# Patient Record
Sex: Male | Born: 1935 | Race: White | Hispanic: No | Marital: Married | Smoking: Never smoker
Health system: Southern US, Community
[De-identification: ages and names within clinical notes are randomized; demographics above are authoritative.]

## PROBLEM LIST (undated history)

## (undated) DIAGNOSIS — K5790 Diverticulosis of intestine, part unspecified, without perforation or abscess without bleeding: Secondary | ICD-10-CM

## (undated) DIAGNOSIS — Z950 Presence of cardiac pacemaker: Secondary | ICD-10-CM

## (undated) DIAGNOSIS — I10 Essential (primary) hypertension: Secondary | ICD-10-CM

## (undated) DIAGNOSIS — Z7901 Long term (current) use of anticoagulants: Secondary | ICD-10-CM

## (undated) DIAGNOSIS — N200 Calculus of kidney: Secondary | ICD-10-CM

## (undated) DIAGNOSIS — F32A Depression, unspecified: Secondary | ICD-10-CM

## (undated) DIAGNOSIS — M1990 Unspecified osteoarthritis, unspecified site: Secondary | ICD-10-CM

## (undated) DIAGNOSIS — I480 Paroxysmal atrial fibrillation: Secondary | ICD-10-CM

## (undated) DIAGNOSIS — K4090 Unilateral inguinal hernia, without obstruction or gangrene, not specified as recurrent: Secondary | ICD-10-CM

## (undated) DIAGNOSIS — I77810 Thoracic aortic ectasia: Secondary | ICD-10-CM

## (undated) DIAGNOSIS — E785 Hyperlipidemia, unspecified: Secondary | ICD-10-CM

## (undated) DIAGNOSIS — I4891 Unspecified atrial fibrillation: Secondary | ICD-10-CM

## (undated) DIAGNOSIS — N400 Enlarged prostate without lower urinary tract symptoms: Secondary | ICD-10-CM

## (undated) DIAGNOSIS — R1313 Dysphagia, pharyngeal phase: Secondary | ICD-10-CM

## (undated) DIAGNOSIS — I700 Atherosclerosis of aorta: Secondary | ICD-10-CM

## (undated) DIAGNOSIS — I4729 Other ventricular tachycardia: Secondary | ICD-10-CM

## (undated) DIAGNOSIS — K219 Gastro-esophageal reflux disease without esophagitis: Secondary | ICD-10-CM

## (undated) DIAGNOSIS — I509 Heart failure, unspecified: Secondary | ICD-10-CM

## (undated) DIAGNOSIS — L309 Dermatitis, unspecified: Secondary | ICD-10-CM

## (undated) DIAGNOSIS — I4510 Unspecified right bundle-branch block: Secondary | ICD-10-CM

## (undated) DIAGNOSIS — I2510 Atherosclerotic heart disease of native coronary artery without angina pectoris: Secondary | ICD-10-CM

## (undated) HISTORY — DX: Dermatitis, unspecified: L30.9

## (undated) HISTORY — DX: Atherosclerotic heart disease of native coronary artery without angina pectoris: I25.10

## (undated) HISTORY — PX: BLEPHAROPLASTY: SUR158

## (undated) HISTORY — PX: MANDIBLE SURGERY: SHX707

## (undated) HISTORY — DX: Essential (primary) hypertension: I10

## (undated) HISTORY — DX: Paroxysmal atrial fibrillation: I48.0

## (undated) HISTORY — DX: Heart failure, unspecified: I50.9

## (undated) HISTORY — DX: Unspecified osteoarthritis, unspecified site: M19.90

## (undated) HISTORY — DX: Hyperlipidemia, unspecified: E78.5

## (undated) HISTORY — PX: TOTAL SHOULDER REPLACEMENT: SUR1217

## (undated) HISTORY — PX: CHOLECYSTECTOMY: SHX55

## (undated) HISTORY — DX: Presence of cardiac pacemaker: Z95.0

## (undated) HISTORY — DX: Unspecified atrial fibrillation: I48.91

## (undated) HISTORY — PX: TONSILLECTOMY: SUR1361

## (undated) HISTORY — PX: HERNIA REPAIR: SHX51

---

## 1964-09-24 HISTORY — PX: MANDIBLE SURGERY: SHX707

## 1975-09-25 HISTORY — PX: HERNIA REPAIR: SHX51

## 1975-09-25 HISTORY — PX: INGUINAL HERNIA REPAIR: SHX194

## 1995-09-25 HISTORY — PX: CARDIAC CATHETERIZATION: SHX172

## 1995-09-25 HISTORY — PX: CORONARY ANGIOPLASTY: SHX604

## 1998-09-24 HISTORY — PX: CARDIAC CATHETERIZATION: SHX172

## 2005-09-24 DIAGNOSIS — Z950 Presence of cardiac pacemaker: Secondary | ICD-10-CM

## 2005-09-24 DIAGNOSIS — I495 Sick sinus syndrome: Secondary | ICD-10-CM

## 2005-09-24 HISTORY — DX: Presence of cardiac pacemaker: Z95.0

## 2005-09-24 HISTORY — DX: Sick sinus syndrome: I49.5

## 2005-12-04 DIAGNOSIS — IMO0001 Reserved for inherently not codable concepts without codable children: Secondary | ICD-10-CM | POA: Insufficient documentation

## 2005-12-23 HISTORY — PX: INSERT / REPLACE / REMOVE PACEMAKER: SUR710

## 2010-09-24 HISTORY — PX: COLONOSCOPY: SHX174

## 2010-10-25 DIAGNOSIS — Z85828 Personal history of other malignant neoplasm of skin: Secondary | ICD-10-CM | POA: Insufficient documentation

## 2010-11-29 DIAGNOSIS — M1710 Unilateral primary osteoarthritis, unspecified knee: Secondary | ICD-10-CM | POA: Insufficient documentation

## 2012-09-05 DIAGNOSIS — H524 Presbyopia: Secondary | ICD-10-CM | POA: Insufficient documentation

## 2012-09-05 DIAGNOSIS — H43399 Other vitreous opacities, unspecified eye: Secondary | ICD-10-CM | POA: Insufficient documentation

## 2012-09-05 DIAGNOSIS — H04129 Dry eye syndrome of unspecified lacrimal gland: Secondary | ICD-10-CM | POA: Insufficient documentation

## 2012-09-05 DIAGNOSIS — H5210 Myopia, unspecified eye: Secondary | ICD-10-CM | POA: Insufficient documentation

## 2013-01-20 DIAGNOSIS — I872 Venous insufficiency (chronic) (peripheral): Secondary | ICD-10-CM | POA: Insufficient documentation

## 2013-01-20 DIAGNOSIS — F32A Depression, unspecified: Secondary | ICD-10-CM | POA: Insufficient documentation

## 2013-03-04 ENCOUNTER — Emergency Department: Payer: Self-pay | Admitting: Internal Medicine

## 2013-03-04 LAB — COMPREHENSIVE METABOLIC PANEL
Alkaline Phosphatase: 81 U/L (ref 50–136)
Anion Gap: 6 — ABNORMAL LOW (ref 7–16)
Bilirubin,Total: 1.4 mg/dL — ABNORMAL HIGH (ref 0.2–1.0)
Chloride: 106 mmol/L (ref 98–107)
Co2: 27 mmol/L (ref 21–32)
Creatinine: 1.07 mg/dL (ref 0.60–1.30)
Glucose: 93 mg/dL (ref 65–99)
Osmolality: 279 (ref 275–301)
SGPT (ALT): 35 U/L (ref 12–78)
Total Protein: 7 g/dL (ref 6.4–8.2)

## 2013-03-04 LAB — CBC
HGB: 15.2 g/dL (ref 13.0–18.0)
MCH: 33.5 pg (ref 26.0–34.0)
MCHC: 35 g/dL (ref 32.0–36.0)
Platelet: 220 10*3/uL (ref 150–440)
RBC: 4.55 10*6/uL (ref 4.40–5.90)

## 2013-03-04 LAB — URINALYSIS, COMPLETE
Bacteria: NONE SEEN
Nitrite: NEGATIVE
Ph: 6 (ref 4.5–8.0)
Protein: NEGATIVE
Specific Gravity: 1.014 (ref 1.003–1.030)
Squamous Epithelial: NONE SEEN
WBC UR: 2 /HPF (ref 0–5)

## 2013-03-04 LAB — CK TOTAL AND CKMB (NOT AT ARMC): CK-MB: 1.3 ng/mL (ref 0.5–3.6)

## 2013-03-12 ENCOUNTER — Ambulatory Visit (INDEPENDENT_AMBULATORY_CARE_PROVIDER_SITE_OTHER): Payer: Medicare Other | Admitting: Cardiovascular Disease

## 2013-03-12 ENCOUNTER — Encounter: Payer: Self-pay | Admitting: Cardiovascular Disease

## 2013-03-12 VITALS — BP 110/80 | HR 72 | Ht 72.0 in | Wt 187.2 lb

## 2013-03-12 DIAGNOSIS — I10 Essential (primary) hypertension: Secondary | ICD-10-CM

## 2013-03-12 DIAGNOSIS — Z950 Presence of cardiac pacemaker: Secondary | ICD-10-CM

## 2013-03-12 DIAGNOSIS — I4891 Unspecified atrial fibrillation: Secondary | ICD-10-CM | POA: Insufficient documentation

## 2013-03-12 DIAGNOSIS — R0602 Shortness of breath: Secondary | ICD-10-CM | POA: Insufficient documentation

## 2013-03-12 DIAGNOSIS — I2510 Atherosclerotic heart disease of native coronary artery without angina pectoris: Secondary | ICD-10-CM | POA: Insufficient documentation

## 2013-03-12 DIAGNOSIS — E785 Hyperlipidemia, unspecified: Secondary | ICD-10-CM | POA: Insufficient documentation

## 2013-03-12 DIAGNOSIS — I25118 Atherosclerotic heart disease of native coronary artery with other forms of angina pectoris: Secondary | ICD-10-CM | POA: Insufficient documentation

## 2013-03-12 NOTE — Assessment & Plan Note (Signed)
He does have mild shortness of breath. Wife does not seem to 3 concerned with his symptoms. We have encouraged him to stay on his Lasix as he does have continued pitting edema of bilateral lower extremities consistent with diastolic CHF. I've asked him to watch his fluid intake. If short of breath symptoms get worse or do not improve to their baseline, options would include stress testing, repeat echocardiogram. We have discussed this with him. He will call us if symptoms get worse.

## 2013-03-12 NOTE — Progress Notes (Signed)
Patient ID: Kyle Morales, male    DOB: 1936-04-20, 77 y.o.   MRN: [id number]  HPI Comments: [name] is a very pleasant 77 year old gentleman, patient of Dr. Ferdinand Cava, who lives at [facility] [place], who presents to establish care in the office. He is a history of paroxysmal atrial fibrillation dating back to 63. He had diagnosis of coronary artery disease and stent placement at [facility] in 1997, pacemaker placement in [place] in 2007 for diagnosis of atrial fibrillation, heart block. At that time ejection fraction was normal.   He states that he continues to have paroxysmal atrial fibrillation. On last EP evaluation in December 2013 at [facility], he had 3.1% atrial fibrillation burden, increased from 1.4%. He has been on the Toprol for a long time. Notes also indicate history of nonsustained VT. Metoprolol dose was increased from 25-100 mg daily.  Last stress test was 3-4 years ago. He is unaware of the results but believes it was fine/ In May he was traveling, could not take his medications on regular basis and developed significant lower extremity edema. 03/04/2013 he presented to the emergency room atrial fibrillation, palpitations. He was given IV fluids and he believes that this converted him back to normal sinus rhythm. His biggest concern is the CT scan showing right lower lobe pneumonia versus mass lesion. He was given several days of antibiotics and instructed to have followup imaging for this finding in the lower lobe.  He has been back on his Lasix after stopping the medication through may. Edema has improved but was significant at the end of May. He continues to have edema worse on the right than the left. He denies any further episodes of atrial fibrillation since his recent hospital visit June 11. He continues to have mild shortness of breath. He feels that this is a chronic issue, possibly Mildly worse than his baseline. He is uncertain if this is from a cardiac issue or from not being on his  Lasix through may 2014.  Notes from [facility] indicate stent placed to his ramus Stress test June 2009 with a normal study, ejection fraction 65%  EKG today shows normal sinus rhythm with right bundle branch block, APCs, nonspecific T wave abnormality in the anterior precordial leads   Outpatient Encounter Prescriptions as of 03/12/2013  Medication Sig Dispense Refill  . aspirin 81 MG tablet Take 81 mg by mouth daily.      . Furosemide (LASIX PO) Take 120 mg by mouth daily.       . Losartan Potassium (COZAAR PO) Take 100 mg by mouth daily.       Marland Kitchen lovastatin (MEVACOR) 20 MG tablet Take 20 mg by mouth at bedtime.      . metoprolol succinate (TOPROL-XL) 100 MG 24 hr tablet Take 100 mg by mouth daily. Take with or immediately following a meal.      . Multiple Vitamin (MULTIVITAMIN) tablet Take 1 tablet by mouth daily.      . potassium chloride (K-DUR) 10 MEQ tablet Take 20 mEq by mouth daily.       . Warfarin Sodium (COUMADIN PO) Take 3 mg by mouth as directed.       . [DISCONTINUED] PRAVASTATIN SODIUM PO Take by mouth.       No facility-administered encounter medications on file as of 03/12/2013.     Review of Systems  Constitutional: Negative.   HENT: Negative.   Eyes: Negative.   Respiratory: Positive for shortness of breath.   Cardiovascular: Negative.  Gastrointestinal: Negative.   Musculoskeletal: Negative.   Skin: Negative.   Neurological: Negative.   Psychiatric/Behavioral: Negative.   All other systems reviewed and are negative.    BP 110/80  Pulse 72  Ht 6' (1.829 m)  Wt 187 lb 4 oz (84.936 kg)  BMI 25.39 kg/m2  Physical Exam  Nursing note and vitals reviewed. Constitutional: He is oriented to person, place, and time. He appears well-developed and well-nourished.  Elderly gentleman  HENT:  Head: Normocephalic.  Nose: Nose normal.  Mouth/Throat: Oropharynx is clear and moist.  Eyes: Conjunctivae are normal. Pupils are equal, round, and reactive to light.  Neck:  Normal range of motion. Neck supple. No JVD present.  Cardiovascular: Normal rate, regular rhythm, S1 normal, S2 normal and intact distal pulses.  Exam reveals no gallop and no friction rub.   Murmur heard.  Systolic murmur is present with a grade of 2/6  Pulmonary/Chest: Effort normal and breath sounds normal. No respiratory distress. He has no wheezes. He has no rales. He exhibits no tenderness.  Mild rales at the bases  Abdominal: Soft. Bowel sounds are normal. He exhibits no distension. There is no tenderness.  Musculoskeletal: Normal range of motion. He exhibits edema. He exhibits no tenderness.  Lymphadenopathy:    He has no cervical adenopathy.  Neurological: He is alert and oriented to person, place, and time. Coordination normal.  Skin: Skin is warm and dry. No rash noted. No erythema.  Psychiatric: He has a normal mood and affect. His behavior is normal. Judgment and thought content normal.      Assessment and Plan

## 2013-03-12 NOTE — Assessment & Plan Note (Signed)
Recent episode of atrial fibrillation. Would recommend he stay in close followup with EP whether this is at [facility] or in [place]. Given underlying CAD in the past, and history of nonsustained VT , he  may be a candidate for other antiarrhythmics in addition to his metoprolol.

## 2013-03-12 NOTE — Patient Instructions (Addendum)
You are doing well. No medication changes were made.  We will try to obtain records from [facility]  Please call us if you have new issues that need to be addressed before your next appt.  Your physician wants you to follow-up in: 6 months.  You will receive a reminder letter in the mail two months in advance. If you don't receive a letter, please call our office to schedule the follow-up appointment.

## 2013-03-12 NOTE — Assessment & Plan Note (Signed)
Blood pressure is well controlled on today's visit. No changes made to the medications. 

## 2013-03-12 NOTE — Assessment & Plan Note (Signed)
Notes indicate prior stent to the ramus. We have suggested if he has worsening symptoms of shortness of breath or other concerns, repeat stress testing could be performed to exclude underlying coronary disease/ischemia.

## 2013-03-12 NOTE — Assessment & Plan Note (Signed)
Goal LDL less than 70, total cholesterol less than 150 given underlying CAD.

## 2013-03-12 NOTE — Assessment & Plan Note (Signed)
He will discuss this with Dr. Alvester Morin and let us know if you would like to establish care with EP in our office.

## 2013-04-29 ENCOUNTER — Other Ambulatory Visit: Payer: Self-pay

## 2013-06-02 DIAGNOSIS — R42 Dizziness and giddiness: Secondary | ICD-10-CM | POA: Insufficient documentation

## 2013-07-30 ENCOUNTER — Other Ambulatory Visit: Payer: Self-pay

## 2013-09-08 ENCOUNTER — Encounter: Payer: Self-pay | Admitting: Cardiovascular Disease

## 2013-09-08 ENCOUNTER — Ambulatory Visit (INDEPENDENT_AMBULATORY_CARE_PROVIDER_SITE_OTHER): Payer: Medicare Other | Admitting: Cardiovascular Disease

## 2013-09-08 VITALS — BP 110/70 | HR 87 | Ht 71.0 in | Wt 188.2 lb

## 2013-09-08 DIAGNOSIS — I10 Essential (primary) hypertension: Secondary | ICD-10-CM

## 2013-09-08 DIAGNOSIS — Z950 Presence of cardiac pacemaker: Secondary | ICD-10-CM

## 2013-09-08 DIAGNOSIS — I5032 Chronic diastolic (congestive) heart failure: Secondary | ICD-10-CM

## 2013-09-08 DIAGNOSIS — I509 Heart failure, unspecified: Secondary | ICD-10-CM

## 2013-09-08 DIAGNOSIS — E785 Hyperlipidemia, unspecified: Secondary | ICD-10-CM

## 2013-09-08 DIAGNOSIS — I4891 Unspecified atrial fibrillation: Secondary | ICD-10-CM

## 2013-09-08 DIAGNOSIS — I2510 Atherosclerotic heart disease of native coronary artery without angina pectoris: Secondary | ICD-10-CM

## 2013-09-08 MED ORDER — LOSARTAN POTASSIUM 50 MG PO TABS: 50.0000 mg | ORAL_TABLET | Freq: Every day | ORAL | Status: DC

## 2013-09-08 NOTE — Assessment & Plan Note (Signed)
Encouraged him to stay on his metoprolol succinate 50 mg twice a day. No recent arrhythmia episodes per the patient.

## 2013-09-08 NOTE — Assessment & Plan Note (Signed)
With recent weight loss, blood pressure running on the low end. Suggested he cut his losartan in half. Recent balance issues. We'll make sure he is not overmedicated.

## 2013-09-08 NOTE — Assessment & Plan Note (Signed)
Cholesterol is at goal on the current lipid regimen. No changes to the medications were made.  

## 2013-09-08 NOTE — Assessment & Plan Note (Signed)
Followed at [facility] 

## 2013-09-08 NOTE — Progress Notes (Signed)
Patient ID: Kyle Morales, male    DOB: 09/30/1935, 77 y.o.   MRN: [id number]  HPI Comments: Kyle Morales is a very pleasant 77 year old gentleman, patient of Dr. Ferdinand Cava, who lives at [facility] [place], who presents for routine followup. history of paroxysmal atrial fibrillation dating back to 89.  coronary artery disease with stent placement at [facility] in 1997, pacemaker placement in [place] in 2007 for diagnosis of atrial fibrillation, heart block. At that time ejection fraction was normal.   On his last clinic visit, metoprolol succinate changed to 50 mg twice a day.  In followup today, he reports that he is doing well. He had episode of atrial fibrillation August 2014. No further episodes since that time. He has no complaints. He is trying to lose weight and has lost 6 or 7 pounds by watching his diet. He is not doing a regular exercise program, has found his balance is poor. Especially weak in the morning and sits on the side of the bed for several minutes before standing.   On  EP evaluation in December 2013 at [facility], he had 3.1% atrial fibrillation burden, increased from 1.4%. He has been on the Toprol for a long time. Notes also indicate history of nonsustained VT. Metoprolol dose was increased from 25-100 mg daily.  Last stress test was 3-4 years ago.  In May 2014 he was traveling, could not take his medications on regular basis and developed significant lower extremity edema. 03/04/2013 he presented to the emergency room atrial fibrillation, palpitations. He was given IV fluids and he believes that this converted him back to normal sinus rhythm. His biggest concern is the CT scan showing right lower lobe pneumonia versus mass lesion. He was given several days of antibiotics and instructed to have followup imaging for this finding in the lower lobe.  Notes from [facility] indicate stent placed to his ramus Stress test June 2009 with a normal study, ejection fraction 65%  Total cholesterol 129, LDL  67, HDL 29  EKG today shows normal sinus rhythm with rate 87 beats per minute,  right bundle branch block, APCs, nonspecific T wave abnormality in the anterior precordial leads   Outpatient Encounter Prescriptions as of 09/08/2013  Medication Sig  . aspirin 81 MG tablet Take 81 mg by mouth daily.  . AZITHROMYCIN PO Take by mouth. Takes 2 tablets daily.  . Furosemide (LASIX PO) Take 120 mg by mouth daily.   . Losartan Potassium (COZAAR PO) Take 100 mg by mouth daily.   Marland Kitchen lovastatin (MEVACOR) 20 MG tablet Take 20 mg by mouth at bedtime.  . metoprolol succinate (TOPROL-XL) 50 MG 24 hr tablet Take 50 mg by mouth 2 (two) times daily. Take with or immediately following a meal.  . Multiple Vitamin (MULTIVITAMIN) tablet Take 1 tablet by mouth daily.  . potassium chloride (K-DUR) 10 MEQ tablet Take 20 mEq by mouth daily.   . Warfarin Sodium (COUMADIN PO) Take 3 mg by mouth as directed.   . [DISCONTINUED] metoprolol succinate (TOPROL-XL) 100 MG 24 hr tablet Take 100 mg by mouth daily. Take with or immediately following a meal.     Review of Systems  Constitutional: Negative.   HENT: Negative.   Eyes: Negative.   Cardiovascular: Negative.   Gastrointestinal: Negative.   Endocrine: Negative.   Musculoskeletal: Positive for gait problem.  Skin: Negative.   Allergic/Immunologic: Negative.   Neurological: Negative.   Hematological: Negative.   Psychiatric/Behavioral: Negative.   All other systems reviewed and are negative.  BP 110/70  Pulse 87  Ht 5\' 11"  (1.803 m)  Wt 188 lb 4 oz (85.39 kg)  BMI 26.27 kg/m2  Physical Exam  Nursing note and vitals reviewed. Constitutional: He is oriented to person, place, and time. He appears well-developed and well-nourished.  Elderly gentleman  HENT:  Head: Normocephalic.  Nose: Nose normal.  Mouth/Throat: Oropharynx is clear and moist.  Eyes: Conjunctivae are normal. Pupils are equal, round, and reactive to light.  Neck: Normal range of  motion. Neck supple. No JVD present.  Cardiovascular: Normal rate, regular rhythm, S1 normal, S2 normal and intact distal pulses.  Exam reveals no gallop and no friction rub.   Murmur heard.  Systolic murmur is present with a grade of 2/6  Pulmonary/Chest: Effort normal and breath sounds normal. No respiratory distress. He has no wheezes. He has no rales. He exhibits no tenderness.  Abdominal: Soft. Bowel sounds are normal. He exhibits no distension. There is no tenderness.  Musculoskeletal: Normal range of motion. He exhibits edema. He exhibits no tenderness.  Lymphadenopathy:    He has no cervical adenopathy.  Neurological: He is alert and oriented to person, place, and time. Coordination normal.  Skin: Skin is warm and dry. No rash noted. No erythema.  Psychiatric: He has a normal mood and affect. His behavior is normal. Judgment and thought content normal.      Assessment and Plan

## 2013-09-08 NOTE — Assessment & Plan Note (Signed)
Currently with no symptoms of angina. No further workup at this time. Continue current medication regimen. 

## 2013-09-08 NOTE — Patient Instructions (Addendum)
You are doing well. Please cut the losartan in 1/2 daily Monitor the blood pressure at home Call the office if it runs low.  If you have atrial fibrillation epsiode, take an extra 1/2 metoprolol  We will check blood work today  Please call us if you have new issues that need to be addressed before your next appt.  Your physician wants you to follow-up in: 6 months.  You will receive a reminder letter in the mail two months in advance. If you don't receive a letter, please call our office to schedule the follow-up appointment.

## 2013-09-09 LAB — BASIC METABOLIC PANEL
BUN/Creatinine Ratio: 16 (ref 10–22)
Creatinine, Ser: 1.06 mg/dL (ref 0.76–1.27)
GFR calc non Af Amer: 68 mL/min/{1.73_m2} (ref >59)
Potassium: 4.2 mmol/L (ref 3.5–5.2)
Sodium: 145 mmol/L — ABNORMAL HIGH (ref 134–144)

## 2013-12-08 DIAGNOSIS — M7989 Other specified soft tissue disorders: Secondary | ICD-10-CM | POA: Insufficient documentation

## 2014-04-28 ENCOUNTER — Ambulatory Visit (INDEPENDENT_AMBULATORY_CARE_PROVIDER_SITE_OTHER): Payer: Medicare Other | Admitting: Cardiovascular Disease

## 2014-04-28 ENCOUNTER — Encounter: Payer: Self-pay | Admitting: Cardiovascular Disease

## 2014-04-28 VITALS — BP 98/72 | HR 76 | Ht 72.0 in | Wt 192.8 lb

## 2014-04-28 DIAGNOSIS — Z950 Presence of cardiac pacemaker: Secondary | ICD-10-CM

## 2014-04-28 DIAGNOSIS — E785 Hyperlipidemia, unspecified: Secondary | ICD-10-CM

## 2014-04-28 DIAGNOSIS — I10 Essential (primary) hypertension: Secondary | ICD-10-CM

## 2014-04-28 DIAGNOSIS — I951 Orthostatic hypotension: Secondary | ICD-10-CM | POA: Insufficient documentation

## 2014-04-28 DIAGNOSIS — I4891 Unspecified atrial fibrillation: Secondary | ICD-10-CM

## 2014-04-28 DIAGNOSIS — I2510 Atherosclerotic heart disease of native coronary artery without angina pectoris: Secondary | ICD-10-CM

## 2014-04-28 NOTE — Assessment & Plan Note (Signed)
Suggested he continue on his lovastatin. Prior blood work showing he was at goal

## 2014-04-28 NOTE — Assessment & Plan Note (Signed)
Currently with no symptoms of angina. No further workup at this time. Continue current medication regimen. 

## 2014-04-28 NOTE — Assessment & Plan Note (Signed)
Tolerating high-dose metoprolol. Heart rate reasonably well controlled. No symptoms concerning for arrhythmia

## 2014-04-28 NOTE — Assessment & Plan Note (Signed)
Reports that [facility]

## 2014-04-28 NOTE — Progress Notes (Signed)
Patient ID: Kyle Morales, male    DOB: 10-13-1935, 78 y.o.   MRN: [id number]  HPI Comments: [name] is a very pleasant 78 year old gentleman, patient of Dr. Ferdinand CavaWarren Morales, who lives at [facility] [place], who presents for routine followup. history of paroxysmal atrial fibrillation dating back to [id number].  coronary artery disease with stent [facility] in 1997, pacemaker placement in [place] in 2007 for diagnosis of atrial fibrillation, heart block. At that time ejection fraction was normal.   In followup today, he reports that he is taking metoprolol succinate 100 mg in the morning and 50 mg at night He states that this was done for a heavy feeling that he was having in his chest. Since the medication change, this heavy feeling has resolved. Reports that he drinks a reasonable amount of fluids in the daytime. He takes Lasix 120 mg in the morning, sometimes takes an additional Lasix 40 mg in the p.m. He has been recording his blood pressure at home and typically this runs 90s systolic. Some orthostasis in the mornings. He has trace edema in his right ankle, more than his left ankle. Denies any tachycardia or palpitations concerning for atrial fibrillation   He had episode of atrial fibrillation August 2014. No further episodes since that time.  He is not doing a regular exercise program, has found his balance is poor.    On  [facility] in December [facility], he had 3.1% atrial fibrillation burden, increased from 1.4%. He has been on the Toprol for a long time. Notes also indicate history of nonsustained VT. Metoprolol dose was increased from 25-100 mg daily.  Last stress test was 3-4 years ago.  In May 2014 he was traveling, could not take his medications on regular basis and developed significant lower extremity edema. 03/04/2013 he presented to the emergency room atrial fibrillation, palpitations. He was given IV fluids and he believes that this converted him back to normal sinus rhythm. His  biggest concern is the CT scan showing right lower lobe pneumonia versus mass lesion. He was given several days of antibiotics and instructed to have followup imaging for this finding in the lower lobe.  [facility] stent placed to his ramus Stress test June 2009 with a normal study, ejection fraction 65%  EKG today shows normal sinus rhythm with rate 76 beats per minute,  right bundle branch block, nonspecific T wave abnormality in the anterior precordial leads   Outpatient Encounter Prescriptions as of 04/28/2014  Medication Sig  . aspirin 81 MG tablet Take 81 mg by mouth daily.  . Furosemide (LASIX PO) Take 120 mg by mouth daily.   Marland Kitchen. losartan (COZAAR) 50 MG tablet Take 1 tablet (50 mg total) by mouth daily.  Marland Kitchen. lovastatin (MEVACOR) 20 MG tablet Take 20 mg by mouth at bedtime.  . metoprolol succinate (TOPROL-XL) 50 MG 24 hr tablet Take two tablets in the am and one tablet in the pm.  . Multiple Vitamin (MULTIVITAMIN) tablet Take 1 tablet by mouth daily.  . potassium chloride (K-DUR) 10 MEQ tablet Take 20 mEq by mouth daily.   . Warfarin Sodium (COUMADIN PO) Take 3 mg by mouth as directed.     Review of Systems  Constitutional: Negative.   HENT: Negative.   Eyes: Negative.   Respiratory: Negative.   Cardiovascular: Negative.   Gastrointestinal: Negative.   Endocrine: Negative.   Musculoskeletal: Positive for gait problem.  Skin: Negative.   Allergic/Immunologic: Negative.   Neurological: Negative.  Hematological: Negative.   Psychiatric/Behavioral: Negative.   All other systems reviewed and are negative.   BP 98/72  Pulse 76  Ht 6' (1.829 m)  Wt 192 lb 12 oz (87.431 kg)  BMI 26.14 kg/m2 Repeat of his blood pressure showed systolic in the 90s Physical Exam  Nursing note and vitals reviewed. Constitutional: He is oriented to person, place, and time. He appears well-developed and well-nourished.  Elderly gentleman  HENT:  Head: Normocephalic.  Nose: Nose  normal.  Mouth/Throat: Oropharynx is clear and moist.  Eyes: Conjunctivae are normal. Pupils are equal, round, and reactive to light.  Neck: Normal range of motion. Neck supple. No JVD present.  Cardiovascular: Normal rate, regular rhythm, S1 normal, S2 normal and intact distal pulses.  Exam reveals no gallop and no friction rub.   Murmur heard.  Systolic murmur is present with a grade of 2/6  Pulmonary/Chest: Effort normal and breath sounds normal. No respiratory distress. He has no wheezes. He has no rales. He exhibits no tenderness.  Abdominal: Soft. Bowel sounds are normal. He exhibits no distension. There is no tenderness.  Musculoskeletal: Normal range of motion. He exhibits edema. He exhibits no tenderness.  Lymphadenopathy:    He has no cervical adenopathy.  Neurological: He is alert and oriented to person, place, and time. Coordination normal.  Skin: Skin is warm and dry. No rash noted. No erythema.  Psychiatric: He has a normal mood and affect. His behavior is normal. Judgment and thought content normal.      Assessment and Plan

## 2014-04-28 NOTE — Assessment & Plan Note (Signed)
Blood pressures running very low on today's visit. Some orthostasis. His systolic blood pressures at home running in the 90s. We have suggested he hold his losartan and continue to monitor his blood pressure at home.

## 2014-04-28 NOTE — Assessment & Plan Note (Signed)
Losartan held on today's visit. If systolic continues to run in the 90s, we may need to cut back on his diuretics. Most recent lab work not available. Other option would be to cut back on his morning metoprolol, back to 50 mg twice a day

## 2014-04-28 NOTE — Patient Instructions (Signed)
You are doing well.  Blood pressure is very low Please hold the losartan Monitor your blood pressure Top number on the blood pressure should be >110 Please call the office if it continues to run less than 100 on the top number  Please call us if you have new issues that need to be addressed before your next appt.  Your physician wants you to follow-up in: 6 months.  You will receive a reminder letter in the mail two months in advance. If you don't receive a letter, please call our office to schedule the follow-up appointment.

## 2014-05-11 DIAGNOSIS — R200 Anesthesia of skin: Secondary | ICD-10-CM | POA: Insufficient documentation

## 2014-06-14 ENCOUNTER — Inpatient Hospital Stay: Payer: Self-pay | Admitting: Internal Medicine

## 2014-06-14 LAB — PROTIME-INR
INR: 2.3
Prothrombin Time: 25.1 secs — ABNORMAL HIGH (ref 11.5–14.7)

## 2014-06-14 LAB — URINALYSIS, COMPLETE
BILIRUBIN, UR: NEGATIVE
Bacteria: NONE SEEN
Glucose,UR: NEGATIVE mg/dL (ref 0–75)
Ketone: NEGATIVE
LEUKOCYTE ESTERASE: NEGATIVE
NITRITE: NEGATIVE
Ph: 5 (ref 4.5–8.0)
Protein: NEGATIVE
RBC,UR: 3 /HPF (ref 0–5)
Specific Gravity: 1.02 (ref 1.003–1.030)
Squamous Epithelial: <1

## 2014-06-14 LAB — CBC
HCT: 53.3 % — ABNORMAL HIGH (ref 40.0–52.0)
HGB: 17.5 g/dL (ref 13.0–18.0)
MCH: 32.3 pg (ref 26.0–34.0)
MCHC: 32.7 g/dL (ref 32.0–36.0)
MCV: 99 fL (ref 80–100)
Platelet: 182 10*3/uL (ref 150–440)
RBC: 5.41 10*6/uL (ref 4.40–5.90)
RDW: 13.4 % (ref 11.5–14.5)
WBC: 10.3 10*3/uL (ref 3.8–10.6)

## 2014-06-14 LAB — CK-MB: CK-MB: 3.2 ng/mL (ref 0.5–3.6)

## 2014-06-14 LAB — CK TOTAL AND CKMB (NOT AT ARMC)
CK, TOTAL: 128 U/L
CK-MB: 3.1 ng/mL (ref 0.5–3.6)

## 2014-06-14 LAB — CLOSTRIDIUM DIFFICILE(ARMC)

## 2014-06-14 LAB — TROPONIN I: TROPONIN-I: 0.12 ng/mL — AB

## 2014-06-15 ENCOUNTER — Other Ambulatory Visit: Payer: Self-pay | Admitting: Physician Assistant

## 2014-06-15 DIAGNOSIS — I4891 Unspecified atrial fibrillation: Secondary | ICD-10-CM

## 2014-06-15 DIAGNOSIS — I2510 Atherosclerotic heart disease of native coronary artery without angina pectoris: Secondary | ICD-10-CM

## 2014-06-15 LAB — CBC WITH DIFFERENTIAL/PLATELET
Basophil #: 0 10*3/uL (ref 0.0–0.1)
Basophil %: 0.5 %
Eosinophil #: 0.6 10*3/uL (ref 0.0–0.7)
Eosinophil %: 8.7 %
HCT: 45.3 % (ref 40.0–52.0)
HGB: 15.3 g/dL (ref 13.0–18.0)
LYMPHS ABS: 1.8 10*3/uL (ref 1.0–3.6)
LYMPHS PCT: 25.1 %
MCH: 33.1 pg (ref 26.0–34.0)
MCHC: 33.8 g/dL (ref 32.0–36.0)
MCV: 98 fL (ref 80–100)
MONO ABS: 0.6 x10 3/mm (ref 0.2–1.0)
Monocyte %: 8.5 %
NEUTROS ABS: 4.1 10*3/uL (ref 1.4–6.5)
Neutrophil %: 57.2 %
Platelet: 146 10*3/uL — ABNORMAL LOW (ref 150–440)
RBC: 4.62 10*6/uL (ref 4.40–5.90)
RDW: 13.6 % (ref 11.5–14.5)
WBC: 7.1 10*3/uL (ref 3.8–10.6)

## 2014-06-15 LAB — COMPREHENSIVE METABOLIC PANEL
ALBUMIN: 4.1 g/dL (ref 3.4–5.0)
AST: 38 U/L — AB (ref 15–37)
Alkaline Phosphatase: 74 U/L
Anion Gap: 9 (ref 7–16)
BUN: 17 mg/dL (ref 7–18)
Bilirubin,Total: 1.1 mg/dL — ABNORMAL HIGH (ref 0.2–1.0)
CALCIUM: 9.2 mg/dL (ref 8.5–10.1)
Chloride: 108 mmol/L — ABNORMAL HIGH (ref 98–107)
Co2: 25 mmol/L (ref 21–32)
Creatinine: 1.16 mg/dL (ref 0.60–1.30)
EGFR (African American): >60
GLUCOSE: 109 mg/dL — AB (ref 65–99)
Osmolality: 285 (ref 275–301)
Potassium: 4.2 mmol/L (ref 3.5–5.1)
SGPT (ALT): 39 U/L
SODIUM: 142 mmol/L (ref 136–145)
Total Protein: 7.7 g/dL (ref 6.4–8.2)

## 2014-06-15 LAB — BASIC METABOLIC PANEL
ANION GAP: 7 (ref 7–16)
BUN: 15 mg/dL (ref 7–18)
CALCIUM: 8.3 mg/dL — AB (ref 8.5–10.1)
CO2: 24 mmol/L (ref 21–32)
Chloride: 112 mmol/L — ABNORMAL HIGH (ref 98–107)
Creatinine: 0.95 mg/dL (ref 0.60–1.30)
EGFR (African American): >60
GLUCOSE: 95 mg/dL (ref 65–99)
OSMOLALITY: 286 (ref 275–301)
Potassium: 3.6 mmol/L (ref 3.5–5.1)
Sodium: 143 mmol/L (ref 136–145)

## 2014-06-15 LAB — LIPID PANEL
CHOLESTEROL: 92 mg/dL (ref 0–200)
HDL: 24 mg/dL — AB (ref 40–60)
LDL CHOLESTEROL, CALC: 39 mg/dL (ref 0–100)
Triglycerides: 144 mg/dL (ref 0–200)
VLDL Cholesterol, Calc: 29 mg/dL (ref 5–40)

## 2014-06-15 LAB — TSH: Thyroid Stimulating Horm: 3.14 u[IU]/mL

## 2014-06-15 LAB — CK-MB: CK-MB: 3.3 ng/mL (ref 0.5–3.6)

## 2014-06-15 LAB — TROPONIN I: Troponin-I: 0.09 ng/mL — ABNORMAL HIGH

## 2014-06-16 LAB — PROTIME-INR
INR: 2.2
PROTHROMBIN TIME: 23.5 s — AB (ref 11.5–14.7)

## 2014-06-17 DIAGNOSIS — I4891 Unspecified atrial fibrillation: Secondary | ICD-10-CM

## 2014-06-17 LAB — WBCS, STOOL

## 2014-06-17 LAB — STOOL CULTURE

## 2014-06-18 ENCOUNTER — Telehealth: Payer: Self-pay

## 2014-06-18 LAB — PROTIME-INR
INR: 2
Prothrombin Time: 22.1 secs — ABNORMAL HIGH (ref 11.5–14.7)

## 2014-06-18 NOTE — Telephone Encounter (Signed)
Spoke w/ pt.  He states that he was discharged from [facility] this am w/ instructions for his amiodarone as follows:  amiodarone 400 mg oral tablet  1 tab(s) orally 2 times a day after 7 days take  200 mg daily    Pt would like clarification, as he is concerned that he is to take  and then drop to cutting the pills in 1/2.  Please advise on correct instructions for pt's rx.

## 2014-06-18 NOTE — Telephone Encounter (Signed)
He needs some 200 mg pills. Please send in amiodarone 200 mg 2 tabs po bid x 1 week, then 1 tab po bid, then 1 tab daily. #[phone] RF 1

## 2014-06-18 NOTE — Telephone Encounter (Signed)
Pt wife called and has as question regarding Amiodarone directions. Please call

## 2014-06-18 NOTE — Telephone Encounter (Signed)
Spoke w/ pt.  Advised him of Ryan's recommendation.  He reports that he has  pills, but was unclear on the initial instructions from the hospital.  He verbalizes understanding and will keep his TCM appt next week.

## 2014-06-25 ENCOUNTER — Encounter: Payer: Self-pay | Admitting: Physician Assistant

## 2014-06-25 ENCOUNTER — Ambulatory Visit (INDEPENDENT_AMBULATORY_CARE_PROVIDER_SITE_OTHER): Payer: Medicare Other | Admitting: Physician Assistant

## 2014-06-25 VITALS — BP 110/82 | HR 79 | Ht 72.0 in | Wt 185.8 lb

## 2014-06-25 DIAGNOSIS — I4891 Unspecified atrial fibrillation: Secondary | ICD-10-CM

## 2014-06-25 DIAGNOSIS — R079 Chest pain, unspecified: Secondary | ICD-10-CM

## 2014-06-25 DIAGNOSIS — I480 Paroxysmal atrial fibrillation: Secondary | ICD-10-CM

## 2014-06-25 DIAGNOSIS — Z950 Presence of cardiac pacemaker: Secondary | ICD-10-CM

## 2014-06-25 DIAGNOSIS — I2510 Atherosclerotic heart disease of native coronary artery without angina pectoris: Secondary | ICD-10-CM

## 2014-06-25 NOTE — Progress Notes (Signed)
Patient Name: Kyle Morales, Kyle Morales 17-Jun-1936, MRN [id number]  Date of Encounter: 06/25/2014  Primary Care Provider:  Ferdinand Cava, MD Primary Cardiologist:  Dr. Mariah Milling, MD  Patient Profile:  78 y.o. male with history of CAD s/p stenting in 1997, paroxysmal a-fib dating back to 1992 s/p Medtronic PPM 2007 for a-fib/CBH, HTN, and HLD here for hospital follow up after admission to [facility] from 9/21-9/25 for substernal chest pain x 1 day and GI illness (diarrhea) x 12 days.    Problem List:   Past Medical History  Diagnosis Date  . Hypertension   . Hyperlipidemia   . CHF (congestive heart failure)   . Palpitations   . A-fib   . Coronary artery disease   . Osteoarthrosis   . Eczema   . Arrhythmia    Past Surgical History  Procedure Laterality Date  . Cardiac catheterization  1997  . Coronary angioplasty  1997    ramus  . Cardiac catheterization  2000  . Insert / replace / remove pacemaker  12/2005    Medtronic [id number] - placed in [place]  . Total shoulder replacement      bilateral   . Hernia repair    . Tonsillectomy    . Mandible surgery    . Colonoscopy  2012     Allergies:  Allergies  Allergen Reactions  . Amoxicillin      HPI:  78 y.o. male with the above problem list who is here for hospital follow up after recent admission to [facility] from 9/21-9/25 for chest pain and diarrhea.   Patient with history of known CAD with notes indicating stenting to the ramus. Last nuclear stress test  04/03/2010: moderate in size, mild inseverity, fixed defect involving the inferolateral segment. This is consistent with probable attenuation artifact. Nuclear Wall Motion Findings: Global systolic function is overall normal. EF 65%. Impression:- There was no evidence for any significant ischemia noted. There was no change when compared to the previous study 03/16/2008. He had PPM placed in [place] in 2007 2/2 sick sinus syndrome. Last device interogation was 12/01/13 with >4hr  a-fib noted, longest episode 10 hrs 10/18/13, VHR tracings  are consistent  with 1:1SVT, while the AF  Episodes appear consistent with more AF. AF burden has shown  a slight decrease  from the 8.6% measured at last remote.  He presented to [facility] 9/21 with complaints of substernal chest pain that began in the morning as well as profusely watery diarrhea that began 12 days prior. Chest pain was characterized as an ache along the middle of his chest that did not radiate. No associated nausea, vomiting, diaphoresis, palpitations, SOB, dyspnea, edema, presyncope, or syncope. Chest pain was relieved with SL NTG. He also noted 8-9 watery stools daily for the past 12 days prior along with some epigastric discomfort and back discomfort initially that have since resolved. No fevers or chills. He denied any recent camping trips or time spent in the woods/outdoors. No BRBPR. Again, no nausea or vomiting.   Upon arrival to the ED on 9/21 for his chest pain he was found to have a HR of 148 and received multiple doses of adenosine without relief of his tachycardia. He also received metoprolol 10 mg IV push and 20 mg po in the ED. Upon his admission to the hospital his HR was in the 80s with a BP 90/80. His labs were significant for K+ 4.2 which trended down to 3.6 this morning, TnI <0.02-->0.12-->0.09, WBC 10.3-->7.1, C diff  negative. He remained chest pain free during his admission. He was started on amiodarone 400 mg bid x 1 week-->200 mg bid-->200 mg daily in an effort to cardiovert him. He converted to NSR for a brief period his first night in the hospital but went back into a-fib and remained in rate controlled a-fib (80s). He was adequately anticoagulated on warfarin at baseline. TSH and HFP normal.   He comes in today feeling well. He finally had a normal bowel movement today, has been having continued intermittent watery diarrhea with associated nausea and diarrhea since his discharge. Back in NSR, 79. Tolerating  amiodarone without issues. No chest pain or palpitations. He [facility] for [facility] 07/09/2014. He has not had follow up with GI since [facility].      Home Medications:  Prior to Admission medications   Medication Sig Start Date End Date Taking? Authorizing Provider  aspirin 81 MG tablet Take 81 mg by mouth daily.    Historical Provider, MD  Furosemide (LASIX PO) Take 120 mg by mouth daily.     Historical Provider, MD  lovastatin (MEVACOR) 20 MG tablet Take 20 mg by mouth at bedtime.    Historical Provider, MD  metoprolol succinate (TOPROL-XL) 50 MG 24 hr tablet Take two tablets in the am and one tablet in the pm.    Historical Provider, MD  Multiple Vitamin (MULTIVITAMIN) tablet Take 1 tablet by mouth daily.    Historical Provider, MD  potassium chloride (K-DUR) 10 MEQ tablet Take 20 mEq by mouth daily.     Historical Provider, MD  Warfarin Sodium (COUMADIN PO) Take 3 mg by mouth as directed.     Historical Provider, MD     Weights: Filed Weights   06/25/14 1316  Weight: 185 lb 12 oz (84.256 kg)     Review of Systems:  All other systems reviewed and are otherwise negative except as noted above.  Physical Exam:  Blood pressure 110/82, pulse 79, height 6' (1.829 m), weight 185 lb 12 oz (84.256 kg).  General: Pleasant, NAD Psych: Normal affect. Neuro: Alert and oriented X 3. Moves all extremities spontaneously. HEENT: Normal  Neck: Supple without bruits or JVD. Lungs:  Resp regular and unlabored, CTA. Heart: RRR no s3, s4, or 1/6 systolic murmur. Abdomen: Soft, non-tender, non-distended, BS + x 4.  Extremities: No clubbing, cyanosis or edema. DP/PT/Radials 2+ and equal bilaterally.   Accessory Clinical Findings:  EKG - NSR, 79, RBBB, new inferior TWI, old nonspecific t wave abnormality anterior leads, normal axis, 1st degree AV block    Assessment & Plan:  1. History of paroxysmal a-fib: -Currently in NSR, 79 -Continue Toprol-XL 50 mg  daily -Continue amiodarone - he would like to come off this medication as soon as possible. I will keep him on for the time being given the pending ischemic evaluation and he is still having GI symptoms. As soon as these issues are put to rest and if he continues to remain in NSR we can revisit discontinuation of his amiodarone  -Currently on warfarin -INR 1.7 on 9/29 per patient, no adjustments by PCP 2/2 he was not on his usual dose while inpatient, which he now is. I have advised him to follow up first of the week with a repeat INR and if still below 2.0 to discuss dose adjustment - discussed stroke risk  2. History of demand ischemia in the setting of GI illness and a-fib with RVR: -New abnormal EKG changes -TnI  mildly elevated at 0.12 then trended down at 0.09 while inpatient at [facility] likely 2/2 supply demand ischemia from GI illness/tachycardia -Plan for Lexiscan Myoview   3. CAD: -S/p stenting 1997 -Continue aspirin, b-blocker, statin  4. Diarrhea: -First formed stool today -Has not yet had GI follow up yet - gave patient Dr. Reyes Ivan name and contact info for hospital follow up  5. Status post PPM for sick sinus syndrome: -Has follow up appointment with [facility] for device check 07/09/2014   Eula Listen, PA-C [facility] [address] [place], [place] [address] [phone] [facility] 06/25/2014, 4:06 PM

## 2014-06-25 NOTE — Patient Instructions (Addendum)
[facility] MYOVIEW  Your caregiver has ordered a Stress Test with nuclear imaging. The purpose of this test is to evaluate the blood supply to your heart muscle. This procedure is referred to as a "Non-Invasive Stress Test." This is because other than having an IV started in your vein, nothing is inserted or "invades" your body. Cardiac stress tests are done to find areas of poor blood flow to the heart by determining the extent of coronary artery disease (CAD). Some patients exercise on a treadmill, which naturally increases the blood flow to your heart, while others who are  unable to walk on a treadmill due to physical limitations have a pharmacologic/chemical stress agent called Lexiscan . This medicine will mimic walking on a treadmill by temporarily increasing your coronary blood flow.   Please note: these test may take anywhere between 2-4 hours to complete  PLEASE REPORT TO [facility] MEDICAL MALL ENTRANCE  THE VOLUNTEERS AT THE FIRST DESK WILL DIRECT YOU WHERE TO [facility]  Date of Procedure:___________10/06/15__________________________  Arrival Time for Procedure:_____0745_________________________     PLEASE NOTIFY THE OFFICE AT LEAST 24 HOURS IN ADVANCE IF YOU ARE UNABLE TO KEEP YOUR APPOINTMENT.  [phone] AND  PLEASE NOTIFY NUCLEAR MEDICINE AT [facility] AT LEAST 24 HOURS IN ADVANCE IF YOU ARE UNABLE TO KEEP YOUR APPOINTMENT. [phone]  How to prepare for your Myoview test:  1. Do not eat or drink after midnight 2. No caffeine for 24 hours prior to test 3. No smoking 24 hours prior to test. 4. Your medication may be taken with water.  If your doctor stopped a medication because of this test, do not take that medication. 5. Ladies, please do not wear dresses.  Skirts or pants are appropriate. Please wear a short sleeve shirt. 6. No perfume, cologne or lotion. 7. Wear comfortable walking shoes. No heels!     Follow up with your PCP for an INR in 1 week   Your physician recommends that you  schedule a follow-up appointment in:  Dr. Mariah MillingGollan in 1 month    Please follow up with Dr. Marva PandaSkulskie as discussed  [phone]

## 2014-06-29 ENCOUNTER — Ambulatory Visit: Payer: Self-pay | Admitting: Physician Assistant

## 2014-06-29 DIAGNOSIS — R079 Chest pain, unspecified: Secondary | ICD-10-CM

## 2014-06-30 ENCOUNTER — Telehealth: Payer: Self-pay | Admitting: *Deleted

## 2014-06-30 ENCOUNTER — Other Ambulatory Visit: Payer: Self-pay

## 2014-06-30 DIAGNOSIS — R079 Chest pain, unspecified: Secondary | ICD-10-CM

## 2014-06-30 NOTE — Telephone Encounter (Signed)
Patient called and wants his stress test results

## 2014-07-01 NOTE — Telephone Encounter (Signed)
See result note.  

## 2014-07-09 ENCOUNTER — Telehealth: Payer: Self-pay

## 2014-07-09 NOTE — Telephone Encounter (Signed)
Pt called states his [facility] at [facility] today was 4.2 , the Dr[facility] At [facility] states the amiodarone was the cause of this. Please call and advise the dosage of Amiodarone.

## 2014-07-11 NOTE — Telephone Encounter (Signed)
Notes indicate he is on amiodarone 200 mg daily This is a very low dose but might contribute to higher INR. In most cases we just warfarin for INR 2-3 Primary care does his warfarin, they can adjust dosing downward to achieve this goal

## 2014-07-12 MED ORDER — AMIODARONE HCL 100 MG PO TABS
100.0000 mg | ORAL_TABLET | Freq: Every day | ORAL | Status: DC
Start: 2014-07-12 — End: 2014-10-20

## 2014-07-12 NOTE — Telephone Encounter (Signed)
Spoke w/ pt's wife.  Advised her of Dr. Windell HummingbirdGollan's recommendation.  She states that [facility] changed his amiodarone dosage to 100 mg daily. Reports that [facility], but they would like to possibly switch this to our office. Asked her to call back w/ any further questions or concerns.

## 2014-07-14 ENCOUNTER — Ambulatory Visit (INDEPENDENT_AMBULATORY_CARE_PROVIDER_SITE_OTHER): Payer: Medicare Other

## 2014-07-14 ENCOUNTER — Telehealth: Payer: Self-pay | Admitting: Cardiovascular Disease

## 2014-07-14 DIAGNOSIS — Z5181 Encounter for therapeutic drug level monitoring: Secondary | ICD-10-CM

## 2014-07-14 DIAGNOSIS — I4891 Unspecified atrial fibrillation: Secondary | ICD-10-CM

## 2014-07-14 LAB — POCT INR: INR: 5.2

## 2014-07-14 NOTE — Telephone Encounter (Signed)
He can be scheduled in the Coumadin clinic but would check the specialty co-pay Sometimes patients find it cheaper for primary care to check this Happy to schedule him in Coumadin clinic if needed

## 2014-07-14 NOTE — Telephone Encounter (Signed)
Pt wife calling asking if husband could get INR checked here, [facility] ordered it but pt cant travel that far, please call patient.

## 2014-07-14 NOTE — Telephone Encounter (Signed)
Spoke w/ pt.  He states that he is scheduled to come this afternoon for coumadin clinic. He states that he has 2 ins so co-pay should not be an issue.

## 2014-07-14 NOTE — Telephone Encounter (Signed)
Pt wife calling asking if husband could get INR checked here, [facility] ordered it but pt cant travel that far, please call patient.  °  °

## 2014-07-15 ENCOUNTER — Emergency Department: Payer: Self-pay | Admitting: Emergency Medicine

## 2014-07-15 LAB — CBC
HCT: 47.1 % (ref 40.0–52.0)
HGB: 15.6 g/dL (ref 13.0–18.0)
MCH: 32.4 pg (ref 26.0–34.0)
MCHC: 33.2 g/dL (ref 32.0–36.0)
MCV: 98 fL (ref 80–100)
PLATELETS: 129 10*3/uL — AB (ref 150–440)
RBC: 4.83 10*6/uL (ref 4.40–5.90)
RDW: 13.2 % (ref 11.5–14.5)
WBC: 5 10*3/uL (ref 3.8–10.6)

## 2014-07-15 LAB — BASIC METABOLIC PANEL
Anion Gap: 9 (ref 7–16)
BUN: 14 mg/dL (ref 7–18)
CHLORIDE: 105 mmol/L (ref 98–107)
CO2: 28 mmol/L (ref 21–32)
CREATININE: 1.19 mg/dL (ref 0.60–1.30)
Calcium, Total: 8.2 mg/dL — ABNORMAL LOW (ref 8.5–10.1)
EGFR (African American): >60
EGFR (Non-African Amer.): >60
Glucose: 117 mg/dL — ABNORMAL HIGH (ref 65–99)
Osmolality: 285 (ref 275–301)
POTASSIUM: 3.6 mmol/L (ref 3.5–5.1)
Sodium: 142 mmol/L (ref 136–145)

## 2014-07-15 LAB — PROTIME-INR
INR: 3.3
PROTHROMBIN TIME: 32.3 s — AB (ref 11.5–14.7)

## 2014-07-15 LAB — TROPONIN I

## 2014-07-17 DIAGNOSIS — I480 Paroxysmal atrial fibrillation: Secondary | ICD-10-CM

## 2014-07-17 DIAGNOSIS — R079 Chest pain, unspecified: Secondary | ICD-10-CM

## 2014-07-17 DIAGNOSIS — I2510 Atherosclerotic heart disease of native coronary artery without angina pectoris: Secondary | ICD-10-CM

## 2014-07-17 DIAGNOSIS — Z950 Presence of cardiac pacemaker: Secondary | ICD-10-CM

## 2014-07-19 ENCOUNTER — Ambulatory Visit (INDEPENDENT_AMBULATORY_CARE_PROVIDER_SITE_OTHER): Payer: Medicare Other | Admitting: Pharmacist

## 2014-07-19 DIAGNOSIS — Z5181 Encounter for therapeutic drug level monitoring: Secondary | ICD-10-CM

## 2014-07-19 DIAGNOSIS — I4891 Unspecified atrial fibrillation: Secondary | ICD-10-CM

## 2014-07-19 LAB — POCT INR: INR: 1.9

## 2014-07-21 ENCOUNTER — Ambulatory Visit: Payer: Self-pay | Admitting: Gastroenterology

## 2014-07-21 LAB — CLOSTRIDIUM DIFFICILE(ARMC)

## 2014-07-24 LAB — STOOL CULTURE

## 2014-07-30 ENCOUNTER — Ambulatory Visit (INDEPENDENT_AMBULATORY_CARE_PROVIDER_SITE_OTHER): Payer: Medicare Other | Admitting: Pharmacist

## 2014-07-30 ENCOUNTER — Encounter: Payer: Self-pay | Admitting: Cardiovascular Disease

## 2014-07-30 ENCOUNTER — Ambulatory Visit (INDEPENDENT_AMBULATORY_CARE_PROVIDER_SITE_OTHER): Payer: Medicare Other | Admitting: Cardiovascular Disease

## 2014-07-30 VITALS — BP 110/82 | HR 73 | Ht 72.0 in | Wt 183.0 lb

## 2014-07-30 DIAGNOSIS — I10 Essential (primary) hypertension: Secondary | ICD-10-CM

## 2014-07-30 DIAGNOSIS — I951 Orthostatic hypotension: Secondary | ICD-10-CM

## 2014-07-30 DIAGNOSIS — I4891 Unspecified atrial fibrillation: Secondary | ICD-10-CM

## 2014-07-30 DIAGNOSIS — E785 Hyperlipidemia, unspecified: Secondary | ICD-10-CM

## 2014-07-30 DIAGNOSIS — Z5181 Encounter for therapeutic drug level monitoring: Secondary | ICD-10-CM

## 2014-07-30 DIAGNOSIS — I2510 Atherosclerotic heart disease of native coronary artery without angina pectoris: Secondary | ICD-10-CM

## 2014-07-30 DIAGNOSIS — R197 Diarrhea, unspecified: Secondary | ICD-10-CM | POA: Insufficient documentation

## 2014-07-30 LAB — POCT INR: INR: 1.7

## 2014-07-30 NOTE — Assessment & Plan Note (Signed)
Blood pressure running low as detailed. May need to cut back on his Lasix if he becomes symptomatic

## 2014-07-30 NOTE — Progress Notes (Signed)
Patient ID: Kyle Morales, male    DOB: 11-23-1935, 78 y.o.   MRN: [id number]  HPI Comments: [name] is a  78 year old gentleman, patient of Dr. Ferdinand CavaWarren Morales, who lives at [place], who presents for routine followup. history of paroxysmal atrial fibrillation dating back to 361992.  coronary artery disease with stent placement at [facility] in 1997, pacemaker placement in [place] in 2007 for diagnosis of atrial fibrillation, heart block. At that time ejection fraction was normal.   In follow-up today, he reports that he continues to have significant diarrhea. Symptoms started September 2015. He required hospitalization at that time for GI distress, atrial fibrillation. GI symptoms have not resolved. He reports that prior workup including cultures were negative. He has follow-up with primary care in the next week or so, follow-up with GI next month. Is not taking Imodium as previously directed.  Recently seen in the emergency room July 15 2014  for cough He was diagnosed with upper respiratory infection. Symptoms have since improved Creatinine that time was normal, potassium 3.6 Recent INR 2, now down to 1.9. Recheck pending from today He denies having any significant lower extremity edema. He takes Lasix 120 mg daily no matter how much loose bowel movements he has. He has been on the high dose Lasix for years.  On his last clinic visit, losartan held for hypotension. Since then blood pressure typically runs 97 up to 110 systolic. He denies any lightheadedness  Other past medical history He went to the ED on 06/14/14 for his chest pain, found to have atrial fibrillation, HR of 148, C diff negative.  started on amiodarone 400 mg bid x 1 week-->200 mg bid-->200 mg daily in an effort to cardiovert him. He converted to NSR for a brief period his first night in the hospital but went back into a-fib and remained in rate controlled a-fib (80s). He was adequately anticoagulated on warfarin at baseline. He  converted to normal sinus rhythm in office follow-up   He had episode of atrial fibrillation August 2014. No further episodes since that time.  He is not doing a regular exercise program, has found his balance is poor.  EKG on today's visit shows normal sinus rhythm with rate 73 bpm, right bundle branch block   On  EP evaluation in December [facility], he had 3.1% atrial fibrillation burden, increased from 1.4%. He has been on the Toprol for a long time. Notes also indicate history of nonsustained VT. Metoprolol dose was increased from 25-100 mg daily.  Last stress test was 3-4 years ago.  In May 2014 he was traveling, could not take his medications on regular basis and developed significant lower extremity edema. 03/04/2013 he presented to the emergency room atrial fibrillation, palpitations. He was given IV fluids and he believes that this converted him back to normal sinus rhythm. His biggest concern is the CT scan showing right lower lobe pneumonia versus mass lesion. He was given several days of antibiotics and instructed to have followup imaging for this finding in the lower lobe.  [facility] stent placed to his ramus Stress test June 2009 with a normal study, ejection fraction 65%   Outpatient Encounter Prescriptions as of 07/30/2014  Medication Sig  . amiodarone (PACERONE) 100 MG tablet Take 1 tablet (100 mg total) by mouth daily.  Marland Kitchen. aspirin 81 MG tablet Take 81 mg by mouth daily.  . Furosemide (LASIX PO) Take 120 mg by mouth daily.   Marland Kitchen. lovastatin (  MEVACOR) 20 MG tablet Take 20 mg by mouth at bedtime.  . metoprolol succinate (TOPROL-XL) 50 MG 24 hr tablet Take two tablets in the am and one tablet in the pm.  . potassium chloride (K-DUR) 10 MEQ tablet Take 20 mEq by mouth daily.   . Warfarin Sodium (COUMADIN PO) Take 2 mg by mouth as directed.   . [DISCONTINUED] metroNIDAZOLE (FLAGYL) 250 MG tablet Take 250 mg by mouth every 6 (six) hours.  . [DISCONTINUED] Multiple  Vitamin (MULTIVITAMIN) tablet Take 1 tablet by mouth daily.    Review of Systems  Constitutional: Negative.   Respiratory: Negative.   Cardiovascular: Negative.   Gastrointestinal: Positive for diarrhea.  Neurological: Negative.   All other systems reviewed and are negative.   BP 110/82 mmHg  Pulse 73  Ht 6' (1.829 m)  Wt 183 lb (83.008 kg)  BMI 24.81 kg/m2  Physical Exam  Constitutional: He is oriented to person, place, and time. He appears well-developed and well-nourished.  HENT:  Head: Normocephalic.  Nose: Nose normal.  Mouth/Throat: Oropharynx is clear and moist.  Eyes: Conjunctivae are normal. Pupils are equal, round, and reactive to light.  Neck: Normal range of motion. Neck supple. No JVD present.  Cardiovascular: Normal rate, regular rhythm, S1 normal, S2 normal, normal heart sounds and intact distal pulses.  Exam reveals no gallop and no friction rub.   No murmur heard. Pulmonary/Chest: Effort normal and breath sounds normal. No respiratory distress. He has no wheezes. He has no rales. He exhibits no tenderness.  Abdominal: Soft. Bowel sounds are normal. He exhibits no distension. There is no tenderness.  Musculoskeletal: Normal range of motion. He exhibits no edema or tenderness.  Lymphadenopathy:    He has no cervical adenopathy.  Neurological: He is alert and oriented to person, place, and time. Coordination normal.  Skin: Skin is warm and dry. No rash noted. No erythema.  Psychiatric: He has a normal mood and affect. His behavior is normal. Judgment and thought content normal.      Assessment and Plan   Nursing note and vitals reviewed.

## 2014-07-30 NOTE — Assessment & Plan Note (Signed)
Recommended that he stay on his lovastatin 

## 2014-07-30 NOTE — Assessment & Plan Note (Signed)
Recommended that he continue on his current medications. Heart rate and rhythm well-controlled. He is on warfarin. INR checked today

## 2014-07-30 NOTE — Assessment & Plan Note (Signed)
Blood pressure somewhat improved off his losartan. Recommended that he not take his Lasix when he has significant GI diarrhea. If blood pressure continues to run low, may need to cut down on the dose of his Lasix. Also recommended extra potassium if he has diarrhea.

## 2014-07-30 NOTE — Assessment & Plan Note (Signed)
Etiology is unclear. He has follow-up with primary care and GI in the next month Recommended he continue on his Imodium

## 2014-07-30 NOTE — Patient Instructions (Signed)
You are doing well. No medication changes were made.  If your blood pressure runs low, or a bad GI day Hold the lasix that day  Please call us if you have new issues that need to be addressed before your next appt.  Your physician wants you to follow-up in: 6 months.  You will receive a reminder letter in the mail two months in advance. If you don't receive a letter, please call our office to schedule the follow-up appointment.  Your next appointment will be scheduled in our new office located at :  [facility]  [address], [address]  [place], [place] [id number]

## 2014-07-30 NOTE — Assessment & Plan Note (Signed)
Currently with no symptoms of angina. No further workup at this time. Continue current medication regimen. 

## 2014-08-04 ENCOUNTER — Ambulatory Visit (INDEPENDENT_AMBULATORY_CARE_PROVIDER_SITE_OTHER): Payer: Medicare Other | Admitting: Pharmacist

## 2014-08-04 DIAGNOSIS — Z5181 Encounter for therapeutic drug level monitoring: Secondary | ICD-10-CM

## 2014-08-04 DIAGNOSIS — I4891 Unspecified atrial fibrillation: Secondary | ICD-10-CM

## 2014-08-04 LAB — POCT INR: INR: 1.7

## 2014-08-04 MED ORDER — WARFARIN SODIUM 1 MG PO TABS: 1.0000 mg | ORAL_TABLET | Freq: Every day | ORAL | Status: DC

## 2014-08-06 DIAGNOSIS — I2510 Atherosclerotic heart disease of native coronary artery without angina pectoris: Secondary | ICD-10-CM | POA: Insufficient documentation

## 2014-08-06 DIAGNOSIS — I509 Heart failure, unspecified: Secondary | ICD-10-CM | POA: Insufficient documentation

## 2014-08-11 ENCOUNTER — Ambulatory Visit (INDEPENDENT_AMBULATORY_CARE_PROVIDER_SITE_OTHER): Payer: Medicare Other

## 2014-08-11 DIAGNOSIS — Z5181 Encounter for therapeutic drug level monitoring: Secondary | ICD-10-CM

## 2014-08-11 DIAGNOSIS — I4891 Unspecified atrial fibrillation: Secondary | ICD-10-CM

## 2014-08-11 LAB — POCT INR: INR: 1.8

## 2014-08-12 ENCOUNTER — Telehealth: Payer: Self-pay

## 2014-08-12 NOTE — Telephone Encounter (Signed)
Received cardiac clearance request for pt to proceed w/ colonoscopy on 08/30/14. Per Eula Listenyan Dunn, PA: "Ok to discontinue to coumadin 5 days prior to procedure -No bridge -supported by the BRIDGE trial -INR should be confirmed below 1.5 prior to procedure" Faxed to [facility] at [phone].

## 2014-08-18 ENCOUNTER — Ambulatory Visit (INDEPENDENT_AMBULATORY_CARE_PROVIDER_SITE_OTHER): Payer: Medicare Other

## 2014-08-18 DIAGNOSIS — I4891 Unspecified atrial fibrillation: Secondary | ICD-10-CM

## 2014-08-18 DIAGNOSIS — Z5181 Encounter for therapeutic drug level monitoring: Secondary | ICD-10-CM

## 2014-08-18 LAB — POCT INR: INR: 2.1

## 2014-08-30 ENCOUNTER — Ambulatory Visit: Payer: Self-pay | Admitting: Gastroenterology

## 2014-08-30 LAB — PROTIME-INR
INR: 1.2
PROTHROMBIN TIME: 15.4 s — AB (ref 11.5–14.7)

## 2014-09-08 ENCOUNTER — Ambulatory Visit (INDEPENDENT_AMBULATORY_CARE_PROVIDER_SITE_OTHER): Payer: Medicare Other

## 2014-09-08 DIAGNOSIS — Z5181 Encounter for therapeutic drug level monitoring: Secondary | ICD-10-CM

## 2014-09-08 DIAGNOSIS — I4891 Unspecified atrial fibrillation: Secondary | ICD-10-CM

## 2014-09-08 LAB — POCT INR: INR: 1.7

## 2014-09-22 ENCOUNTER — Ambulatory Visit: Payer: Self-pay | Admitting: Gastroenterology

## 2014-09-22 LAB — URINALYSIS, COMPLETE
Bacteria: NONE SEEN
SPECIFIC GRAVITY: 1.025 (ref 1.003–1.030)
Squamous Epithelial: 1
WBC UR: 42 /HPF (ref 0–5)

## 2014-09-22 LAB — PROTIME-INR
INR: 1.9
Prothrombin Time: 21.7 secs — ABNORMAL HIGH (ref 11.5–14.7)

## 2014-09-22 LAB — CBC WITH DIFFERENTIAL/PLATELET
BASOS PCT: 0.7 %
Basophil #: 0.1 10*3/uL (ref 0.0–0.1)
EOS ABS: 0.4 10*3/uL (ref 0.0–0.7)
Eosinophil %: 4.7 %
HCT: 52.7 % — ABNORMAL HIGH (ref 40.0–52.0)
HGB: 17.8 g/dL (ref 13.0–18.0)
Lymphocyte #: 1.1 10*3/uL (ref 1.0–3.6)
Lymphocyte %: 11.8 %
MCH: 32.5 pg (ref 26.0–34.0)
MCHC: 33.8 g/dL (ref 32.0–36.0)
MCV: 96 fL (ref 80–100)
Monocyte #: 0.7 x10 3/mm (ref 0.2–1.0)
Monocyte %: 7.2 %
NEUTROS ABS: 7.2 10*3/uL — AB (ref 1.4–6.5)
Neutrophil %: 75.6 %
Platelet: 158 10*3/uL (ref 150–440)
RBC: 5.49 10*6/uL (ref 4.40–5.90)
RDW: 13.7 % (ref 11.5–14.5)
WBC: 9.5 10*3/uL (ref 3.8–10.6)

## 2014-09-22 LAB — HEPATIC FUNCTION PANEL A (ARMC)
ALK PHOS: 83 U/L
Albumin: 4.8 g/dL (ref 3.4–5.0)
BILIRUBIN DIRECT: 0.2 mg/dL (ref 0.0–0.2)
Bilirubin,Total: 1 mg/dL (ref 0.2–1.0)
SGOT(AST): 24 U/L (ref 15–37)
SGPT (ALT): 34 U/L
Total Protein: 7.9 g/dL (ref 6.4–8.2)

## 2014-09-22 LAB — BASIC METABOLIC PANEL
Anion Gap: 2 — ABNORMAL LOW (ref 7–16)
BUN: 17 mg/dL (ref 7–18)
CALCIUM: 9.3 mg/dL (ref 8.5–10.1)
CHLORIDE: 104 mmol/L (ref 98–107)
CO2: 30 mmol/L (ref 21–32)
Creatinine: 1.41 mg/dL — ABNORMAL HIGH (ref 0.60–1.30)
EGFR (African American): >60
GFR CALC NON AF AMER: 52 — AB
Glucose: 115 mg/dL — ABNORMAL HIGH (ref 65–99)
OSMOLALITY: 274 (ref 275–301)
POTASSIUM: 4.6 mmol/L (ref 3.5–5.1)
Sodium: 136 mmol/L (ref 136–145)

## 2014-09-23 ENCOUNTER — Ambulatory Visit: Payer: Self-pay | Admitting: Gastroenterology

## 2014-09-24 LAB — URINE CULTURE

## 2014-09-27 ENCOUNTER — Ambulatory Visit: Payer: Self-pay | Admitting: Gastroenterology

## 2014-09-27 LAB — CLOSTRIDIUM DIFFICILE(ARMC)

## 2014-09-29 ENCOUNTER — Ambulatory Visit (INDEPENDENT_AMBULATORY_CARE_PROVIDER_SITE_OTHER): Payer: Medicare Other

## 2014-09-29 DIAGNOSIS — I4891 Unspecified atrial fibrillation: Secondary | ICD-10-CM

## 2014-09-29 DIAGNOSIS — Z5181 Encounter for therapeutic drug level monitoring: Secondary | ICD-10-CM

## 2014-09-29 LAB — POCT INR: INR: 2.3

## 2014-10-20 ENCOUNTER — Ambulatory Visit (INDEPENDENT_AMBULATORY_CARE_PROVIDER_SITE_OTHER): Payer: Medicare Other | Admitting: Pharmacist

## 2014-10-20 ENCOUNTER — Other Ambulatory Visit: Payer: Self-pay | Admitting: *Deleted

## 2014-10-20 ENCOUNTER — Telehealth: Payer: Self-pay | Admitting: Cardiovascular Disease

## 2014-10-20 DIAGNOSIS — I4891 Unspecified atrial fibrillation: Secondary | ICD-10-CM

## 2014-10-20 DIAGNOSIS — Z5181 Encounter for therapeutic drug level monitoring: Secondary | ICD-10-CM

## 2014-10-20 LAB — POCT INR: INR: 1.4

## 2014-10-20 MED ORDER — AMIODARONE HCL 100 MG PO TABS: 100.0000 mg | ORAL_TABLET | Freq: Every day | ORAL | Status: DC

## 2014-10-20 MED ORDER — WARFARIN SODIUM 1 MG PO TABS: 1.0000 mg | ORAL_TABLET | Freq: Every day | ORAL | Status: DC

## 2014-10-20 NOTE — Telephone Encounter (Signed)
Patient requesting refill for Warfarin. Please review for refill, Thank you.  [facility]

## 2014-10-20 NOTE — Telephone Encounter (Signed)
°  1. Which medications need to be refilled? Pacerone and warfarin  2. Which [facility]   3. Do they need a 30 day or 90 day supply? 90 day x 3 per patient note  4. Would they like a call back once the medication has been sent to the pharmacy? No, the pharmacy will call when submitted

## 2014-11-03 ENCOUNTER — Ambulatory Visit (INDEPENDENT_AMBULATORY_CARE_PROVIDER_SITE_OTHER): Payer: Medicare Other | Admitting: Pharmacist

## 2014-11-03 DIAGNOSIS — Z5181 Encounter for therapeutic drug level monitoring: Secondary | ICD-10-CM

## 2014-11-03 DIAGNOSIS — I4891 Unspecified atrial fibrillation: Secondary | ICD-10-CM

## 2014-11-03 LAB — POCT INR: INR: 1.6

## 2014-11-17 ENCOUNTER — Ambulatory Visit (INDEPENDENT_AMBULATORY_CARE_PROVIDER_SITE_OTHER): Payer: Medicare Other

## 2014-11-17 DIAGNOSIS — I4891 Unspecified atrial fibrillation: Secondary | ICD-10-CM

## 2014-11-17 DIAGNOSIS — Z5181 Encounter for therapeutic drug level monitoring: Secondary | ICD-10-CM

## 2014-11-17 LAB — POCT INR: INR: 1.8

## 2014-12-01 ENCOUNTER — Ambulatory Visit (INDEPENDENT_AMBULATORY_CARE_PROVIDER_SITE_OTHER): Payer: Medicare Other

## 2014-12-01 DIAGNOSIS — Z5181 Encounter for therapeutic drug level monitoring: Secondary | ICD-10-CM | POA: Diagnosis not present

## 2014-12-01 DIAGNOSIS — I4891 Unspecified atrial fibrillation: Secondary | ICD-10-CM | POA: Diagnosis not present

## 2014-12-01 DIAGNOSIS — N200 Calculus of kidney: Secondary | ICD-10-CM | POA: Insufficient documentation

## 2014-12-01 LAB — POCT INR: INR: 1.9

## 2014-12-03 ENCOUNTER — Telehealth: Payer: Self-pay

## 2014-12-03 NOTE — Telephone Encounter (Signed)
Called spoke with pt's wife advised per Dr Mariah MillingGollan OK to hold Coumadin 3 days prior to eyelid surgery on 02/04/15.  Will give detailed instructions for holding Coumadin as surgery date approaches.  Pt's wife verbalized understanding.

## 2014-12-03 NOTE — Telephone Encounter (Signed)
-----   Message from Antonieta Ibaimothy J Gollan, MD sent at 12/01/2014  6:13 PM EST ----- I think that should be fine, He has rare episodes of atrial fibrillation, Was in sinus rhythm on last clinic visit thx Tim  ----- Message -----    From: Migdalia DkErika W Johnson, RN    Sent: 12/01/2014   2:05 PM      To: Antonieta Ibaimothy J Gollan, MD  Pt is pending eyelid surgery, pt's eye dr recommended holding Coumadin 3 days prior to procedure on 02/04/15.  Please advise if ok to hold Coumadin.  Thanks

## 2014-12-14 DIAGNOSIS — B369 Superficial mycosis, unspecified: Secondary | ICD-10-CM | POA: Insufficient documentation

## 2014-12-15 ENCOUNTER — Ambulatory Visit (INDEPENDENT_AMBULATORY_CARE_PROVIDER_SITE_OTHER): Payer: Medicare Other

## 2014-12-15 DIAGNOSIS — I4891 Unspecified atrial fibrillation: Secondary | ICD-10-CM

## 2014-12-15 DIAGNOSIS — Z5181 Encounter for therapeutic drug level monitoring: Secondary | ICD-10-CM | POA: Diagnosis not present

## 2014-12-15 LAB — POCT INR: INR: 1.9

## 2014-12-20 ENCOUNTER — Other Ambulatory Visit: Payer: Self-pay | Admitting: Gastroenterology

## 2015-01-05 ENCOUNTER — Ambulatory Visit (INDEPENDENT_AMBULATORY_CARE_PROVIDER_SITE_OTHER): Payer: Medicare Other

## 2015-01-05 DIAGNOSIS — Z5181 Encounter for therapeutic drug level monitoring: Secondary | ICD-10-CM

## 2015-01-05 DIAGNOSIS — I4891 Unspecified atrial fibrillation: Secondary | ICD-10-CM

## 2015-01-05 LAB — POCT INR: INR: 2.2

## 2015-01-15 NOTE — Discharge Summary (Signed)
PATIENT NAME:  Kyle Morales, Kyle Morales MR#:  [id number] DATE OF BIRTH:  02-22-1936  DATE OF ADMISSION:  06/14/2014 DATE OF DISCHARGE:  06/18/2014  PRIMARY CARE PHYSICIAN:  [facility]   DISCHARGE DIAGNOSES: 1.  Atrial fibrillation with rapid ventricular response. 2.  Diarrhea. 3.  Elevated troponins secondary gastrointestinal illness.  4.  Hypertension.  5.  Hypokalemia.  6.  Coronary artery disease.   DISCHARGE MEDICATIONS: 1.  Lovastatin 20 mg daily at bedtime. 2.  Coumadin 3 mg daily. Dose is like 4-4-3 rotation 3.  Aspirin 81 mg daily.  4.  KCl 20 mEq 2 tablets p.o. b.i.d. 5.  Metoprolol succinate 25 mg p.o. daily. 6.  Lasix 40 mg p.o. daily. (Note that metoprolol and furosemide have been decreased due to blood pressure being low)  7.  Flagyl 250 mg every 6 hours for 7 days. 8.  Amiodarone has been added. It is at 400 mg p.o. b.i.d. for 7 days and then after that the patient can take 200 mg daily.  9.  Losartan 25 mg daily.   DIET: Low-sodium, low-fat diet.   NOTE: Dose of Lasix, furosemide, and metoprolol adjusted.   CONSULTANTS: Dr. Kirke CorinArida.   HOSPITAL COURSE: 271.  A 79 year old male patient with paroxysmal atrial fibrillation who sees Dr. Mariah MillingGollan as the primary cardiologist comes in because of chest pain, and the patient also had diarrhea. The patient has past medical history of hypertension, history of atrial fibrillation and history of coronary artery disease status post stenting in 1997. The patient's chest pain was midsternal pain, not radiating type. No nausea or vomiting or diaphoresis. No trouble breathing. Pain relieved with nitroglycerin. The patient's heart rate was 148 in the ER and he received multiple doses of adenosine and received metoprolol IV pushes. The patient was admitted to hospitalist service for paroxysmal atrial fibrillation with RVR. The patient was seen by cardiology and troponins were slightly up at 0.12 and then trended down to 0.09. The patient thought to  have A-fib with RVR in the context GI illness due to diarrhea. Seen by cardiology. Amiodarone has been added. He is on loading dose of 400 mg b.i.d. for a week and then 200 mg daily for a week. The target dose is 200 mg of amiodarone daily. The patient's heart rate has been stable with that dose. The patient's heart rate has been around 86; however, he was on metoprolol, Lasix and also losartan. His metoprolol was at 100 mg of Toprol-XL daily and also Cozaar at 100 mg daily and Lasix 40 mg t.i.d. at home. His blood pressure has been at 115/76. We stopped the Lasix on admission secondary to diarrhea and losartan also was stopped due to diarrhea. The patient's Lasix and losartan have been stopped. Metoprolol has been adjusted. The patient's blood pressure at the time of discharge was 116/82 and pulse 86, so we have cut down the doses of Lasix and also losartan and also metoprolol. The patient will follow up with Dr. Dossie Arbourim Gollan and they can adjust the medications accordingly. The patient continued on Coumadin for A-fib.  2.  Diarrhea and hypokalemia. He has been having diarrhea for 10 days. The patient's stools have been negative for C. diff, ova and parasites and Campylobacter. Because of persistent diarrhea, a GI consult was obtained. The patient received some IV fluids at the time of admission. He was seen by Dr. Marva PandaSkulskie, and he started the patient on Flagyl empirically. The patient's giardia assay was also negative. Giardia panel  in the stool was negative too. The patient's diarrhea improved with Flagyl, so Dr. Marva Panda mentioned that he can continue Flagyl for 7 to 10 day course, and he will need outpatient GI follow-up in 2 weeks and possible luminal evaluation at that time. The patient is given empirically Flagyl and his symptoms nicely improved. He wrote a prescription for Flagyl for 7 days.  TIME SPENT ON DISCHARGE PREPARATION: More than 30 minutes.  ____________________________ Katha Hamming,  MD sk:sb D: 06/21/2014 07:17:40 ET T: 06/21/2014 07:43:19 ET JOB#: [id number]  cc: Katha Hamming, MD, <Dictator> Dr. Ezzard Standing [facility] A. Kirke Corin, MD Antonieta Iba, MD Christena Deem, MD Katha Hamming MD ELECTRONICALLY SIGNED 06/21/2014 15:41

## 2015-01-15 NOTE — Consult Note (Signed)
Chief Complaint:  Subjective/Chief Complaint Please see full Gi consult and brief consult note. Patient seen and examined, chart reviewed. Patient presenting with diarrhea for 2 weeks, with AF/RVR.  Will treat emperically with antibiotics, but if no improvement in the next few days, will consider luminal evaluation with egd/colonoscopy.  Followint.  Of note, on discussion of abx with patient and his wife he has had a rash reaction to amoxicillin previously.  As such will continue flagyl, d/c orders for augmentin ( hasnt gotten any yet). May consider adding second antibiotic, possible zithromycin.   Electronic Signatures: Barnetta ChapelSkulskie,  (MD)  (Signed 23-Sep-15 18:15)  Authored: Chief Complaint   Last Updated: 23-Sep-15 18:15 by Barnetta ChapelSkulskie,  (MD)

## 2015-01-15 NOTE — Consult Note (Signed)
PATIENT NAME:  Kyle Morales, Kyle Morales MR#:  [id number] DATE OF BIRTH:  1936-07-09  DATE OF CONSULTATION:  06/16/2014  REFERRING PHYSICIAN:    CONSULTING PHYSICIAN:  Keturah Barre, NP  REASON FOR CONSULTATION: GI consult ordered by Dr. Luberta Mutter to evaluate for diarrhea.   HISTORY OF PRESENT ILLNESS: I appreciate consult for pleasant 79 year old Caucasian man admitted with atrial fibrillation with rapid ventricular response for evaluation of diarrhea. States diarrhea onset 10 days ago on a Saturday, states the Thursday and Friday immediately preceding, stools were softer than normal, but not loose. On Saturday began several urgent watery explosive stools daily through Wednesday, took several Imodium and it improved; did not have a bowel movement again until Sunday evening, then diarrhea started again as before; went to walk-in clinic on Monday, was directed to the ED due to rapid heart rate. This was treated, he is being evaluated by cardiology. He denies diarrhea yesterday but onset this morning at 0530, had several episodes every 15 to 90 minutes apart. States that just prior to diarrheal episodes, he has mid to low epigastric pain that travels down to his bilateral lower quadrants, defecating makes it feels better, feels gassy at times, took some Gas-X at home with some benefit. Took some Imodium earlier this afternoon and has had improvement in his diarrhea symptoms.   He denies medication changes, recent medication or  dietary changes, sick contacts prior to procedure. No pets, food triggers, and says his symptoms are independent of eating. States he watches his diet. No increased amounts of sodium, and takes 1 cup of coffee a day; denies stress. States he has very rare reflux, relief with a p.r.n. OTC Zantac, purposefully lost 25 pounds over the last year. Thinks he has lost about 4 pounds recently due to the diarrhea. States that his last colonoscopy was 3 to 4 years ago, done at [facility]; states no polyp  history, no history of celiac, IBD, or other GI disorder; denies family GI history. States he has never had symptoms like this before; denies further GI complaints; noted negative C. difficile and stool culture, ova and parasite is still pending. No fever. Sometimes has some hot and cold flashes.   PAST MEDICAL HISTORY: Chronic atrial fibrillation with sick sinus syndrome status post pacemaker, hypertension, and follows with Dr. Mariah Milling.   HOME MEDICATIONS: ASA 81 mg p.o. daily, Coumadin 3 mg p.o. daily, Lasix 40 mg p.o. t.i.d., lovastatin 20 mg p.o. daily, metoprolol 100 mg p.o. daily, potassium 20 mEq p.o. b.i.d.   SOCIAL HISTORY: No smoking or illicits; reports in-taking 2 beers every two weeks; lives at home with his wife.   PAST SURGICAL HISTORY: Bilateral shoulder surgery, pacemaker placement.   FAMILY HISTORY: No hypertension, diabetes; denies colorectal cancer, colon polyps, celiac, IBD, ulcers, and liver disease.   REVIEW OF SYSTEMS: Ten systems reviewed, unremarkable other than what is noted above.   LABORATORY: Most recent, glucose 95, BUN 15, creatinine 0.95, sodium 143, potassium 3.6, chloride 112, GFR greater than 60, total protein 7.7; albumin 4.1, total bilirubin 1.1, ALP 74, AST 39, ALT 39; troponin less than 0.020, 0.120, 0.09; TSH 3.4; WBC 7, hemoglobin 15.3, hematocrit 45.3, platelet count 146,000, red cells normocytic, PT 23.5, INR 2.2   PHYSICAL EXAMINATION:  VITAL SIGNS: Most recent, temperature 97.7, pulse 81, respiratory rate 18, blood pressure 106/76, SaO2 of 95% on room air.  GENERAL: Pleasant, alert, Caucasian man resting comfortably in bed in no acute distress.  HEENT: Normocephalic, atraumatic; conjunctivae pink, sclerae clear.  NECK: Supple. No JVD.  CHEST: Respirations eupneic. Lungs clear.  CARDIAC: S1, S2, rate somewhat irregular, trace generalized edema, no murmur, rub, or gallop.  ABDOMEN: Flat. Bowel sounds x 4, nondistended, nontender; no  hepatosplenomegaly, rebound, tenderness, or other peritoneal signs or masses.  RECTUM: Within defined limits, heme-negative, nontender.  SKIN: Warm, dry, pink. No erythema, lesion or rash.  MUSCULOSKELETAL: MAEW x 4, sensation intact, no clubbing, cyanosis, or problems with edema.  SKIN: Warm, dry, pink.  NEUROLOGIC: Alert, oriented x 3; cranial nerves II through XII intact, speech clear, no facial droop.  PSYCHIATRIC: Pleasant, calm, cooperative.   IMPRESSION AND PLAN: Change in bowel habits, diarrhea, broad differential; will wait ova  and parasite. Recommend adequate hydration, would like his colonoscopy records. I did discuss further with Dr. Marva PandaSkulskie, and we will for now treat empirically with Cipro and Flagyl for a week, then consider luminal evaluation if he is no better. This can be done as an outpatient. He would likely benefit from some probiotics in a couple of days.   Thank you very much for this consult.   These services were provided by Vevelyn Pathristiane , MSN, [facility] LPNPC, in collaboration with Barnetta ChapelMartin Skulskie, M.D. with whom I have discussed this patient in full.   Thank you very much for this consult.    ____________________________ Keturah Barrehristiane H. , NP chl:nt D: 06/16/2014 16:06:21 ET T: 06/16/2014 16:21:42 ET JOB#: [id number]  cc: Keturah Barrehristiane H. , NP, <Dictator> Eustaquio MaizeHRISTIANE H  FNP ELECTRONICALLY SIGNED 06/21/2014 14:03

## 2015-01-15 NOTE — Consult Note (Signed)
Brief Consult Note: Diagnosis: atrial fib.   Comments: Will change cipro to augmentin due to interaction with amiodarone (this is a class effect).  Electronic Signatures: Vevelyn PatLondon,  H (NP)  (Signed 23-Sep-15 16:12)  Authored: Brief Consult Note   Last Updated: 23-Sep-15 16:12 by Keturah BarreLondon,  H (NP)

## 2015-01-15 NOTE — Consult Note (Signed)
Brief Consult Note: Diagnosis: atrial fibrillation.   Patient was seen by consultant.   Consult note dictated.   Comments: Appreciate consult for pleasant 79 y/o caucasian man admitted with atrial fibrillation/RVR for evaluation of diarrhea.  States his diarrhea onset was 10d ago on a Saturday. States the Thurs/Fri imeed preceeding stools were softer than normal but not loose. On Sat began several urgent watery explosive stools daily through Wednesday. Took several Immodium, and it improved. Did not have bm again until Sunday pm, then diarrhea started again as before. Went to walk in clinc and was directed to ED due to rapid heart rate. This was treated & he has been eval'd by cards. Denies diarrhea yesterday, but onset this am @0530 , had several episode q8150m-27m apart. States that just prior to diarrhea he has mid to lower epigastric pain that travels down to bilater lower quads. Defecating makes it better. Feels gassy at times, took some gasx at home with benefit. States no med changes, recent abx, dietay changes, or sick contacts prior to procedure. No pets, no food triggers, sx independent of eating. States he watches his diet, no >soda, 1c coffee/d. Denies stress. States he has very rare reflux relieved with a prn otc zantac. Purposefully lost 25lb over the last year. Thinks he has lost about 4lb due to the diarrhea. [facility]. States no polyp history, no hx celiac, IBD, or other GI disorder. Denies fam GI hx. States he has never had sx like this before. Denies further GI complaints. Noted neg c-diff and stool cx. Ova/parasite still pending. no fever. abd soft, nd/nt on exam. Rectal exam wdl, heme negative. cbc unremarkable.  Impression and plan. Change in bowel habits. Broad differential. Will await o/p for now. adequate hydration. Would like cspy records. Further recommendations to follow.  Addendum- did d/w Dr Marva PandaSkulskie and for now will treat empirically with Cipro/flagyl x 1w  then consider luminal evaluation if no better, which can be done as an outpatient. Would likely benefit from some probiotics in a couple of days.  Electronic Signatures: Vevelyn PatLondon,  H (NP)  (Signed 23-Sep-15 15:59)  Authored: Brief Consult Note   Last Updated: 23-Sep-15 15:59 by Keturah BarreLondon,  H (NP)

## 2015-01-15 NOTE — Consult Note (Signed)
General Aspect PCP: Dr. Lucia Gaskins, MD at [facility] Primary Cardiologist: Dr. Rockey Situ, MD _____________________________  79 year old male with h/o CAD s/p stenting in 1997, paroxysmal a-fib dating back to 1992 s/p Medtronic PPM 2007 for a-fib/CBH, HTN, and HLD who presented to [facility] 06/14/14 with complaints of substernal chest pain that began this morning and diarrhea that has been on going for the past 12 days.    PMH:  1. CAD s/p stenting 1997 2. Paroxysmal a-fib dating back to 65 s/p Medtronic PPM 2007 (placed in [place]) for sick sinus syndrome 3. HTN 4. HLD ______________________________   Present Illness 79 year old male with h/o CAD s/p stenting in 1997, paroxysmal a-fib dating back to 1992 s/p Medtronic PPM 2007 for a-fib/CBH, HTN, and HLD who presented to [facility] 06/14/14 with complaints of substernal chest pain that began this morning and diarrhea that has been on going for the past 12 days.  He comes in this admission with complaints of substernal chest pain that began this morning as well as profusely watery diarrhea that began 12 days ago. Chest pain is characterized as an ache along the middle of his chest that does not radiate. No associated nausea, vomiting, diaphoresis, palpitations, SOB, dyspnea, edema, presyncope, or syncope. Chest pain was relieved with SL NTG. He also notes 8-9 watery stools daily for the past 12 days along with some epigastric discomfort and back discomfort initially that have since resolved. No fevers or chills. He denies any recent camping trips or time spent in the woods/outdoors. No BRBPR. Again, no nausea or vomiting.   Upon arrival to the ED on 9/21 for his chest pain he was found to have a HR of 148 and received multiple doses of adenosine without relief of his tachycardia. He also received metoprolol 10 mg IV push and 20 mg po in the ED. Upon his admission to the hospital his HR was in the 80s with a BP 90/80. His labs were significant for K+ 4.2 which trended  down to 3.6 this morning, TnI <0.02-->0.12-->0.09, WBC 10.3-->7.1, C diff negative. He has been chest pain free since his admission to [facility]. We are asked to evaluate in the setting of his a-fib with RVR.  Of note, he has a trip planned to [place] this week that he wants to leave for.  Patient with history of known CAD with notes indicating stenting to the ramus. Last nuclear stress test  04/03/2010: There is a moderate in size, mild inseverity, fixed defect involving the inferolateral segment. This is consistent with probable attenuation artifact. Nuclear Wall Motion Findings: Global systolic function is overall normal. The ejection fraction is greater than 65%. Impression:- There is no evidence for any significant ischemia noted. There was no change when compared to the previous study 03/16/2008. He had PPM placed in [place] in 2007 2/2 sick sinus syndrome. Last device interogation was 12/01/13 with >4hr a-fib noted, longest episode 10 hrs 10/18/13, VHR tracings  are consistent  with 1:1SVT, while the AF  episodes appear  consistent with  more AF. AFburden has shown  a slight decrease  from the 8.6% measured at last remote.   Physical Exam:  GEN well developed, well nourished, no acute distress   HEENT hearing intact to voice, moist oral mucosa   NECK supple   RESP normal resp effort  clear BS   CARD Irregular rate and rhythm  No murmur   ABD denies tenderness  soft  normal BS   LYMPH negative neck  EXTR negative edema   SKIN normal to palpation, No rashes   NEURO motor/sensory function intact   PSYCH alert, A+O to time, place, person, good insight   Review of Systems:  Subjective/Chief Complaint Chest pain, SOB, palpitations   General: Fatigue   Skin: No Complaints   ENT: No Complaints   Eyes: No Complaints   Neck: No Complaints   Respiratory: Short of breath   Cardiovascular: Chest pain or discomfort  Tightness  Palpitations   Gastrointestinal: Diarrhea  epigastric  pain initially   Genitourinary: No Complaints   Vascular: No Complaints   Musculoskeletal: back pain initially   Neurologic: No Complaints   Hematologic: No Complaints   Endocrine: No Complaints   Psychiatric: No Complaints   Review of Systems: All other systems were reviewed and found to be negative   Medications/Allergies Reviewed Medications/Allergies reviewed   Family & Social History:  Family and Social History:  Family History Coronary Artery Disease  mother: CAD, HTN, HLD. father: CAD, MI.  brother CAD   Social History negative tobacco, positive ETOH, negative Illicit drugs   Place of Living Home      Atrial Fibrillation:    Stent, Cardiac:    HTN:    Pacemaker:    Shoulder Surgery - Right:    Shoulder Surgery - Left:          Admit Diagnosis:   CHEST PAIN HYPOTENSION AFIB WITH RVR: Onset Date: 15-Jun-2014, Status: Active, Description: CHEST PAIN HYPOTENSION AFIB WITH RVR  Home Medications: Medication Instructions Status  metoprolol 100 mg oral tablet, extended release 1 tab(s) orally once a day Active  lovastatin 20 mg oral tablet 1 tab(s) orally once a day (at bedtime) Active  Coumadin 3 mg oral tablet 1 tab(s) orally once a day dose changes daily to a 4-4-3 rotation Active  Lasix 40 mg oral tablet 3  orally once a day Active  Aspir 81 81 mg oral tablet 1 tab(s) orally once a day Active  metoprolol 50 mg oral tablet 1 tab(s) orally once a day (at bedtime) Active  potassium chloride 20 mEq oral tablet, extended release 2 tab(s) orally 2 times a day Active   Lab Results:  Routine Chem:  22-Sep-15 03:59   Glucose, Serum 95  BUN 15  Creatinine (comp) 0.95  Sodium, Serum 143  Potassium, Serum 3.6  Chloride, Serum  112  CO2, Serum 24  Calcium (Total), Serum  8.3  Anion Gap 7  Osmolality (calc) 286  eGFR (African American) >60  eGFR (Non-African American) >60 (eGFR values <6m/min/1.73 m2 may be an indication of chronic kidney disease  (CKD). Calculated eGFR is useful in patients with stable renal function. The eGFR calculation will not be reliable in acutely ill patients when serum creatinine is changing rapidly. It is not useful in  patients on dialysis. The eGFR calculation may not be applicable to patients at the low and high extremes of body sizes, pregnant women, and vegetarians.)  Cardiac:  21-Sep-15 13:48   Troponin I < 0.02 (0.00-0.05 0.05 ng/mL or less: NEGATIVE  Repeat testing in 3-6 hrs  if clinically indicated. >0.05 ng/mL: POTENTIAL  MYOCARDIAL INJURY. Repeat  testing in 3-6 hrs if  clinically indicated. NOTE: An increase or decrease  of 30% or more on serial  testing suggests a  clinically important change)    19:43   Troponin I  0.12 (0.00-0.05 0.05 ng/mL or less: NEGATIVE  Repeat testing in 3-6 hrs  if clinically indicated. >0.05 ng/mL: POTENTIAL  MYOCARDIAL INJURY. Repeat  testing in 3-6 hrs if  clinically indicated. NOTE: An increase or decrease  of 30% or more on serial  testing suggests a  clinically important change)    23:11   Troponin I  0.09 (0.00-0.05 0.05 ng/mL or less: NEGATIVE  Repeat testing in 3-6 hrs  if clinically indicated. >0.05 ng/mL: POTENTIAL  MYOCARDIAL INJURY. Repeat  testing in 3-6 hrs if  clinically indicated. NOTE: An increase or decrease  of 30% or more on serial  testing suggests a  clinically important change)  Routine Hem:  22-Sep-15 03:59   WBC (CBC) 7.1  RBC (CBC) 4.62  Hemoglobin (CBC) 15.3  Hematocrit (CBC) 45.3  Platelet Count (CBC)  146  MCV 98  MCH 33.1  MCHC 33.8  RDW 13.6  Neutrophil % 57.2  Lymphocyte % 25.1  Monocyte % 8.5  Eosinophil % 8.7  Basophil % 0.5  Neutrophil # 4.1  Lymphocyte # 1.8  Monocyte # 0.6  Eosinophil # 0.6  Basophil # 0.0 (Result(s) reported on 15 Jun 2014 at 05:03AM.)   EKG:  EKG Interp. by me   Interpretation EKG shows A-fib, , nonspecific st/t changes   Radiology Results: XRay:    21-Sep-15  15:31, Chest Portable Single View  Chest Portable Single View   REASON FOR EXAM:    Chest pain  COMMENTS:       PROCEDURE: DXR - DXR PORTABLE CHEST SINGLE VIEW  - Jun 14 2014  3:31PM     CLINICAL DATA:  Chest pain and difficulty breathing    EXAM:  PORTABLE CHEST - 1 VIEW    COMPARISON:  March 04, 2013 chest radiograph and chest CT    FINDINGS:  There is mild atelectasis in the lateral left base. Elsewhere lungs  are clear. Heart is mildly enlarged with pulmonary vascularity  within normal limits. Pacemaker leads are attached to the right  atrium right ventricle. No pneumothorax. No adenopathy. No bone  lesions. There is atherosclerotic change in the aorta.     IMPRESSION:  Lateral left base atelectasis. Elsewhere lungs clear. Heart mildly  enlarged.      Electronically Signed    By: Lowella Grip M.D.    On: 06/14/2014 15:35         Verified By: Leafy Kindle. WOODRUFF, M.D.,    No Known Allergies:   Vital Signs/Nurse's Notes: **Vital Signs.:   22-Sep-15 06:15  Vital Signs Type Routine  Temperature Temperature (F) 97.6  Celsius 36.4  Temperature Source oral  Pulse Pulse 91  Respirations Respirations 18  Systolic BP Systolic BP 353  Diastolic BP (mmHg) Diastolic BP (mmHg) 69  Mean BP 79  Pulse Ox % Pulse Ox % 93  Pulse Ox Activity Level  At rest  Oxygen Delivery Room Air/ 21 %    Impression 79 year old male with h/o CAD s/p stenting in 1997, paroxysmal a-fib dating back to 1992 s/p Medtronic PPM 2007 for a-fib/CBH, HTN, and HLD who presented to [facility]14[facility]14 with complaints of substernal chest pain and diarrhea.    1. Demand ischemia in the setting of GI illness, atrial fib with RVR: -TnI mildly elevated at 0.12 then trended down at 0.09 likely 2/2 supply demand ischemia from GI illness/tachycardia Consider oupt stress test to rule out ischemia  2. Paroxysmal a-fib with RVR: -Rates into the 140s upon presentation to [facility], remains in a-fib, but rate  controlled -In an effort to cardiovert, Will Add amiodarone load at 400 mg bid x  1 week, 200 mg x 1 week, goal 200 mg daily (check TSH & HFP)  -Continue Toprol XL 100 mg daily -Currently adequately anticoagulated on warfarin  3. CAD: -On aspirin 81 mg, Toprol XL 100 mg, lovastatin 20 mg -Add FLP  4. Hypokalemia: -2/2 GI illness -Replete, currently on KCl 20 meq bid, goal K+ 4.0  5. Diarrhea: -C diff negative -No further BMs since the previous night -Driving force for the above -Per IM   Electronic Signatures: Rise Mu (PA-C)  (Signed 22-Sep-15 11:01)  Authored: General Aspect/Present Illness, History and Physical Exam, Review of System, Family & Social History, Past Medical History, Home Medications, Labs, EKG , Radiology, Allergies, Vital Signs/Nurse's Notes, Impression/Plan Ida Rogue (MD)  (Signed 22-Sep-15 12:49)  Authored: General Aspect/Present Illness, History and Physical Exam, Review of System, Family & Social History, Health Issues, EKG , Vital Signs/Nurse's Notes, Impression/Plan  Co-Signer: General Aspect/Present Illness, Home Medications, Allergies   Last Updated: 22-Sep-15 12:49 by Ida Rogue (MD)

## 2015-01-15 NOTE — H&P (Signed)
PATIENT NAME:  Kyle Morales, Kyle Morales MR#:  [id number] DATE OF BIRTH:  28-May-1936  DATE OF ADMISSION:  06/14/2014  PRIMARY CARE PHYSICIAN:  Dr. Ezzard Standing at [facility].   PRIMARY CARDIOLOGIST:  Antonieta Iba, MD   EMERGENCY ROOM PHYSICIAN: Dr. Derrill Kay    CHIEF COMPLAINT: Chest pain.   HISTORY OF PRESENT ILLNESS: A 79 year old male patient with a history of chronic atrial fibrillation on Coumadin, who comes in because of chest pain.  The patient noted to have chest pain in the middle of the chest this morning, did not have any nausea or vomiting. Did not have sweating.  No radiation of pain to arms or the neck.  Pain not relieved with nitroglycerin. Denies any cough or fever.  The patient noted to have diarrhea.  Since the past 9 days, the patient had diarrhea, about 8 to 9 episodes, which kind of subsided in the middle and restarted again this morning. The patient having loose stools, multiple episodes, since morning.  Did not have any abdominal pain. No nausea, but the patient is afraid to eat. The patient's wife states that this morning, had a heart rate of 148 and also chest pain. In the ER, the patient received adenosine, multiple doses, and heart rate was still up in the 100s and so he received metoprolol a total of 10 mg IV push and 20 mg p.o. and the heart rate at the time of my visit was in the 80s and blood pressure was 90/80. The patient says that his chest pain resolved and the patient also denies any complaints. His main concern is diarrhea.   PAST MEDICAL HISTORY:  1.  Significant for a history of chronic atrial fibrillation with sick sinus syndrome status post pacemaker placement.  2.  History of hypertension.   HOME MEDICATIONS:  Aspirin 81 mg daily, Coumadin 3 mg p.o. daily, Cozaar 100 mg daily, Lasix 40 mg p.o. 3 times a day, lovastatin 20 mg p.o. daily, metoprolol 100 mg p.o. daily, KCl 20 mEq p.o. b.i.d.   SOCIAL HISTORY: No smoking. No drinking.   PAST SURGICAL HISTORY: Significant for  bilateral shoulder surgeries, history of pacemaker placement.   FAMILY HISTORY: No hypertension or diabetes.   REVIEW OF SYSTEMS:  CONSTITUTIONAL:  No fever. No fatigue.  EYES: No blurred vision.  ENT: No tinnitus. No epistaxis. No difficulty swallowing.   RESPIRATORY: No cough. No wheezing.  CARDIOVASCULAR: No chest pain, no orthopnea, no PND.  GASTROINTESTINAL: The patient has diarrhea since 9 days and it started after they ate outside 9 days ago. According to the wife, the diarrhea persisted for 4 days and then it stopped and, since last Wednesday, he has not had any diarrhea and then today morning, he started to have very loose stools.  GENITOURINARY: No dysuria.  ENDOCRINE: No polyuria, no nocturia.  HEMATOLOGIC: No anemia.  INTEGUMENTARY: No skin.  MUSCULOSKELETAL: No joint pain.  NEUROLOGIC: No numbness or weakness.  PSYCHIATRIC: No anxiety or insomnia.   PHYSICAL EXAMINATION: VITAL SIGNS: Temperature 97.7, heart rate 148, but it decreased to 80. Blood pressure 113/82 initially; during my visit, blood pressure was 98/80.  GENERAL:  The patient is alert, awake, oriented, well-built, well-nourished male not in distress.  HEAD: Atraumatic, normocephalic.  EYES: Pupils equally reacting to light. No conjunctival pallor. No scleral icterus.  NOSE: No redness. No drainage.  EARS: No drainage. MOUTH: No lesions.   NECK: Supple symmetric. No masses. Thyroid in the midline, not enlarged.  Neck base is nontender.  RESPIRATORY:  Good respiratory effort. Clear to auscultation. No wheeze noted.  CARDIOVASCULAR: S1, S2 regular. Cardiac palpation within normal limits. No pedal edema. GASTROINTESTINAL:  Nontender, nondistended. Bowel sounds present. No organomegaly.  MUSCULOSKELETAL: Normal gait and station. No pathology. Extremities move x4. No tenderness or effusion. There is no lymphadenopathy.  SKIN: Inspection normal.  NEUROLOGIC: Cranial nerves II through XII are intact. Power 5/5 in upper  and lower extremities. DTR are 2+ bilaterally.  Sensation is intact.  (DTR 2+ bilaterally. PSYCHIATRIC: Mood and affect are within normal limits.   LABORATORY DATA AND IMAGING: WBC 10.3, hemoglobin 17.3, hematocrit 53.3, platelets 182,000. INR is 2.3. Troponin less than 0.02. Chest x-ray shows lateral left base atelectasis. CK 128, CPK-MB 3.1. EKG shows demand pacemaker at 105 beats per minute.   ASSESSMENT AND PLAN: The patient is a 79 year old male with chest pain.   1.  Atrial fibrillation with rapid ventricular response. The patient's heart rate finally got better with metoprolol. Admit him to hospitalist service on telemetry. Continue  toprol xl 100 mg daily, and cardiology consult with Dr. Mariah MillingGollan. I believe his tachycardia is likely secondary to his gastrointestinal illness and  dehydration.  2.  Diarrhea and hypertension likely due to gastrointestinal losses causing tachycardia.  Check the stool for Clostridium difficile, ova, parasites, and also cultures, and continue gentle hydration.  3.  History of atrial fibrillation with rapid ventricular response.  He is on Toprol-XL 100 mg daily, so we will continue that.  4.  We will hold the Lasix because of dehydration and diarrhea. Continue IV fluids.   TIME SPENT: About 50 minutes.    ____________________________ Katha HammingSnehalatha , MD sk:lr D: 06/14/2014 16:52:14 ET T: 06/14/2014 18:20:09 ET JOB#: [id number]  cc: Katha HammingSnehalatha , MD, <Dictator> Katha HammingSNEHALATHA  MD ELECTRONICALLY SIGNED 06/21/2014 16:01

## 2015-01-15 NOTE — Consult Note (Signed)
Chief Complaint:  Subjective/Chief Complaint seen for diarrhea-no bm since yesterday pm.  no abdominalpain.   VITAL SIGNS/ANCILLARY NOTES: **Vital Signs.:   24-Sep-15 11:15  Vital Signs Type Routine  Temperature Temperature (F) 97.5  Celsius 36.3  Pulse Pulse 97  Respirations Respirations 18  Systolic BP Systolic BP 121  Diastolic BP (mmHg) Diastolic BP (mmHg) 88  Mean BP 99  Pulse Ox % Pulse Ox % 97  Pulse Ox Activity Level  At rest  Oxygen Delivery Room Air/ 21 %   Brief Assessment:  Cardiac Irregular   Respiratory clear BS   Gastrointestinal details normal Soft  Nontender  Nondistended  No masses palpable   Lab Results: Routine Micro:  21-Sep-15 16:13   Micro Text Report CLOSTRIDIUM DIFFICILE   C.DIFFICILE ANTIGEN       C.DIFFICILE GDH ANTIGEN : NEGATIVE   C.DIFFICILE TOXIN A/B     C.DIFFICILE TOXINS A AND B : NEGATIVE   INTERPRETATION            Negative for C. difficile.    ANTIBIOTIC                        Micro Text Report STOOL COMPREHENSIVE   COMMENT                   NO SALMONELLA OR SHIGELLA ISOLATED   COMMENT                   NO PATHOGENIC E.COLI DETECTED   COMMENT                   NO CAMPYLOBACTER ANTIGEN DETECTED   ANTIBIOTIC                        Culture Comment NO SALMONELLA OR SHIGELLA ISOLATED  Culture Comment . NO PATHOGENIC E.COLI DETECTED  Culture Comment    . NO CAMPYLOBACTER ANTIGEN DETECTED  Result(s) reported on 17 Jun 2014 at 08:06AM.  23-Sep-15 09:48   Micro Text Report WBCS, STOOL   COMMENT                   NO RBC'S OR WBC'S SEEN   ANTIBIOTIC                       Comment .1. NO RBC'S OR WBC'S SEEN  Result(s) reported on 17 Jun 2014 at 12:56PM.  Routine Coag:  23-Sep-15 03:51   Prothrombin  23.5  INR 2.2 (INR reference interval applies to patients on anticoagulant therapy. A single INR therapeutic range for coumarins is not optimal for all indications; however, the suggested range for most indications is 2.0 -  3.0. Exceptions to the INR Reference Range may include: Prosthetic heart valves, acute myocardial infarction, prevention of myocardial infarction, and combinations of aspirin and anticoagulant. The need for a higher or lower target INR must be assessed individually. Reference: The Pharmacology and Management of the Vitamin K  antagonists: the seventh ACCP Conference on Antithrombotic and Thrombolytic Therapy. Chest.2004 Sept:126 (3suppl): [id number]. A HCT value >55% may artifactually increase the PT.  In one study,  the increase was an average of 25%. Reference:  "Effect on Routine and Special Coagulation Testing Values of Citrate Anticoagulant Adjustment in Patients with High HCT Values." American Journal of Clinical Pathology 2006;126:400-405.)   Assessment/Plan:  Assessment/Plan:  Assessment 1) diarrhea-ude, improved.   2) atrial fibrillation   Plan 1)  continue flagyl for 7-10 day course.  will need outpatient GI follow up in about 2 weeks.  Will sign off, reconsult as needed.   Electronic Signatures: Barnetta Chapel (MD)  (Signed 24-Sep-15 15:14)  Authored: Chief Complaint, VITAL SIGNS/ANCILLARY NOTES, Brief Assessment, Lab Results, Assessment/Plan   Last Updated: 24-Sep-15 15:14 by Barnetta Chapel (MD)

## 2015-01-17 LAB — SURGICAL PATHOLOGY

## 2015-01-28 ENCOUNTER — Encounter: Payer: Self-pay | Admitting: Cardiovascular Disease

## 2015-01-28 ENCOUNTER — Ambulatory Visit (INDEPENDENT_AMBULATORY_CARE_PROVIDER_SITE_OTHER): Payer: Medicare Other | Admitting: Cardiovascular Disease

## 2015-01-28 VITALS — BP 110/82 | HR 74 | Ht 71.5 in | Wt 187.5 lb

## 2015-01-28 DIAGNOSIS — I2510 Atherosclerotic heart disease of native coronary artery without angina pectoris: Secondary | ICD-10-CM

## 2015-01-28 DIAGNOSIS — E785 Hyperlipidemia, unspecified: Secondary | ICD-10-CM | POA: Diagnosis not present

## 2015-01-28 DIAGNOSIS — I4891 Unspecified atrial fibrillation: Secondary | ICD-10-CM

## 2015-01-28 DIAGNOSIS — I951 Orthostatic hypotension: Secondary | ICD-10-CM

## 2015-01-28 DIAGNOSIS — I5032 Chronic diastolic (congestive) heart failure: Secondary | ICD-10-CM | POA: Insufficient documentation

## 2015-01-28 DIAGNOSIS — R197 Diarrhea, unspecified: Secondary | ICD-10-CM

## 2015-01-28 NOTE — Assessment & Plan Note (Signed)
Cholesterol is at goal on the current lipid regimen. No changes to the medications were made.  

## 2015-01-28 NOTE — Assessment & Plan Note (Signed)
Chronic diarrhea, managed with Imodium. Followed by GI

## 2015-01-28 NOTE — Progress Notes (Signed)
Patient ID: Kyle Morales, male    DOB: 11-20-1935, 79 y.o.   MRN: [id number]  HPI Comments: Mr. Kyle Bennettutnam is a  79 year old gentleman, patient of Dr. Ferdinand CavaWarren Newton, who lives at [facility] [place], who presents for routine followup of his atrial fibrillation and coronary artery disease. history of paroxysmal atrial fibrillation dating back to [id number].  coronary artery disease with [facility], pacemaker placement in [place] in 2007 for diagnosis of atrial fibrillation, heart block. At that time ejection fraction was normal.  History of nonsustained VT, chronic diarrhea  In follow-up, he continues to have waxing waning loose bowel movements. He takes Imodium as needed Otherwise is active, no complaints, no chest pain or shortness of breath No regular exercise regimen   recently seen by Dr. Achilles Dunkope and had cystoscopy, started on tamsulosin presumably for BPH [facility] He was told that he is not having frequent episodes of atrial fibrillation Blood pressure has been stable, minimal orthostasis. Previously held losartan for low blood pressure  EKG on today's visit shows relatively regular wide-complex rhythm/right bundle branch block, P waves not well appreciated, unable to exclude atrial flutter. No change from prior EKG   Other past medical history  Previous history of atrial fibrillation in the setting of GI distress and diarrhea   cultures were negative. Managed by GI    seen in the emergency room July 15 2014  for cough He was diagnosed with upper respiratory infection.   He went to the ED on 06/14/14 for his chest pain, found to have atrial fibrillation, HR of 148, C diff negative.  started on amiodarone 400 mg bid x 1 week-->200 mg bid-->200 mg daily in an effort to cardiovert him. He converted to NSR for a brief period his first night in the hospital but went back into a-fib and remained in rate controlled a-fib (80s). He was adequately anticoagulated on warfarin at  baseline. He converted to normal sinus rhythm in office follow-up   He had episode of atrial fibrillation August 2014. No further episodes since that time.  He is not doing a regular exercise program, has found his balance is poor.  EKG on today's visit shows normal sinus rhythm with rate 73 bpm, right bundle branch block  history of nonsustained VT. Metoprolol dose was increased from 25-100 mg daily.  Last stress test was 3-4 years ago.  In May 2014 he was traveling, could not take his medications on regular basis and developed significant lower extremity edema. 03/04/2013 he presented to the emergency room atrial fibrillation, palpitations. He was given IV fluids and he believes that this converted him back to normal sinus rhythm. His biggest concern is the CT scan showing right lower lobe pneumonia versus mass lesion. He was given several days of antibiotics and instructed to have followup imaging for this finding in the lower lobe.  [facility] stent placed to his ramus Stress test June 2009 with a normal study, ejection fraction 65%   Allergies  Allergen Reactions  . Amoxicillin     Current Outpatient Prescriptions on File Prior to Visit  Medication Sig Dispense Refill  . amiodarone (PACERONE) 100 MG tablet Take 1 tablet (100 mg total) by mouth daily. 90 tablet 3  . aspirin 81 MG tablet Take 81 mg by mouth daily.    . Furosemide (LASIX PO) Take 120 mg by mouth daily.     Marland Kitchen. lovastatin (MEVACOR) 20 MG tablet Take 20  mg by mouth at bedtime.    . metoprolol succinate (TOPROL-XL) 50 MG 24 hr tablet Take two tablets in the am and one tablet in the pm.    . potassium chloride (K-DUR) 10 MEQ tablet Take 20 mEq by mouth daily.     Marland Kitchen. warfarin (COUMADIN) 1 MG tablet Take 1-2 tablets (1-2 mg total) by mouth daily. 180 tablet 1   No current facility-administered medications on file prior to visit.    Past Medical History  Diagnosis Date  . Hypertension   . Hyperlipidemia    . CHF (congestive heart failure)   . Paroxysmal a-fib   . Coronary artery disease   . Osteoarthrosis   . Eczema   . S/P cardiac pacemaker procedure     Past Surgical History  Procedure Laterality Date  . Cardiac catheterization  1997  . Coronary angioplasty  1997    ramus  . Cardiac catheterization  2000  . Insert / replace / remove pacemaker  12/2005    Medtronic [id number] H - placed in [place]  . Total shoulder replacement      bilateral   . Hernia repair    . Tonsillectomy    . Mandible surgery    . Colonoscopy  2012    Social History  reports that he has never smoked. He does not have any smokeless tobacco history on file. He reports that he drinks alcohol. He reports that he does not use illicit drugs.  Family History family history includes Heart attack in his father; Heart disease in his brother and mother; Hyperlipidemia in his mother; Hypertension in his mother.  Review of Systems  Constitutional: Negative.   Respiratory: Negative.   Cardiovascular: Negative.   Gastrointestinal: Positive for diarrhea.  Neurological: Negative.   All other systems reviewed and are negative.   BP 110/82 mmHg  Pulse 74  Ht 5' 11.5" (1.816 m)  Wt 187 lb 8 oz (85.049 kg)  BMI 25.79 kg/m2  Physical Exam  Constitutional: He is oriented to person, place, and time. He appears well-developed and well-nourished.  HENT:  Head: Normocephalic.  Nose: Nose normal.  Mouth/Throat: Oropharynx is clear and moist.  Eyes: Conjunctivae are normal. Pupils are equal, round, and reactive to light.  Neck: Normal range of motion. Neck supple. No JVD present.  Cardiovascular: Normal rate, regular rhythm, S1 normal, S2 normal, normal heart sounds and intact distal pulses.  Exam reveals no gallop and no friction rub.   No murmur heard. Pulmonary/Chest: Effort normal and breath sounds normal. No respiratory distress. He has no wheezes. He has no rales. He exhibits no tenderness.  Abdominal: Soft.  Bowel sounds are normal. He exhibits no distension. There is no tenderness.  Musculoskeletal: Normal range of motion. He exhibits no edema or tenderness.  Lymphadenopathy:    He has no cervical adenopathy.  Neurological: He is alert and oriented to person, place, and time. Coordination normal.  Skin: Skin is warm and dry. No rash noted. No erythema.  Psychiatric: He has a normal mood and affect. His behavior is normal. Judgment and thought content normal.      Assessment and Plan   Nursing note and vitals reviewed.

## 2015-01-28 NOTE — Assessment & Plan Note (Signed)
He reports minimal atrial fibrillation [facility] Difficult to see P waves, unable to exclude atrial flutter on EKG

## 2015-01-28 NOTE — Assessment & Plan Note (Signed)
[facility] Normal ejection fraction He is on high-dose diuretics as he reports having lower extremity edema without the diuretic Renal function monitored by primary care

## 2015-01-28 NOTE — Assessment & Plan Note (Signed)
Currently with no symptoms of angina. No further workup at this time. Continue current medication regimen. 

## 2015-01-28 NOTE — Patient Instructions (Signed)
You are doing well. No medication changes were made.  Last dose of warfarin/coumadin is Monday before the eye surgery  Please call us if you have new issues that need to be addressed before your next appt.  Your physician wants you to follow-up in: 6 months.  You will receive a reminder letter in the mail two months in advance. If you don't receive a letter, please call our office to schedule the follow-up appointment.

## 2015-01-28 NOTE — Assessment & Plan Note (Signed)
Symptoms stable off losartan. Recently started on tamsulosin. He will continue to monitor his blood pressure and symptoms

## 2015-02-16 ENCOUNTER — Ambulatory Visit (INDEPENDENT_AMBULATORY_CARE_PROVIDER_SITE_OTHER): Payer: Medicare Other | Admitting: *Deleted

## 2015-02-16 DIAGNOSIS — I4891 Unspecified atrial fibrillation: Secondary | ICD-10-CM

## 2015-02-16 DIAGNOSIS — Z5181 Encounter for therapeutic drug level monitoring: Secondary | ICD-10-CM

## 2015-02-16 LAB — POCT INR: INR: 1.4

## 2015-03-02 ENCOUNTER — Ambulatory Visit (INDEPENDENT_AMBULATORY_CARE_PROVIDER_SITE_OTHER): Payer: Medicare Other

## 2015-03-02 DIAGNOSIS — Z5181 Encounter for therapeutic drug level monitoring: Secondary | ICD-10-CM | POA: Diagnosis not present

## 2015-03-02 DIAGNOSIS — I4891 Unspecified atrial fibrillation: Secondary | ICD-10-CM | POA: Diagnosis not present

## 2015-03-02 LAB — POCT INR: INR: 2.5

## 2015-03-23 ENCOUNTER — Ambulatory Visit (INDEPENDENT_AMBULATORY_CARE_PROVIDER_SITE_OTHER): Payer: Medicare Other

## 2015-03-23 DIAGNOSIS — I4891 Unspecified atrial fibrillation: Secondary | ICD-10-CM

## 2015-03-23 DIAGNOSIS — Z5181 Encounter for therapeutic drug level monitoring: Secondary | ICD-10-CM

## 2015-03-23 LAB — POCT INR: INR: 2.3

## 2015-04-20 ENCOUNTER — Ambulatory Visit (INDEPENDENT_AMBULATORY_CARE_PROVIDER_SITE_OTHER): Payer: Medicare Other

## 2015-04-20 DIAGNOSIS — Z5181 Encounter for therapeutic drug level monitoring: Secondary | ICD-10-CM

## 2015-04-20 DIAGNOSIS — I4891 Unspecified atrial fibrillation: Secondary | ICD-10-CM | POA: Diagnosis not present

## 2015-04-20 LAB — POCT INR: INR: 2.1

## 2015-05-02 ENCOUNTER — Other Ambulatory Visit: Payer: Self-pay | Admitting: *Deleted

## 2015-05-02 ENCOUNTER — Telehealth: Payer: Self-pay | Admitting: *Deleted

## 2015-05-02 MED ORDER — LOVASTATIN 20 MG PO TABS: 20.0000 mg | ORAL_TABLET | Freq: Every day | ORAL | Status: DC

## 2015-05-02 NOTE — Telephone Encounter (Signed)
°  1. Which medications need to be refilled? Lovastatin 20 mg   2. Which pharmacy is medication to be sent to? [facility]   3. Do they need a 30 day or 90 day supply? 90 day   4. Would they like a call back once the medication has been sent to the pharmacy? No

## 2015-05-02 NOTE — Telephone Encounter (Signed)
Lovastatin 20 mg #90 R#3 [facility].

## 2015-05-18 ENCOUNTER — Ambulatory Visit (INDEPENDENT_AMBULATORY_CARE_PROVIDER_SITE_OTHER): Payer: Medicare Other | Admitting: Pharmacist

## 2015-05-18 DIAGNOSIS — I4891 Unspecified atrial fibrillation: Secondary | ICD-10-CM | POA: Diagnosis not present

## 2015-05-18 DIAGNOSIS — Z5181 Encounter for therapeutic drug level monitoring: Secondary | ICD-10-CM | POA: Diagnosis not present

## 2015-05-18 LAB — POCT INR: INR: 1.8

## 2015-05-18 MED ORDER — WARFARIN SODIUM 1 MG PO TABS: 2.0000 mg | ORAL_TABLET | Freq: Every day | ORAL | Status: DC

## 2015-06-15 ENCOUNTER — Ambulatory Visit (INDEPENDENT_AMBULATORY_CARE_PROVIDER_SITE_OTHER): Payer: Medicare Other

## 2015-06-15 DIAGNOSIS — I4891 Unspecified atrial fibrillation: Secondary | ICD-10-CM

## 2015-06-15 DIAGNOSIS — Z5181 Encounter for therapeutic drug level monitoring: Secondary | ICD-10-CM

## 2015-06-15 LAB — POCT INR: INR: 2.7

## 2015-07-13 ENCOUNTER — Ambulatory Visit (INDEPENDENT_AMBULATORY_CARE_PROVIDER_SITE_OTHER): Payer: Medicare Other

## 2015-07-13 DIAGNOSIS — Z5181 Encounter for therapeutic drug level monitoring: Secondary | ICD-10-CM

## 2015-07-13 DIAGNOSIS — I4891 Unspecified atrial fibrillation: Secondary | ICD-10-CM | POA: Diagnosis not present

## 2015-07-13 LAB — POCT INR: INR: 2.8

## 2015-07-29 ENCOUNTER — Ambulatory Visit (INDEPENDENT_AMBULATORY_CARE_PROVIDER_SITE_OTHER): Payer: Medicare Other | Admitting: Cardiovascular Disease

## 2015-07-29 ENCOUNTER — Encounter: Payer: Self-pay | Admitting: Cardiovascular Disease

## 2015-07-29 VITALS — BP 108/78 | HR 73 | Ht 72.0 in | Wt 191.0 lb

## 2015-07-29 DIAGNOSIS — J209 Acute bronchitis, unspecified: Secondary | ICD-10-CM

## 2015-07-29 DIAGNOSIS — E785 Hyperlipidemia, unspecified: Secondary | ICD-10-CM

## 2015-07-29 DIAGNOSIS — R531 Weakness: Secondary | ICD-10-CM

## 2015-07-29 DIAGNOSIS — R0602 Shortness of breath: Secondary | ICD-10-CM | POA: Diagnosis not present

## 2015-07-29 DIAGNOSIS — I2510 Atherosclerotic heart disease of native coronary artery without angina pectoris: Secondary | ICD-10-CM | POA: Diagnosis not present

## 2015-07-29 DIAGNOSIS — R5383 Other fatigue: Secondary | ICD-10-CM | POA: Diagnosis not present

## 2015-07-29 DIAGNOSIS — I4891 Unspecified atrial fibrillation: Secondary | ICD-10-CM

## 2015-07-29 DIAGNOSIS — Z950 Presence of cardiac pacemaker: Secondary | ICD-10-CM

## 2015-07-29 DIAGNOSIS — I951 Orthostatic hypotension: Secondary | ICD-10-CM

## 2015-07-29 MED ORDER — AMIODARONE HCL 100 MG PO TABS: 100.0000 mg | ORAL_TABLET | Freq: Every day | ORAL | Status: DC

## 2015-07-29 MED ORDER — NITROGLYCERIN 0.4 MG SL SUBL: 0.4000 mg | SUBLINGUAL_TABLET | SUBLINGUAL | Status: DC | PRN

## 2015-07-29 NOTE — Assessment & Plan Note (Signed)
Chronic mild shortness of breath, worse now in the setting of acute bronchitis  Recommended extra Lasix for leg edema  If symptoms get worse, may need additional antibiotic , even prednisone

## 2015-07-29 NOTE — Assessment & Plan Note (Signed)
Currently with no symptoms of angina. No further workup at this time. Continue current medication regimen. All

## 2015-07-29 NOTE — Assessment & Plan Note (Signed)
Maintaining normal sinus rhythm. We will continue low-dose amiodarone, also on anticoagulation

## 2015-07-29 NOTE — Assessment & Plan Note (Signed)
Managed by EP

## 2015-07-29 NOTE — Assessment & Plan Note (Signed)
Bronchitis symptoms for the past 5-6 weeks.  Z-Pak 2, continued symptoms  We have left a message with primary care, next follow-up is January 2017  He does not want additional course of antibiotic at this time, prefers to wait several more days  Still with significant shortness of breath, cough that is productive , malaise, fatigue

## 2015-07-29 NOTE — Assessment & Plan Note (Signed)
Blood pressures running low. He denies any orthostasis symptoms at this time  Recommended he proceed cautiously with his Lasix to avoid hypotension  Possibly exacerbated by his prostate medication

## 2015-07-29 NOTE — Assessment & Plan Note (Signed)
Cholesterol is at goal on the current lipid regimen. No changes to the medications were made.  

## 2015-07-29 NOTE — Patient Instructions (Addendum)
You still have either resolving or active bronchitis If you do not start to improve in the next week, Please call the office We could start a different medication  Please hold the aspirin  Please call us if you have new issues that need to be addressed before your next appt.  Your physician wants you to follow-up in: 6 months.  You will receive a reminder letter in the mail two months in advance. If you don't receive a letter, please call our office to schedule the follow-up appointment.

## 2015-07-29 NOTE — Progress Notes (Signed)
Patient ID: Kyle Morales, male    DOB: 1936/06/24, 79 y.o.   MRN: [id number]  HPI Comments: Kyle Morales is a  79 year old gentleman, patient of Kyle Morales, who lives at [facility] [place], who presents for routine followup of his atrial fibrillation and coronary artery disease. history of paroxysmal atrial fibrillation dating back to 30.  coronary artery disease with stent placement at [facility] in 1997, pacemaker placement in [place] in 2007 for diagnosis of atrial fibrillation, heart block. At that time ejection fraction was normal.  History of nonsustained VT, chronic diarrhea   in follow-up today, he reports having bronchitis starting over one month ago while he was traveling in [place]  He took a Z-Pak  Symptoms did not improve, seen by primary care and had second Z-Pak  On today's visit, he continues to have significant cough , sputum, malaise, fatigue , shortness of breath on exertion  in the setting of his bronchitis, blood pressure has been running low   He does report having slight worsening of his leg edema , has been taking extra Lasix in addition to his 20 mg daily with potassium  Denies having any abdominal swelling , PND or orthopnea.  He reports his Morales was sick with pneumonia, she is having a difficult time recovering.  no regular exercise regiment prior to his recent bronchitis  Prior history of diarrhea   EKG on today's visit shows normal sinus rhythm with rate 73 bpm, by bundle branch block , diffuse T-wave abnormality   other past medical history  seen by Kyle Morales and had cystoscopy, started on tamsulosin presumably for BPH Also seen by EP at [facility]. He was told that he is not having frequent episodes of atrial fibrillation Blood pressure has been stable, minimal orthostasis. Previously held losartan for low blood pressure  EKG on today's visit shows relatively regular wide-complex rhythm/right bundle branch block, P waves not well appreciated, unable to exclude atrial  flutter. No change from prior EKG  Previous history of atrial fibrillation in the setting of GI distress and diarrhea   cultures were negative. Managed by GI    seen in the emergency room July 15 2014  for cough He was diagnosed with upper respiratory infection.   He went to the ED on 06/14/14 for his chest pain, found to have atrial fibrillation, HR of 148, C diff negative.  started on amiodarone 400 mg bid x 1 week-->200 mg bid-->200 mg daily in an effort to cardiovert him. He converted to NSR for a brief period his first night in the hospital but went back into a-fib and remained in rate controlled a-fib (80s). He was adequately anticoagulated on warfarin at baseline. He converted to normal sinus rhythm in office follow-up   He had episode of atrial fibrillation August 2014. No further episodes since that time.  He is not doing a regular exercise program, has found his balance is poor.  EKG on today's visit shows normal sinus rhythm with rate 73 bpm, right bundle branch block  history of nonsustained VT. Metoprolol dose was increased from 25-100 mg daily.  Last stress test was 3-4 years ago.  In May 2014 he was traveling, could not take his medications on regular basis and developed significant lower extremity edema. 03/04/2013 he presented to the emergency room 79 atrial fibrillation, palpitations. He was given IV fluids and he believes that this converted him back to normal sinus rhythm. His biggest concern is the CT scan showing right lower lobe pneumonia  versus mass lesion. He was given several days of antibiotics and instructed to have followup imaging for this finding in the lower lobe.  [facility] indicate stent placed to his ramus Stress test June 2009 with a normal study, ejection fraction 65%   Allergies  Allergen Reactions  . Amoxicillin     Current Outpatient Prescriptions on File Prior to Visit  Medication Sig Dispense Refill  . Furosemide (LASIX PO) Take 120 mg by  mouth daily.     Marland Kitchen. lovastatin (MEVACOR) 20 MG tablet Take 1 tablet (20 mg total) by mouth at bedtime. 90 tablet 3  . metoprolol succinate (TOPROL-XL) 50 MG 24 hr tablet Take two tablets in the am and one tablet in the pm.    . potassium chloride (K-DUR) 10 MEQ tablet Take 20 mEq by mouth daily.     . tamsulosin (FLOMAX) 0.4 MG CAPS capsule Take 0.4 mg by mouth daily after breakfast.    . warfarin (COUMADIN) 1 MG tablet Take 2-3 tablets (2-3 mg total) by mouth daily. 180 tablet 1   No current facility-administered medications on file prior to visit.    Past Medical History  Diagnosis Date  . Hypertension   . Hyperlipidemia   . CHF (congestive heart failure) (HCC)   . Paroxysmal a-fib (HCC)   . Coronary artery disease   . Osteoarthrosis   . Eczema   . S/P cardiac pacemaker procedure     Past Surgical History  Procedure Laterality Date  . Cardiac catheterization  1997  . Coronary angioplasty  1997    ramus  . Cardiac catheterization  2000  . Insert / replace / remove pacemaker  12/2005    Medtronic [id number] H - placed in [place]  . Total shoulder replacement      bilateral   . Hernia repair    . Tonsillectomy    . Mandible surgery    . Colonoscopy  2012    Social History  reports that he has never smoked. He does not have any smokeless tobacco history on file. He reports that he drinks alcohol. He reports that he does not use illicit drugs.  Family History family history includes Heart attack in his father; Heart disease in his brother and mother; Hyperlipidemia in his mother; Hypertension in his mother.  Review of Systems  Constitutional: Positive for fatigue.  Respiratory: Positive for cough and shortness of breath.   Cardiovascular: Positive for leg swelling.  Gastrointestinal: Negative.   Musculoskeletal: Negative.   Neurological: Negative.   Hematological: Negative.   Psychiatric/Behavioral: Negative.   All other systems reviewed and are negative.   BP 108/78  mmHg  Pulse 73  Ht 6' (1.829 m)  Wt 191 lb (86.637 kg)  BMI 25.90 kg/m2  Physical Exam  Constitutional: He is oriented to person, place, and time. He appears well-developed and well-nourished.  HENT:  Head: Normocephalic.  Nose: Nose normal.  Mouth/Throat: Oropharynx is clear and moist.  Eyes: Conjunctivae are normal. Pupils are equal, round, and reactive to light.  Neck: Normal range of motion. Neck supple. No JVD present.  Cardiovascular: Normal rate, regular rhythm, S1 normal, S2 normal, normal heart sounds and intact distal pulses.  Exam reveals no gallop and no friction rub.   No murmur heard. Pulmonary/Chest: Effort normal. No respiratory distress. He has no wheezes. He has rales. He exhibits no tenderness.  Abdominal: Soft. Bowel sounds are normal. He exhibits no distension. There is no tenderness.  Musculoskeletal: Normal range of motion. He exhibits  no edema or tenderness.  Lymphadenopathy:    He has no cervical adenopathy.  Neurological: He is alert and oriented to person, place, and time. Coordination normal.  Skin: Skin is warm and dry. No rash noted. No erythema.  Psychiatric: He has a normal mood and affect. His behavior is normal. Judgment and thought content normal.      Assessment and Plan   Nursing note and vitals reviewed.

## 2015-08-10 ENCOUNTER — Ambulatory Visit (INDEPENDENT_AMBULATORY_CARE_PROVIDER_SITE_OTHER): Payer: Medicare Other

## 2015-08-10 DIAGNOSIS — Z5181 Encounter for therapeutic drug level monitoring: Secondary | ICD-10-CM | POA: Diagnosis not present

## 2015-08-10 DIAGNOSIS — I4891 Unspecified atrial fibrillation: Secondary | ICD-10-CM | POA: Diagnosis not present

## 2015-08-10 LAB — POCT INR: INR: 2.4

## 2015-08-10 MED ORDER — WARFARIN SODIUM 1 MG PO TABS: 2.0000 mg | ORAL_TABLET | Freq: Every day | ORAL | Status: DC

## 2015-09-14 ENCOUNTER — Ambulatory Visit (INDEPENDENT_AMBULATORY_CARE_PROVIDER_SITE_OTHER): Payer: Medicare Other

## 2015-09-14 DIAGNOSIS — Z5181 Encounter for therapeutic drug level monitoring: Secondary | ICD-10-CM

## 2015-09-14 DIAGNOSIS — I4891 Unspecified atrial fibrillation: Secondary | ICD-10-CM

## 2015-09-14 DIAGNOSIS — K589 Irritable bowel syndrome without diarrhea: Secondary | ICD-10-CM | POA: Insufficient documentation

## 2015-09-14 LAB — POCT INR: INR: 2.2

## 2015-10-22 DIAGNOSIS — I495 Sick sinus syndrome: Secondary | ICD-10-CM | POA: Insufficient documentation

## 2015-10-26 ENCOUNTER — Ambulatory Visit (INDEPENDENT_AMBULATORY_CARE_PROVIDER_SITE_OTHER): Payer: Medicare Other

## 2015-10-26 DIAGNOSIS — Z5181 Encounter for therapeutic drug level monitoring: Secondary | ICD-10-CM

## 2015-10-26 DIAGNOSIS — I4891 Unspecified atrial fibrillation: Secondary | ICD-10-CM | POA: Diagnosis not present

## 2015-10-26 LAB — POCT INR: INR: 2.2

## 2015-11-10 ENCOUNTER — Telehealth: Payer: Self-pay | Admitting: Cardiovascular Disease

## 2015-11-10 NOTE — Telephone Encounter (Signed)
Pt wife calling to let us know pt will be having his battery changed on his device 11/18/15 by [facility] Dr Hurman Horn  Just letting us know.

## 2015-11-18 HISTORY — PX: PACEMAKER GENERATOR CHANGE: SHX5998

## 2015-12-07 ENCOUNTER — Ambulatory Visit (INDEPENDENT_AMBULATORY_CARE_PROVIDER_SITE_OTHER): Payer: Medicare Other

## 2015-12-07 DIAGNOSIS — I4891 Unspecified atrial fibrillation: Secondary | ICD-10-CM

## 2015-12-07 DIAGNOSIS — Z5181 Encounter for therapeutic drug level monitoring: Secondary | ICD-10-CM | POA: Diagnosis not present

## 2015-12-07 LAB — POCT INR: INR: 2.3

## 2015-12-07 MED ORDER — WARFARIN SODIUM 1 MG PO TABS: 2.0000 mg | ORAL_TABLET | Freq: Every day | ORAL | Status: DC

## 2016-01-18 ENCOUNTER — Ambulatory Visit (INDEPENDENT_AMBULATORY_CARE_PROVIDER_SITE_OTHER): Payer: Medicare Other

## 2016-01-18 DIAGNOSIS — I4891 Unspecified atrial fibrillation: Secondary | ICD-10-CM

## 2016-01-18 DIAGNOSIS — Z5181 Encounter for therapeutic drug level monitoring: Secondary | ICD-10-CM | POA: Diagnosis not present

## 2016-01-18 LAB — POCT INR: INR: 2.3

## 2016-01-25 ENCOUNTER — Ambulatory Visit (INDEPENDENT_AMBULATORY_CARE_PROVIDER_SITE_OTHER): Payer: Medicare Other | Admitting: Cardiovascular Disease

## 2016-01-25 ENCOUNTER — Encounter: Payer: Self-pay | Admitting: Cardiovascular Disease

## 2016-01-25 VITALS — BP 98/65 | HR 77 | Ht 72.0 in | Wt 188.8 lb

## 2016-01-25 DIAGNOSIS — I2510 Atherosclerotic heart disease of native coronary artery without angina pectoris: Secondary | ICD-10-CM | POA: Diagnosis not present

## 2016-01-25 DIAGNOSIS — I951 Orthostatic hypotension: Secondary | ICD-10-CM

## 2016-01-25 DIAGNOSIS — I4891 Unspecified atrial fibrillation: Secondary | ICD-10-CM | POA: Diagnosis not present

## 2016-01-25 DIAGNOSIS — E785 Hyperlipidemia, unspecified: Secondary | ICD-10-CM | POA: Diagnosis not present

## 2016-01-25 MED ORDER — LOVASTATIN 20 MG PO TABS: 20.0000 mg | ORAL_TABLET | Freq: Every day | ORAL | Status: DC

## 2016-01-25 NOTE — Assessment & Plan Note (Signed)
Cholesterol is at goal on the current lipid regimen. No changes to the medications were made.  

## 2016-01-25 NOTE — Assessment & Plan Note (Signed)
Currently with no symptoms of angina. No further workup at this time. Continue current medication regimen. 

## 2016-01-25 NOTE — Patient Instructions (Addendum)
You are doing well. No medication changes were made.  If you have severe lightheaded spells when standing, You could hold the lasix for a day Drink more fluids, some salt  Please call us if you have new issues that need to be addressed before your next appt.  Your physician wants you to follow-up in: 6 months.  You will receive a reminder letter in the mail two months in advance. If you don't receive a letter, please call our office to schedule the follow-up appointment.

## 2016-01-25 NOTE — Assessment & Plan Note (Signed)
Paroxysmal atrial fibrillation, no recent episodes that he is aware of No changes to the medications

## 2016-01-25 NOTE — Progress Notes (Signed)
Patient ID: Kyle Morales, male    DOB: 07-31-1936, 80 y.o.   MRN: [id number]  HPI Comments: Kyle Morales is a  80 year old gentleman, patient of Dr. Ferdinand Cava, who lives at [facility] [place], who presents for routine followup of his atrial fibrillation and coronary artery disease. history of paroxysmal atrial fibrillation dating back to 80.  coronary artery disease with stent placement at [facility] in 1997, pacemaker placement in [place] in 2007 for diagnosis of atrial fibrillation, heart block. At that time ejection fraction was normal.  History of nonsustained VT, chronic diarrhea Pacemaker change out done recently at [facility], notes indicating short run of nonsustained VT, 9 seconds  In follow-up today, he reports that he is doing relatively well Denies any significant orthostasis symptoms Continues to take Lasix daily, stable renal function   EKG on today's visit shows normal sinus rhythm with rate 73 bpm, by bundle branch block , diffuse T-wave abnormality   other past medical history  seen by Dr. Achilles Dunk and had cystoscopy, started on tamsulosin presumably for BPH Also seen by EP at [facility]. He was told that he is not having frequent episodes of atrial fibrillation Blood pressure has been stable, minimal orthostasis. Previously held losartan for low blood pressure  Previous history of atrial fibrillation in the setting of GI distress and diarrhea   cultures were negative. Managed by GI   seen in the emergency room July 15 2014  for cough He was diagnosed with upper respiratory infection.   He went to the ED on 06/14/14 for his chest pain, found to have atrial fibrillation, HR of 148, C diff negative.  started on amiodarone 400 mg bid x 1 week-->200 mg bid-->200 mg daily in an effort to cardiovert him. He converted to NSR for a brief period his first night in the hospital but went back into a-fib and remained in rate controlled a-fib (80s). He was adequately anticoagulated on warfarin at baseline. He  converted to normal sinus rhythm in office follow-up   He had episode of atrial fibrillation August 2014. No further episodes since that time.  He is not doing a regular exercise program, has found his balance is poor.  EKG on today's visit shows normal sinus rhythm with rate 73 bpm, right bundle branch block  history of nonsustained VT. Metoprolol dose was increased from 25-100 mg daily.  Last stress test was 3-4 years ago.  In May 2014 he was traveling, could not take his medications on regular basis and developed significant lower extremity edema. 03/04/2013 he presented to the emergency room atrial fibrillation, palpitations. He was given IV fluids and he believes that this converted him back to normal sinus rhythm. His biggest concern is the CT scan showing right lower lobe pneumonia versus mass lesion. He was given several days of antibiotics and instructed to have followup imaging for this finding in the lower lobe.  Notes from [facility] indicate stent placed to his ramus Stress test June 2009 with a normal study, ejection fraction 65%   Allergies  Allergen Reactions  . Amoxicillin     Current Outpatient Prescriptions on File Prior to Visit  Medication Sig Dispense Refill  . amiodarone (PACERONE) 100 MG tablet Take 1 tablet (100 mg total) by mouth daily. 90 tablet 3  . Furosemide (LASIX PO) Take 120 mg by mouth daily.     . metoprolol succinate (TOPROL-XL) 50 MG 24 hr tablet Take two tablets in the am and one tablet in the pm.    .  nitroGLYCERIN (NITROSTAT) 0.4 MG SL tablet Place 1 tablet (0.4 mg total) under the tongue every 5 (five) minutes as needed for chest pain. 25 tablet 1  . potassium chloride (K-DUR) 10 MEQ tablet Take 20 mEq by mouth daily.     . tamsulosin (FLOMAX) 0.4 MG CAPS capsule Take 0.4 mg by mouth daily after breakfast.    . warfarin (COUMADIN) 1 MG tablet Take 2-3 tablets (2-3 mg total) by mouth daily. 180 tablet 1   No current facility-administered medications on  file prior to visit.    Past Medical History  Diagnosis Date  . Hypertension   . Hyperlipidemia   . CHF (congestive heart failure) (HCC)   . Paroxysmal a-fib (HCC)   . Coronary artery disease   . Osteoarthrosis   . Eczema   . S/P cardiac pacemaker procedure     Past Surgical History  Procedure Laterality Date  . Cardiac catheterization  1997  . Coronary angioplasty  1997    ramus  . Cardiac catheterization  2000  . Insert / replace / remove pacemaker  12/2005    Medtronic [id number] H - placed in [place]  . Total shoulder replacement      bilateral   . Hernia repair    . Tonsillectomy    . Mandible surgery    . Colonoscopy  2012    Social History  reports that he has never smoked. He does not have any smokeless tobacco history on file. He reports that he drinks alcohol. He reports that he does not use illicit drugs.  Family History family history includes Heart attack in his father; Heart disease in his brother and mother; Hyperlipidemia in his mother; Hypertension in his mother.  Review of Systems  Constitutional: Positive for fatigue.  Cardiovascular: Negative.   Gastrointestinal: Negative.   Musculoskeletal: Negative.   Neurological: Negative.   Hematological: Negative.   Psychiatric/Behavioral: Negative.   All other systems reviewed and are negative.   BP 98/65 mmHg  Pulse 77  Ht 6' (1.829 m)  Wt 188 lb 12 oz (85.616 kg)  BMI 25.59 kg/m2  Physical Exam  Constitutional: He is oriented to person, place, and time. He appears well-developed and well-nourished.  HENT:  Head: Normocephalic.  Nose: Nose normal.  Mouth/Throat: Oropharynx is clear and moist.  Eyes: Conjunctivae are normal. Pupils are equal, round, and reactive to light.  Neck: Normal range of motion. Neck supple. No JVD present.  Cardiovascular: Normal rate, regular rhythm, S1 normal, S2 normal and intact distal pulses.  Exam reveals no gallop and no friction rub.   No murmur  heard. Pulmonary/Chest: Effort normal. No respiratory distress. He has no wheezes. He has rales. He exhibits no tenderness.  Abdominal: Soft. Bowel sounds are normal. He exhibits no distension. There is no tenderness.  Musculoskeletal: Normal range of motion. He exhibits no edema or tenderness.  Lymphadenopathy:    He has no cervical adenopathy.  Neurological: He is alert and oriented to person, place, and time. Coordination normal.  Skin: Skin is warm and dry. No rash noted. No erythema.  Psychiatric: He has a normal mood and affect. His behavior is normal. Judgment and thought content normal.      Assessment and Plan   Nursing note and vitals reviewed.

## 2016-01-25 NOTE — Assessment & Plan Note (Signed)
Blood pressure is low on today's visit, he is asymptomatic Typically runs 110 systolic per the patient's and per previous office notes No medication changes made, encouraged fluids, salt loading if he becomes symptomatic Potentially could decrease his metoprolol dose

## 2016-02-29 ENCOUNTER — Ambulatory Visit (INDEPENDENT_AMBULATORY_CARE_PROVIDER_SITE_OTHER): Payer: Medicare Other

## 2016-02-29 DIAGNOSIS — I4891 Unspecified atrial fibrillation: Secondary | ICD-10-CM

## 2016-02-29 DIAGNOSIS — Z5181 Encounter for therapeutic drug level monitoring: Secondary | ICD-10-CM | POA: Diagnosis not present

## 2016-02-29 LAB — POCT INR: INR: 2.3

## 2016-04-11 ENCOUNTER — Ambulatory Visit (INDEPENDENT_AMBULATORY_CARE_PROVIDER_SITE_OTHER): Payer: Medicare Other

## 2016-04-11 DIAGNOSIS — I4891 Unspecified atrial fibrillation: Secondary | ICD-10-CM

## 2016-04-11 DIAGNOSIS — Z5181 Encounter for therapeutic drug level monitoring: Secondary | ICD-10-CM | POA: Diagnosis not present

## 2016-04-11 LAB — POCT INR: INR: 2.7

## 2016-05-23 ENCOUNTER — Ambulatory Visit (INDEPENDENT_AMBULATORY_CARE_PROVIDER_SITE_OTHER): Payer: Medicare Other | Admitting: *Deleted

## 2016-05-23 DIAGNOSIS — I4891 Unspecified atrial fibrillation: Secondary | ICD-10-CM

## 2016-05-23 DIAGNOSIS — Z5181 Encounter for therapeutic drug level monitoring: Secondary | ICD-10-CM | POA: Diagnosis not present

## 2016-05-23 LAB — POCT INR: INR: 2.4

## 2016-06-22 DIAGNOSIS — M545 Low back pain, unspecified: Secondary | ICD-10-CM | POA: Insufficient documentation

## 2016-06-22 DIAGNOSIS — R1310 Dysphagia, unspecified: Secondary | ICD-10-CM | POA: Insufficient documentation

## 2016-06-27 ENCOUNTER — Ambulatory Visit (INDEPENDENT_AMBULATORY_CARE_PROVIDER_SITE_OTHER): Payer: Medicare Other

## 2016-06-27 DIAGNOSIS — I4891 Unspecified atrial fibrillation: Secondary | ICD-10-CM | POA: Diagnosis not present

## 2016-06-27 DIAGNOSIS — Z5181 Encounter for therapeutic drug level monitoring: Secondary | ICD-10-CM

## 2016-06-27 LAB — POCT INR: INR: 3.6

## 2016-07-23 ENCOUNTER — Ambulatory Visit (INDEPENDENT_AMBULATORY_CARE_PROVIDER_SITE_OTHER): Payer: Medicare Other

## 2016-07-23 ENCOUNTER — Encounter: Payer: Self-pay | Admitting: Cardiovascular Disease

## 2016-07-23 ENCOUNTER — Ambulatory Visit (INDEPENDENT_AMBULATORY_CARE_PROVIDER_SITE_OTHER): Payer: Medicare Other | Admitting: Cardiovascular Disease

## 2016-07-23 VITALS — BP 90/70 | HR 75 | Ht 70.0 in | Wt 182.0 lb

## 2016-07-23 DIAGNOSIS — I951 Orthostatic hypotension: Secondary | ICD-10-CM

## 2016-07-23 DIAGNOSIS — I10 Essential (primary) hypertension: Secondary | ICD-10-CM | POA: Diagnosis not present

## 2016-07-23 DIAGNOSIS — Z5181 Encounter for therapeutic drug level monitoring: Secondary | ICD-10-CM

## 2016-07-23 DIAGNOSIS — E7800 Pure hypercholesterolemia, unspecified: Secondary | ICD-10-CM | POA: Diagnosis not present

## 2016-07-23 DIAGNOSIS — I4891 Unspecified atrial fibrillation: Secondary | ICD-10-CM

## 2016-07-23 DIAGNOSIS — I214 Non-ST elevation (NSTEMI) myocardial infarction: Secondary | ICD-10-CM | POA: Diagnosis not present

## 2016-07-23 DIAGNOSIS — I2510 Atherosclerotic heart disease of native coronary artery without angina pectoris: Secondary | ICD-10-CM

## 2016-07-23 LAB — POCT INR: INR: 2.3

## 2016-07-23 NOTE — Patient Instructions (Signed)
Medication Instructions:   No medication changes made  For dizziness from low blood rpessure, Drink moore fluids, Cur back on furosemide, Or decrease metoprolol one pill twice a day  Labwork:  No new labs needed  Testing/Procedures:  No further testing at this time   Follow-Up: It was a pleasure seeing you in the office today. Please call us if you have new issues that need to be addressed before your next appt.  [phone]  Your physician wants you to follow-up in: 6 months.  You will receive a reminder letter in the mail two months in advance. If you don't receive a letter, please call our office to schedule the follow-up appointment.  If you need a refill on your cardiac medications before your next appointment, please call your pharmacy.

## 2016-07-23 NOTE — Progress Notes (Signed)
Cardiology Office Note  Date:  07/23/2016   ID:  Kyle Morales, Kyle Morales 1936-04-07, MRN [id number]  PCP:  Kyle Cava, MD   Chief Complaint  Patient presents with  . Atrial Fibrillation    HPI:  Kyle Morales is a  80 year old gentleman, patient of Dr. Ferdinand Morales, who lives at [facility] [place], who presents for routine followup of his atrial fibrillation and coronary artery disease. history of paroxysmal atrial fibrillation dating back to 84.  coronary artery disease with stent placement at [facility] in 1997, pacemaker placement in [place] in 2007 for diagnosis of atrial fibrillation, heart block. At that time ejection fraction was normal.  History of nonsustained VT, chronic diarrhea Pacemaker change out done recently at [facility], notes indicating short run of nonsustained VT, 9 seconds  In follow-up today, he reports that he now sees a different electrophysiologist at [facility] Last office visit at [facility] with EP August 2017 AT/AF burden of <0.1%. 99% of the time his atrial paced, V sensed  Lab work reviewed with him TSH in 11/2015 14.5 TSH 06/01/16 14 Not on thyroid medication BMP normal He seems to be reluctant to start on any medications  On zinc the past few weeks, no diarrhea  He is concerned as blood pressure running low  Denies any significant orthostasis symptoms Continues to take high-dose Lasix daily, recent elevated BUN  EKG on today's visit showing atrial paced rhythm rate 75 bpm  RBBB.    other past medical history  seen by Kyle Morales and had cystoscopy, started on tamsulosin presumably for BPH Also seen by EP at [facility]. He was told that he is not having frequent episodes of atrial fibrillation Blood pressure has been stable, minimal orthostasis. Previously held losartan for low blood pressure  Previous history of atrial fibrillation in the setting of GI distress and diarrhea   cultures were negative. Managed by GI   seen in the emergency room July 15 2014  for cough He was  diagnosed with upper respiratory infection.   He went to the ED on 06/14/14 for his chest pain, found to have atrial fibrillation, HR of 148, C diff negative.  started on amiodarone 400 mg bid x 1 week-->200 mg bid-->200 mg daily in an effort to cardiovert him. He converted to NSR for a brief period his first night in the hospital but went back into a-fib and remained in rate controlled a-fib (80s). He was adequately anticoagulated on warfarin at baseline. He converted to normal sinus rhythm in office follow-up   He had episode of atrial fibrillation August 2014. No further episodes since that time.  He is not doing a regular exercise program, has found his balance is poor.  EKG on today's visit shows normal sinus rhythm with rate 73 bpm, right bundle branch block  history of nonsustained VT. Metoprolol dose was increased from 25-100 mg daily.  Last stress test was 3-4 years ago.  In May 2014 he was traveling, could not take his medications on regular basis and developed significant lower extremity edema. 03/04/2013 he presented to the emergency room atrial fibrillation, palpitations. He was given IV fluids and he believes that this converted him back to normal sinus rhythm. His biggest concern is the CT scan showing right lower lobe pneumonia versus mass lesion. He was given several days of antibiotics and instructed to have followup imaging for this finding in the lower lobe.  Notes from [facility] indicate stent placed to his ramus Stress test June 2009 with a normal  study, ejection fraction 65%   PMH:   has a past medical history of CHF (congestive heart failure) (HCC); Coronary artery disease; Eczema; Hyperlipidemia; Hypertension; Osteoarthrosis; Paroxysmal a-fib (HCC); and S/P cardiac pacemaker procedure.  PSH:    Past Surgical History:  Procedure Laterality Date  . CARDIAC CATHETERIZATION  1997  . CARDIAC CATHETERIZATION  2000  . COLONOSCOPY  2012  . CORONARY ANGIOPLASTY  1997    ramus  . HERNIA REPAIR    . INSERT / REPLACE / REMOVE PACEMAKER  12/2005   Medtronic [id number] H - placed in [place]  . MANDIBLE SURGERY    . TONSILLECTOMY    . TOTAL SHOULDER REPLACEMENT     bilateral     Current Outpatient Prescriptions  Medication Sig Dispense Refill  . acetaminophen (TYLENOL) 500 MG tablet Take 500 mg by mouth every 8 (eight) hours as needed.    Marland Kitchen. amiodarone (PACERONE) 100 MG tablet Take 1 tablet (100 mg total) by mouth daily. 90 tablet 3  . fexofenadine (ALLEGRA) 180 MG tablet Take 180 mg by mouth daily.    . fluticasone (FLONASE) 50 MCG/ACT nasal spray Place 2 sprays into both nostrils 2 (two) times daily.    . Furosemide (LASIX PO) Take 120 mg by mouth daily.     Marland Kitchen. ipratropium (ATROVENT HFA) 17 MCG/ACT inhaler Inhale 2 puffs into the lungs 4 (four) times daily.    Marland Kitchen. lovastatin (MEVACOR) 20 MG tablet Take 1 tablet (20 mg total) by mouth at bedtime. 90 tablet 3  . metoprolol succinate (TOPROL-XL) 50 MG 24 hr tablet Take two tablets in the am and one tablet in the pm.    . nitroGLYCERIN (NITROSTAT) 0.4 MG SL tablet Place 1 tablet (0.4 mg total) under the tongue every 5 (five) minutes as needed for chest pain. 25 tablet 1  . omeprazole (PRILOSEC) 20 MG capsule Take 20 mg by mouth daily.    . potassium chloride (K-DUR) 10 MEQ tablet Take 20 mEq by mouth daily.     . ranitidine (ZANTAC) 150 MG tablet Take 150 mg by mouth daily.    Marland Kitchen. spironolactone (ALDACTONE) 25 MG tablet Take 25 mg by mouth daily.     . tamsulosin (FLOMAX) 0.4 MG CAPS capsule Take 0.4 mg by mouth daily after breakfast.    . warfarin (COUMADIN) 1 MG tablet Take 2-3 tablets (2-3 mg total) by mouth daily. 180 tablet 1   No current facility-administered medications for this visit.      Allergies:   Amoxicillin   Social History:  The patient  reports that he has never smoked. He has never used smokeless tobacco. He reports that he drinks alcohol. He reports that he does not use drugs.   Family  History:   family history includes Heart attack in his father; Heart disease in his brother and mother; Hyperlipidemia in his mother; Hypertension in his mother.    Review of Systems: Review of Systems  Constitutional: Negative.   Respiratory: Negative.   Cardiovascular: Negative.   Gastrointestinal: Negative.   Musculoskeletal: Negative.   Neurological: Negative.   Psychiatric/Behavioral: Negative.   All other systems reviewed and are negative.    PHYSICAL EXAM: VS:  BP 90/70   Pulse 75   Ht 5\' 10"  (1.778 m)   Wt 182 lb (82.6 kg)   SpO2 97%   BMI 26.11 kg/m  , BMI Body mass index is 26.11 kg/m. GEN: Well nourished, well developed, in no acute distress  HEENT: normal  Neck: no JVD,  carotid bruits, or masses Cardiac: RRR; no murmurs, rubs, or gallops,no edema  Respiratory:  clear to auscultation bilaterally, normal work of breathing GI: soft, nontender, nondistended, + BS MS: no deformity or atrophy  Skin: warm and dry, no rash Neuro:  Strength and sensation are intact Psych: euthymic mood, full affect    Recent Labs: No results found for requested labs within last 8760 hours.    Lipid Panel Lab Results  Component Value Date   CHOL 92 06/15/2014   HDL 24 (L) 06/15/2014   LDLCALC 39 06/15/2014   TRIG 144 06/15/2014      Wt Readings from Last 3 Encounters:  07/23/16 182 lb (82.6 kg)  01/25/16 188 lb 12 oz (85.6 kg)  07/29/15 191 lb (86.6 kg)       ASSESSMENT AND PLAN:  Atrial fibrillation, unspecified type (HCC) - Plan: EKG 12-Lead Minimal atrial fibrillation per notes from EP He is concerned about low blood pressure. If he becomes symptomatic, could decrease metoprolol down to 50 mill grams twice a day  Pure hypercholesterolemia Encouraged him to stay on his lovastatin Goal LDL less than 70  Atherosclerosis of native coronary artery of native heart without angina pectoris Currently with no symptoms of angina. No further workup at this time.  Continue current medication regimen.  Orthostatic hypotension Denies any dizziness, only leg weakness Discussed various strategies to manage his low blood pressure including decreasing his Lasix in half, increasing his fluid intake, moving the Flomax to the evening, Decreasing metoprolol succinate down to 50 mgrams twice a day He would like to think about these options first before making any changes  NSTEMI (non-ST elevated myocardial infarction) (HCC)  Hypothyroidism Recommended he talk with Dr. Alvester MorinNewton, consider starting thyroid supplement medication. TSH has been stable at 14 over the course of the past 6 months   Total encounter time more than 25 minutes  Greater than 50% was spent in counseling and coordination of care with the patient   Disposition:   F/U  6 months   Orders Placed This Encounter  Procedures  . EKG 12-Lead     Signed, Dossie Arbourim , M.D., Ph.D. 07/23/2016  [facility], [place] [phone]

## 2016-08-10 DIAGNOSIS — R1313 Dysphagia, pharyngeal phase: Secondary | ICD-10-CM | POA: Insufficient documentation

## 2016-08-22 ENCOUNTER — Ambulatory Visit (INDEPENDENT_AMBULATORY_CARE_PROVIDER_SITE_OTHER): Payer: Medicare Other

## 2016-08-22 DIAGNOSIS — I4891 Unspecified atrial fibrillation: Secondary | ICD-10-CM | POA: Diagnosis not present

## 2016-08-22 DIAGNOSIS — Z5181 Encounter for therapeutic drug level monitoring: Secondary | ICD-10-CM | POA: Diagnosis not present

## 2016-08-22 DIAGNOSIS — I2510 Atherosclerotic heart disease of native coronary artery without angina pectoris: Secondary | ICD-10-CM

## 2016-08-22 LAB — POCT INR: INR: 3.1

## 2016-09-12 ENCOUNTER — Ambulatory Visit (INDEPENDENT_AMBULATORY_CARE_PROVIDER_SITE_OTHER): Payer: Medicare Other

## 2016-09-12 DIAGNOSIS — I4891 Unspecified atrial fibrillation: Secondary | ICD-10-CM | POA: Diagnosis not present

## 2016-09-12 DIAGNOSIS — Z5181 Encounter for therapeutic drug level monitoring: Secondary | ICD-10-CM | POA: Diagnosis not present

## 2016-09-12 DIAGNOSIS — I2510 Atherosclerotic heart disease of native coronary artery without angina pectoris: Secondary | ICD-10-CM

## 2016-09-12 LAB — POCT INR: INR: 2.2

## 2016-10-17 ENCOUNTER — Other Ambulatory Visit: Payer: Self-pay | Admitting: Gastroenterology

## 2016-10-17 ENCOUNTER — Ambulatory Visit (INDEPENDENT_AMBULATORY_CARE_PROVIDER_SITE_OTHER): Payer: Medicare Other

## 2016-10-17 DIAGNOSIS — Z5181 Encounter for therapeutic drug level monitoring: Secondary | ICD-10-CM

## 2016-10-17 DIAGNOSIS — I4891 Unspecified atrial fibrillation: Secondary | ICD-10-CM | POA: Diagnosis not present

## 2016-10-17 DIAGNOSIS — R1310 Dysphagia, unspecified: Secondary | ICD-10-CM

## 2016-10-17 LAB — POCT INR: INR: 2.2

## 2016-10-24 ENCOUNTER — Ambulatory Visit
Admission: RE | Admit: 2016-10-24 | Discharge: 2016-10-24 | Disposition: A | Discharge status: Home / Self Care | Payer: Medicare Other | Source: Ambulatory Visit | Attending: Gastroenterology | Admitting: Gastroenterology

## 2016-10-24 DIAGNOSIS — K222 Esophageal obstruction: Secondary | ICD-10-CM | POA: Insufficient documentation

## 2016-10-24 DIAGNOSIS — I700 Atherosclerosis of aorta: Secondary | ICD-10-CM | POA: Diagnosis not present

## 2016-10-24 DIAGNOSIS — K228 Other specified diseases of esophagus: Secondary | ICD-10-CM | POA: Diagnosis not present

## 2016-10-24 DIAGNOSIS — R1310 Dysphagia, unspecified: Secondary | ICD-10-CM | POA: Diagnosis present

## 2016-11-21 ENCOUNTER — Ambulatory Visit (INDEPENDENT_AMBULATORY_CARE_PROVIDER_SITE_OTHER): Payer: Medicare Other

## 2016-11-21 DIAGNOSIS — I4891 Unspecified atrial fibrillation: Secondary | ICD-10-CM

## 2016-11-21 DIAGNOSIS — Z5181 Encounter for therapeutic drug level monitoring: Secondary | ICD-10-CM

## 2016-11-21 LAB — POCT INR: INR: 2.6

## 2016-12-19 ENCOUNTER — Ambulatory Visit (INDEPENDENT_AMBULATORY_CARE_PROVIDER_SITE_OTHER): Payer: Medicare Other

## 2016-12-19 ENCOUNTER — Other Ambulatory Visit: Payer: Self-pay

## 2016-12-19 DIAGNOSIS — Z5181 Encounter for therapeutic drug level monitoring: Secondary | ICD-10-CM

## 2016-12-19 DIAGNOSIS — I4891 Unspecified atrial fibrillation: Secondary | ICD-10-CM | POA: Diagnosis not present

## 2016-12-19 LAB — POCT INR: INR: 1.1

## 2016-12-19 MED ORDER — AMIODARONE HCL 100 MG PO TABS: 100.0000 mg | ORAL_TABLET | Freq: Every day | ORAL | 0 refills | Status: DC

## 2016-12-19 NOTE — Telephone Encounter (Signed)
Pt seen in Coumadin Clinic, pt requesting refill Amiodarone 100mg  tablets #90 with 3 refills be sent to mail order pharmacy [facility], pt's MD is Dr Mariah MillingGollan.  Thanks

## 2017-01-02 ENCOUNTER — Ambulatory Visit (INDEPENDENT_AMBULATORY_CARE_PROVIDER_SITE_OTHER): Payer: Medicare Other

## 2017-01-02 DIAGNOSIS — I4891 Unspecified atrial fibrillation: Secondary | ICD-10-CM

## 2017-01-02 DIAGNOSIS — Z5181 Encounter for therapeutic drug level monitoring: Secondary | ICD-10-CM | POA: Diagnosis not present

## 2017-01-02 LAB — POCT INR: INR: 2.3

## 2017-01-19 NOTE — Progress Notes (Signed)
Cardiology Office Note  Date:  01/21/2017   ID:  Malikhi, Ogan 02-26-1936, MRN [id number]  PCP:  Ferdinand Cava, MD   Chief Complaint  Patient presents with  . other    53mo f/u. Pt states he is doing well. Pt would like to switch pacemaker checks to this office.  Reviewed meds with pt verbally.    HPI:  Mr. Clarin is a  81 year old gentleman history of  paroxysmal atrial fibrillation dating back to 1992   coronary artery disease with stent placement at [facility] in 1997,  pacemaker placement in [place] in 2007 for diagnosis of heart block nonsustained VT,  chronic diarrhea Pacemaker change out done recently at [facility], short run of nonsustained VT, 9 seconds Who presents for followup of his atrial fibrillation and coronary artery disease.  Previously seen by electrophysiologist at [facility] AT/AF burden of <0.1%. 99% of the time his atrial paced, V sensed He denies any tachycardia or palpitations concerning for arrhythmia  He is on high dose Lasix for ankle swelling Previously noted to have elevated BUN concerning for prerenal state He does not like to wear compression hose Was previously on lower dose, but he reports that he Went up on his lasix to 120 mg daily Now down to 80 mg daily   No recent lab work in the past 2 years  scheduled to see primary care TSH in 11/2015 14.5 TSH 06/01/16 14 Not on thyroid medication  Chronic low blood pressure Weight has been trending lower secondary to medications and weight loss Denies any significant orthostasis symptoms  EKG personally reviewed by myself on todays visit showing atrial paced rhythm rate 71 bpm  RBBB.    other past medical history  seen by Dr. Achilles Dunk and had cystoscopy, started on tamsulosin presumably for BPH Also seen by EP at [facility]. He was told that he is not having frequent episodes of atrial fibrillation Blood pressure has been stable, minimal orthostasis. Previously held losartan for low blood pressure  Previous history  of atrial fibrillation in the setting of GI distress and diarrhea   cultures were negative. Managed by GI   seen in the emergency room July 15 2014  for cough He was diagnosed with upper respiratory infection.   He went to the ED on 06/14/14 for his chest pain, found to have atrial fibrillation, HR of 148, C diff negative.  started on amiodarone 400 mg bid x 1 week-->200 mg bid-->200 mg daily in an effort to cardiovert him. He converted to NSR for a brief period his first night in the hospital but went back into a-fib and remained in rate controlled a-fib (80s). He was adequately anticoagulated on warfarin at baseline. He converted to normal sinus rhythm in office follow-up   He had episode of atrial fibrillation August 2014. No further episodes since that time.  He is not doing a regular exercise program, has found his balance is poor.  EKG on today's visit shows normal sinus rhythm with rate 73 bpm, right bundle branch block  history of nonsustained VT. Metoprolol dose was increased from 25-100 mg daily.  Last stress test was 3-4 years ago.  In May 2014 he was traveling, could not take his medications on regular basis and developed significant lower extremity edema. 03/04/2013 he presented to the emergency room atrial fibrillation, palpitations. He was given IV fluids and he believes that this converted him back to normal sinus rhythm. His biggest concern is the CT scan showing right lower lobe  pneumonia versus mass lesion. He was given several days of antibiotics and instructed to have followup imaging for this finding in the lower lobe.  Notes from [facility] indicate stent placed to his ramus Stress test June 2009 with a normal study, ejection fraction 65%   PMH:   has a past medical history of CHF (congestive heart failure) (HCC); Coronary artery disease; Eczema; Hyperlipidemia; Hypertension; Osteoarthrosis; Paroxysmal A-fib (HCC); and S/P cardiac pacemaker procedure.  PSH:    Past  Surgical History:  Procedure Laterality Date  . CARDIAC CATHETERIZATION  1997  . CARDIAC CATHETERIZATION  2000  . COLONOSCOPY  2012  . CORONARY ANGIOPLASTY  1997   ramus  . HERNIA REPAIR    . INSERT / REPLACE / REMOVE PACEMAKER  12/2005   Medtronic [id number] H - placed in [place]  . MANDIBLE SURGERY    . TONSILLECTOMY    . TOTAL SHOULDER REPLACEMENT     bilateral     Current Outpatient Prescriptions  Medication Sig Dispense Refill  . acetaminophen (TYLENOL) 500 MG tablet Take 500 mg by mouth every 8 (eight) hours as needed.    Marland Kitchen amiodarone (PACERONE) 100 MG tablet Take 1 tablet (100 mg total) by mouth daily. 90 tablet 0  . CVS ZINC 50 MG TABS Take 50 mg by mouth daily.    Marland Kitchen dicyclomine (BENTYL) 10 MG capsule Take 10 mg by mouth 2 (two) times daily.    . fexofenadine (ALLEGRA) 180 MG tablet Take 180 mg by mouth daily.    . fluticasone (FLONASE) 50 MCG/ACT nasal spray Place 2 sprays into both nostrils 2 (two) times daily.    . furosemide (LASIX) 40 MG tablet Take 80 mg by mouth daily.    Marland Kitchen ipratropium (ATROVENT HFA) 17 MCG/ACT inhaler Inhale 2 puffs into the lungs 4 (four) times daily.    Marland Kitchen lovastatin (MEVACOR) 20 MG tablet Take 1 tablet (20 mg total) by mouth at bedtime. 90 tablet 3  . metoprolol succinate (TOPROL-XL) 50 MG 24 hr tablet Take two tablets in the am and one tablet in the pm.    . nitroGLYCERIN (NITROSTAT) 0.4 MG SL tablet Place 1 tablet (0.4 mg total) under the tongue every 5 (five) minutes as needed for chest pain. 25 tablet 1  . potassium chloride (K-DUR) 10 MEQ tablet Take 20 mEq by mouth daily.     . ranitidine (ZANTAC) 150 MG tablet Take 150 mg by mouth daily.    Marland Kitchen spironolactone (ALDACTONE) 25 MG tablet Take 25 mg by mouth daily.     . tamsulosin (FLOMAX) 0.4 MG CAPS capsule Take 0.4 mg by mouth daily after breakfast.    . warfarin (COUMADIN) 1 MG tablet Take 2-3 tablets (2-3 mg total) by mouth daily. 180 tablet 1   No current facility-administered medications for  this visit.      Allergies:   Amoxicillin   Social History:  The patient  reports that he has never smoked. He has never used smokeless tobacco. He reports that he does not drink alcohol or use drugs.   Family History:   family history includes Heart attack in his father; Heart disease in his brother and mother; Hyperlipidemia in his mother; Hypertension in his mother.    Review of Systems: Review of Systems  Constitutional: Negative.   Respiratory: Negative.   Cardiovascular: Negative.   Gastrointestinal: Negative.   Musculoskeletal: Negative.   Neurological: Negative.   Psychiatric/Behavioral: Negative.   All other systems reviewed and are negative.    PHYSICAL  EXAM: VS:  BP 90/66 (BP Location: Left Arm, Patient Position: Sitting, Cuff Size: Normal)   Pulse 71   Ht  (1.803 m)   Wt 175 lb 12 oz (79.7 kg)   BMI 24.51 kg/m  , BMI Body mass index is 24.51 kg/m. GEN: Well nourished, well developed, in no acute distress  HEENT: normal  Neck: no JVD, carotid bruits, or masses Cardiac: RRR; no murmurs, rubs, or gallops,no edema  Respiratory:  clear to auscultation bilaterally, normal work of breathing GI: soft, nontender, nondistended, + BS MS: no deformity or atrophy  Skin: warm and dry, no rash Neuro:  Strength and sensation are intact Psych: euthymic mood, full affect    Recent Labs: No results found for requested labs within last 8760 hours.    Lipid Panel Lab Results  Component Value Date   CHOL 92 06/15/2014   HDL 24 (L) 06/15/2014   LDLCALC 39 06/15/2014   TRIG 144 06/15/2014      Wt Readings from Last 3 Encounters:  01/21/17 175 lb 12 oz (79.7 kg)  07/23/16 182 lb (82.6 kg)  01/25/16 188 lb 12 oz (85.6 kg)       ASSESSMENT AND PLAN:   Atrial fibrillation, unspecified type (HCC) - Plan: EKG 12-Lead Minimal atrial fibrillation per notes from EP He is interested in moving his EP care to our office We will arrange follow-up with Dr.  Graciela Husbands  Pure hypercholesterolemia Encouraged him to stay on his lovastatin Goal LDL less than 70  Atherosclerosis of native coronary artery of native heart without angina pectoris Currently with no symptoms of angina. No further workup at this time. Continue current medication regimen.  Orthostatic hypotension Denies any dizziness, only leg weakness He is on high dose Lasix, recommended he could decrease the dosing and wear compression hose Also on prostate medication Also suggested he could decrease metoprolol succinate down to 50 mgrams twice a day He would like to think about these options first before making any changes as he is asymptomatic  Hypothyroidism No recent lab work available Previous TSH has been stable at 14    Total encounter time more than 25 minutes  Greater than 50% was spent in counseling and coordination of care with the patient   Disposition:   F/U  12 months   Orders Placed This Encounter  Procedures  . Ambulatory referral to Cardiac Electrophysiology  . EKG 12-Lead     Signed, Dossie Arbour, M.D., Ph.D. 01/21/2017  [facility] [place], [place] [phone]

## 2017-01-21 ENCOUNTER — Ambulatory Visit (INDEPENDENT_AMBULATORY_CARE_PROVIDER_SITE_OTHER): Payer: Medicare Other | Admitting: Cardiovascular Disease

## 2017-01-21 ENCOUNTER — Encounter: Payer: Self-pay | Admitting: Cardiovascular Disease

## 2017-01-21 VITALS — BP 90/66 | HR 71 | Ht 71.0 in | Wt 175.8 lb

## 2017-01-21 DIAGNOSIS — I480 Paroxysmal atrial fibrillation: Secondary | ICD-10-CM | POA: Diagnosis not present

## 2017-01-21 DIAGNOSIS — I5032 Chronic diastolic (congestive) heart failure: Secondary | ICD-10-CM

## 2017-01-21 DIAGNOSIS — I10 Essential (primary) hypertension: Secondary | ICD-10-CM | POA: Diagnosis not present

## 2017-01-21 DIAGNOSIS — Z950 Presence of cardiac pacemaker: Secondary | ICD-10-CM

## 2017-01-21 DIAGNOSIS — E782 Mixed hyperlipidemia: Secondary | ICD-10-CM

## 2017-01-21 DIAGNOSIS — I25118 Atherosclerotic heart disease of native coronary artery with other forms of angina pectoris: Secondary | ICD-10-CM | POA: Diagnosis not present

## 2017-01-21 DIAGNOSIS — I209 Angina pectoris, unspecified: Secondary | ICD-10-CM

## 2017-01-21 NOTE — Patient Instructions (Addendum)
We will set up a new patient visit with Dr. Graciela Husbands to establish care   Medication Instructions:   No medication changes made  Labwork:  No new labs needed  Testing/Procedures:  No further testing at this time   I recommend watching educational videos on topics of interest to you at:       [email or web address]  Enter code: [email or web address]    Follow-Up: It was a pleasure seeing you in the office today. Please call us if you have new issues that need to be addressed before your next appt.  [phone]  Your physician wants you to follow-up in: 12 months.  You will receive a reminder letter in the mail two months in advance. If you don't receive a letter, please call our office to schedule the follow-up appointment.  If you need a refill on your cardiac medications before your next appointment, please call your pharmacy.

## 2017-01-30 ENCOUNTER — Ambulatory Visit (INDEPENDENT_AMBULATORY_CARE_PROVIDER_SITE_OTHER): Payer: Medicare Other

## 2017-01-30 DIAGNOSIS — Z5181 Encounter for therapeutic drug level monitoring: Secondary | ICD-10-CM

## 2017-01-30 DIAGNOSIS — I480 Paroxysmal atrial fibrillation: Secondary | ICD-10-CM

## 2017-01-30 DIAGNOSIS — I4891 Unspecified atrial fibrillation: Secondary | ICD-10-CM | POA: Diagnosis not present

## 2017-01-30 LAB — POCT INR: INR: 2.1

## 2017-02-04 ENCOUNTER — Telehealth: Payer: Self-pay | Admitting: Cardiovascular Disease

## 2017-02-04 NOTE — Telephone Encounter (Signed)
Received URGENT cardiac clearance request for pt to proceed w/ EGD on 02/11/17 @ [facility] w/ monitored anesthesia. Please provide clearance and instructions on holding pt's coumadin prior to procedure & [facility] to [facility] @ [phone].

## 2017-02-04 NOTE — Telephone Encounter (Signed)
Acceptable risk for the procedure Would stop warfarin 5 days prior to the procedure Restart warfarin after procedure is complete

## 2017-02-05 ENCOUNTER — Encounter: Payer: Self-pay | Admitting: Internal Medicine

## 2017-02-05 ENCOUNTER — Ambulatory Visit (INDEPENDENT_AMBULATORY_CARE_PROVIDER_SITE_OTHER): Payer: Medicare Other | Admitting: Internal Medicine

## 2017-02-05 VITALS — BP 102/68 | HR 72 | Ht 71.0 in | Wt 176.5 lb

## 2017-02-05 DIAGNOSIS — I480 Paroxysmal atrial fibrillation: Secondary | ICD-10-CM

## 2017-02-05 DIAGNOSIS — Z950 Presence of cardiac pacemaker: Secondary | ICD-10-CM

## 2017-02-05 DIAGNOSIS — I495 Sick sinus syndrome: Secondary | ICD-10-CM | POA: Diagnosis not present

## 2017-02-05 DIAGNOSIS — I2510 Atherosclerotic heart disease of native coronary artery without angina pectoris: Secondary | ICD-10-CM | POA: Diagnosis not present

## 2017-02-05 DIAGNOSIS — Z79899 Other long term (current) drug therapy: Secondary | ICD-10-CM

## 2017-02-05 NOTE — Progress Notes (Signed)
ELECTROPHYSIOLOGY CONSULT NOTE  Patient ID: Kyle Morales, MRN: [id number], DOB/AGE: 1936/02/12 81 y.o. Admit date: (Not on file) Date of Consult: 02/05/2017  Primary Physician: Ferdinand Cava, MD Primary Cardiologist: *TG The patient is seen at his request to establish pacemaker follow-up and issues related to atrial fibrillation    HPI Kyle Morales is a 81 y.o. male  Referred for pacemaker follow-up as well as management of atrial fibrillation.  Initally implanted in [place] for sinus node dysfunction. This is associated with a significant improvement in exercise tolerance he has seen Dr. Hurman Horn at [facility]. He underwent device  generator replacement 2/17 He also has a history of atrial fibrillation dating at least back to 2014  for which he been treated with amiodarone and warfarin.  He has had no bleeding issues. He has had a history of a cough that antedates the use of amiodarone  Denies chest pain or shortness of breath.  Thromboembolic risk profile is notable for age-89 vascular disease-1 hypertension-1 for a CHADS-VASc score 4  Myoview scan 2015 EF 46% without evidence of perfusion abnormalities  he has a history of coronary artery disease with apparently prior stenting  Past Medical History:  Diagnosis Date  . CHF (congestive heart failure) (HCC)   . Coronary artery disease   . Eczema   . Hyperlipidemia   . Hypertension   . Osteoarthrosis   . Paroxysmal A-fib (HCC)   . S/P cardiac pacemaker procedure       Surgical History:  Past Surgical History:  Procedure Laterality Date  . CARDIAC CATHETERIZATION  1997  . CARDIAC CATHETERIZATION  2000  . COLONOSCOPY  2012  . CORONARY ANGIOPLASTY  1997   ramus  . HERNIA REPAIR    . INSERT / REPLACE / REMOVE PACEMAKER  12/2005   Medtronic [id number] - placed in [place]  . MANDIBLE SURGERY    . TONSILLECTOMY    . TOTAL SHOULDER REPLACEMENT     bilateral      Home Meds: Prior to Admission medications   Medication Sig  Start Date End Date Taking? Authorizing Provider  acetaminophen (TYLENOL) 500 MG tablet Take 500 mg by mouth every 8 (eight) hours as needed.   Yes [provider]  amiodarone (PACERONE) 100 MG tablet Take 1 tablet (100 mg total) by mouth daily. 12/19/16  Yes Gollan, Tollie Pizza, MD  [facility] ZINC 50 MG TABS Take 50 mg by mouth daily.   Yes [provider]  dicyclomine (BENTYL) 10 MG capsule Take 10 mg by mouth 2 (two) times daily. 10/17/16  Yes [provider]  fexofenadine (ALLEGRA) 180 MG tablet Take 180 mg by mouth daily.   Yes [provider]  fluticasone (FLONASE) 50 MCG/ACT nasal spray Place 2 sprays into both nostrils 2 (two) times daily. 04/20/16 04/20/17 Yes [provider]  furosemide (LASIX) 40 MG tablet Take 80 mg by mouth daily.   Yes [provider]  ipratropium (ATROVENT HFA) 17 MCG/ACT inhaler Inhale 2 puffs into the lungs 4 (four) times daily. 06/22/16 06/22/17 Yes [provider]  lovastatin (MEVACOR) 20 MG tablet Take 1 tablet (20 mg total) by mouth at bedtime. 01/25/16  Yes Gollan, Tollie Pizza, MD  metoprolol succinate (TOPROL-XL) 50 MG 24 hr tablet Take two tablets in the am and one tablet in the pm.   Yes [provider]  nitroGLYCERIN (NITROSTAT) 0.4 MG SL tablet Place 1 tablet (0.4 mg total) under the tongue every 5 (five) minutes as needed for  chest pain. 07/29/15  Yes Gollan, Tollie Pizza, MD  potassium chloride (K-DUR) 10 MEQ tablet Take 20 mEq by mouth daily.    Yes [provider]  ranitidine (ZANTAC) 150 MG tablet Take 150 mg by mouth daily.   Yes [provider]  spironolactone (ALDACTONE) 25 MG tablet Take 25 mg by mouth daily.  11/29/15  Yes [provider]  tamsulosin (FLOMAX) 0.4 MG CAPS capsule Take 0.4 mg by mouth daily after breakfast.   Yes [provider]  warfarin (COUMADIN) 1 MG tablet Take 2-3 tablets (2-3 mg total) by mouth daily. 12/07/15  Yes Antonieta Iba, MD       Allergies:  Allergies  Allergen Reactions  . Amoxicillin     Social History   Social History  . Marital status: Married    Spouse name: N/A  . Number of children: N/A  . Years of education: N/A   Occupational History  . Not on file.   Social History Main Topics  . Smoking status: Never Smoker  . Smokeless tobacco: Never Used  . Alcohol use No     Comment: social drinker; 1 bottle of beer every 2 weeks.  . Drug use: No  . Sexual activity: Not on file   Other Topics Concern  . Not on file   Social History Narrative  . No narrative on file     Family History  Problem Relation Age of Onset  . Heart disease Mother   . Hypertension Mother   . Hyperlipidemia Mother   . Heart attack Father   . Heart disease Brother      ROS:  Please see the history of present illness.     All other systems reviewed and negative.    Physical Exam: Blood pressure 102/68, pulse 72, height 5\' 11"  (1.803 m), weight 176 lb 8 oz (80.1 kg). General: Well developed, well nourished male in no acute distress. Head: Normocephalic, atraumatic, sclera non-icteric, no xanthomas, nares are without discharge. EENT: normal Lymph Nodes:  none Back: without scoliosis/kyphosis , no CVA tendersness Neck: Negative for carotid bruits. JVD not elevated. Lungs: Clear bilaterally to auscultation without wheezes, rales, or rhonchi. Breathing is unlabored. Heart: RRR with S1 S2. No  murmur , rubs, or gallops appreciated. Abdomen: Soft, non-tender, non-distended with normoactive bowel sounds. No hepatomegaly. No rebound/guarding. No obvious abdominal masses. Msk:  Strength and tone appear normal for age. Extremities: No clubbing or cyanosis. No* edema.  Distal pedal pulses are 2+ and equal bilaterally. Skin: Warm and Dry Neuro: Alert and oriented X 3. CN III-XII intact Grossly normal sensory and motor function . Psych:  Responds to questions appropriately with a normal affect.      Labs: Cardiac  Enzymes No results for input(s): CKTOTAL, CKMB, TROPONINI in the last 72 hours. CBC Lab Results  Component Value Date   WBC 9.5 09/22/2014   HGB 17.8 09/22/2014   HCT 52.7 (H) 09/22/2014   MCV 96 09/22/2014   PLT 158 09/22/2014   PROTIME: No results for input(s): LABPROT, INR in the last 72 hours. Chemistry No results for input(s): NA, K, CL, CO2, BUN, CREATININE, CALCIUM, PROT, BILITOT, ALKPHOS, ALT, AST, GLUCOSE in the last 168 hours.  Invalid input(s): LABALBU Lipids Lab Results  Component Value Date   CHOL 92 06/15/2014   HDL 24 (L) 06/15/2014   LDLCALC 39 06/15/2014   TRIG 144 06/15/2014   BNP No results found for: PROBNP Thyroid Function Tests: No results for input(s): TSH, T4TOTAL,  T3FREE, THYROIDAB in the last 72 hours.  Invalid input(s): FREET3    Miscellaneous No results found for: DDIMER  Radiology/Studies:  No results found.  EKG: atriial pacing tith intrinsic conduction*   Assessment and Plan:  Atrial fibrillation-paroxysmal  Sinus node dysfunction  Pacemaker-Medtronic  Cardiomyopathy   [name] has infrequent paroxysmal atrial fibrillation.  Rates in atrial fibrillation has not been fast We'll plan to discontinue his amiodarone and follow this along.  We discussed the use of the NOACs compared to Coumadin. We briefly reviewed the data of at least comparability in stroke prevention, bleeding and outcome. We discussed some of the new once wherein somewhat associated with decreased ischemic stroke risk, one to be taken daily, and has been shown to be comparable and bleeding risk to aspirin.  We also discussed bleeding associated with warfarin as well as NOACs and a wall bleeding as a complication of all these drugs intracranial bleeding is more frequently associated with warfarin then the NOACs and a GI bleeding is more commonly associated with the latter  I've asked him to follow-up [facility] whom he has had a  long-standing relationship to discuss with him the role of the NOACs versus Coumadin. I would favor the of ELIQUIS  With his low-grade cardiomyopathy we will repeat evaluation of LV systolic function  Sherryl MangesSteven

## 2017-02-05 NOTE — Telephone Encounter (Signed)
Routed to fax # provided. 

## 2017-02-05 NOTE — Patient Instructions (Addendum)
Medication Instructions: Your physician has recommended you make the following change in your medication:  STOP taking amiodarone   Labwork: Your physician recommends that you return for lab work the day of your echo: CMET/ TSH/ Lipid/ CBC  Procedures/Testing: Your physician has requested that you have an echocardiogram. Echocardiography is a painless test that uses sound waves to create images of your heart. It provides your doctor with information about the size and shape of your heart and how well your heart's chambers and valves are working. This procedure takes approximately one hour. There are no restrictions for this procedure.    Follow-Up: - Remote monitoring is used to monitor your Pacemaker of ICD from home. This monitoring reduces the number of office visits required to check your device to one time per year. It allows us to keep an eye on the functioning of your device to ensure it is working properly. You are scheduled for a device check from home on 05/07/17. You may send your transmission at any time that day. If you have a wireless device, the transmission will be sent automatically. After your physician reviews your transmission, you will receive a postcard with your next transmission date.  - Your physician wants you to follow-up in: 6 months with Dr. Graciela HusbandsKlein. You will receive a reminder letter in the mail two months in advance. If you don't receive a letter, please call our office to schedule the follow-up appointment.   Any Additional Special Instructions Will Be Listed Below (If Applicable).     If you need a refill on your cardiac medications before your next appointment, please call your pharmacy.  Echocardiogram An echocardiogram, or echocardiography, uses sound waves (ultrasound) to produce an image of your heart. The echocardiogram is simple, painless, obtained within a short period of time, and offers valuable information to your health care provider. The images  from an echocardiogram can provide information such as:  Evidence of coronary artery disease (CAD).  Heart size.  Heart muscle function.  Heart valve function.  Aneurysm detection.  Evidence of a past heart attack.  Fluid buildup around the heart.  Heart muscle thickening.  Assess heart valve function. Tell a health care provider about:  Any allergies you have.  All medicines you are taking, including vitamins, herbs, eye drops, creams, and over-the-counter medicines.  Any problems you or family members have had with anesthetic medicines.  Any blood disorders you have.  Any surgeries you have had.  Any medical conditions you have.  Whether you are pregnant or may be pregnant. What happens before the procedure? No special preparation is needed. Eat and drink normally. What happens during the procedure?  In order to produce an image of your heart, gel will be applied to your chest and a wand-like tool (transducer) will be moved over your chest. The gel will help transmit the sound waves from the transducer. The sound waves will harmlessly bounce off your heart to allow the heart images to be captured in real-time motion. These images will then be recorded.  You may need an IV to receive a medicine that improves the quality of the pictures. What happens after the procedure? You may return to your normal schedule including diet, activities, and medicines, unless your health care provider tells you otherwise. This information is not intended to replace advice given to you by your health care provider. Make sure you discuss any questions you have with your health care provider. Document Released: 09/07/2000 Document Revised: 04/28/2016 Document Reviewed:  05/18/2013 Elsevier Interactive Patient Education  2017 [employer].

## 2017-02-11 ENCOUNTER — Ambulatory Visit: Payer: Medicare Other | Admitting: Anesthesiology

## 2017-02-11 ENCOUNTER — Telehealth: Payer: Self-pay | Admitting: Internal Medicine

## 2017-02-11 ENCOUNTER — Ambulatory Visit
Admission: RE | Admit: 2017-02-11 | Discharge: 2017-02-11 | Disposition: A | Discharge status: Home / Self Care | Payer: Medicare Other | Source: Ambulatory Visit | Attending: Gastroenterology | Admitting: Gastroenterology

## 2017-02-11 ENCOUNTER — Encounter: Payer: Self-pay | Admitting: *Deleted

## 2017-02-11 ENCOUNTER — Other Ambulatory Visit: Payer: Self-pay | Admitting: *Deleted

## 2017-02-11 ENCOUNTER — Other Ambulatory Visit: Payer: Self-pay

## 2017-02-11 ENCOUNTER — Encounter
Admission: RE | Disposition: A | Discharge status: Home / Self Care | Payer: Self-pay | Source: Ambulatory Visit | Attending: Gastroenterology

## 2017-02-11 DIAGNOSIS — I2510 Atherosclerotic heart disease of native coronary artery without angina pectoris: Secondary | ICD-10-CM | POA: Diagnosis not present

## 2017-02-11 DIAGNOSIS — M1990 Unspecified osteoarthritis, unspecified site: Secondary | ICD-10-CM | POA: Insufficient documentation

## 2017-02-11 DIAGNOSIS — Z950 Presence of cardiac pacemaker: Secondary | ICD-10-CM | POA: Insufficient documentation

## 2017-02-11 DIAGNOSIS — K222 Esophageal obstruction: Secondary | ICD-10-CM | POA: Diagnosis not present

## 2017-02-11 DIAGNOSIS — Z7901 Long term (current) use of anticoagulants: Secondary | ICD-10-CM | POA: Insufficient documentation

## 2017-02-11 DIAGNOSIS — K2210 Ulcer of esophagus without bleeding: Secondary | ICD-10-CM | POA: Insufficient documentation

## 2017-02-11 DIAGNOSIS — E785 Hyperlipidemia, unspecified: Secondary | ICD-10-CM | POA: Insufficient documentation

## 2017-02-11 DIAGNOSIS — I509 Heart failure, unspecified: Secondary | ICD-10-CM | POA: Diagnosis not present

## 2017-02-11 DIAGNOSIS — K219 Gastro-esophageal reflux disease without esophagitis: Secondary | ICD-10-CM | POA: Insufficient documentation

## 2017-02-11 DIAGNOSIS — I480 Paroxysmal atrial fibrillation: Secondary | ICD-10-CM | POA: Diagnosis not present

## 2017-02-11 DIAGNOSIS — I110 Hypertensive heart disease with heart failure: Secondary | ICD-10-CM | POA: Insufficient documentation

## 2017-02-11 DIAGNOSIS — Z880 Allergy status to penicillin: Secondary | ICD-10-CM | POA: Insufficient documentation

## 2017-02-11 DIAGNOSIS — K449 Diaphragmatic hernia without obstruction or gangrene: Secondary | ICD-10-CM | POA: Insufficient documentation

## 2017-02-11 DIAGNOSIS — R1310 Dysphagia, unspecified: Secondary | ICD-10-CM | POA: Diagnosis present

## 2017-02-11 DIAGNOSIS — B3781 Candidal esophagitis: Secondary | ICD-10-CM | POA: Diagnosis not present

## 2017-02-11 DIAGNOSIS — Z79899 Other long term (current) drug therapy: Secondary | ICD-10-CM | POA: Insufficient documentation

## 2017-02-11 DIAGNOSIS — K2940 Chronic atrophic gastritis without bleeding: Secondary | ICD-10-CM | POA: Insufficient documentation

## 2017-02-11 HISTORY — PX: ESOPHAGOGASTRODUODENOSCOPY: SHX5428

## 2017-02-11 HISTORY — DX: Presence of cardiac pacemaker: Z95.0

## 2017-02-11 LAB — CBC WITH DIFFERENTIAL/PLATELET
BASOS PCT: 1 %
Basophils Absolute: 0 10*3/uL (ref 0–0.1)
EOS ABS: 0.3 10*3/uL (ref 0–0.7)
EOS PCT: 5 %
HCT: 42.6 % (ref 40.0–52.0)
Hemoglobin: 14.8 g/dL (ref 13.0–18.0)
LYMPHS ABS: 1.2 10*3/uL (ref 1.0–3.6)
Lymphocytes Relative: 19 %
MCH: 33.9 pg (ref 26.0–34.0)
MCHC: 34.7 g/dL (ref 32.0–36.0)
MCV: 97.6 fL (ref 80.0–100.0)
MONOS PCT: 10 %
Monocytes Absolute: 0.6 10*3/uL (ref 0.2–1.0)
Neutro Abs: 4.2 10*3/uL (ref 1.4–6.5)
Neutrophils Relative %: 65 %
Platelets: 142 10*3/uL — ABNORMAL LOW (ref 150–440)
RBC: 4.37 MIL/uL — ABNORMAL LOW (ref 4.40–5.90)
RDW: 13.2 % (ref 11.5–14.5)
WBC: 6.3 10*3/uL (ref 3.8–10.6)

## 2017-02-11 LAB — KOH PREP: KOH Prep: NONE SEEN

## 2017-02-11 LAB — PROTIME-INR
INR: 1.07
PROTHROMBIN TIME: 13.9 s (ref 11.4–15.2)

## 2017-02-11 SURGERY — EGD (ESOPHAGOGASTRODUODENOSCOPY)
Anesthesia: General

## 2017-02-11 MED ORDER — SODIUM CHLORIDE 0.9 % IV SOLN
INTRAVENOUS | Status: DC
  Administered 2017-02-11 (×2): via INTRAVENOUS

## 2017-02-11 MED ORDER — PHENYLEPHRINE HCL 10 MG/ML IJ SOLN
INTRAMUSCULAR | Status: DC | PRN
  Administered 2017-02-11 (×8): 100 ug via INTRAVENOUS

## 2017-02-11 MED ORDER — LIDOCAINE HCL 2 % IJ SOLN
INTRAMUSCULAR | Status: AC
  Filled 2017-02-11: qty 10

## 2017-02-11 MED ORDER — PROPOFOL 500 MG/50ML IV EMUL
INTRAVENOUS | Status: AC
  Filled 2017-02-11: qty 50

## 2017-02-11 MED ORDER — EPHEDRINE SULFATE 50 MG/ML IJ SOLN
INTRAMUSCULAR | Status: AC
  Filled 2017-02-11: qty 1

## 2017-02-11 MED ORDER — PROPOFOL 500 MG/50ML IV EMUL
INTRAVENOUS | Status: DC | PRN
  Administered 2017-02-11: 100 ug/kg/min via INTRAVENOUS

## 2017-02-11 MED ORDER — EPHEDRINE SULFATE 50 MG/ML IJ SOLN
INTRAMUSCULAR | Status: DC | PRN
  Administered 2017-02-11 (×5): 10 mg via INTRAVENOUS

## 2017-02-11 MED ORDER — PROPOFOL 10 MG/ML IV BOLUS
INTRAVENOUS | Status: DC | PRN
  Administered 2017-02-11: 40 mg via INTRAVENOUS

## 2017-02-11 MED ORDER — LIDOCAINE HCL (CARDIAC) 20 MG/ML IV SOLN
INTRAVENOUS | Status: DC | PRN
  Administered 2017-02-11: 30 mg via INTRAVENOUS

## 2017-02-11 MED ORDER — SODIUM CHLORIDE 0.9 % IV SOLN: INTRAVENOUS | Status: DC

## 2017-02-11 NOTE — Anesthesia Postprocedure Evaluation (Signed)
Anesthesia Post Note  Patient: Kyle Morales  Procedure(s) Performed: Procedure(s) (LRB): ESOPHAGOGASTRODUODENOSCOPY (EGD) (N/A)  Patient location during evaluation: Endoscopy Anesthesia Type: General Level of consciousness: awake and alert Pain management: pain level controlled Vital Signs Assessment: post-procedure vital signs reviewed and stable Respiratory status: spontaneous breathing and respiratory function stable Cardiovascular status: stable Anesthetic complications: no     Last Vitals:  Vitals:   02/11/17 1230 02/11/17 1240  BP: 92/74   Pulse: 69 71  Resp: 17 19  Temp:      Last Pain:  Vitals:   02/11/17 1220  TempSrc: Tympanic  PainSc: Asleep                 , K

## 2017-02-11 NOTE — H&P (Signed)
Outpatient short stay form Pre-procedure 02/11/2017 11:11 AM Kyle Morales U  MD  Primary Physician: Dr. Ferdinand CavaWarren Newton  Reason for visit:  EGD  History of present illness:  Patient is a 81 year old male presenting today as above. He does have a history of intermittent dysphagia and has for at least several months. He had a barium swallow on 10/24/2016. This showed a transit pain of the barium tablet at the GE junction which then passed into the stomach with an additional sip water. 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 There is no evidence of high-grade stricture. We discussed esophageal dilatation today. He did have a pro time done today with a 1.07 INR. I did order a CBC with differential this showing him tablet platelet count of 142 as he is shown a low platelet count in the past. He takes no other blood thinner with the exception of his Coumadin which she has held for an appropriate time.    Current Facility-Administered Medications:  .  0.9 %  sodium chloride infusion, , Intravenous, Continuous, Kyle Morales,  U, MD .  0.9 %  sodium chloride infusion, , Intravenous, Continuous, Kyle Morales,  U, MD  Prescriptions Prior to Admission  Medication Sig Dispense Refill Last Dose  . amiodarone (PACERONE) 100 MG tablet Take 100 mg by mouth daily.   02/10/2017 at Unknown time  . [facility] ZINC 50 MG TABS Take 50 mg by mouth daily.   02/10/2017 at Unknown time  . dicyclomine (BENTYL) 10 MG capsule Take 10 mg by mouth 2 (two) times daily.   02/10/2017 at Unknown time  . fluticasone (FLONASE) 50 MCG/ACT nasal spray Place 2 sprays into both nostrils 2 (two) times daily.   02/10/2017 at Unknown time  . furosemide (LASIX) 40 MG tablet Take 80 mg by mouth daily.   02/10/2017 at Unknown time  . ipratropium (ATROVENT HFA) 17 MCG/ACT inhaler Inhale 2 puffs into the lungs 4 (four) times daily.   02/10/2017 at Unknown time  . lovastatin (MEVACOR) 20 MG tablet Take 1 tablet (20 mg total) by mouth at bedtime. 90 tablet 3  02/10/2017 at Unknown time  . metoprolol succinate (TOPROL-XL) 50 MG 24 hr tablet Take two tablets in the am and one tablet in the pm.   02/11/2017 at 0800  . potassium chloride (K-DUR) 10 MEQ tablet Take 20 mEq by mouth daily.    02/10/2017 at Unknown time  . spironolactone (ALDACTONE) 25 MG tablet Take 25 mg by mouth daily.    02/10/2017 at Unknown time  . tamsulosin (FLOMAX) 0.4 MG CAPS capsule Take 0.4 mg by mouth daily after breakfast.   02/10/2017 at Unknown time  . acetaminophen (TYLENOL) 500 MG tablet Take 500 mg by mouth every 8 (eight) hours as needed.   Taking  . fexofenadine (ALLEGRA) 180 MG tablet Take 180 mg by mouth daily.   Not Taking at Unknown time  . nitroGLYCERIN (NITROSTAT) 0.4 MG SL tablet Place 1 tablet (0.4 mg total) under the tongue every 5 (five) minutes as needed for chest pain. 25 tablet 1 Taking  . ranitidine (ZANTAC) 150 MG tablet Take 150 mg by mouth daily.   Not Taking at Unknown time  . warfarin (COUMADIN) 1 MG tablet Take 2-3 tablets (2-3 mg total) by mouth daily. 180 tablet 1 02/05/2017     Allergies  Allergen Reactions  . Amoxicillin Rash     Past Medical History:  Diagnosis Date  . CHF (congestive heart failure) (HCC)   . Coronary artery disease   .  Eczema   . Hyperlipidemia   . Hypertension   . Osteoarthrosis   . Paroxysmal A-fib (HCC)   . Presence of permanent cardiac pacemaker   . S/P cardiac pacemaker procedure     Review of systems:      Physical Exam    Heart and lungs: Regular rate and rhythm without rub or gallop, lungs are bilaterally clear    HEENT: Normocephalic atraumatic eyes are anicteric    Other:     Pertinant exam for procedure: Soft nontender nondistended bowel sounds positive normoactive.    Planned proceedures: EGD and indicated procedures. I have discussed the risks benefits and complications of procedures to include not limited to bleeding, infection, perforation and the risk of sedation and the patient wishes to  proceed.    Kyle Deem, MD Gastroenterology 02/11/2017  11:11 AM

## 2017-02-11 NOTE — Anesthesia Preprocedure Evaluation (Signed)
Anesthesia Evaluation  Patient identified by MRN, date of birth, ID band Patient awake    Reviewed: Allergy & Precautions, NPO status , Patient's Chart, lab work & pertinent test results  History of Anesthesia Complications Negative for: history of anesthetic complications  Airway Mallampati: II       Dental   Pulmonary neg pulmonary ROS,           Cardiovascular hypertension, Pt. on medications and Pt. on home beta blockers + CAD and +CHF  + dysrhythmias Atrial Fibrillation + pacemaker      Neuro/Psych negative neurological ROS     GI/Hepatic Neg liver ROS, GERD  Medicated and Controlled,  Endo/Other  negative endocrine ROS  Renal/GU negative Renal ROS     Musculoskeletal   Abdominal   Peds  Hematology negative hematology ROS (+)   Anesthesia Other Findings   Reproductive/Obstetrics                             Anesthesia Physical Anesthesia Plan  ASA: III  Anesthesia Plan: General   Post-op Pain Management:    Induction: Intravenous  Airway Management Planned: Nasal Cannula  Additional Equipment:   Intra-op Plan:   Post-operative Plan:   Informed Consent: I have reviewed the patients History and Physical, chart, labs and discussed the procedure including the risks, benefits and alternatives for the proposed anesthesia with the patient or authorized representative who has indicated his/her understanding and acceptance.     Plan Discussed with:   Anesthesia Plan Comments:         Anesthesia Quick Evaluation

## 2017-02-11 NOTE — Transfer of Care (Signed)
Immediate Anesthesia Transfer of Care Note  Patient: Kyle Morales  Procedure(s) Performed: Procedure(s): ESOPHAGOGASTRODUODENOSCOPY (EGD) (N/A)  Patient Location: PACU  Anesthesia Type:General  Level of Consciousness: awake, oriented and sedated  Airway & Oxygen Therapy: Patient Spontanous Breathing and Patient connected to nasal cannula oxygen  Post-op Assessment: Report given to RN and Post -op Vital signs reviewed and stable  Post vital signs: Reviewed and stable  Last Vitals:  Vitals:   02/11/17 0948 02/11/17 1220  BP: 99/77 (!) 85/61  Pulse: 94 70  Resp: 20 18  Temp: (!) 35.8 C (!) 35.9 C    Last Pain:  Vitals:   02/11/17 1220  TempSrc: Tympanic  PainSc: Asleep         Complications: No apparent anesthesia complications

## 2017-02-11 NOTE — Telephone Encounter (Signed)
New Message:   Verlon AuLeslie From [facility] is Calling to speaak to Dr Graciela HusbandsKlein nurse or a triage nurse-

## 2017-02-11 NOTE — Telephone Encounter (Signed)
Received incoming call from Verlon AuLeslie, nurse at [facility]. Per pt request, nurse contacted our office to release lab orders to be drawn at the hospital today. Reviewed last office note. Pt is scheduled to have labs and echo on 02/20/17 at the [place] office. Informed cannot release lab orders and pt will need to keep future lab appt on 02/20/17. Verlon AuLeslie verbalized understanding.

## 2017-02-11 NOTE — Anesthesia Post-op Follow-up Note (Cosign Needed)
Anesthesia QCDR form completed.        

## 2017-02-11 NOTE — Op Note (Addendum)
[facility] Gastroenterology Patient Name: Kyle Morales Procedure Date: 02/11/2017 11:40 AM MRN: [id number] Account #: [id number] Date of Birth: May 29, 1936 Admit Type: Outpatient Age: 81 Room: [facility] ENDO ROOM 3 Gender: Male Note Status: Finalized Procedure:            Upper GI endoscopy Indications:          Dysphagia Providers:            Christena Deem, MD Referring MD:         Ferdinand Cava (Referring MD) Medicines:            Monitored Anesthesia Care Complications:        No immediate complications. Procedure:            Pre-Anesthesia Assessment:                       - ASA Grade Assessment: III - A patient with severe                        systemic disease.                       After obtaining informed consent, the endoscope was                        passed under direct vision. Throughout the procedure,                        the patient's blood pressure, pulse, and oxygen                        saturations were monitored continuously. The Endoscope                        was introduced through the mouth, and advanced to the                        third part of duodenum. The upper GI endoscopy was                        accomplished without difficulty. The patient tolerated                        the procedure well. Findings:      A intermittant Schatzki ring (acquired) was found at the       gastroesophageal junction. A TTS dilator was passed through the scope.       Dilation with a 15-16.5-18 mm balloon dilator was performed to 16 mm.      LA Grade B (one or more mucosal breaks greater than 5 mm, not extending       between the tops of two mucosal folds) esophagitis with no bleeding was       found. Biopsies were taken with a cold forceps for histology.      Patchy/diffuse candidiasis was found in the upper third of the       esophagus, in the middle third of the esophagus and in the lower third       of the esophagus. Cells for cytology were  obtained by brushing.      A small hiatal hernia was found. The Z-line was a variable distance from  incisors; the hiatal hernia was sliding.      Diffuse and patchy mild inflammation characterized by congestion       (edema), erythema and friability was found in the gastric body and in       the gastric antrum. Biopsies were taken with a cold forceps for       histology.      The cardia and gastric fundus were normal on retroflexion otherwise.      The examined duodenum was normal.      Abnormal motility was noted in the middle third of the esophagus and in       the lower third of the esophagus. The cricopharyngeus was normal. There       is a decrease in motility of the esophageal body. Tertiary peristaltic       waves are noted. Impression:           - Intermittant Schatzki ring. Dilated.                       - LA Grade B erosive esophagitis. Biopsied.                       - Monilial esophagitis. Cells for cytology obtained.                       - Small hiatal hernia.                       - Atrophic bile gastritis. Biopsied.                       - Normal examined duodenum. Recommendation:       - Discharge patient to home.                       -Diflucan (fluconazole) 200 mg day one, then100 mg PO                        daily for 4 days.                       - Use Aciphex (rabeprazole) 20 mg PO daily daily.                       -consider carafate 1 gm po 2-4 times daily on return to                        clinic Procedure Code(s):    --- Professional ---                       [phone], Esophagogastroduodenoscopy, flexible, transoral;                        with transendoscopic balloon dilation of esophagus                        (less than 30 mm diameter)                       [id number], Esophagogastroduodenoscopy, flexible, transoral;                        with biopsy,  single or multiple Diagnosis Code(s):    --- Professional ---                       K22.2, Esophageal  obstruction                       K20.8, Other esophagitis                       B37.81, Candidal esophagitis                       K44.9, Diaphragmatic hernia without obstruction or                        gangrene                       K29.40, Chronic atrophic gastritis without bleeding                       K29.60, Other gastritis without bleeding                       R13.10, Dysphagia, unspecified CPT copyright 2016 American Medical Association. All rights reserved. The codes documented in this report are preliminary and upon coder review may  be revised to meet current compliance requirements. Christena Deem, MD 02/11/2017 12:26:27 PM This report has been signed electronically. Number of Addenda: 0 Note Initiated On: 02/11/2017 11:40 AM      [facility]

## 2017-02-11 NOTE — Anesthesia Procedure Notes (Signed)
Date/Time: 02/11/2017 11:58 AM Performed by: Junious SilkNOLES,  Pre-anesthesia Checklist: Patient identified, Emergency Drugs available, Suction available, Patient being monitored and Timeout performed Oxygen Delivery Method: Nasal cannula

## 2017-02-12 LAB — SURGICAL PATHOLOGY

## 2017-02-14 ENCOUNTER — Encounter: Payer: Self-pay | Admitting: Gastroenterology

## 2017-02-20 ENCOUNTER — Ambulatory Visit (INDEPENDENT_AMBULATORY_CARE_PROVIDER_SITE_OTHER): Payer: Medicare Other

## 2017-02-20 ENCOUNTER — Other Ambulatory Visit (INDEPENDENT_AMBULATORY_CARE_PROVIDER_SITE_OTHER): Payer: Medicare Other

## 2017-02-20 DIAGNOSIS — I480 Paroxysmal atrial fibrillation: Secondary | ICD-10-CM

## 2017-02-20 DIAGNOSIS — I4891 Unspecified atrial fibrillation: Secondary | ICD-10-CM | POA: Diagnosis not present

## 2017-02-20 DIAGNOSIS — Z79899 Other long term (current) drug therapy: Secondary | ICD-10-CM

## 2017-02-20 DIAGNOSIS — I2510 Atherosclerotic heart disease of native coronary artery without angina pectoris: Secondary | ICD-10-CM

## 2017-02-20 DIAGNOSIS — Z5181 Encounter for therapeutic drug level monitoring: Secondary | ICD-10-CM

## 2017-02-20 LAB — POCT INR: INR: 2.1

## 2017-02-21 LAB — CBC WITH DIFFERENTIAL/PLATELET
BASOS ABS: 0 10*3/uL (ref 0.0–0.2)
BASOS: 1 %
EOS (ABSOLUTE): 0.5 10*3/uL — AB (ref 0.0–0.4)
Eos: 8 %
HEMOGLOBIN: 16.5 g/dL (ref 13.0–17.7)
Hematocrit: 47.7 % (ref 37.5–51.0)
IMMATURE GRANS (ABS): 0 10*3/uL (ref 0.0–0.1)
Immature Granulocytes: 0 %
LYMPHS ABS: 1.5 10*3/uL (ref 0.7–3.1)
LYMPHS: 24 %
MCH: 34 pg — AB (ref 26.6–33.0)
MCHC: 34.6 g/dL (ref 31.5–35.7)
MCV: 98 fL — AB (ref 79–97)
Monocytes Absolute: 0.6 10*3/uL (ref 0.1–0.9)
Monocytes: 10 %
NEUTROS ABS: 3.6 10*3/uL (ref 1.4–7.0)
Neutrophils: 57 %
PLATELETS: 171 10*3/uL (ref 150–379)
RBC: 4.86 x10E6/uL (ref 4.14–5.80)
RDW: 13.6 % (ref 12.3–15.4)
WBC: 6.2 10*3/uL (ref 3.4–10.8)

## 2017-02-21 LAB — LIPID PANEL
CHOL/HDL RATIO: 4.3 ratio (ref 0.0–5.0)
CHOLESTEROL TOTAL: 136 mg/dL (ref 100–199)
HDL: 32 mg/dL — AB (ref >39)
LDL CALC: 70 mg/dL (ref 0–99)
TRIGLYCERIDES: 172 mg/dL — AB (ref 0–149)
VLDL Cholesterol Cal: 34 mg/dL (ref 5–40)

## 2017-02-21 LAB — COMPREHENSIVE METABOLIC PANEL
ALT: 12 IU/L (ref 0–44)
AST: 16 IU/L (ref 0–40)
Albumin/Globulin Ratio: 2.1 (ref 1.2–2.2)
Albumin: 4.7 g/dL (ref 3.5–4.8)
Alkaline Phosphatase: 70 IU/L (ref 39–117)
BUN/Creatinine Ratio: 13 (ref 10–24)
BUN: 15 mg/dL (ref 8–27)
Bilirubin Total: 0.6 mg/dL (ref 0.0–1.2)
CALCIUM: 9.6 mg/dL (ref 8.6–10.2)
CO2: 24 mmol/L (ref 18–29)
CREATININE: 1.18 mg/dL (ref 0.76–1.27)
Chloride: 103 mmol/L (ref 96–106)
GFR, EST AFRICAN AMERICAN: 67 mL/min/{1.73_m2} (ref >59)
GFR, EST NON AFRICAN AMERICAN: 58 mL/min/{1.73_m2} — AB (ref >59)
GLUCOSE: 109 mg/dL — AB (ref 65–99)
Globulin, Total: 2.2 g/dL (ref 1.5–4.5)
POTASSIUM: 4.1 mmol/L (ref 3.5–5.2)
Sodium: 144 mmol/L (ref 134–144)
TOTAL PROTEIN: 6.9 g/dL (ref 6.0–8.5)

## 2017-02-21 LAB — TSH: TSH: 11.28 u[IU]/mL — ABNORMAL HIGH (ref 0.450–4.500)

## 2017-02-22 ENCOUNTER — Other Ambulatory Visit: Payer: Self-pay | Admitting: Internal Medicine

## 2017-03-14 ENCOUNTER — Ambulatory Visit (INDEPENDENT_AMBULATORY_CARE_PROVIDER_SITE_OTHER): Payer: Medicare Other

## 2017-03-14 ENCOUNTER — Other Ambulatory Visit: Payer: Self-pay

## 2017-03-14 ENCOUNTER — Telehealth: Payer: Self-pay | Admitting: Cardiovascular Disease

## 2017-03-14 DIAGNOSIS — I495 Sick sinus syndrome: Secondary | ICD-10-CM

## 2017-03-14 DIAGNOSIS — I480 Paroxysmal atrial fibrillation: Secondary | ICD-10-CM

## 2017-03-14 NOTE — Telephone Encounter (Signed)
Left message for pt to call back to sched appt to see Dr. Mariah MillingGollan to discuss possible med change, as we have not seen him since October last year.

## 2017-03-14 NOTE — Telephone Encounter (Signed)
Patient was taken off of Amiodarone recently by Dr. Graciela HusbandsKlein and patient spouse wants to make sure Dr. Mariah MillingGollan agrees with this .   Dr. Graciela HusbandsKlein also wanted to discuss switching from Coumadin to another medication .    Should patient schedule early fu to discuss these issues? Please call .

## 2017-03-20 ENCOUNTER — Ambulatory Visit (INDEPENDENT_AMBULATORY_CARE_PROVIDER_SITE_OTHER): Payer: Medicare Other

## 2017-03-20 DIAGNOSIS — I4891 Unspecified atrial fibrillation: Secondary | ICD-10-CM

## 2017-03-20 DIAGNOSIS — Z5181 Encounter for therapeutic drug level monitoring: Secondary | ICD-10-CM

## 2017-03-20 DIAGNOSIS — I480 Paroxysmal atrial fibrillation: Secondary | ICD-10-CM

## 2017-03-20 DIAGNOSIS — I2510 Atherosclerotic heart disease of native coronary artery without angina pectoris: Secondary | ICD-10-CM

## 2017-03-20 LAB — POCT INR: INR: 1.8

## 2017-03-23 NOTE — Progress Notes (Signed)
Cardiology Office Note  Date:  03/26/2017   ID:  Kyle Morales, DOB [birth year], MRN [id number]  PCP:  Kyle Blazing, MD   Chief Complaint  Patient presents with  . Other    C/o hand and feet numbness, Pt would like to discuss echo results and possible medication changes. Meds reviewed verbally with pt.    HPI:  Kyle Morales is a  81 year old gentleman history of  paroxysmal atrial fibrillation dating back to 1992   coronary artery disease with stent placement at [facility] in 1997,  pacemaker placement in [place] in 2007 for diagnosis of heart block nonsustained VT,  chronic diarrhea Pacemaker change out done at [facility],  short run of nonsustained VT, 9 seconds Who presents for followup of his atrial fibrillation and coronary artery disease.  In follow-up today, he would like to discuss changing over to eliquis Discussed risk and benefit with him in detail He does have bruising on his forearms from warfarin Recommendation for the change was made by Kyle Morales Recent INR 1.8  He denies any significant chest pain, leg swelling, shortness of breath on exertion Not doing his regular exercise program  Wife scheduled to have hip surgery August 2018  Continues to take Lasix 80 mg in the morning Wears compression hose  Lab work reviewed in detail Total cholesterol 136, LDL 70 Creatinine 1.18 in May 2018 TSH 11.2 TSH in 11/2015 14.5 TSH 06/01/16 14 Not on thyroid medication Scheduled to see primary care  Chronic low blood pressure  EKG personally reviewed by myself on todays visit showing atrial paced rhythm rate 74 bpm  RBBB.    other past medical history  seen by Dr. Achilles Morales and had cystoscopy, started on tamsulosin presumably for BPH Also seen by EP at [facility]. He was told that he is not having frequent episodes of atrial fibrillation Blood pressure has been stable, minimal orthostasis. Previously held losartan for low blood pressure  Previous history of atrial fibrillation in the  setting of GI distress and diarrhea   cultures were negative. Managed by GI   seen in the emergency room July 15 2014  for cough He was diagnosed with upper respiratory infection.   He went to the ED on 06/14/14 for his chest pain, found to have atrial fibrillation, HR of 148, C diff negative.  started on amiodarone 400 mg bid x 1 week-->200 mg bid-->200 mg daily in an effort to cardiovert him. He converted to NSR for a brief period his first night in the hospital but went back into a-fib and remained in rate controlled a-fib (80s). He was adequately anticoagulated on warfarin at baseline. He converted to normal sinus rhythm in office follow-up   He had episode of atrial fibrillation August 2014. No further episodes since that time.  He is not doing a regular exercise program, has found his balance is poor.  EKG on today's visit shows normal sinus rhythm with rate 73 bpm, right bundle branch block  history of nonsustained VT. Metoprolol dose was increased from 25-100 mg daily.  Last stress test was 3-4 years ago.  In May 2014 he was traveling, could not take his medications on regular basis and developed significant lower extremity edema. 03/04/2013 he presented to the emergency room atrial fibrillation, palpitations. He was given IV fluids and he believes that this converted him back to normal sinus rhythm. His biggest concern is the CT scan showing right lower lobe pneumonia versus mass lesion. He was given several days of  antibiotics and instructed to have followup imaging for this finding in the lower lobe.  Notes from [facility] indicate stent placed to his ramus Stress test June 2009 with a normal study, ejection fraction 65%   PMH:   has a past medical history of CHF (congestive heart failure) (HCC); Coronary artery disease; Eczema; Hyperlipidemia; Hypertension; Osteoarthrosis; Paroxysmal A-fib (HCC); Presence of permanent cardiac pacemaker; and S/P cardiac pacemaker  procedure.  PSH:    Past Surgical History:  Procedure Laterality Date  . CARDIAC CATHETERIZATION  1997  . CARDIAC CATHETERIZATION  2000  . COLONOSCOPY  2012  . CORONARY ANGIOPLASTY  1997   ramus  . ESOPHAGOGASTRODUODENOSCOPY N/A 02/11/2017   Procedure: ESOPHAGOGASTRODUODENOSCOPY (EGD);  Surgeon: Kyle Deem, MD;  Location: [facility] ENDOSCOPY;  Service: Endoscopy;  Laterality: N/A;  . HERNIA REPAIR    . INSERT / REPLACE / REMOVE PACEMAKER  12/2005   Medtronic [id number] H - placed in [place]  . MANDIBLE SURGERY    . TONSILLECTOMY    . TOTAL SHOULDER REPLACEMENT     bilateral     Current Outpatient Prescriptions  Medication Sig Dispense Refill  . acetaminophen (TYLENOL) 500 MG tablet Take 500 mg by mouth every 8 (eight) hours as needed.    . CVS ZINC 50 MG TABS Take 50 mg by mouth daily.    Marland Kitchen dicyclomine (BENTYL) 10 MG capsule Take 10 mg by mouth 2 (two) times daily.    . fexofenadine (ALLEGRA) 180 MG tablet Take 180 mg by mouth daily.    . fluticasone (FLONASE) 50 MCG/ACT nasal spray Place 2 sprays into both nostrils 2 (two) times daily.    . furosemide (LASIX) 40 MG tablet Take 80 mg by mouth daily.    Marland Kitchen ipratropium (ATROVENT HFA) 17 MCG/ACT inhaler Inhale 2 puffs into the lungs 4 (four) times daily.    Marland Kitchen lovastatin (MEVACOR) 20 MG tablet Take 1 tablet (20 mg total) by mouth at bedtime. 90 tablet 3  . metoprolol succinate (TOPROL-XL) 50 MG 24 hr tablet Take two tablets in the am and one tablet in the pm.    . nitroGLYCERIN (NITROSTAT) 0.4 MG SL tablet Place 1 tablet (0.4 mg total) under the tongue every 5 (five) minutes as needed for chest pain. 25 tablet 1  . potassium chloride (K-DUR) 10 MEQ tablet Take 20 mEq by mouth daily.     Marland Kitchen spironolactone (ALDACTONE) 25 MG tablet Take 25 mg by mouth daily.     . tamsulosin (FLOMAX) 0.4 MG CAPS capsule Take 0.4 mg by mouth daily after breakfast.    . warfarin (COUMADIN) 1 MG tablet Take 2-3 tablets (2-3 mg total) by mouth daily. 180  tablet 1   No current facility-administered medications for this visit.      Allergies:   Amoxicillin   Social History:  The patient  reports that he has never smoked. He has never used smokeless tobacco. He reports that he does not drink alcohol or use drugs.   Family History:   family history includes Heart attack in his father; Heart disease in his brother and mother; Hyperlipidemia in his mother; Hypertension in his mother.    Review of Systems: Review of Systems  Constitutional: Negative.   Respiratory: Negative.   Cardiovascular: Negative.   Gastrointestinal: Negative.   Musculoskeletal: Negative.        Leg weakness  Neurological: Negative.   Psychiatric/Behavioral: Negative.   All other systems reviewed and are negative.    PHYSICAL EXAM: VS:  BP 102/70 (  BP Location: Left Arm, Patient Position: Sitting, Cuff Size: Normal)   Pulse 74   Ht 6' (1.829 m)   Wt 178 lb 4 oz (80.9 kg)   BMI 24.18 kg/m  , BMI Body mass index is 24.18 kg/m. GEN: Well nourished, well developed, in no acute distress  HEENT: normal  Neck: no JVD, carotid bruits, or masses Cardiac: RRR; no murmurs, rubs, or gallops,no edema  Respiratory:  clear to auscultation bilaterally, normal work of breathing GI: soft, nontender, nondistended, + BS MS: no deformity or atrophy  Skin: warm and dry, no rash Neuro:  Strength and sensation are intact Psych: euthymic mood, full affect    Recent Labs: 02/20/2017: ALT 12; BUN 15; Creatinine, Ser 1.18; Hemoglobin 16.5; Platelets 171; Potassium 4.1; Sodium 144; TSH 11.280    Lipid Panel Lab Results  Component Value Date   CHOL 136 02/20/2017   HDL 32 (L) 02/20/2017   LDLCALC 70 02/20/2017   TRIG 172 (H) 02/20/2017      Wt Readings from Last 3 Encounters:  03/26/17 178 lb 4 oz (80.9 kg)  02/11/17 170 lb (77.1 kg)  02/05/17 176 lb 8 oz (80.1 kg)       ASSESSMENT AND PLAN:   Atrial fibrillation, unspecified type (HCC) - Plan: EKG  12-Lead Minimal atrial fibrillation per notes from EP Recently seen by Dr. Graciela HusbandsKlein Recommendation made to change to eliquis  5 mg twice a day Long discussion with him concerning risk and benefit  We will hold the warfarin , eliquis has been sent in through mail  we will arrange anticoagulation clinic appointment next Wednesday   amiodarone has been held with mild improvement in TSH  Pure hypercholesterolemia Encouraged him to stay on his lovastatin Goal LDL less than 70  Atherosclerosis of native coronary artery of native heart without angina pectoris Currently with no symptoms of angina. No further workup at this time. Continue current medication regimen.  Orthostatic hypotension Denies any dizziness, only leg weakness He is on high dose Lasix,  Stable BMP  Also on prostate medication Will monitor for now   Hypothyroidism Previous TSH has been stable at 14   now TSH 11 off amiodarone    Total encounter time more than 25 minutes  Greater than 50% was spent in counseling and coordination of care with the patient   Disposition:   F/U  6 months   Orders Placed This Encounter  Procedures  . EKG 12-Lead     Signed, Dossie Arbourim , M.D., Ph.D. 03/26/2017  [facility] [place] [phone]

## 2017-03-26 ENCOUNTER — Ambulatory Visit (INDEPENDENT_AMBULATORY_CARE_PROVIDER_SITE_OTHER): Payer: Medicare Other | Admitting: Cardiovascular Disease

## 2017-03-26 ENCOUNTER — Encounter: Payer: Self-pay | Admitting: Cardiovascular Disease

## 2017-03-26 VITALS — BP 102/70 | HR 74 | Ht 72.0 in | Wt 178.2 lb

## 2017-03-26 DIAGNOSIS — I480 Paroxysmal atrial fibrillation: Secondary | ICD-10-CM

## 2017-03-26 DIAGNOSIS — R0602 Shortness of breath: Secondary | ICD-10-CM

## 2017-03-26 DIAGNOSIS — E782 Mixed hyperlipidemia: Secondary | ICD-10-CM

## 2017-03-26 DIAGNOSIS — I209 Angina pectoris, unspecified: Secondary | ICD-10-CM

## 2017-03-26 DIAGNOSIS — Z950 Presence of cardiac pacemaker: Secondary | ICD-10-CM

## 2017-03-26 DIAGNOSIS — I2510 Atherosclerotic heart disease of native coronary artery without angina pectoris: Secondary | ICD-10-CM

## 2017-03-26 DIAGNOSIS — I951 Orthostatic hypotension: Secondary | ICD-10-CM

## 2017-03-26 DIAGNOSIS — I25118 Atherosclerotic heart disease of native coronary artery with other forms of angina pectoris: Secondary | ICD-10-CM | POA: Diagnosis not present

## 2017-03-26 DIAGNOSIS — I509 Heart failure, unspecified: Secondary | ICD-10-CM

## 2017-03-26 DIAGNOSIS — I5032 Chronic diastolic (congestive) heart failure: Secondary | ICD-10-CM | POA: Diagnosis not present

## 2017-03-26 DIAGNOSIS — I10 Essential (primary) hypertension: Secondary | ICD-10-CM

## 2017-03-26 MED ORDER — APIXABAN 5 MG PO TABS: 5.0000 mg | ORAL_TABLET | Freq: Two times a day (BID) | ORAL | 3 refills | Status: DC

## 2017-03-26 NOTE — Patient Instructions (Addendum)
Medication Instructions:   We have ordered the eliquis Please call when this comes in the mail TWICE a DAY medication (Am/PM)  Labwork:  No new labs needed  Testing/Procedures:  No further testing at this time   Follow-Up: It was a pleasure seeing you in the office today. Please call us if you have new issues that need to be addressed before your next appt.  [phone]  Your physician wants you to follow-up in: 6 months.  You will receive a reminder letter in the mail two months in advance. If you don't receive a letter, please call our office to schedule the follow-up appointment.  If you need a refill on your cardiac medications before your next appointment, please call your pharmacy.

## 2017-04-10 ENCOUNTER — Ambulatory Visit (INDEPENDENT_AMBULATORY_CARE_PROVIDER_SITE_OTHER): Payer: Medicare Other | Admitting: *Deleted

## 2017-04-10 DIAGNOSIS — Z5181 Encounter for therapeutic drug level monitoring: Secondary | ICD-10-CM | POA: Diagnosis not present

## 2017-04-10 DIAGNOSIS — I2510 Atherosclerotic heart disease of native coronary artery without angina pectoris: Secondary | ICD-10-CM

## 2017-04-10 DIAGNOSIS — I480 Paroxysmal atrial fibrillation: Secondary | ICD-10-CM

## 2017-04-10 DIAGNOSIS — I4891 Unspecified atrial fibrillation: Secondary | ICD-10-CM

## 2017-04-10 LAB — POCT INR: INR: 1.7

## 2017-04-19 ENCOUNTER — Other Ambulatory Visit: Payer: Self-pay

## 2017-04-19 ENCOUNTER — Telehealth: Payer: Self-pay | Admitting: Cardiovascular Disease

## 2017-04-19 MED ORDER — METOPROLOL SUCCINATE ER 50 MG PO TB24: 50.0000 mg | ORAL_TABLET | ORAL | 0 refills | Status: DC

## 2017-04-19 MED ORDER — METOPROLOL SUCCINATE ER 50 MG PO TB24: 50.0000 mg | ORAL_TABLET | ORAL | 3 refills | Status: DC

## 2017-04-19 NOTE — Telephone Encounter (Signed)
LMOM to have patient call back regarding his dose of metoprolol and who is the prescriber on the bottle.

## 2017-04-19 NOTE — Telephone Encounter (Signed)
Patients wife was calling back for refills. PCP has left the practice and they are out of medication and need refills. 30 day prescription sent in to local pharmacy and then 90 day prescription sent in to [facility] per her request. She was so very appreciative and had no further needs or questions at this time.

## 2017-04-19 NOTE — Telephone Encounter (Signed)
[facility] Please call

## 2017-04-26 MED ORDER — METOPROLOL SUCCINATE ER 50 MG PO TB24: ORAL_TABLET | ORAL | 3 refills | Status: DC

## 2017-05-02 ENCOUNTER — Telehealth: Payer: Self-pay

## 2017-05-02 ENCOUNTER — Telehealth: Payer: Self-pay | Admitting: Internal Medicine

## 2017-05-02 NOTE — Telephone Encounter (Signed)
Spoke with patient's wife and explained that patient could send his remote transmission the night before it was due for processing the next day. She verbalized understanding and is agreeable to this plan.

## 2017-05-02 NOTE — Telephone Encounter (Signed)
Pt spouse calling stating on Tuesday patient is to have a remote check  But she and him are going to be at the hospital all day She is having hip surgery  Would like advise on this since they are not able to do it that day

## 2017-05-07 ENCOUNTER — Ambulatory Visit (INDEPENDENT_AMBULATORY_CARE_PROVIDER_SITE_OTHER): Payer: Medicare Other | Admitting: *Deleted

## 2017-05-07 DIAGNOSIS — I480 Paroxysmal atrial fibrillation: Secondary | ICD-10-CM

## 2017-05-07 DIAGNOSIS — I495 Sick sinus syndrome: Secondary | ICD-10-CM

## 2017-05-08 LAB — CUP PACEART REMOTE DEVICE CHECK
Battery Remaining Longevity: 98 mo
Battery Voltage: 3.01 V
Brady Statistic RV Percent Paced: 0.1 %
Date Time Interrogation Session: 20180813225335
Implantable Lead Implant Date: 20070411
Implantable Lead Implant Date: 20070411
Implantable Lead Location: 753859
Implantable Lead Model: 5076
Implantable Pulse Generator Implant Date: 20170224
Lead Channel Impedance Value: 399 Ohm
Lead Channel Pacing Threshold Amplitude: 0.625 V
Lead Channel Sensing Intrinsic Amplitude: 2.625 mV
Lead Channel Sensing Intrinsic Amplitude: 5.5 mV
Lead Channel Setting Pacing Amplitude: 2.5 V
Lead Channel Setting Sensing Sensitivity: 0.9 mV
MDC IDC LEAD LOCATION: 753860
MDC IDC MSMT LEADCHNL RA IMPEDANCE VALUE: 361 Ohm
MDC IDC MSMT LEADCHNL RA IMPEDANCE VALUE: 437 Ohm
MDC IDC MSMT LEADCHNL RA PACING THRESHOLD PULSEWIDTH: 0.4 ms
MDC IDC MSMT LEADCHNL RA SENSING INTR AMPL: 2.625 mV
MDC IDC MSMT LEADCHNL RV IMPEDANCE VALUE: 361 Ohm
MDC IDC MSMT LEADCHNL RV PACING THRESHOLD AMPLITUDE: 1 V
MDC IDC MSMT LEADCHNL RV PACING THRESHOLD PULSEWIDTH: 0.4 ms
MDC IDC MSMT LEADCHNL RV SENSING INTR AMPL: 5.5 mV
MDC IDC SET LEADCHNL RA PACING AMPLITUDE: 2 V
MDC IDC SET LEADCHNL RV PACING PULSEWIDTH: 0.4 ms
MDC IDC STAT BRADY AP VP PERCENT: 0.08 %
MDC IDC STAT BRADY AP VS PERCENT: 99.65 %
MDC IDC STAT BRADY AS VP PERCENT: 0 %
MDC IDC STAT BRADY AS VS PERCENT: 0.27 %
MDC IDC STAT BRADY RA PERCENT PACED: 99.39 %

## 2017-05-08 NOTE — Progress Notes (Signed)
Remote pacemaker check. 

## 2017-05-16 ENCOUNTER — Other Ambulatory Visit: Payer: Self-pay

## 2017-05-16 DIAGNOSIS — I4891 Unspecified atrial fibrillation: Secondary | ICD-10-CM

## 2017-05-16 NOTE — Progress Notes (Signed)
c 

## 2017-05-21 ENCOUNTER — Encounter: Payer: Self-pay | Admitting: Cardiology

## 2017-05-24 ENCOUNTER — Other Ambulatory Visit (INDEPENDENT_AMBULATORY_CARE_PROVIDER_SITE_OTHER): Payer: Medicare Other

## 2017-05-24 DIAGNOSIS — I4891 Unspecified atrial fibrillation: Secondary | ICD-10-CM

## 2017-05-25 LAB — BASIC METABOLIC PANEL
BUN / CREAT RATIO: 14 (ref 10–24)
BUN: 15 mg/dL (ref 8–27)
CALCIUM: 9.2 mg/dL (ref 8.6–10.2)
CHLORIDE: 103 mmol/L (ref 96–106)
CO2: 22 mmol/L (ref 20–29)
Creatinine, Ser: 1.11 mg/dL (ref 0.76–1.27)
GFR calc non Af Amer: 63 mL/min/{1.73_m2} (ref >59)
GFR, EST AFRICAN AMERICAN: 73 mL/min/{1.73_m2} (ref >59)
GLUCOSE: 101 mg/dL — AB (ref 65–99)
POTASSIUM: 4.2 mmol/L (ref 3.5–5.2)
Sodium: 141 mmol/L (ref 134–144)

## 2017-05-25 LAB — CBC WITH DIFFERENTIAL/PLATELET
BASOS ABS: 0 10*3/uL (ref 0.0–0.2)
BASOS: 1 %
EOS (ABSOLUTE): 0.3 10*3/uL (ref 0.0–0.4)
Eos: 6 %
Hematocrit: 43.3 % (ref 37.5–51.0)
Hemoglobin: 14.9 g/dL (ref 13.0–17.7)
IMMATURE GRANULOCYTES: 0 %
Immature Grans (Abs): 0 10*3/uL (ref 0.0–0.1)
Lymphocytes Absolute: 1.2 10*3/uL (ref 0.7–3.1)
Lymphs: 25 %
MCH: 33.6 pg — ABNORMAL HIGH (ref 26.6–33.0)
MCHC: 34.4 g/dL (ref 31.5–35.7)
MCV: 98 fL — AB (ref 79–97)
MONOS ABS: 0.5 10*3/uL (ref 0.1–0.9)
Monocytes: 10 %
NEUTROS PCT: 58 %
Neutrophils Absolute: 3 10*3/uL (ref 1.4–7.0)
PLATELETS: 139 10*3/uL — AB (ref 150–379)
RBC: 4.44 x10E6/uL (ref 4.14–5.80)
RDW: 13.4 % (ref 12.3–15.4)
WBC: 5.1 10*3/uL (ref 3.4–10.8)

## 2017-06-04 ENCOUNTER — Encounter: Payer: Self-pay | Admitting: Cardiology

## 2017-08-06 ENCOUNTER — Ambulatory Visit (INDEPENDENT_AMBULATORY_CARE_PROVIDER_SITE_OTHER): Payer: Medicare Other | Admitting: *Deleted

## 2017-08-06 DIAGNOSIS — I495 Sick sinus syndrome: Secondary | ICD-10-CM

## 2017-08-06 NOTE — Progress Notes (Signed)
Remote pacemaker transmission.   

## 2017-08-07 LAB — CUP PACEART REMOTE DEVICE CHECK
Battery Voltage: 3.01 V
Brady Statistic AP VP Percent: 0.06 %
Brady Statistic AS VP Percent: 0 %
Brady Statistic RA Percent Paced: 98.81 %
Date Time Interrogation Session: 20181113155048
Implantable Lead Implant Date: 20070411
Implantable Lead Location: 753859
Implantable Lead Model: 5076
Implantable Pulse Generator Implant Date: 20170224
Lead Channel Impedance Value: 342 Ohm
Lead Channel Impedance Value: 361 Ohm
Lead Channel Pacing Threshold Amplitude: 0.625 V
Lead Channel Pacing Threshold Pulse Width: 0.4 ms
Lead Channel Pacing Threshold Pulse Width: 0.4 ms
Lead Channel Sensing Intrinsic Amplitude: 5.375 mV
Lead Channel Setting Pacing Amplitude: 2 V
Lead Channel Setting Pacing Pulse Width: 0.4 ms
MDC IDC LEAD IMPLANT DT: 20070411
MDC IDC LEAD LOCATION: 753860
MDC IDC MSMT BATTERY REMAINING LONGEVITY: 97 mo
MDC IDC MSMT LEADCHNL RA IMPEDANCE VALUE: 437 Ohm
MDC IDC MSMT LEADCHNL RA SENSING INTR AMPL: 2 mV
MDC IDC MSMT LEADCHNL RA SENSING INTR AMPL: 2 mV
MDC IDC MSMT LEADCHNL RV IMPEDANCE VALUE: 399 Ohm
MDC IDC MSMT LEADCHNL RV PACING THRESHOLD AMPLITUDE: 1 V
MDC IDC MSMT LEADCHNL RV SENSING INTR AMPL: 5.375 mV
MDC IDC SET LEADCHNL RV PACING AMPLITUDE: 2.5 V
MDC IDC SET LEADCHNL RV SENSING SENSITIVITY: 0.9 mV
MDC IDC STAT BRADY AP VS PERCENT: 99.08 %
MDC IDC STAT BRADY AS VS PERCENT: 0.85 %
MDC IDC STAT BRADY RV PERCENT PACED: 0.08 %

## 2017-08-09 ENCOUNTER — Encounter: Payer: Self-pay | Admitting: Cardiology

## 2017-08-13 ENCOUNTER — Encounter: Payer: Self-pay | Admitting: Internal Medicine

## 2017-08-13 ENCOUNTER — Ambulatory Visit (INDEPENDENT_AMBULATORY_CARE_PROVIDER_SITE_OTHER): Payer: Medicare Other | Admitting: Internal Medicine

## 2017-08-13 VITALS — BP 110/70 | HR 76 | Ht 70.5 in | Wt 178.2 lb

## 2017-08-13 DIAGNOSIS — I482 Chronic atrial fibrillation, unspecified: Secondary | ICD-10-CM

## 2017-08-13 DIAGNOSIS — Z950 Presence of cardiac pacemaker: Secondary | ICD-10-CM | POA: Diagnosis not present

## 2017-08-13 DIAGNOSIS — I2510 Atherosclerotic heart disease of native coronary artery without angina pectoris: Secondary | ICD-10-CM

## 2017-08-13 DIAGNOSIS — I480 Paroxysmal atrial fibrillation: Secondary | ICD-10-CM

## 2017-08-13 LAB — CUP PACEART INCLINIC DEVICE CHECK
Battery Remaining Longevity: 97 mo
Battery Voltage: 3.01 V
Brady Statistic RA Percent Paced: 99.09 %
Brady Statistic RV Percent Paced: 0.09 %
Date Time Interrogation Session: 20181120154424
Implantable Lead Implant Date: 20070411
Implantable Lead Location: 753859
Implantable Pulse Generator Implant Date: 20170224
Lead Channel Impedance Value: 342 Ohm
Lead Channel Impedance Value: 437 Ohm
Lead Channel Pacing Threshold Amplitude: 0.75 V
Lead Channel Pacing Threshold Pulse Width: 0.4 ms
Lead Channel Sensing Intrinsic Amplitude: 2.625 mV
Lead Channel Setting Pacing Amplitude: 2.5 V
MDC IDC LEAD IMPLANT DT: 20070411
MDC IDC LEAD LOCATION: 753860
MDC IDC MSMT LEADCHNL RA PACING THRESHOLD PULSEWIDTH: 0.4 ms
MDC IDC MSMT LEADCHNL RV IMPEDANCE VALUE: 342 Ohm
MDC IDC MSMT LEADCHNL RV IMPEDANCE VALUE: 399 Ohm
MDC IDC MSMT LEADCHNL RV PACING THRESHOLD AMPLITUDE: 1 V
MDC IDC MSMT LEADCHNL RV SENSING INTR AMPL: 5.5 mV
MDC IDC SET LEADCHNL RA PACING AMPLITUDE: 2 V
MDC IDC SET LEADCHNL RV PACING PULSEWIDTH: 0.4 ms
MDC IDC SET LEADCHNL RV SENSING SENSITIVITY: 0.9 mV
MDC IDC STAT BRADY AP VP PERCENT: 0.07 %
MDC IDC STAT BRADY AP VS PERCENT: 99.37 %
MDC IDC STAT BRADY AS VP PERCENT: 0 %
MDC IDC STAT BRADY AS VS PERCENT: 0.56 %

## 2017-08-13 NOTE — Progress Notes (Signed)
Patient Care Team: Katrinka BlazingHalpert, Karen D, MD as PCP - Nelle DonGeneral Newton, Warren, MD as PCP - Family Medicine Mariah MillingGollan, Tollie Pizzaimothy J, MD as Consulting Physician (Cardiology)   HPI  Kyle Morales is a 81 y.o. male Seen in  follow-up for pacemaker implanted for sinus node dysfunction remotely w gen change 2/17  Also atrial fibrillation dating at least back to 2014  for which he been treated with amiodarone and warfarin   DATE TEST    6/14 Myoview   EF 65 %   6/18 Echo 50         Date Cr K  8/18  1.11 4.2         Some orthostatic dizziness  Doing better on apixoban with less lower ext discoloration  Denies chest pain sob orthopnea PND     Past Medical History:  Diagnosis Date  . CHF (congestive heart failure) (HCC)   . Coronary artery disease   . Eczema   . Hyperlipidemia   . Hypertension   . Osteoarthrosis   . Paroxysmal A-fib (HCC)   . Presence of permanent cardiac pacemaker   . S/P cardiac pacemaker procedure     Past Surgical History:  Procedure Laterality Date  . CARDIAC CATHETERIZATION  1997  . CARDIAC CATHETERIZATION  2000  . COLONOSCOPY  2012  . CORONARY ANGIOPLASTY  1997   ramus  . ESOPHAGOGASTRODUODENOSCOPY (EGD) N/A 02/11/2017   Performed by Christena DeemSkulskie, Martin U[facility]  . HERNIA REPAIR    . INSERT / REPLACE / REMOVE PACEMAKER  12/2005   Medtronic [id number] H - [place] in [place]  . MANDIBLE SURGERY    . TONSILLECTOMY    . TOTAL SHOULDER REPLACEMENT     bilateral     Current Outpatient Medications  Medication Sig Dispense Refill  . acetaminophen (TYLENOL) 500 MG tablet Take 500 mg by mouth every 8 (eight) hours as needed.    Marland Kitchen. apixaban (ELIQUIS) 5 MG TABS tablet Take 1 tablet (5 mg total) by mouth 2 (two) times daily. 180 tablet 3  . CVS ZINC 50 MG TABS Take 50 mg by mouth daily.    Marland Kitchen. dicyclomine (BENTYL) 10 MG capsule Take 10 mg by mouth 2 (two) times daily.    . fexofenadine (ALLEGRA) 180 MG tablet Take 180 mg by mouth daily.    .  fluticasone (FLONASE) 50 MCG/ACT nasal spray Place 2 sprays into both nostrils 2 (two) times daily.    . furosemide (LASIX) 40 MG tablet Take 80 mg by mouth daily.    Marland Kitchen. lovastatin (MEVACOR) 20 MG tablet Take 1 tablet (20 mg total) by mouth at bedtime. 90 tablet 3  . metoprolol succinate (TOPROL-XL) 50 MG 24 hr tablet Take two tablets in the am and one tablet in the pm. 270 tablet 3  . nitroGLYCERIN (NITROSTAT) 0.4 MG SL tablet Place 1 tablet (0.4 mg total) under the tongue every 5 (five) minutes as needed for chest pain. 25 tablet 1  . potassium chloride (K-DUR) 10 MEQ tablet Take 20 mEq by mouth daily.     . tamsulosin (FLOMAX) 0.4 MG CAPS capsule Take 0.4 mg by mouth daily after breakfast.    . ipratropium (ATROVENT HFA) 17 MCG/ACT inhaler Inhale 2 puffs into the lungs 4 (four) times daily.     No current facility-administered medications for this visit.     Allergies  Allergen Reactions  . Amoxicillin Rash and Itching      Review of Systems  negative except from HPI and PMH  Physical Exam BP 110/70 (BP Location: Left Arm, Patient Position: Sitting, Cuff Size: Normal)   Pulse 76   Ht 5' 10.5" (1.791 m)   Wt 178 lb 4 oz (80.9 kg)   BMI 25.21 kg/m  Well developed and well nourished in no acute distress HENT normal E scleral and icterus clear Neck Supple JVP flat; carotids brisk and full Clear to ausculation  Regular rate and rhythm, no murmurs gallops or rub Soft with active bowel sounds No clubbing cyanosis Trace Edema Alert and oriented, grossly normal motor and sensory function Skin Warm and Dry  ECG Apaced 76 26/12/42 IRBBB  Assessment and  Plan Atrial fibrillation-paroxysmal  Sinus node dysfunction  Pacemaker-Medtronic  Cardiomyopathy  Orthostatic lightheadedness  VT NS  Orthostasis is worsening  BP is relatively low, so will  Stop aldactone  No history of hypokalemia  VT NS is new, but will await blood work to be checked at PCP office in 2  weeks  Euvolemic continue current meds  HR excursion adequate  Continue metop for VT NS, BP and mild cardiomyopathy          Current medicines are reviewed at length with the patient today .  The patient does have concerns regarding medicines As above

## 2017-08-13 NOTE — Patient Instructions (Signed)
Medication Instructions:  Your physician has recommended you make the following change in your medication:  STOP taking spironolactone   Labwork: none  Testing/Procedures: Remote monitoring is used to monitor your Pacemaker of ICD from home. This monitoring reduces the number of office visits required to check your device to one time per year. It allows us to keep an eye on the functioning of your device to ensure it is working properly. You are scheduled for a device check from home on February 12. You may send your transmission at any time that day. If you have a wireless device, the transmission will be sent automatically. After your physician reviews your transmission, you will receive a postcard with your next transmission date.    Follow-Up: Your physician wants you to follow-up in: 1 year with Dr. Graciela HusbandsKlein.  You will receive a reminder letter in the mail two months in advance. If you don't receive a letter, please call our office to schedule the follow-up appointment.   Any Other Special Instructions Will Be Listed Below (If Applicable).     If you need a refill on your cardiac medications before your next appointment, please call your pharmacy.

## 2017-11-05 ENCOUNTER — Ambulatory Visit (INDEPENDENT_AMBULATORY_CARE_PROVIDER_SITE_OTHER): Payer: Medicare Other | Admitting: *Deleted

## 2017-11-05 DIAGNOSIS — I495 Sick sinus syndrome: Secondary | ICD-10-CM

## 2017-11-05 NOTE — Progress Notes (Signed)
Remote pacemaker transmission.   

## 2017-11-06 ENCOUNTER — Encounter: Payer: Self-pay | Admitting: Cardiology

## 2017-11-12 NOTE — Progress Notes (Signed)
Cardiology Office Note  Date:  11/14/2017   ID:  Kyle, Morales 05/31/1936, MRN [id number]  PCP:  Katrinka Blazing, MD   Chief Complaint  Patient presents with  . OTHER    6 month f/u leg swelling pt takes 120 mg Furosemid now. Meds reviewed verbally with pt.    HPI: 6 [name] is a  82 year old gentleman history of  paroxysmal atrial fibrillation dating back to 1992   coronary artery disease with stent placement at [facility] in 1997,  pacemaker placement in [place] in 2007 for diagnosis of heart block nonsustained VT,  chronic diarrhea Pacemaker change out done at [facility],  short run of nonsustained VT, 9 seconds Who presents for followup of his atrial fibrillation and coronary artery disease.  Echocardiogram March 14, 2017 Results discussed with him in detail Ejection fraction 50-55%, mildly dilated aortic root 39 mm  Left atrium severely dilated, mild to moderate MR  Scheduled to go travel April through June, by car traveling to numerous cities at [place] Denies chest pain shortness of breath Reports his Lasix was increased up to 120 mg daily for leg swelling Was previously on Lasix 80 daily Recent lab work reviewed with him Periodically wears compression hose, not on a regular basis No regular exercise  BMP CR 0.9, BUN 18,  Potassium 3.4 Potassium dose with increase  Rare orthostasis sx  BP at home 108 -110  In a sitting position Chronically low  Total cholesterol 136, LDL 70  EKG personally reviewed by myself on todays visit showing atrial paced rhythm rate 80  Bpm  RBBB.    other past medical history  seen by Dr. Achilles Morales and had cystoscopy, started on tamsulosin presumably for BPH Also seen by EP at [facility]. He was told that he is not having frequent episodes of atrial fibrillation Blood pressure has been stable, minimal orthostasis. Previously held losartan for low blood pressure  Previous history of atrial fibrillation in the setting of GI distress and diarrhea    cultures were negative. Managed by GI   seen in the emergency room July 15 2014  for cough He was diagnosed with upper respiratory infection.   He went to the ED on 06/14/14 for his chest pain, found to have atrial fibrillation, HR of 148, C diff negative.  started on amiodarone 400 mg bid x 1 week-->200 mg bid-->200 mg daily in an effort to cardiovert him. He converted to NSR for a brief period his first night in the hospital but went back into a-fib and remained in rate controlled a-fib (80s). He was adequately anticoagulated on warfarin at baseline. He converted to normal sinus rhythm in office follow-up   He had episode of atrial fibrillation August 2014. No further episodes since that time.  He is not doing a regular exercise program, has found his balance is poor.  EKG on today's visit shows normal sinus rhythm with rate 73 bpm, right bundle branch block  history of nonsustained VT. Metoprolol dose was increased from 25-100 mg daily.  Last stress test was 3-4 years ago.  In May 2014 he was traveling, could not take his medications on regular basis and developed significant lower extremity edema. 03/04/2013 he presented to the emergency room atrial fibrillation, palpitations. He was given IV fluids and he believes that this converted him back to normal sinus rhythm. His biggest concern is the CT scan showing right lower lobe pneumonia versus mass lesion. He was given several days of antibiotics and instructed  to have followup imaging for this finding in the lower lobe.  Notes from [facility] indicate stent placed to his ramus Stress test June 2009 with a normal study, ejection fraction 65%   PMH:   has a past medical history of CHF (congestive heart failure) (HCC), Coronary artery disease, Eczema, Hyperlipidemia, Hypertension, Osteoarthrosis, Paroxysmal A-fib (HCC), Presence of permanent cardiac pacemaker, and S/P cardiac pacemaker procedure.  PSH:    Past Surgical History:   Procedure Laterality Date  . CARDIAC CATHETERIZATION  1997  . CARDIAC CATHETERIZATION  2000  . COLONOSCOPY  2012  . CORONARY ANGIOPLASTY  1997   ramus  . ESOPHAGOGASTRODUODENOSCOPY N/A 02/11/2017   Procedure: ESOPHAGOGASTRODUODENOSCOPY (EGD);  Surgeon: Kyle Deem, MD;  Location: [facility] ENDOSCOPY;  Service: Endoscopy;  Laterality: N/A;  . HERNIA REPAIR    . INSERT / REPLACE / REMOVE PACEMAKER  12/2005   Medtronic [id number] H - placed in [place]  . MANDIBLE SURGERY    . TONSILLECTOMY    . TOTAL SHOULDER REPLACEMENT     bilateral     Current Outpatient Medications  Medication Sig Dispense Refill  . acetaminophen (TYLENOL) 500 MG tablet Take 500 mg by mouth every 8 (eight) hours as needed.    Marland Kitchen apixaban (ELIQUIS) 5 MG TABS tablet Take 1 tablet (5 mg total) by mouth 2 (two) times daily. 180 tablet 3  . CVS ZINC 50 MG TABS Take 50 mg by mouth daily.    Marland Kitchen dicyclomine (BENTYL) 10 MG capsule Take 10 mg by mouth 2 (two) times daily.    . fluticasone (FLONASE) 50 MCG/ACT nasal spray Place 2 sprays into both nostrils 2 (two) times daily.    . furosemide (LASIX) 40 MG tablet Take 120 mg by mouth daily.     Marland Kitchen ipratropium (ATROVENT HFA) 17 MCG/ACT inhaler Inhale 2 puffs into the lungs 4 (four) times daily.    Marland Kitchen lovastatin (MEVACOR) 20 MG tablet Take 1 tablet (20 mg total) by mouth at bedtime. 90 tablet 3  . metoprolol succinate (TOPROL-XL) 50 MG 24 hr tablet Take two tablets in the am and one tablet in the pm. 270 tablet 3  . nitroGLYCERIN (NITROSTAT) 0.4 MG SL tablet Place 1 tablet (0.4 mg total) under the tongue every 5 (five) minutes as needed for chest pain. 25 tablet 1  . potassium chloride (K-DUR) 10 MEQ tablet Take 20 mEq by mouth daily.     . tamsulosin (FLOMAX) 0.4 MG CAPS capsule Take 0.4 mg by mouth daily after breakfast.     No current facility-administered medications for this visit.      Allergies:   Amoxicillin   Social History:  The patient  reports that  has never  smoked. he has never used smokeless tobacco. He reports that he does not drink alcohol or use drugs.   Family History:   family history includes Heart attack in his father; Heart disease in his brother and mother; Hyperlipidemia in his mother; Hypertension in his mother.    Review of Systems: Review of Systems  Constitutional: Negative.   Respiratory: Negative.   Cardiovascular: Negative.   Gastrointestinal: Negative.   Musculoskeletal: Negative.        Leg weakness  Neurological: Negative.   Psychiatric/Behavioral: Negative.   All other systems reviewed and are negative.    PHYSICAL EXAM: VS:  BP 98/70 (BP Location: Left Arm, Patient Position: Sitting, Cuff Size: Normal)   Pulse 80   Ht 6' (1.829 m)   Wt 177 lb (80.3 kg)  BMI 24.01 kg/m  , BMI Body mass index is 24.01 kg/m. Constitutional:  oriented to person, place, and time. No distress.  HENT:  Head: Normocephalic and atraumatic.  Eyes:  no discharge. No scleral icterus.  Neck: Normal range of motion. Neck supple. No JVD present.  Cardiovascular: Normal rate, regular rhythm, normal heart sounds and intact distal pulses. Exam reveals no gallop and no friction rub. No edema No murmur heard. Pulmonary/Chest: Effort normal and breath sounds normal. No stridor. No respiratory distress.  no wheezes.  no rales.  no tenderness.  Abdominal: Soft.  no distension.  no tenderness.  Musculoskeletal: Normal range of motion.  no  tenderness or deformity.  Neurological:  normal muscle tone. Coordination normal. No atrophy Skin: Skin is warm and dry. No rash noted. not diaphoretic.  Psychiatric:  normal mood and affect. behavior is normal. Thought content normal.    Recent Labs: 02/20/2017: ALT 12; TSH 11.280 05/24/2017: BUN 15; Creatinine, Ser 1.11; Hemoglobin 14.9; Platelets 139; Potassium 4.2; Sodium 141    Lipid Panel Lab Results  Component Value Date   CHOL 136 02/20/2017   HDL 32 (L) 02/20/2017   LDLCALC 70 02/20/2017    TRIG 172 (H) 02/20/2017      Wt Readings from Last 3 Encounters:  11/14/17 177 lb (80.3 kg)  08/13/17 178 lb 4 oz (80.9 kg)  03/26/17 178 lb 4 oz (80.9 kg)       ASSESSMENT AND PLAN:   Atrial fibrillation, unspecified type (HCC) - Plan: EKG 12-Lead Minimal atrial fibrillation  Most recent pacer download not available from 2 days ago Tolerating Eliquis  5 mg twice a day  amiodarone has been held with improvement in TSH Denies any tachycardia  Pure hypercholesterolemia Encouraged him to stay on his lovastatin Goal LDL less than 70 No numbers yet from 2019  Atherosclerosis of native coronary artery of native heart without angina pectoris Currently with no symptoms of angina. No further workup at this time. Continue current medication regimen.  Stable  Orthostatic hypotension Denies any dizziness, only leg weakness He is on high dose Lasix,  Stable BMP  Discussed compression hose, back brace if he has orthostasis symptoms or missing a day of Lasix  Leg swelling Suspect a component of venous insufficiency Recommend compression hose Avoid excessive diuresis  Hypothyroidism Thyroid back to normal   Total encounter time more than 25 minutes  Greater than 50% was spent in counseling and coordination of care with the patient   Disposition:   F/U  12 months   Orders Placed This Encounter  Procedures  . EKG 12-Lead     Signed, Dossie Arbourim , M.D., Ph.D. 11/14/2017  [facility], [place] [phone]

## 2017-11-14 ENCOUNTER — Encounter: Payer: Self-pay | Admitting: Cardiovascular Disease

## 2017-11-14 ENCOUNTER — Ambulatory Visit (INDEPENDENT_AMBULATORY_CARE_PROVIDER_SITE_OTHER): Payer: Medicare Other | Admitting: Cardiovascular Disease

## 2017-11-14 VITALS — BP 98/70 | HR 80 | Ht 72.0 in | Wt 177.0 lb

## 2017-11-14 DIAGNOSIS — I495 Sick sinus syndrome: Secondary | ICD-10-CM

## 2017-11-14 DIAGNOSIS — Z950 Presence of cardiac pacemaker: Secondary | ICD-10-CM | POA: Diagnosis not present

## 2017-11-14 DIAGNOSIS — I25118 Atherosclerotic heart disease of native coronary artery with other forms of angina pectoris: Secondary | ICD-10-CM | POA: Diagnosis not present

## 2017-11-14 DIAGNOSIS — I951 Orthostatic hypotension: Secondary | ICD-10-CM | POA: Diagnosis not present

## 2017-11-14 DIAGNOSIS — I5032 Chronic diastolic (congestive) heart failure: Secondary | ICD-10-CM

## 2017-11-14 DIAGNOSIS — I2510 Atherosclerotic heart disease of native coronary artery without angina pectoris: Secondary | ICD-10-CM | POA: Diagnosis not present

## 2017-11-14 DIAGNOSIS — I480 Paroxysmal atrial fibrillation: Secondary | ICD-10-CM

## 2017-11-14 NOTE — Patient Instructions (Signed)

## 2017-11-18 ENCOUNTER — Telehealth: Payer: Self-pay | Admitting: Internal Medicine

## 2017-11-18 NOTE — Telephone Encounter (Signed)
Confirmed with patients wife that his home monitor will work in [place].

## 2017-11-18 NOTE — Telephone Encounter (Signed)
Patient has a remote check for may and wants to make sure he can do this from [place] as long as he takes the machine with him    Please call to confirm process

## 2017-11-20 LAB — CUP PACEART REMOTE DEVICE CHECK
Battery Remaining Longevity: 95 mo
Battery Voltage: 3.01 V
Brady Statistic AP VP Percent: 0.09 %
Brady Statistic AP VS Percent: 98.68 %
Brady Statistic AS VS Percent: 1.23 %
Brady Statistic RV Percent Paced: 0.11 %
Date Time Interrogation Session: 20190212152600
Implantable Lead Implant Date: 20070411
Implantable Pulse Generator Implant Date: 20170224
Lead Channel Impedance Value: 342 Ohm
Lead Channel Impedance Value: 399 Ohm
Lead Channel Impedance Value: 437 Ohm
Lead Channel Pacing Threshold Amplitude: 0.625 V
Lead Channel Pacing Threshold Amplitude: 1.125 V
Lead Channel Sensing Intrinsic Amplitude: 1.125 mV
Lead Channel Sensing Intrinsic Amplitude: 1.125 mV
Lead Channel Sensing Intrinsic Amplitude: 5.5 mV
Lead Channel Setting Pacing Amplitude: 2.5 V
Lead Channel Setting Sensing Sensitivity: 0.9 mV
MDC IDC LEAD IMPLANT DT: 20070411
MDC IDC LEAD LOCATION: 753859
MDC IDC LEAD LOCATION: 753860
MDC IDC MSMT LEADCHNL RA IMPEDANCE VALUE: 342 Ohm
MDC IDC MSMT LEADCHNL RA PACING THRESHOLD PULSEWIDTH: 0.4 ms
MDC IDC MSMT LEADCHNL RV PACING THRESHOLD PULSEWIDTH: 0.4 ms
MDC IDC MSMT LEADCHNL RV SENSING INTR AMPL: 5.5 mV
MDC IDC SET LEADCHNL RA PACING AMPLITUDE: 2 V
MDC IDC SET LEADCHNL RV PACING PULSEWIDTH: 0.4 ms
MDC IDC STAT BRADY AS VP PERCENT: 0 %
MDC IDC STAT BRADY RA PERCENT PACED: 98.5 %

## 2017-12-09 ENCOUNTER — Telehealth: Payer: Self-pay | Admitting: Cardiovascular Disease

## 2017-12-09 MED ORDER — APIXABAN 5 MG PO TABS: 5.0000 mg | ORAL_TABLET | Freq: Two times a day (BID) | ORAL | 3 refills | Status: DC

## 2017-12-09 NOTE — Telephone Encounter (Signed)
°*  STAT* If patient is at the pharmacy, call can be transferred to refill team.   1. Which medications need to be refilled? (please list name of each medication and dose if known) Eliquis 5 mg   2. Which pharmacy/location (including street and city if local pharmacy) is [facility] sent [facility]   3. Do they need a 30 day or 90 day supply? 90 day

## 2017-12-09 NOTE — Telephone Encounter (Signed)
Pt age 82, wt 80.3 kg, Creatinin 8/18 1.11, creatinine clearance 61.29. Eliquis refill sent to [facility].

## 2017-12-09 NOTE — Telephone Encounter (Signed)
Refill Request.  

## 2018-02-04 ENCOUNTER — Telehealth: Payer: Self-pay | Admitting: Internal Medicine

## 2018-02-04 ENCOUNTER — Telehealth: Payer: Self-pay | Admitting: Cardiology

## 2018-02-04 ENCOUNTER — Ambulatory Visit (INDEPENDENT_AMBULATORY_CARE_PROVIDER_SITE_OTHER): Payer: Medicare Other | Admitting: *Deleted

## 2018-02-04 DIAGNOSIS — I495 Sick sinus syndrome: Secondary | ICD-10-CM

## 2018-02-04 NOTE — Telephone Encounter (Signed)
LMOVM reminding pt to send remote transmission.   

## 2018-02-04 NOTE — Telephone Encounter (Signed)
Spoke with patient's wife who stated that patient had been discharged from the hospital this afternoon. She states that they had the home monitor plugged in at the hospital - I explained that it likely did not send successfully for patients scheduled remote transmission due to poor service in the hospital. Instructed patients wife to plug in the home monitor (as they have not kept home monitor plugged in during travels). They will send remote transmission tomorrow- direct number given for troubleshooting if needed.

## 2018-02-04 NOTE — Telephone Encounter (Signed)
To Device Clinic.  

## 2018-02-04 NOTE — Telephone Encounter (Signed)
Pt wife states pt is in the hospital in [place] for pneumonia, and his pacer machine is not working. Please call.

## 2018-02-05 NOTE — Progress Notes (Signed)
Remote pacemaker transmission.   

## 2018-02-06 LAB — CUP PACEART REMOTE DEVICE CHECK
Battery Voltage: 3.02 V
Brady Statistic AS VS Percent: 3.26 %
Brady Statistic RA Percent Paced: 96.31 %
Brady Statistic RV Percent Paced: 0.31 %
Date Time Interrogation Session: 20190515144751
Implantable Lead Implant Date: 20070411
Implantable Lead Location: 753860
Implantable Lead Model: 5076
Implantable Pulse Generator Implant Date: 20170224
Lead Channel Impedance Value: 342 Ohm
Lead Channel Impedance Value: 361 Ohm
Lead Channel Pacing Threshold Amplitude: 0.625 V
Lead Channel Pacing Threshold Pulse Width: 0.4 ms
Lead Channel Pacing Threshold Pulse Width: 0.4 ms
Lead Channel Sensing Intrinsic Amplitude: 1.25 mV
Lead Channel Setting Pacing Amplitude: 2 V
Lead Channel Setting Sensing Sensitivity: 0.9 mV
MDC IDC LEAD IMPLANT DT: 20070411
MDC IDC LEAD LOCATION: 753859
MDC IDC MSMT BATTERY REMAINING LONGEVITY: 92 mo
MDC IDC MSMT LEADCHNL RA IMPEDANCE VALUE: 456 Ohm
MDC IDC MSMT LEADCHNL RA SENSING INTR AMPL: 1.25 mV
MDC IDC MSMT LEADCHNL RV IMPEDANCE VALUE: 418 Ohm
MDC IDC MSMT LEADCHNL RV PACING THRESHOLD AMPLITUDE: 1 V
MDC IDC MSMT LEADCHNL RV SENSING INTR AMPL: 5.25 mV
MDC IDC MSMT LEADCHNL RV SENSING INTR AMPL: 5.25 mV
MDC IDC SET LEADCHNL RV PACING AMPLITUDE: 2.5 V
MDC IDC SET LEADCHNL RV PACING PULSEWIDTH: 0.4 ms
MDC IDC STAT BRADY AP VP PERCENT: 0.13 %
MDC IDC STAT BRADY AP VS PERCENT: 96.47 %
MDC IDC STAT BRADY AS VP PERCENT: 0.14 %

## 2018-02-07 ENCOUNTER — Encounter: Payer: Self-pay | Admitting: Cardiology

## 2018-03-18 ENCOUNTER — Encounter: Payer: Self-pay | Admitting: Internal Medicine

## 2018-03-18 ENCOUNTER — Ambulatory Visit (INDEPENDENT_AMBULATORY_CARE_PROVIDER_SITE_OTHER): Payer: Medicare Other | Admitting: Internal Medicine

## 2018-03-18 VITALS — BP 120/70 | HR 80 | Ht 71.0 in | Wt 180.8 lb

## 2018-03-18 DIAGNOSIS — I77819 Aortic ectasia, unspecified site: Secondary | ICD-10-CM | POA: Diagnosis not present

## 2018-03-18 DIAGNOSIS — I480 Paroxysmal atrial fibrillation: Secondary | ICD-10-CM | POA: Diagnosis not present

## 2018-03-18 DIAGNOSIS — I2510 Atherosclerotic heart disease of native coronary artery without angina pectoris: Secondary | ICD-10-CM

## 2018-03-18 MED ORDER — IPRATROPIUM BROMIDE HFA 17 MCG/ACT IN AERS: 2.0000 | INHALATION_SPRAY | Freq: Four times a day (QID) | RESPIRATORY_TRACT | 0 refills | Status: DC

## 2018-03-18 MED ORDER — METOPROLOL SUCCINATE ER 50 MG PO TB24: ORAL_TABLET | ORAL | 3 refills | Status: DC

## 2018-03-18 MED ORDER — METOLAZONE 2.5 MG PO TABS: ORAL_TABLET | ORAL | 0 refills | Status: DC

## 2018-03-18 MED ORDER — ALBUTEROL SULFATE 108 (90 BASE) MCG/ACT IN AEPB: 108.0000 ug | INHALATION_SPRAY | Freq: Two times a day (BID) | RESPIRATORY_TRACT | 0 refills | Status: DC

## 2018-03-18 NOTE — Patient Instructions (Addendum)
Medication Instructions: - Your physician has recommended you make the following change in your medication:  1) START zaroxalyn (metolazone) 2.5 mg- take 1 tablet by mouth twice a week for 1 week  2) START albuterol - inhale 2 puffs into the lungs twice a day for 1 week  - refills have been sent in for your atrovent & metoprolol   Labwork: - none ordered  Procedures/Testing: - Your physician has recommended that you have a CT scan of your chest  Friday 03/21/18 at 3:30 pm  -Please [facility] at the [facility] at 3:15 pm - 4 hours prior to the test you may have liquids only  [address] (just off of [address]   Follow-Up: - Remote monitoring is used to monitor your Pacemaker of ICD from home. This monitoring reduces the number of office visits required to check your device to one time per year. It allows us to keep an eye on the functioning of your device to ensure it is working properly. You are scheduled for a device check from home on 05/06/18. You may send your transmission at any time that day. If you have a wireless device, the transmission will be sent automatically. After your physician reviews your transmission, you will receive a postcard with your next transmission date.  - Your physician wants you to follow-up in: 1 year with Dr. Graciela HusbandsKlein. You will receive a reminder letter in the mail two months in advance. If you don't receive a letter, please call our office to schedule the follow-up appointment.   Any Additional Special Instructions Will Be Listed Below (If Applicable).     If you need a refill on your cardiac medications before your next appointment, please call your pharmacy.

## 2018-03-18 NOTE — Progress Notes (Signed)
Patient Care Team: Katrinka Blazing, MD as PCP - Nelle Don, MD as PCP - Family Medicine Mariah Milling, Tollie Pizza, MD as Consulting Physician (Cardiology)   HPI  Kyle Morales is a 82 y.o. male Seen in  follow-up for pacemaker implanted for sinus node dysfunction remotely w gen change 2/17  Also atrial fibrillation dating at least back to 2014  for which he had been treated with amiodarone and warfarin.  Now on apixoban and no Anti-arrhythmic drugs   Seen in [place] 5/19 for pneumonia/bronhcitis/CHF  rx with Abx  Still w signif fatigue  Records reviewed    Some edema and interval 8 ob weight gain over the last 2 weeks  Metoprolol dose decreased 2/2 low bp  Fatigue no better   No orthostasis   DATE TEST    6/14 Myoview   EF 65 %   6/18 Echo 50         Date Cr K  8/18  1.11 4.2              Past Medical History:  Diagnosis Date  . CHF (congestive heart failure) (HCC)   . Coronary artery disease   . Eczema   . Hyperlipidemia   . Hypertension   . Osteoarthrosis   . Paroxysmal A-fib (HCC)   . Presence of permanent cardiac pacemaker   . S/P cardiac pacemaker procedure     Past Surgical History:  Procedure Laterality Date  . CARDIAC CATHETERIZATION  1997  . CARDIAC CATHETERIZATION  2000  . COLONOSCOPY  2012  . CORONARY ANGIOPLASTY  1997   ramus  . ESOPHAGOGASTRODUODENOSCOPY N/A 02/11/2017   Procedure: ESOPHAGOGASTRODUODENOSCOPY (EGD);  Surgeon: Christena Deem, MD;  Location[facility]OSCOPY;  Service: Endoscopy;  Laterality: N/A;  . HERNIA REPAIR    . INSERT / REPLACE / REMOVE PACEMAKER  12/2005   Medtronic [id number] H - placed in [place]  . MANDIBLE SURGERY    . TONSILLECTOMY    . TOTAL SHOULDER REPLACEMENT     bilateral     Current Outpatient Medications  Medication Sig Dispense Refill  . acetaminophen (TYLENOL) 500 MG tablet Take 500 mg by mouth every 8 (eight) hours as needed.    Marland Kitchen apixaban (ELIQUIS) 5 MG TABS tablet Take 1 tablet (5 mg  total) by mouth 2 (two) times daily. 180 tablet 3  . CVS ZINC 50 MG TABS Take 50 mg by mouth daily.    Marland Kitchen dicyclomine (BENTYL) 10 MG capsule Take 10 mg by mouth 2 (two) times daily.    . fluticasone (FLONASE) 50 MCG/ACT nasal spray Place 2 sprays into both nostrils 2 (two) times daily.    . furosemide (LASIX) 40 MG tablet Take 120 mg by mouth daily.     Marland Kitchen ipratropium (ATROVENT HFA) 17 MCG/ACT inhaler Inhale 2 puffs into the lungs 4 (four) times daily.    Marland Kitchen lovastatin (MEVACOR) 20 MG tablet Take 1 tablet (20 mg total) by mouth at bedtime. 90 tablet 3  . metoprolol succinate (TOPROL-XL) 50 MG 24 hr tablet Take two tablets in the am and one tablet in the pm. (Patient taking differently: Take 50 mg by mouth 2 (two) times daily. Take two tablets in the am and one tablet in the pm.) 270 tablet 3  . nitroGLYCERIN (NITROSTAT) 0.4 MG SL tablet Place 1 tablet (0.4 mg total) under the tongue every 5 (five) minutes as needed for chest pain. 25 tablet 1  . potassium chloride (K-DUR) 10  MEQ tablet Take 20 mEq by mouth daily.     . tamsulosin (FLOMAX) 0.4 MG CAPS capsule Take 0.8 mg by mouth daily after breakfast.      No current facility-administered medications for this visit.     Allergies  Allergen Reactions  . Amoxicillin Rash and Itching      Review of Systems negative except from HPI and PMH  Physical Exam BP 120/70 (BP Location: Left Arm, Patient Position: Sitting, Cuff Size: Normal)   Pulse 80   Ht 5\' 11"  (1.803 m)   Wt 180 lb 12 oz (82 kg)   BMI 25.21 kg/m  Well developed and nourished in no acute distress HENT normal Neck supple with JVP-8 Wheezes and cough w deep inspiration  Regular rate and rhythm, no murmurs or gallops Abd-soft with active BS No Clubbing cyanosis 2+ edema Skin-warm and dry A & Oriented  Grossly normal sensory and motor function   ECG  A paced 80 25/12/41 IRBBB  Assessment and  Plan Atrial fibrillation-paroxysmal  Sinus node  dysfunction  Pacemaker-Medtronic  Cardiomyopathy  Ectatic Aorta  Orthostatic lightheadedness  Shortness of breath COPD/ Asthma  HFpEF   VT NS  With volume overload will add zaroxlyn x 2 x this week  Will refill his atrovent and give him rescue inhaler  CXR report >> ectatic aorta, will do CTA to clarify aortic size, echo remote and hopefully will be able to clarify resolution of pneumonia  No intercurrent atrial fibrillation or flutter  On Anticoagulation;  No bleeding issues   More than 50% of 40 min was spent in counseling related to the above          Current medicines are reviewed at length with the patient today .  The patient does have concerns regarding medicines As above

## 2018-03-21 ENCOUNTER — Ambulatory Visit
Admission: RE | Admit: 2018-03-21 | Discharge: 2018-03-21 | Disposition: A | Discharge status: Home / Self Care | Payer: Medicare Other | Source: Ambulatory Visit | Attending: Internal Medicine | Admitting: Internal Medicine

## 2018-03-21 DIAGNOSIS — I77819 Aortic ectasia, unspecified site: Secondary | ICD-10-CM | POA: Insufficient documentation

## 2018-03-21 DIAGNOSIS — M419 Scoliosis, unspecified: Secondary | ICD-10-CM | POA: Diagnosis not present

## 2018-03-21 DIAGNOSIS — N281 Cyst of kidney, acquired: Secondary | ICD-10-CM | POA: Diagnosis not present

## 2018-03-21 DIAGNOSIS — I2510 Atherosclerotic heart disease of native coronary artery without angina pectoris: Secondary | ICD-10-CM | POA: Diagnosis not present

## 2018-03-21 LAB — POCT I-STAT CREATININE: CREATININE: 1.1 mg/dL (ref 0.61–1.24)

## 2018-03-21 MED ORDER — IOPAMIDOL (ISOVUE-370) INJECTION 76%
75.0000 mL | Freq: Once | INTRAVENOUS | Status: AC | PRN
  Administered 2018-03-21: 75 mL via INTRAVENOUS

## 2018-03-25 ENCOUNTER — Telehealth: Payer: Self-pay | Admitting: Internal Medicine

## 2018-03-25 NOTE — Telephone Encounter (Signed)
I called and spoke with Mrs. Kyle Morales (ok per DPR).  She is aware that the patient's CT scan has not be signed off by Dr. Graciela HusbandsKlein at this time and that I will touch base with her once this is signed.  She also updated us that his weight was 180 lbs last week and today he is 169 lbs. She reports his baseline weight is typically 172-174 lbs. He has taken 2 zaroxalyn over the past week. I advised her to not have him take any more of this. She voices understanding Will forward to Dr. Graciela HusbandsKlein as an Lorain ChildesFYI and to please sign off on his CT report.

## 2018-03-25 NOTE — Telephone Encounter (Signed)
Pt wife is calling asking for pt "CT scan " results that Dr. Graciela HusbandsKlein ordered.  States pt weighed 180 yesterday and today weighs 169. Please call to discuss.

## 2018-03-26 NOTE — Telephone Encounter (Signed)
I called and spoke with the patient regarding his CT results.

## 2018-05-06 ENCOUNTER — Telehealth: Payer: Self-pay | Admitting: Cardiology

## 2018-05-06 ENCOUNTER — Telehealth: Payer: Self-pay

## 2018-05-06 ENCOUNTER — Ambulatory Visit (INDEPENDENT_AMBULATORY_CARE_PROVIDER_SITE_OTHER): Payer: Medicare Other | Admitting: *Deleted

## 2018-05-06 DIAGNOSIS — I495 Sick sinus syndrome: Secondary | ICD-10-CM

## 2018-05-06 DIAGNOSIS — I951 Orthostatic hypotension: Secondary | ICD-10-CM

## 2018-05-06 NOTE — Progress Notes (Signed)
Remote pacemaker transmission.   

## 2018-05-06 NOTE — Telephone Encounter (Signed)
LVM for pt to call device clinic back regarding any symptoms on 05/18/18@6 :30am Mon. VT episode x 28 beats w/ V rate of 166bpm.

## 2018-05-06 NOTE — Telephone Encounter (Signed)
Confirmed remote transmission w/ pt wife.   

## 2018-05-07 ENCOUNTER — Telehealth: Payer: Self-pay

## 2018-05-07 NOTE — Telephone Encounter (Signed)
Pt wife was returning  [place] phone call

## 2018-05-07 NOTE — Telephone Encounter (Signed)
Mrs. Kyle Morales returning call. I believe the episode in question was 04/28/18 at 6:30am. [name] was not symptomatic at that time.

## 2018-05-28 LAB — CUP PACEART INCLINIC DEVICE CHECK
Battery Remaining Longevity: 91 mo
Battery Voltage: 3.01 V
Brady Statistic AP VP Percent: 0.12 %
Brady Statistic AP VS Percent: 96.55 %
Brady Statistic AS VS Percent: 3.21 %
Brady Statistic RA Percent Paced: 96.39 %
Date Time Interrogation Session: 20190625145353
Implantable Lead Implant Date: 20070411
Implantable Lead Location: 753860
Implantable Lead Model: 5076
Implantable Pulse Generator Implant Date: 20170224
Lead Channel Impedance Value: 342 Ohm
Lead Channel Impedance Value: 399 Ohm
Lead Channel Pacing Threshold Amplitude: 0.75 V
Lead Channel Pacing Threshold Amplitude: 1 V
Lead Channel Pacing Threshold Pulse Width: 0.4 ms
Lead Channel Sensing Intrinsic Amplitude: 2.625 mV
Lead Channel Setting Pacing Pulse Width: 0.4 ms
MDC IDC LEAD IMPLANT DT: 20070411
MDC IDC LEAD LOCATION: 753859
MDC IDC MSMT LEADCHNL RA IMPEDANCE VALUE: 456 Ohm
MDC IDC MSMT LEADCHNL RA PACING THRESHOLD PULSEWIDTH: 0.4 ms
MDC IDC MSMT LEADCHNL RV IMPEDANCE VALUE: 361 Ohm
MDC IDC MSMT LEADCHNL RV SENSING INTR AMPL: 6.125 mV
MDC IDC SET LEADCHNL RA PACING AMPLITUDE: 2 V
MDC IDC SET LEADCHNL RV PACING AMPLITUDE: 2.5 V
MDC IDC SET LEADCHNL RV SENSING SENSITIVITY: 0.9 mV
MDC IDC STAT BRADY AS VP PERCENT: 0.12 %
MDC IDC STAT BRADY RV PERCENT PACED: 0.27 %

## 2018-05-31 LAB — CUP PACEART REMOTE DEVICE CHECK
Battery Remaining Longevity: 89 mo
Battery Voltage: 3.01 V
Brady Statistic AP VS Percent: 95.54 %
Brady Statistic AS VS Percent: 4.28 %
Date Time Interrogation Session: 20190813150938
Implantable Lead Implant Date: 20070411
Implantable Lead Location: 753859
Implantable Lead Location: 753860
Implantable Lead Model: 5076
Implantable Lead Model: 5076
Lead Channel Impedance Value: 342 Ohm
Lead Channel Pacing Threshold Amplitude: 0.75 V
Lead Channel Sensing Intrinsic Amplitude: 1.125 mV
Lead Channel Sensing Intrinsic Amplitude: 1.125 mV
Lead Channel Sensing Intrinsic Amplitude: 5.5 mV
Lead Channel Sensing Intrinsic Amplitude: 5.5 mV
Lead Channel Setting Pacing Amplitude: 2.5 V
MDC IDC LEAD IMPLANT DT: 20070411
MDC IDC MSMT LEADCHNL RA IMPEDANCE VALUE: 456 Ohm
MDC IDC MSMT LEADCHNL RA PACING THRESHOLD PULSEWIDTH: 0.4 ms
MDC IDC MSMT LEADCHNL RV IMPEDANCE VALUE: 361 Ohm
MDC IDC MSMT LEADCHNL RV IMPEDANCE VALUE: 399 Ohm
MDC IDC MSMT LEADCHNL RV PACING THRESHOLD AMPLITUDE: 0.875 V
MDC IDC MSMT LEADCHNL RV PACING THRESHOLD PULSEWIDTH: 0.4 ms
MDC IDC PG IMPLANT DT: 20170224
MDC IDC SET LEADCHNL RA PACING AMPLITUDE: 2 V
MDC IDC SET LEADCHNL RV PACING PULSEWIDTH: 0.4 ms
MDC IDC SET LEADCHNL RV SENSING SENSITIVITY: 0.9 mV
MDC IDC STAT BRADY AP VP PERCENT: 0.16 %
MDC IDC STAT BRADY AS VP PERCENT: 0.02 %
MDC IDC STAT BRADY RA PERCENT PACED: 95.1 %
MDC IDC STAT BRADY RV PERCENT PACED: 0.23 %

## 2018-08-05 ENCOUNTER — Ambulatory Visit (INDEPENDENT_AMBULATORY_CARE_PROVIDER_SITE_OTHER): Payer: Medicare Other | Admitting: *Deleted

## 2018-08-05 DIAGNOSIS — I495 Sick sinus syndrome: Secondary | ICD-10-CM

## 2018-08-06 NOTE — Progress Notes (Signed)
Remote pacemaker transmission.   

## 2018-10-05 LAB — CUP PACEART REMOTE DEVICE CHECK
Battery Remaining Longevity: 87 mo
Battery Voltage: 3.01 V
Brady Statistic AP VP Percent: 0.12 %
Brady Statistic AS VS Percent: 2.88 %
Brady Statistic RV Percent Paced: 0.25 %
Date Time Interrogation Session: 20191112160417
Implantable Lead Implant Date: 20070411
Implantable Lead Implant Date: 20070411
Implantable Lead Location: 753859
Implantable Pulse Generator Implant Date: 20170224
Lead Channel Impedance Value: 342 Ohm
Lead Channel Impedance Value: 342 Ohm
Lead Channel Impedance Value: 399 Ohm
Lead Channel Pacing Threshold Amplitude: 0.625 V
Lead Channel Pacing Threshold Amplitude: 1 V
Lead Channel Sensing Intrinsic Amplitude: 1 mV
Lead Channel Sensing Intrinsic Amplitude: 5.5 mV
Lead Channel Setting Pacing Amplitude: 2 V
Lead Channel Setting Pacing Amplitude: 2.5 V
Lead Channel Setting Sensing Sensitivity: 0.9 mV
MDC IDC LEAD LOCATION: 753860
MDC IDC MSMT LEADCHNL RA IMPEDANCE VALUE: 437 Ohm
MDC IDC MSMT LEADCHNL RA PACING THRESHOLD PULSEWIDTH: 0.4 ms
MDC IDC MSMT LEADCHNL RA SENSING INTR AMPL: 1 mV
MDC IDC MSMT LEADCHNL RV PACING THRESHOLD PULSEWIDTH: 0.4 ms
MDC IDC MSMT LEADCHNL RV SENSING INTR AMPL: 5.5 mV
MDC IDC SET LEADCHNL RV PACING PULSEWIDTH: 0.4 ms
MDC IDC STAT BRADY AP VS PERCENT: 96.89 %
MDC IDC STAT BRADY AS VP PERCENT: 0.11 %
MDC IDC STAT BRADY RA PERCENT PACED: 96.16 %

## 2018-11-04 ENCOUNTER — Ambulatory Visit (INDEPENDENT_AMBULATORY_CARE_PROVIDER_SITE_OTHER): Payer: Medicare Other

## 2018-11-04 DIAGNOSIS — I495 Sick sinus syndrome: Secondary | ICD-10-CM | POA: Diagnosis not present

## 2018-11-04 LAB — CUP PACEART REMOTE DEVICE CHECK
Battery Voltage: 3.01 V
Brady Statistic AP VP Percent: 0.14 %
Brady Statistic AS VS Percent: 4.96 %
Brady Statistic RA Percent Paced: 94.17 %
Brady Statistic RV Percent Paced: 0.2 %
Implantable Lead Implant Date: 20070411
Lead Channel Impedance Value: 361 Ohm
Lead Channel Impedance Value: 399 Ohm
Lead Channel Impedance Value: 399 Ohm
Lead Channel Pacing Threshold Amplitude: 0.5 V
Lead Channel Pacing Threshold Amplitude: 1 V
Lead Channel Pacing Threshold Pulse Width: 0.4 ms
Lead Channel Setting Pacing Amplitude: 2 V
Lead Channel Setting Sensing Sensitivity: 0.9 mV
MDC IDC LEAD IMPLANT DT: 20070411
MDC IDC LEAD LOCATION: 753859
MDC IDC LEAD LOCATION: 753860
MDC IDC MSMT BATTERY REMAINING LONGEVITY: 79 mo
MDC IDC MSMT LEADCHNL RA IMPEDANCE VALUE: 342 Ohm
MDC IDC MSMT LEADCHNL RA SENSING INTR AMPL: 1.25 mV
MDC IDC MSMT LEADCHNL RA SENSING INTR AMPL: 1.25 mV
MDC IDC MSMT LEADCHNL RV PACING THRESHOLD PULSEWIDTH: 0.4 ms
MDC IDC MSMT LEADCHNL RV SENSING INTR AMPL: 4.875 mV
MDC IDC MSMT LEADCHNL RV SENSING INTR AMPL: 4.875 mV
MDC IDC PG IMPLANT DT: 20170224
MDC IDC SESS DTM: 20200211132309
MDC IDC SET LEADCHNL RV PACING AMPLITUDE: 2.5 V
MDC IDC SET LEADCHNL RV PACING PULSEWIDTH: 0.4 ms
MDC IDC STAT BRADY AP VS PERCENT: 94.87 %
MDC IDC STAT BRADY AS VP PERCENT: 0.03 %

## 2018-11-17 NOTE — Progress Notes (Signed)
Remote pacemaker transmission.   

## 2019-02-03 ENCOUNTER — Ambulatory Visit (INDEPENDENT_AMBULATORY_CARE_PROVIDER_SITE_OTHER): Payer: Medicare Other | Admitting: *Deleted

## 2019-02-03 ENCOUNTER — Other Ambulatory Visit: Payer: Self-pay

## 2019-02-03 DIAGNOSIS — I480 Paroxysmal atrial fibrillation: Secondary | ICD-10-CM

## 2019-02-03 DIAGNOSIS — I495 Sick sinus syndrome: Secondary | ICD-10-CM

## 2019-02-04 LAB — CUP PACEART REMOTE DEVICE CHECK
Battery Remaining Longevity: 77 mo
Battery Voltage: 3.01 V
Brady Statistic AP VP Percent: 0.1 %
Brady Statistic AP VS Percent: 95.02 %
Brady Statistic AS VP Percent: 0.02 %
Brady Statistic AS VS Percent: 4.86 %
Brady Statistic RA Percent Paced: 93.79 %
Brady Statistic RV Percent Paced: 0.14 %
Date Time Interrogation Session: 20200512114637
Implantable Lead Implant Date: 20070411
Implantable Lead Implant Date: 20070411
Implantable Lead Location: 753859
Implantable Lead Location: 753860
Implantable Lead Model: 5076
Implantable Lead Model: 5076
Implantable Pulse Generator Implant Date: 20170224
Lead Channel Impedance Value: 361 Ohm
Lead Channel Impedance Value: 361 Ohm
Lead Channel Impedance Value: 399 Ohm
Lead Channel Impedance Value: 475 Ohm
Lead Channel Pacing Threshold Amplitude: 0.625 V
Lead Channel Pacing Threshold Amplitude: 0.875 V
Lead Channel Pacing Threshold Pulse Width: 0.4 ms
Lead Channel Pacing Threshold Pulse Width: 0.4 ms
Lead Channel Sensing Intrinsic Amplitude: 1.125 mV
Lead Channel Sensing Intrinsic Amplitude: 1.125 mV
Lead Channel Sensing Intrinsic Amplitude: 5 mV
Lead Channel Sensing Intrinsic Amplitude: 5 mV
Lead Channel Setting Pacing Amplitude: 2 V
Lead Channel Setting Pacing Amplitude: 2.5 V
Lead Channel Setting Pacing Pulse Width: 0.4 ms
Lead Channel Setting Sensing Sensitivity: 0.9 mV

## 2019-02-11 ENCOUNTER — Other Ambulatory Visit: Payer: Self-pay

## 2019-02-20 NOTE — Progress Notes (Signed)
Remote pacemaker transmission.   

## 2019-03-31 ENCOUNTER — Other Ambulatory Visit: Payer: Self-pay | Admitting: Cardiovascular Disease

## 2019-03-31 ENCOUNTER — Telehealth: Payer: Self-pay | Admitting: Internal Medicine

## 2019-03-31 MED ORDER — APIXABAN 5 MG PO TABS: 5.0000 mg | ORAL_TABLET | Freq: Two times a day (BID) | ORAL | 0 refills | Status: DC

## 2019-03-31 NOTE — Telephone Encounter (Signed)
Please review for refill. Thanks!  

## 2019-03-31 NOTE — Telephone Encounter (Signed)
°  1. Has your device fired? NO  2. Is you device beeping? NO  3. Are you experiencing draining or swelling at device site? NO  4. Are you calling to see if we received your device transmission? NO  5. Have you passed out? NO  Patients wife is concerned that at patients next remote device check he might receive an EKG and she does not want him to have one and then be billed at his next appointment for another EKG.  Please advise the wife on what to do  Thank you    Please route to [place]

## 2019-03-31 NOTE — Telephone Encounter (Signed)
°*  STAT* If patient is at the pharmacy, call can be transferred to refill team.   1. Which medications need to be refilled? (please list name of each medication and dose if known) Eliquis 5mg   2. Which pharmacy/location (including street and city if local pharmacy) is medication to be sent to? [facility]  3. Do they need a 30 day or 90 day supply? 90 days

## 2019-03-31 NOTE — Telephone Encounter (Signed)
Spoke with pt's wife, advised that PPM remote checks and EKG are two separate charges and do not cause a billing issue if they occur a week apart. Pt's wife verbalizes understanding and denies additional questions or concerns at this time.

## 2019-03-31 NOTE — Telephone Encounter (Signed)
83 years old Last OV 03/18/18- has one scheduled in august 82kg Scr 1.10 on 03/21/2018 Will need labs at august appointment. Will give 3 month supply no refills to get to august appointment

## 2019-05-05 ENCOUNTER — Ambulatory Visit (INDEPENDENT_AMBULATORY_CARE_PROVIDER_SITE_OTHER): Payer: Medicare Other | Admitting: *Deleted

## 2019-05-05 DIAGNOSIS — I495 Sick sinus syndrome: Secondary | ICD-10-CM

## 2019-05-06 LAB — CUP PACEART REMOTE DEVICE CHECK
Battery Remaining Longevity: 76 mo
Battery Voltage: 3.01 V
Brady Statistic AP VP Percent: 0.17 %
Brady Statistic AP VS Percent: 97.19 %
Brady Statistic AS VP Percent: 0.03 %
Brady Statistic AS VS Percent: 2.61 %
Brady Statistic RA Percent Paced: 96.26 %
Brady Statistic RV Percent Paced: 0.26 %
Date Time Interrogation Session: 20200811121011
Implantable Lead Implant Date: 20070411
Implantable Lead Implant Date: 20070411
Implantable Lead Location: 753859
Implantable Lead Location: 753860
Implantable Lead Model: 5076
Implantable Lead Model: 5076
Implantable Pulse Generator Implant Date: 20170224
Lead Channel Impedance Value: 342 Ohm
Lead Channel Impedance Value: 361 Ohm
Lead Channel Impedance Value: 399 Ohm
Lead Channel Impedance Value: 456 Ohm
Lead Channel Pacing Threshold Amplitude: 0.625 V
Lead Channel Pacing Threshold Amplitude: 1.125 V
Lead Channel Pacing Threshold Pulse Width: 0.4 ms
Lead Channel Pacing Threshold Pulse Width: 0.4 ms
Lead Channel Sensing Intrinsic Amplitude: 1.125 mV
Lead Channel Sensing Intrinsic Amplitude: 1.125 mV
Lead Channel Sensing Intrinsic Amplitude: 5.5 mV
Lead Channel Sensing Intrinsic Amplitude: 5.5 mV
Lead Channel Setting Pacing Amplitude: 2 V
Lead Channel Setting Pacing Amplitude: 2.5 V
Lead Channel Setting Pacing Pulse Width: 0.4 ms
Lead Channel Setting Sensing Sensitivity: 0.9 mV

## 2019-05-13 ENCOUNTER — Encounter: Payer: Self-pay | Admitting: Cardiology

## 2019-05-13 NOTE — Progress Notes (Signed)
Remote pacemaker transmission.   

## 2019-05-16 NOTE — Progress Notes (Signed)
Cardiology Office Note  Date:  05/18/2019   ID:  Higinio RogerJames M Snapp, DOB 1936-06-16, MRN [id number]  PCP:  Katrinka BlazingHalpert, Karen D, MD   Chief Complaint  Patient presents with  . Other    Patient denies chest pain and SOB. Meds reviewed verbally with patient.     HPI:  [name] is a  83 year old gentleman history of  paroxysmal atrial fibrillation dating back to 1992   coronary artery disease with [facility],  pacemaker placement in [place] in 2007 for diagnosis of heart block nonsustained VT,  chronic diarrhea [facility],  short run of nonsustained VT, 9 seconds Who presents for followup of his atrial fibrillation and coronary artery disease.  [place] No major virus issues  Compresisons, no swelling Every 10 days gets a balance issue Possibly low blood pressure  Pacer download 3 AF, longest 3 hours. On Eliquis. 32 VT episodes, available EGM's show VT 160-170's, longest available episode >10 seconds.   Continues to take lasix 120 daily On Prostate meds CR 0.876 UTI, on ABX  EKG personally reviewed by myself on todays visit Shows normal sinus rhythm with rate 72 bpm right bundle branch block  Other past medical history reviewed Echocardiogram March 14, 2017 Results discussed with him in detail Ejection fraction 50-55%, mildly dilated aortic root 39 mm  Left atrium severely dilated, mild to moderate MR  Total cholesterol 136, LDL 70   seen by Dr. Achilles Dunkope and had cystoscopy, started on tamsulosin presumably for BPH [facility]. He was told that he is not having frequent episodes of atrial fibrillation Blood pressure has been stable, minimal orthostasis. Previously held losartan for low blood pressure  Previous history of atrial fibrillation in the setting of GI distress and diarrhea   cultures were negative. Managed by GI   seen in the emergency room July 15 2014  for cough He was diagnosed with upper  respiratory infection.   He went to the ED on 06/14/14 for his chest pain, found to have atrial fibrillation, HR of 148, C diff negative.  started on amiodarone 400 mg bid x 1 week-->200 mg bid-->200 mg daily in an effort to cardiovert him. He converted to NSR for a brief period his first night in the hospital but went back into a-fib and remained in rate controlled a-fib (80s). He was adequately anticoagulated on warfarin at baseline. He converted to normal sinus rhythm in office follow-up   He had episode of atrial fibrillation August 2014. No further episodes since that time.  He is not doing a regular exercise program, has found his balance is poor.  EKG on today's visit shows normal sinus rhythm with rate 73 bpm, right bundle branch block  history of nonsustained VT. Metoprolol dose was increased from 25-100 mg daily.  Last stress test was 3-4 years ago.  In May 2014 he was traveling, could not take his medications on regular basis and developed significant lower extremity edema. 03/04/2013 he presented to the emergency room atrial fibrillation, palpitations. He was given IV fluids and he believes that this converted him back to normal sinus rhythm. His biggest concern is the CT scan showing right lower lobe pneumonia versus mass lesion. He was given several days of antibiotics and instructed to have followup imaging for this finding in the lower lobe.  [facility] to his ramus Stress test June 2009 with a normal study, ejection  fraction 65%   PMH:   has a past medical history of CHF (congestive heart failure) (HCC), Coronary artery disease, Eczema, Hyperlipidemia, Hypertension, Osteoarthrosis, Paroxysmal A-fib (HCC), Presence of permanent cardiac pacemaker, and S/P cardiac pacemaker procedure.  PSH:    Past Surgical History:  Procedure Laterality Date  . CARDIAC CATHETERIZATION  1997  . CARDIAC CATHETERIZATION  2000  . COLONOSCOPY  2012  . CORONARY  ANGIOPLASTY  1997   ramus  . ESOPHAGOGASTRODUODENOSCOPY N/A 02/11/2017   Procedure: ESOPHAGOGASTRODUODENOSCOPY (EGD);  Surgeon: Christena DeemSkulskie, Martin U, MD;  [facility];  Service: Endoscopy;  Laterality: N/A;  . HERNIA REPAIR    . INSERT / REPLACE / REMOVE PACEMAKER  12/2005   Medtronic [id number] H - placed in [place]  . MANDIBLE SURGERY    . TONSILLECTOMY    . TOTAL SHOULDER REPLACEMENT     bilateral     Current Outpatient Medications  Medication Sig Dispense Refill  . acetaminophen (TYLENOL) 500 MG tablet Take 500 mg by mouth every 8 (eight) hours as needed.    . Albuterol Sulfate 108 (90 Base) MCG/ACT AEPB Inhale 108 mcg into the lungs 2 (two) times daily. For 1 week 1 each 0  . apixaban (ELIQUIS) 5 MG TABS tablet Take 1 tablet (5 mg total) by mouth 2 (two) times daily. 180 tablet 3  . CVS ZINC 50 MG TABS Take 50 mg by mouth daily.    Marland Kitchen. dicyclomine (BENTYL) 10 MG capsule Take 10 mg by mouth 2 (two) times daily.    . finasteride (PROSCAR) 5 MG tablet Take 5 mg by mouth daily.    . furosemide (LASIX) 40 MG tablet Take 120 mg by mouth daily.     Marland Kitchen. ipratropium (ATROVENT HFA) 17 MCG/ACT inhaler Inhale 2 puffs into the lungs 4 (four) times daily. 1 Inhaler 0  . lovastatin (MEVACOR) 20 MG tablet Take 1 tablet (20 mg total) by mouth at bedtime. 90 tablet 3  . metolazone (ZAROXOLYN) 2.5 MG tablet Take 1 tablet (2.5 mg) by mouth twice a week for 1 week 5 tablet 0  . metoprolol succinate (TOPROL-XL) 50 MG 24 hr tablet Take 1 tablet (50 mg) by mouth twice daily. Take with or immediately following a meal. 180 tablet 3  . nitrofurantoin, macrocrystal-monohydrate, (MACROBID) 100 MG capsule Take 100 mg by mouth 2 (two) times daily.    . nitroGLYCERIN (NITROSTAT) 0.4 MG SL tablet Place 1 tablet (0.4 mg total) under the tongue every 5 (five) minutes as needed for chest pain. 25 tablet 1  . potassium chloride (K-DUR) 10 MEQ tablet Take 20 mEq by mouth daily.     . tamsulosin (FLOMAX) 0.4 MG CAPS  capsule Take 0.8 mg by mouth daily after breakfast.     . fluticasone (FLONASE) 50 MCG/ACT nasal spray Place 2 sprays into both nostrils 2 (two) times daily.     No current facility-administered medications for this visit.      Allergies:   Amoxicillin   Social History:  The patient  reports that he has never smoked. He has never used smokeless tobacco. He reports that he does not drink alcohol or use drugs.   Family History:   family history includes Heart attack in his father; Heart disease in his brother and mother; Hyperlipidemia in his mother; Hypertension in his mother.    Review of Systems: Review of Systems  Constitutional: Negative.   HENT: Negative.   Respiratory: Negative.   Cardiovascular: Negative.   Gastrointestinal: Negative.   Musculoskeletal: Negative.  Leg weakness  Neurological: Positive for dizziness.  Psychiatric/Behavioral: Negative.   All other systems reviewed and are negative.   PHYSICAL EXAM: VS:  BP 118/70 (BP Location: Left Arm, Patient Position: Sitting, Cuff Size: Normal)   Pulse 72   Ht 5\' 11"  (1.803 m)   Wt 174 lb 8 oz (79.2 kg)   BMI 24.34 kg/m  , BMI Body mass index is 24.34 kg/m. Constitutional:  oriented to person, place, and time. No distress.  HENT:  Head: Grossly normal Eyes:  no discharge. No scleral icterus.  Neck: No JVD, no carotid bruits  Cardiovascular: Regular rate and rhythm, no murmurs appreciated Pulmonary/Chest: Clear to auscultation bilaterally, no wheezes or rails Abdominal: Soft.  no distension.  no tenderness.  Musculoskeletal: Normal range of motion Neurological:  normal muscle tone. Coordination normal. No atrophy Skin: Skin warm and dry Psychiatric: normal affect, pleasant   Recent Labs: No results found for requested labs within last 8760 hours.    Lipid Panel Lab Results  Component Value Date   CHOL 136 02/20/2017   HDL 32 (L) 02/20/2017   LDLCALC 70 02/20/2017   TRIG 172 (H) 02/20/2017       Wt Readings from Last 3 Encounters:  05/18/19 174 lb 8 oz (79.2 kg)  03/18/18 180 lb 12 oz (82 kg)  11/14/17 177 lb (80.3 kg)       ASSESSMENT AND PLAN:   Atrial fibrillation, unspecified type ([place]) - Plan: EKG 12-Lead And pacer download reviewed with him, low burden of atrial fibrillation Tolerating Eliquis  5 mg twice a day  amiodarone has been held with improvement in TSH Asymptomatic  Pure hypercholesterolemia Encouraged him to stay on his lovastatin Cholesterol at goal  Atherosclerosis of native coronary artery of native heart without angina pectoris Currently with no symptoms of angina. No further workup at this time. Continue current medication regimen.  Stable  Orthostatic hypotension Recommend he check orthostatic blood pressures at home especially when he feels weak Suggested he may want to miss Lasix on days for low blood pressure No significant edema on today's visit  Leg swelling Suspect a component of venous insufficiency Recommend compression hose Avoid excessive diuresis May need to skip a day or 2 of Lasix per week  Hypothyroidism Thyroid back to normal Amiodarone previously held   Total encounter time more than 25 minutes  Greater than 50% was spent in counseling and coordination of care with the patient   Disposition:   F/U  12 months   Orders Placed This Encounter  Procedures  . EKG 12-Lead     Signed, Esmond Plants, M.D., Ph.D. 05/18/2019  [place], [place]

## 2019-05-18 ENCOUNTER — Ambulatory Visit (INDEPENDENT_AMBULATORY_CARE_PROVIDER_SITE_OTHER): Payer: Medicare Other | Admitting: Cardiovascular Disease

## 2019-05-18 ENCOUNTER — Encounter: Payer: Self-pay | Admitting: Cardiovascular Disease

## 2019-05-18 ENCOUNTER — Other Ambulatory Visit: Payer: Self-pay

## 2019-05-18 VITALS — BP 118/70 | HR 72 | Ht 71.0 in | Wt 174.5 lb

## 2019-05-18 DIAGNOSIS — I25118 Atherosclerotic heart disease of native coronary artery with other forms of angina pectoris: Secondary | ICD-10-CM

## 2019-05-18 DIAGNOSIS — I5032 Chronic diastolic (congestive) heart failure: Secondary | ICD-10-CM

## 2019-05-18 DIAGNOSIS — I480 Paroxysmal atrial fibrillation: Secondary | ICD-10-CM | POA: Diagnosis not present

## 2019-05-18 DIAGNOSIS — I495 Sick sinus syndrome: Secondary | ICD-10-CM | POA: Diagnosis not present

## 2019-05-18 DIAGNOSIS — I77819 Aortic ectasia, unspecified site: Secondary | ICD-10-CM | POA: Diagnosis not present

## 2019-05-18 DIAGNOSIS — I951 Orthostatic hypotension: Secondary | ICD-10-CM | POA: Diagnosis not present

## 2019-05-18 DIAGNOSIS — Z950 Presence of cardiac pacemaker: Secondary | ICD-10-CM

## 2019-05-18 MED ORDER — APIXABAN 5 MG PO TABS: 5.0000 mg | ORAL_TABLET | Freq: Two times a day (BID) | ORAL | 3 refills | Status: DC

## 2019-05-18 NOTE — Patient Instructions (Signed)

## 2019-08-04 ENCOUNTER — Ambulatory Visit (INDEPENDENT_AMBULATORY_CARE_PROVIDER_SITE_OTHER): Payer: Medicare Other | Admitting: *Deleted

## 2019-08-04 DIAGNOSIS — I5032 Chronic diastolic (congestive) heart failure: Secondary | ICD-10-CM | POA: Diagnosis not present

## 2019-08-04 DIAGNOSIS — I495 Sick sinus syndrome: Secondary | ICD-10-CM

## 2019-08-04 LAB — CUP PACEART REMOTE DEVICE CHECK
Battery Remaining Longevity: 75 mo
Battery Voltage: 3.01 V
Brady Statistic AP VP Percent: 0.17 %
Brady Statistic AP VS Percent: 97.57 %
Brady Statistic AS VP Percent: 0.08 %
Brady Statistic AS VS Percent: 2.17 %
Brady Statistic RA Percent Paced: 97.38 %
Brady Statistic RV Percent Paced: 0.3 %
Date Time Interrogation Session: 20201110125408
Implantable Lead Implant Date: 20070411
Implantable Lead Implant Date: 20070411
Implantable Lead Location: 753859
Implantable Lead Location: 753860
Implantable Lead Model: 5076
Implantable Lead Model: 5076
Implantable Pulse Generator Implant Date: 20170224
Lead Channel Impedance Value: 342 Ohm
Lead Channel Impedance Value: 361 Ohm
Lead Channel Impedance Value: 399 Ohm
Lead Channel Impedance Value: 456 Ohm
Lead Channel Pacing Threshold Amplitude: 0.75 V
Lead Channel Pacing Threshold Amplitude: 1.125 V
Lead Channel Pacing Threshold Pulse Width: 0.4 ms
Lead Channel Pacing Threshold Pulse Width: 0.4 ms
Lead Channel Sensing Intrinsic Amplitude: 1.125 mV
Lead Channel Sensing Intrinsic Amplitude: 1.125 mV
Lead Channel Sensing Intrinsic Amplitude: 4.875 mV
Lead Channel Sensing Intrinsic Amplitude: 4.875 mV
Lead Channel Setting Pacing Amplitude: 2 V
Lead Channel Setting Pacing Amplitude: 2.5 V
Lead Channel Setting Pacing Pulse Width: 0.4 ms
Lead Channel Setting Sensing Sensitivity: 0.9 mV

## 2019-08-25 NOTE — Progress Notes (Signed)
Remote pacemaker transmission.   

## 2019-11-03 ENCOUNTER — Ambulatory Visit (INDEPENDENT_AMBULATORY_CARE_PROVIDER_SITE_OTHER): Payer: Medicare Other | Admitting: *Deleted

## 2019-11-03 DIAGNOSIS — I5032 Chronic diastolic (congestive) heart failure: Secondary | ICD-10-CM

## 2019-11-03 LAB — CUP PACEART REMOTE DEVICE CHECK
Battery Remaining Longevity: 68 mo
Battery Voltage: 3.01 V
Brady Statistic AP VP Percent: 0.19 %
Brady Statistic AP VS Percent: 96.95 %
Brady Statistic AS VP Percent: 0.09 %
Brady Statistic AS VS Percent: 2.77 %
Brady Statistic RA Percent Paced: 96.69 %
Brady Statistic RV Percent Paced: 0.34 %
Date Time Interrogation Session: 20210209082006
Implantable Lead Implant Date: 20070411
Implantable Lead Implant Date: 20070411
Implantable Lead Location: 753859
Implantable Lead Location: 753860
Implantable Lead Model: 5076
Implantable Lead Model: 5076
Implantable Pulse Generator Implant Date: 20170224
Lead Channel Impedance Value: 342 Ohm
Lead Channel Impedance Value: 342 Ohm
Lead Channel Impedance Value: 380 Ohm
Lead Channel Impedance Value: 456 Ohm
Lead Channel Pacing Threshold Amplitude: 0.75 V
Lead Channel Pacing Threshold Amplitude: 1 V
Lead Channel Pacing Threshold Pulse Width: 0.4 ms
Lead Channel Pacing Threshold Pulse Width: 0.4 ms
Lead Channel Sensing Intrinsic Amplitude: 1 mV
Lead Channel Sensing Intrinsic Amplitude: 1 mV
Lead Channel Sensing Intrinsic Amplitude: 5.5 mV
Lead Channel Sensing Intrinsic Amplitude: 5.5 mV
Lead Channel Setting Pacing Amplitude: 2 V
Lead Channel Setting Pacing Amplitude: 2.5 V
Lead Channel Setting Pacing Pulse Width: 0.4 ms
Lead Channel Setting Sensing Sensitivity: 0.9 mV

## 2019-11-04 NOTE — Progress Notes (Signed)
PPM Remote  

## 2019-12-22 ENCOUNTER — Other Ambulatory Visit: Payer: Self-pay

## 2019-12-22 ENCOUNTER — Encounter: Payer: Self-pay | Admitting: Internal Medicine

## 2019-12-22 ENCOUNTER — Ambulatory Visit (INDEPENDENT_AMBULATORY_CARE_PROVIDER_SITE_OTHER): Payer: Medicare Other | Admitting: Internal Medicine

## 2019-12-22 VITALS — BP 117/80 | HR 78 | Ht 70.0 in | Wt 174.0 lb

## 2019-12-22 DIAGNOSIS — I495 Sick sinus syndrome: Secondary | ICD-10-CM | POA: Diagnosis not present

## 2019-12-22 DIAGNOSIS — I480 Paroxysmal atrial fibrillation: Secondary | ICD-10-CM | POA: Diagnosis not present

## 2019-12-22 DIAGNOSIS — Z950 Presence of cardiac pacemaker: Secondary | ICD-10-CM | POA: Diagnosis not present

## 2019-12-22 MED ORDER — METOPROLOL SUCCINATE ER 50 MG PO TB24: ORAL_TABLET | ORAL | 3 refills | Status: DC

## 2019-12-22 NOTE — Progress Notes (Signed)
Patient Care Team: Konrad Saha, MD as PCP - Samuella Cota, MD as PCP - Family Medicine Rockey Situ, Kathlene November, MD as Consulting Physician (Cardiology)   HPI  Kyle Morales is a 84 y.o. male Seen in  follow-up for pacemaker implanted for sinus node dysfunction remotely w gen change 2/17  Also atrial fibrillation dating at least back to 2014  for which he had been treated with amiodarone and warfarin.  Now on apixoban and no Anti-arrhythmic drugs   The patient denies chest pain, shortness of breath, nocturnal dyspnea, orthopnea    There have been no palpitations, lightheadedness or syncope.   Chronic edema but considerably better   On Anticoagulation;  No bleeding issues        DATE TEST    6/14 Myoview   EF 65 %   6/18 Echo 50   6/19 CTA  Aorta 3.7 cm    Date Cr K Hgb  8/18  1.11 4.2    5/20 0.87 4.3 15.2         Past Medical History:  Diagnosis Date  . CHF (congestive heart failure) ([place])   . Coronary artery disease   . Eczema   . Hyperlipidemia   . Hypertension   . Osteoarthrosis   . Paroxysmal A-fib ([place])   . Presence of permanent cardiac pacemaker   . S/P cardiac pacemaker procedure     Past Surgical History:  Procedure Laterality Date  . CARDIAC CATHETERIZATION  1997  . CARDIAC CATHETERIZATION  2000  . COLONOSCOPY  2012  . CORONARY ANGIOPLASTY  1997   ramus  . ESOPHAGOGASTRODUODENOSCOPY N/A 02/11/2017   Procedure: ESOPHAGOGASTRODUODENOSCOPY (EGD);  Surgeon: Lollie Sails, MD;  [facility];  Service: Endoscopy;  Laterality: N/A;  . HERNIA REPAIR    . INSERT / REPLACE / REMOVE PACEMAKER  12/2005   Medtronic [id number] H - placed in [place]  . MANDIBLE SURGERY    . TONSILLECTOMY    . TOTAL SHOULDER REPLACEMENT     bilateral     Current Outpatient Medications  Medication Sig Dispense Refill  . acetaminophen (TYLENOL) 500 MG tablet Take 500 mg by mouth every 8 (eight) hours as needed.    . Albuterol Sulfate 108 (90  Base) MCG/ACT AEPB Inhale 108 mcg into the lungs 2 (two) times daily. For 1 week 1 each 0  . apixaban (ELIQUIS) 5 MG TABS tablet Take 1 tablet (5 mg total) by mouth 2 (two) times daily. 180 tablet 3  . CVS ZINC 50 MG TABS Take 50 mg by mouth daily.    Marland Kitchen dicyclomine (BENTYL) 10 MG capsule Take 10 mg by mouth 2 (two) times daily.    . finasteride (PROSCAR) 5 MG tablet Take 5 mg by mouth daily.    . fluticasone (FLONASE) 50 MCG/ACT nasal spray Place 2 sprays into both nostrils 2 (two) times daily.    . furosemide (LASIX) 40 MG tablet Take 120 mg by mouth daily.     Marland Kitchen ipratropium (ATROVENT HFA) 17 MCG/ACT inhaler Inhale 2 puffs into the lungs 4 (four) times daily. 1 Inhaler 0  . lovastatin (MEVACOR) 20 MG tablet Take 1 tablet (20 mg total) by mouth at bedtime. 90 tablet 3  . metolazone (ZAROXOLYN) 2.5 MG tablet Take 1 tablet (2.5 mg) by mouth twice a week for 1 week 5 tablet 0  . metoprolol succinate (TOPROL-XL) 50 MG 24 hr tablet Take 1 tablet (50 mg) by mouth twice daily. Take with  or immediately following a meal. 180 tablet 3  . nitrofurantoin, macrocrystal-monohydrate, (MACROBID) 100 MG capsule Take 100 mg by mouth 2 (two) times daily.    . nitroGLYCERIN (NITROSTAT) 0.4 MG SL tablet Place 1 tablet (0.4 mg total) under the tongue every 5 (five) minutes as needed for chest pain. 25 tablet 1  . potassium chloride (K-DUR) 10 MEQ tablet Take 20 mEq by mouth daily.     . tamsulosin (FLOMAX) 0.4 MG CAPS capsule Take 0.8 mg by mouth daily after breakfast.      No current facility-administered medications for this visit.    Allergies  Allergen Reactions  . Amoxicillin Rash and Itching      Review of Systems negative except from HPI and PMH  Physical Exam Ht 5\' 10"  (1.778 m)   Wt 174 lb (78.9 kg)   BMI 24.97 kg/m   BP 117/80 (BP Location: Left Arm, Patient Position: Sitting, Cuff Size: Normal)   Pulse 78   Ht 5\' 10"  (1.778 m)   Wt 174 lb (78.9 kg)   SpO2 98%   BMI 24.97 kg/m   Well  developed and well nourished in no acute distress HENT normal Neck supple with JVP  Clear Device pocket well healed; without hematoma or erythema.  There is no tethering  Regular rate and rhythm, no  gallop No  murmur Abd-soft with active BS No Clubbing cyanosis 1-2+ edema Skin-warm and dry A & Oriented  Grossly normal sensory and motor function  ECG A pacing @ 78 28/12/42   Assessment and  Plan Atrial fibrillation-paroxysmal  Sinus node dysfunction  Pacemaker-Medtronic The patient's device was interrogated.  The information was reviewed. No changes were made in the programming.     Cardiomyopathy  Ectatic Aorta  Orthostatic lightheadedness  Shortness of breath COPD/ Asthma  HFpEF   VT NS    AFib paroxysmal without symptoms,  No bleeding    Euvolemic continue current meds-- chronic edmea  VT Nonsustained-- electrolytes were normal 5/20  To see PCP in 2 weeks and will check blood work at that time   Orthostatic LH improved        Current medicines are reviewed at length with the patient today .  The patient does have concerns regarding medicines As above

## 2019-12-22 NOTE — Patient Instructions (Signed)

## 2020-01-08 LAB — CUP PACEART INCLINIC DEVICE CHECK
Battery Remaining Longevity: 66 mo
Battery Voltage: 3 V
Brady Statistic AP VP Percent: 0.15 %
Brady Statistic AP VS Percent: 96.41 %
Brady Statistic AS VP Percent: 0.05 %
Brady Statistic AS VS Percent: 3.38 %
Brady Statistic RA Percent Paced: 95.78 %
Brady Statistic RV Percent Paced: 0.24 %
Date Time Interrogation Session: 20210330100100
Implantable Lead Implant Date: 20070411
Implantable Lead Implant Date: 20070411
Implantable Lead Location: 753859
Implantable Lead Location: 753860
Implantable Lead Model: 5076
Implantable Lead Model: 5076
Implantable Pulse Generator Implant Date: 20170224
Lead Channel Impedance Value: 342 Ohm
Lead Channel Impedance Value: 342 Ohm
Lead Channel Impedance Value: 399 Ohm
Lead Channel Impedance Value: 494 Ohm
Lead Channel Pacing Threshold Amplitude: 0.75 V
Lead Channel Pacing Threshold Amplitude: 1.25 V
Lead Channel Pacing Threshold Pulse Width: 0.4 ms
Lead Channel Pacing Threshold Pulse Width: 0.4 ms
Lead Channel Sensing Intrinsic Amplitude: 2 mV
Lead Channel Sensing Intrinsic Amplitude: 5.375 mV
Lead Channel Setting Pacing Amplitude: 2 V
Lead Channel Setting Pacing Amplitude: 2.5 V
Lead Channel Setting Pacing Pulse Width: 0.4 ms
Lead Channel Setting Sensing Sensitivity: 0.9 mV

## 2020-01-13 ENCOUNTER — Telehealth: Payer: Self-pay | Admitting: Cardiovascular Disease

## 2020-01-13 NOTE — Telephone Encounter (Signed)
   Primary Cardiologist: No primary care provider on file.  Chart reviewed as part of pre-operative protocol coverage. Simple dental extractions are considered low risk procedures per guidelines and generally do not require any specific cardiac clearance. It is also generally accepted that for simple extractions and dental cleanings, there is no need to interrupt blood thinner therapy.   SBE prophylaxis is not recommended for the patient.  I will route this recommendation to the requesting party via Epic fax function and remove from pre-op pool.  Please call with questions.  Joni Reining, NP 01/13/2020, 11:12 AM

## 2020-01-13 NOTE — Telephone Encounter (Signed)
   [facility] Pre-operative Risk Assessment    Request for surgical clearance:  1. What type of surgery is being performed?  EXTRACTION OF 2 TEETH  2. When is this surgery scheduled? 01/25/20  3. What type of clearance is required (medical clearance vs. Pharmacy clearance to hold med vs. Both)? NOT LISTED  4. Are there any medications that need to be held prior to surgery and how long? NOT LISTED  5. Practice name and name of physician performing surgery? [facility]  6. What is your office phone number [phone]   7.   What is your office fax number [phone]  8.   Anesthesia type (None, local, MAC, general) local  Kyle Morales 01/13/2020, 10:47 AM  _________________________________________________________________   (provider comments below)

## 2020-01-21 NOTE — Telephone Encounter (Signed)
[facility] calling Has not received clearance Please review and refax to [phone]

## 2020-01-22 ENCOUNTER — Telehealth: Payer: Self-pay | Admitting: Cardiovascular Disease

## 2020-01-22 NOTE — Telephone Encounter (Signed)
Paper copy of clearance has been faxed to [phone]. Confirmation received that fax was successful.

## 2020-01-22 NOTE — Telephone Encounter (Signed)
Fax numbers  [phone] or [phone]

## 2020-01-22 NOTE — Telephone Encounter (Signed)
Dr. Eliezer Lofts called with additional fax numbers. States this clearance needs to be received ASAP, surgery is on Monday 5/3

## 2020-01-22 NOTE — Telephone Encounter (Signed)
Patient states he doesn't know how long to hold meds and hasn't heard instructions from dentist yet.  Please call to discuss

## 2020-01-22 NOTE — Telephone Encounter (Signed)
I just s/w the pt and he has been made aware of recommendations he does not need to hold any blood thinners, nor need SBE. Call was dropped. I do believe the pt did hear all of the recommendations though. I will re-fax to dental office

## 2020-01-22 NOTE — Telephone Encounter (Signed)
Attempted to contact patient but there was no answer and VM did not pick up. 

## 2020-02-02 ENCOUNTER — Ambulatory Visit (INDEPENDENT_AMBULATORY_CARE_PROVIDER_SITE_OTHER): Payer: Medicare Other | Admitting: *Deleted

## 2020-02-02 DIAGNOSIS — I509 Heart failure, unspecified: Secondary | ICD-10-CM

## 2020-02-02 DIAGNOSIS — I480 Paroxysmal atrial fibrillation: Secondary | ICD-10-CM

## 2020-02-02 LAB — CUP PACEART REMOTE DEVICE CHECK
Battery Remaining Longevity: 66 mo
Battery Voltage: 3 V
Brady Statistic AP VP Percent: 0.17 %
Brady Statistic AP VS Percent: 96.5 %
Brady Statistic AS VP Percent: 0.03 %
Brady Statistic AS VS Percent: 3.3 %
Brady Statistic RA Percent Paced: 96.28 %
Brady Statistic RV Percent Paced: 0.26 %
Date Time Interrogation Session: 20210511114718
Implantable Lead Implant Date: 20070411
Implantable Lead Implant Date: 20070411
Implantable Lead Location: 753859
Implantable Lead Location: 753860
Implantable Lead Model: 5076
Implantable Lead Model: 5076
Implantable Pulse Generator Implant Date: 20170224
Lead Channel Impedance Value: 342 Ohm
Lead Channel Impedance Value: 342 Ohm
Lead Channel Impedance Value: 380 Ohm
Lead Channel Impedance Value: 475 Ohm
Lead Channel Pacing Threshold Amplitude: 0.875 V
Lead Channel Pacing Threshold Amplitude: 1.375 V
Lead Channel Pacing Threshold Pulse Width: 0.4 ms
Lead Channel Pacing Threshold Pulse Width: 0.4 ms
Lead Channel Sensing Intrinsic Amplitude: 0.5 mV
Lead Channel Sensing Intrinsic Amplitude: 0.5 mV
Lead Channel Sensing Intrinsic Amplitude: 5 mV
Lead Channel Sensing Intrinsic Amplitude: 5 mV
Lead Channel Setting Pacing Amplitude: 2 V
Lead Channel Setting Pacing Amplitude: 2.75 V
Lead Channel Setting Pacing Pulse Width: 0.4 ms
Lead Channel Setting Sensing Sensitivity: 0.9 mV

## 2020-02-03 NOTE — Progress Notes (Signed)
Remote pacemaker transmission.   

## 2020-02-26 ENCOUNTER — Other Ambulatory Visit: Payer: Self-pay | Admitting: Cardiovascular Disease

## 2020-02-26 MED ORDER — APIXABAN 5 MG PO TABS: 5.0000 mg | ORAL_TABLET | Freq: Two times a day (BID) | ORAL | 2 refills | Status: DC

## 2020-02-26 NOTE — Telephone Encounter (Signed)
Refill Request.  

## 2020-02-26 NOTE — Telephone Encounter (Signed)
Pt last saw Dr Graciela Husbands 12/22/19, last labs 12/29/19 Creat 0.80 at [facility] per care everywhere, age 84, weight 78.9kg, based on specified criteria pt is on appropriate dosage of Eliquis 5mg  BID.  Will refill rx.

## 2020-02-26 NOTE — Telephone Encounter (Signed)
*  STAT* If patient is at the pharmacy, call can be transferred to refill team.   1. Which medications need to be refilled? (please list name of each medication and dose if known) Eliquis 5 mg bid  2. Which pharmacy/location (including street and city if local pharmacy) is medication to be sent to? [facility]  3. Do they need a 30 day or 90 day supply? 90 (for 3 refills if possible)

## 2020-05-03 ENCOUNTER — Ambulatory Visit (INDEPENDENT_AMBULATORY_CARE_PROVIDER_SITE_OTHER): Payer: Medicare Other | Admitting: *Deleted

## 2020-05-03 DIAGNOSIS — I509 Heart failure, unspecified: Secondary | ICD-10-CM

## 2020-05-03 LAB — CUP PACEART REMOTE DEVICE CHECK
Battery Remaining Longevity: 66 mo
Battery Voltage: 3 V
Brady Statistic AP VP Percent: 0.61 %
Brady Statistic AP VS Percent: 96.21 %
Brady Statistic AS VP Percent: 0.14 %
Brady Statistic AS VS Percent: 3.04 %
Brady Statistic RA Percent Paced: 96.04 %
Brady Statistic RV Percent Paced: 0.98 %
Date Time Interrogation Session: 20210810063229
Implantable Lead Implant Date: 20070411
Implantable Lead Implant Date: 20070411
Implantable Lead Location: 753859
Implantable Lead Location: 753860
Implantable Lead Model: 5076
Implantable Lead Model: 5076
Implantable Pulse Generator Implant Date: 20170224
Lead Channel Impedance Value: 342 Ohm
Lead Channel Impedance Value: 342 Ohm
Lead Channel Impedance Value: 380 Ohm
Lead Channel Impedance Value: 456 Ohm
Lead Channel Pacing Threshold Amplitude: 0.75 V
Lead Channel Pacing Threshold Amplitude: 1.125 V
Lead Channel Pacing Threshold Pulse Width: 0.4 ms
Lead Channel Pacing Threshold Pulse Width: 0.4 ms
Lead Channel Sensing Intrinsic Amplitude: 0.625 mV
Lead Channel Sensing Intrinsic Amplitude: 0.625 mV
Lead Channel Sensing Intrinsic Amplitude: 4.875 mV
Lead Channel Sensing Intrinsic Amplitude: 4.875 mV
Lead Channel Setting Pacing Amplitude: 2 V
Lead Channel Setting Pacing Amplitude: 2.5 V
Lead Channel Setting Pacing Pulse Width: 0.4 ms
Lead Channel Setting Sensing Sensitivity: 0.9 mV

## 2020-05-05 NOTE — Progress Notes (Signed)
Remote pacemaker transmission.   

## 2020-06-20 NOTE — Progress Notes (Signed)
Cardiology Office Note  Date:  06/21/2020   ID:  Kyle Morales, DOB June 29, 1936, MRN [id number]  PCP:  Katrinka Blazing, MD   Chief Complaint  Patient presents with  . Follow-up    Follow up, patient c/o bilateral leg swelling. Medications verbally reviewed with patient.     HPI:  Kyle Morales is a  84 year old gentleman history of  paroxysmal atrial fibrillation dating back to 1992   coronary artery disease with stent placement at [facility] in 1997,  pacemaker placement in [place] in 2007 for diagnosis of heart block nonsustained VT,  chronic diarrhea Pacemaker change out done at [facility],  short run of nonsustained VT, 9 seconds Who presents for followup of his atrial fibrillation and coronary artery disease.  Last seen in clinic by Dr. Graciela Husbands March 2021 No new issues at that time, chronic edema [place] No major virus issues  UTI, went to [facility] treated with ABX, enterococcus Faecalis in the urine culture, labs reviewed He reports having kidney stones on u/s Catheter placed 2 weeks Unclear if he received IV fluids through this time Reports shortly after being seen in the hospital, he developed leg and ankle swelling -Symptoms have persisted over the past month, slowly getting better Recently bought some leg compressions/TED hose  Other issues include a inguinal hernia, hip discomfort  EKG personally reviewed by myself on todays visit Shows paced rhythm with rate 78 bpm  Other past medical history reviewed Echocardiogram March 14, 2017 Results discussed with him in detail Ejection fraction 50-55%, mildly dilated aortic root 39 mm  Left atrium severely dilated, mild to moderate MR   seen by Dr. Achilles Dunk and had cystoscopy, started on tamsulosin presumably for BPH Also seen by EP at [facility]. He was told that he is not having frequent episodes of atrial fibrillation Blood pressure has been stable, minimal orthostasis. Previously held losartan for low blood pressure  Previous history  of atrial fibrillation in the setting of GI distress and diarrhea   cultures were negative. Managed by GI     PMH:   has a past medical history of CHF (congestive heart failure) (HCC), Coronary artery disease, Eczema, Hyperlipidemia, Hypertension, Osteoarthrosis, Paroxysmal A-fib (HCC), Presence of permanent cardiac pacemaker, and S/P cardiac pacemaker procedure.  PSH:    Past Surgical History:  Procedure Laterality Date  . CARDIAC CATHETERIZATION  1997  . CARDIAC CATHETERIZATION  2000  . COLONOSCOPY  2012  . CORONARY ANGIOPLASTY  1997   ramus  . ESOPHAGOGASTRODUODENOSCOPY N/A 02/11/2017   Procedure: ESOPHAGOGASTRODUODENOSCOPY (EGD);  Surgeon: Christena Deem, MD;  Location: [facility] ENDOSCOPY;  Service: Endoscopy;  Laterality: N/A;  . HERNIA REPAIR    . INSERT / REPLACE / REMOVE PACEMAKER  12/2005   Medtronic [id number] - placed in [place]  . MANDIBLE SURGERY    . TONSILLECTOMY    . TOTAL SHOULDER REPLACEMENT     bilateral     Current Outpatient Medications  Medication Sig Dispense Refill  . acetaminophen (TYLENOL) 500 MG tablet Take 500 mg by mouth every 8 (eight) hours as needed.    Marland Kitchen apixaban (ELIQUIS) 5 MG TABS tablet Take 1 tablet (5 mg total) by mouth 2 (two) times daily. 180 tablet 2  . CVS ZINC 50 MG TABS Take 50 mg by mouth daily.    . finasteride (PROSCAR) 5 MG tablet Take 5 mg by mouth daily.    . fluticasone (FLONASE) 50 MCG/ACT nasal spray Place 2 sprays into both nostrils 2 (two) times daily.    Marland Kitchen  furosemide (LASIX) 40 MG tablet Take 120 mg by mouth daily.     Marland Kitchen ipratropium (ATROVENT HFA) 17 MCG/ACT inhaler Inhale 2 puffs into the lungs 4 (four) times daily. 1 Inhaler 0  . lovastatin (MEVACOR) 20 MG tablet Take 1 tablet (20 mg total) by mouth at bedtime. 90 tablet 3  . metolazone (ZAROXOLYN) 2.5 MG tablet Take 1 tablet (2.5 mg) by mouth twice a week for 1 week 5 tablet 0  . metoprolol succinate (TOPROL-XL) 50 MG 24 hr tablet Take 1 tablet (50 mg) by mouth twice  daily. Take with or immediately following a meal. 180 tablet 3  . nitroGLYCERIN (NITROSTAT) 0.4 MG SL tablet Place 1 tablet (0.4 mg total) under the tongue every 5 (five) minutes as needed for chest pain. 25 tablet 1  . potassium chloride (K-DUR) 10 MEQ tablet Take 20 mEq by mouth daily.     . RABEprazole (ACIPHEX) 20 MG tablet Take by mouth.    . tamsulosin (FLOMAX) 0.4 MG CAPS capsule Take 0.8 mg by mouth daily after breakfast.      No current facility-administered medications for this visit.     Allergies:   Amoxicillin   Social History:  The patient  reports that he has never smoked. He has never used smokeless tobacco. He reports that he does not drink alcohol and does not use drugs.   Family History:   family history includes Heart attack in his father; Heart disease in his brother and mother; Hyperlipidemia in his mother; Hypertension in his mother.    Review of Systems: Review of Systems  Constitutional: Negative.   HENT: Negative.   Respiratory: Negative.   Cardiovascular: Positive for leg swelling.  Gastrointestinal: Negative.   Musculoskeletal: Negative.        Leg weakness  Neurological: Negative.   Psychiatric/Behavioral: Negative.   All other systems reviewed and are negative.   PHYSICAL EXAM: VS:  BP 102/72 (BP Location: Left Arm, Patient Position: Sitting, Cuff Size: Normal)   Pulse 78   Ht 5\' 10"  (1.778 m)   Wt 168 lb (76.2 kg)   SpO2 98%   BMI 24.11 kg/m  , BMI Body mass index is 24.11 kg/m. Constitutional:  oriented to person, place, and time. No distress.  HENT:  Head: Grossly normal Eyes:  no discharge. No scleral icterus.  Neck: No JVD, no carotid bruits  Cardiovascular: Regular rate and rhythm, no murmurs appreciated Pulmonary/Chest: Clear to auscultation bilaterally, no wheezes or rails Abdominal: Soft.  no distension.  no tenderness.  Musculoskeletal: Normal range of motion Neurological:  normal muscle tone. Coordination normal. No  atrophy Skin: Skin warm and dry Psychiatric: normal affect, pleasant   Recent Labs: No results found for requested labs within last 8760 hours.    Lipid Panel Lab Results  Component Value Date   CHOL 136 02/20/2017   HDL 32 (L) 02/20/2017   LDLCALC 70 02/20/2017   TRIG 172 (H) 02/20/2017      Wt Readings from Last 3 Encounters:  06/21/20 168 lb (76.2 kg)  12/22/19 174 lb (78.9 kg)  05/18/19 174 lb 8 oz (79.2 kg)       ASSESSMENT AND PLAN:   Atrial fibrillation, unspecified type (HCC) - Plan: EKG 12-Lead Continues to have periodic atrial fibrillation, 0.9% longest 16 hours on recent download from pacer Tolerating Eliquis  5 mg twice a day Amiodarone previously held secondary to thyroid issues In general is asymptomatic apart from fluid retention  Nonsustained VT Lasting 15 beats,  34 episodes, Monitored by EP  Pure hypercholesterolemia On lovastatin, no changes made  Atherosclerosis of native coronary artery of native heart without angina pectoris Currently with no symptoms of angina. No further workup at this time. Continue current medication regimen.  Orthostatic hypotension Asymptomatic  Leg swelling Suspect a component of venous insufficiency Seem to get worse after being in the hospital for UTI, likely received IV fluids Has stayed on his Lasix now with compression hose, symptoms improving We have sent in metolazone to take very sparingly if symptoms get worse, he prefers not to take it at this time unless absolutely needed  Hypothyroidism Numbers back to normal, followed by primary care   Total encounter time more than 25 minutes  Greater than 50% was spent in counseling and coordination of care with the patient   Orders Placed This Encounter  Procedures  . EKG 12-Lead     Signed, Dossie Arbour, M.D., Ph.D. 06/21/2020  [facility] [place], [place] [phone]

## 2020-06-21 ENCOUNTER — Other Ambulatory Visit: Payer: Self-pay

## 2020-06-21 ENCOUNTER — Ambulatory Visit (INDEPENDENT_AMBULATORY_CARE_PROVIDER_SITE_OTHER): Payer: Medicare Other | Admitting: Cardiovascular Disease

## 2020-06-21 ENCOUNTER — Encounter: Payer: Self-pay | Admitting: Cardiovascular Disease

## 2020-06-21 VITALS — BP 102/72 | HR 78 | Ht 70.0 in | Wt 168.0 lb

## 2020-06-21 DIAGNOSIS — I5032 Chronic diastolic (congestive) heart failure: Secondary | ICD-10-CM

## 2020-06-21 DIAGNOSIS — I480 Paroxysmal atrial fibrillation: Secondary | ICD-10-CM | POA: Diagnosis not present

## 2020-06-21 DIAGNOSIS — I25118 Atherosclerotic heart disease of native coronary artery with other forms of angina pectoris: Secondary | ICD-10-CM

## 2020-06-21 DIAGNOSIS — I495 Sick sinus syndrome: Secondary | ICD-10-CM | POA: Diagnosis not present

## 2020-06-21 DIAGNOSIS — Z950 Presence of cardiac pacemaker: Secondary | ICD-10-CM

## 2020-06-21 DIAGNOSIS — I77819 Aortic ectasia, unspecified site: Secondary | ICD-10-CM

## 2020-06-21 MED ORDER — METOLAZONE 2.5 MG PO TABS: ORAL_TABLET | ORAL | 0 refills | Status: DC

## 2020-06-21 NOTE — Patient Instructions (Signed)
Medication Instructions:  Metolazone 2.5 mg daily as needed for moderate to severe leg swelling, Take sparingly  If you need a refill on your cardiac medications before your next appointment, please call your pharmacy.    Lab work: No new labs needed   If you have labs (blood work) drawn today and your tests are completely normal, you will receive your results only by: Marland Kitchen MyChart Message (if you have MyChart) OR . A paper copy in the mail If you have any lab test that is abnormal or we need to change your treatment, we will call you to review the results.   Testing/Procedures: No new testing needed   Follow-Up: At [facility], you and your health needs are our priority.  As part of our continuing mission to provide you with exceptional heart care, we have created designated Provider Care Teams.  These Care Teams include your primary Cardiologist (physician) and Advanced Practice Providers (APPs -  Physician Assistants and Nurse Practitioners) who all work together to provide you with the care you need, when you need it.  . You will need a follow up appointment in 6 months  . Providers on your designated Care Team:   . Nicolasa Ducking, NP . Eula Listen, PA-C . Marisue Ivan, PA-C  Any Other Special Instructions Will Be Listed Below (If Applicable).  COVID-19 Vaccine Information can be found at: PodExchange.nl For questions related to vaccine distribution or appointments, please email [email or web address] or call [phone].

## 2020-07-26 ENCOUNTER — Ambulatory Visit: Payer: Medicare Other | Admitting: Cardiovascular Disease

## 2020-08-02 ENCOUNTER — Ambulatory Visit (INDEPENDENT_AMBULATORY_CARE_PROVIDER_SITE_OTHER): Payer: Medicare Other

## 2020-08-02 DIAGNOSIS — I495 Sick sinus syndrome: Secondary | ICD-10-CM

## 2020-08-02 LAB — CUP PACEART REMOTE DEVICE CHECK
Battery Remaining Longevity: 58 mo
Battery Voltage: 2.99 V
Brady Statistic AP VP Percent: 0.15 %
Brady Statistic AP VS Percent: 95.68 %
Brady Statistic AS VP Percent: 0.1 %
Brady Statistic AS VS Percent: 4.08 %
Brady Statistic RA Percent Paced: 95.36 %
Brady Statistic RV Percent Paced: 0.27 %
Date Time Interrogation Session: 20211109082032
Implantable Lead Implant Date: 20070411
Implantable Lead Implant Date: 20070411
Implantable Lead Location: 753859
Implantable Lead Location: 753860
Implantable Lead Model: 5076
Implantable Lead Model: 5076
Implantable Pulse Generator Implant Date: 20170224
Lead Channel Impedance Value: 342 Ohm
Lead Channel Impedance Value: 342 Ohm
Lead Channel Impedance Value: 399 Ohm
Lead Channel Impedance Value: 456 Ohm
Lead Channel Pacing Threshold Amplitude: 0.75 V
Lead Channel Pacing Threshold Amplitude: 1 V
Lead Channel Pacing Threshold Pulse Width: 0.4 ms
Lead Channel Pacing Threshold Pulse Width: 0.4 ms
Lead Channel Sensing Intrinsic Amplitude: 1.875 mV
Lead Channel Sensing Intrinsic Amplitude: 1.875 mV
Lead Channel Sensing Intrinsic Amplitude: 5.125 mV
Lead Channel Sensing Intrinsic Amplitude: 5.125 mV
Lead Channel Setting Pacing Amplitude: 2 V
Lead Channel Setting Pacing Amplitude: 2.5 V
Lead Channel Setting Pacing Pulse Width: 0.4 ms
Lead Channel Setting Sensing Sensitivity: 0.9 mV

## 2020-08-04 NOTE — Progress Notes (Signed)
Remote pacemaker transmission.   

## 2020-09-09 ENCOUNTER — Telehealth: Payer: Self-pay | Admitting: Cardiovascular Disease

## 2020-09-09 NOTE — Telephone Encounter (Signed)
    [facility] Pre-operative Risk Assessment    HEARTCARE STAFF: - Please ensure there is not already an duplicate clearance open for this procedure. - Under Visit Info/Reason for Call, type in Other and utilize the format Clearance MM/DD/YY or Clearance TBD. Do not use dashes or single digits. - If request is for dental extraction, please clarify the # of teeth to be extracted.  Request for surgical clearance:  1. What type of surgery is being performed? Right Total Hip Replacement   2. When is this surgery scheduled? 10/07/20  3. What type of clearance is required (medical clearance vs. Pharmacy clearance to hold med vs. Both)? both  4. Are there any medications that need to be held prior to surgery and how long? Not listed, please advise if needed  5. Practice name and name of physician performing surgery? [facility] - Dr Merlene Morse  6. What is the office phone number? [phone]   7.   What is the office fax number? [phone]  8.   Anesthesia type (None, local, MAC, general) ? Spinal    Ace Gins 09/09/2020, 2:33 PM  _________________________________________________________________   (provider comments below)

## 2020-09-09 NOTE — Telephone Encounter (Signed)
Patient is a 84 year old male with past medical history of PAF, history of pacemaker placed in [place], nonsustained VT seen on previous device interrogation, orthostatic hypotension, hyperlipidemia, hypothyroidism and history of leg swelling related to venous insufficiency.  Last Myoview obtained on 06/29/2014 showed no significant ischemia, overall low risk study.  Echocardiogram obtained on 03/15/2015 showed EF 50 to 55%, mildly dilated aortic root measuring at 39 mm, mild to moderate MR, severe LAE.  He was last seen by Dr. Mariah Milling on 06/21/2020 at which time he was doing well without any chest pain or shortness of breath.  Most recent device interrogation obtained on 08/20/2020 showed A. fib burden 1.4%, longest episode was 7 hours, 33 showed NSVT and some atrial ectopy, otherwise normal device function.  Dr. Mariah Milling, I spoke with Mr. Iannone, he continued to deny any recent chest pain worsening shortness of breath.  He thinks he can walk 2 blocks away from his home and back if he walks slowly.  Despite hip issue, he climb 14 steps of stairs at home 2-3 times per day without any issue.  From your perspective, it is okay for him to proceed with surgery?  Please forward to your response to P [facility] Preop

## 2020-09-09 NOTE — Telephone Encounter (Signed)
Clinical pharmacist to review Eliquis 

## 2020-09-09 NOTE — Telephone Encounter (Signed)
Patient with diagnosis of afib on Eliquis for anticoagulation.    Procedure: right THA Date of procedure: 10/07/20  CHA2DS2-VASc Score = 5  This indicates a 7.2% annual risk of stroke. The patient's score is based upon: CHF History: Yes HTN History: Yes Diabetes History: No Stroke History: No Vascular Disease History: Yes Age Score: 2 Gender Score: 0   CrCl 59mL/min Platelet count 192K  Per office protocol, patient can hold Eliquis for 3 days prior to procedure.

## 2020-09-13 NOTE — Telephone Encounter (Signed)
Ok to proceed with surgery, acceptable risk, Hold eliquis per pharmacy

## 2020-09-13 NOTE — Telephone Encounter (Signed)
   Primary Cardiologist: Julien Nordmann, MD  Chart reviewed as part of pre-operative protocol coverage. Given past medical history and time since last visit, based on ACC/AHA guidelines, WASH NIENHAUS would be at acceptable risk for the planned procedure without further cardiovascular testing.   Patient with diagnosis of afib on Eliquis for anticoagulation.    Procedure: right THA Date of procedure: 10/07/20  CHA2DS2-VASc Score = 5  This indicates a 7.2% annual risk of stroke. The patient's score is based upon: CHF History: Yes HTN History: Yes Diabetes History: No Stroke History: No Vascular Disease History: Yes Age Score: 2 Gender Score: 0   CrCl 36mL/min Platelet count 192K  Per office protocol, patient can hold Eliquis for 3 days prior to procedure.   I will route this recommendation to the requesting party via Epic fax function and remove from pre-op pool.  Please call with questions.  Thomasene Ripple.  NP-C    09/13/2020, 9:18 AM [facility] [address] Office [phone] Fax [phone]

## 2020-09-24 HISTORY — PX: TOTAL HIP ARTHROPLASTY: SHX124

## 2020-10-12 ENCOUNTER — Telehealth: Payer: Self-pay | Admitting: Cardiovascular Disease

## 2020-10-12 MED ORDER — APIXABAN 5 MG PO TABS: 5.0000 mg | ORAL_TABLET | Freq: Two times a day (BID) | ORAL | 1 refills | Status: DC

## 2020-10-12 NOTE — Telephone Encounter (Signed)
Refill request

## 2020-10-12 NOTE — Telephone Encounter (Signed)
*  STAT* If patient is at the pharmacy, call can be transferred to refill team.   1. Which medications need to be refilled? (please list name of each medication and dose if known) Eliquis  2. Which pharmacy/location (including street and city if local pharmacy) is medication to be sent to? [facility]  3. Do they need a 30 day or 90 day supply? 90 day

## 2020-10-12 NOTE — Telephone Encounter (Signed)
Pt's age 85, wt 76.2 kg, SCr 0.89, CrCl 68.97, last ov w/ TG 06/21/20. Eliquis refill sent in as requested.

## 2020-11-01 ENCOUNTER — Ambulatory Visit (INDEPENDENT_AMBULATORY_CARE_PROVIDER_SITE_OTHER): Payer: Medicare Other

## 2020-11-01 DIAGNOSIS — I495 Sick sinus syndrome: Secondary | ICD-10-CM

## 2020-11-01 LAB — CUP PACEART REMOTE DEVICE CHECK
Battery Remaining Longevity: 59 mo
Battery Voltage: 2.99 V
Brady Statistic AP VP Percent: 0.23 %
Brady Statistic AP VS Percent: 95.22 %
Brady Statistic AS VP Percent: 0.09 %
Brady Statistic AS VS Percent: 4.46 %
Brady Statistic RA Percent Paced: 95.02 %
Brady Statistic RV Percent Paced: 0.38 %
Date Time Interrogation Session: 20220208093916
Implantable Lead Implant Date: 20070411
Implantable Lead Implant Date: 20070411
Implantable Lead Location: 753859
Implantable Lead Location: 753860
Implantable Lead Model: 5076
Implantable Lead Model: 5076
Implantable Pulse Generator Implant Date: 20170224
Lead Channel Impedance Value: 342 Ohm
Lead Channel Impedance Value: 342 Ohm
Lead Channel Impedance Value: 399 Ohm
Lead Channel Impedance Value: 456 Ohm
Lead Channel Pacing Threshold Amplitude: 0.625 V
Lead Channel Pacing Threshold Amplitude: 1.125 V
Lead Channel Pacing Threshold Pulse Width: 0.4 ms
Lead Channel Pacing Threshold Pulse Width: 0.4 ms
Lead Channel Sensing Intrinsic Amplitude: 1.625 mV
Lead Channel Sensing Intrinsic Amplitude: 1.625 mV
Lead Channel Sensing Intrinsic Amplitude: 5 mV
Lead Channel Sensing Intrinsic Amplitude: 5 mV
Lead Channel Setting Pacing Amplitude: 2 V
Lead Channel Setting Pacing Amplitude: 2.75 V
Lead Channel Setting Pacing Pulse Width: 0.4 ms
Lead Channel Setting Sensing Sensitivity: 0.9 mV

## 2020-11-07 NOTE — Progress Notes (Signed)
Remote pacemaker transmission.   

## 2020-12-05 NOTE — Progress Notes (Signed)
Cardiology Office Note  Date:  12/06/2020   ID:  Kyle Morales, DOB November 02, 1935, MRN [id number]  PCP:  Katrinka Blazing, MD   Chief Complaint  Patient presents with  . Follow-up    6 Months follow up. Per patient he is currently taking all the medications on the medication list given to him at check in.     HPI:  Mr. Sleight is a  85 year old gentleman history of  paroxysmal atrial fibrillation dating back to 1992   coronary artery disease with stent placement at [facility] in 1997,  pacemaker placement in [place] in 2007 for diagnosis of heart block chronic diarrhea Pacemaker change out done at [facility],  short run of nonsustained VT, 9 seconds Who presents for followup of his atrial fibrillation and coronary artery disease.  Scheduled for hip surgery 12/20/20 Right hip  No edema Urinary troubles, kidney stone "attack" sept 2021 Bladder infection Followed by urology On low dose ABX  Does not feel atrial fibrillation Last pacer download reviewed AF burden 0.5%, longest 3 hrs, rates controlled. 38 VHR with 2 VT-Mon episodes, longest (short NSVT and some atrial). Longest NSVT, 20 beats on 10/25/20.  [place] No major virus issues  Prior history of UTI, went to [facility] treated with ABX, enterococcus Faecalis in the urine culture  EKG personally reviewed by myself on todays visit Shows paced rhythm with rate 74 bpm, pvc  Other past medical history reviewed Echocardiogram March 14, 2017 Results discussed with him in detail Ejection fraction 50-55%, mildly dilated aortic root 39 mm  Left atrium severely dilated, mild to moderate MR  Previous history of atrial fibrillation in the setting of GI distress and diarrhea   cultures were negative. Managed by GI   PMH:   has a past medical history of CHF (congestive heart failure) (HCC), Coronary artery disease, Eczema, Hyperlipidemia, Hypertension, Osteoarthrosis, Paroxysmal A-fib (HCC), Presence of permanent cardiac pacemaker, and S/P  cardiac pacemaker procedure.  PSH:    Past Surgical History:  Procedure Laterality Date  . CARDIAC CATHETERIZATION  1997  . CARDIAC CATHETERIZATION  2000  . COLONOSCOPY  2012  . CORONARY ANGIOPLASTY  1997   ramus  . ESOPHAGOGASTRODUODENOSCOPY N/A 02/11/2017   Procedure: ESOPHAGOGASTRODUODENOSCOPY (EGD);  Surgeon: Christena Deem, MD;  Location: [facility] ENDOSCOPY;  Service: Endoscopy;  Laterality: N/A;  . HERNIA REPAIR    . INSERT / REPLACE / REMOVE PACEMAKER  12/2005   Medtronic [id number] H - placed in [place]  . MANDIBLE SURGERY    . TONSILLECTOMY    . TOTAL SHOULDER REPLACEMENT     bilateral     Current Outpatient Medications  Medication Sig Dispense Refill  . acetaminophen (TYLENOL) 500 MG tablet Take 500 mg by mouth every 8 (eight) hours as needed.    Marland Kitchen acetaZOLAMIDE (DIAMOX) 500 MG capsule     . apixaban (ELIQUIS) 5 MG TABS tablet Take 1 tablet (5 mg total) by mouth 2 (two) times daily. 180 tablet 1  . CVS ZINC 50 MG TABS Take 50 mg by mouth daily.    . finasteride (PROSCAR) 5 MG tablet Take 5 mg by mouth daily.    . fluticasone (FLONASE) 50 MCG/ACT nasal spray Place 2 sprays into both nostrils 2 (two) times daily.    . furosemide (LASIX) 40 MG tablet Take 120 mg by mouth daily.     Marland Kitchen ipratropium (ATROVENT HFA) 17 MCG/ACT inhaler Inhale 2 puffs into the lungs 4 (four) times daily. 1 Inhaler 0  . ketorolac (ACULAR)  0.5 % ophthalmic solution     . metolazone (ZAROXOLYN) 2.5 MG tablet Take 1 tablet (2.5 mg) daily PRN for leg swelling 10 tablet 0  . nitroGLYCERIN (NITROSTAT) 0.4 MG SL tablet Place 1 tablet (0.4 mg total) under the tongue every 5 (five) minutes as needed for chest pain. 25 tablet 1  . oxyCODONE (OXY IR/ROXICODONE) 5 MG immediate release tablet Take 5 mg by mouth 2 (two) times daily as needed.    . potassium chloride (K-DUR) 10 MEQ tablet Take 20 mEq by mouth daily.     . RABEprazole (ACIPHEX) 20 MG tablet Take by mouth.    . tamsulosin (FLOMAX) 0.4 MG CAPS capsule  Take 0.8 mg by mouth daily after breakfast.     . lovastatin (MEVACOR) 20 MG tablet Take 1 tablet (20 mg total) by mouth at bedtime. 90 tablet 3  . metoprolol succinate (TOPROL-XL) 50 MG 24 hr tablet Take 1 tablet (50 mg) by mouth twice daily. Take with or immediately following a meal. 180 tablet 3   No current facility-administered medications for this visit.    Allergies:   Amoxicillin   Social History:  The patient  reports that he has never smoked. He has never used smokeless tobacco. He reports that he does not drink alcohol and does not use drugs.   Family History:   family history includes Heart attack in his father; Heart disease in his brother and mother; Hyperlipidemia in his mother; Hypertension in his mother.    Review of Systems: Review of Systems  Constitutional: Negative.   HENT: Negative.   Respiratory: Negative.   Cardiovascular: Positive for leg swelling.  Gastrointestinal: Negative.   Musculoskeletal: Negative.        Leg weakness  Neurological: Negative.   Psychiatric/Behavioral: Negative.   All other systems reviewed and are negative.   PHYSICAL EXAM: VS:  BP 112/74 (BP Location: Left Arm, Patient Position: Sitting, Cuff Size: Normal)   Pulse 74   Ht 5\' 10"  (1.778 m)   Wt 171 lb (77.6 kg)   SpO2 97%   BMI 24.54 kg/m  , BMI Body mass index is 24.54 kg/m. Constitutional:  oriented to person, place, and time. No distress.  HENT:  Head: Grossly normal Eyes:  no discharge. No scleral icterus.  Neck: No JVD, no carotid bruits  Cardiovascular: Regular rate and rhythm, no murmurs appreciated Pulmonary/Chest: Clear to auscultation bilaterally, no wheezes or rails Abdominal: Soft.  no distension.  no tenderness.  Musculoskeletal: Normal range of motion Neurological:  normal muscle tone. Coordination normal. No atrophy Skin: Skin warm and dry Psychiatric: normal affect, pleasant   Recent Labs: No results found for requested labs within last 8760 hours.     Lipid Panel Lab Results  Component Value Date   CHOL 136 02/20/2017   HDL 32 (L) 02/20/2017   LDLCALC 70 02/20/2017   TRIG 172 (H) 02/20/2017      Wt Readings from Last 3 Encounters:  12/06/20 171 lb (77.6 kg)  06/21/20 168 lb (76.2 kg)  12/22/19 174 lb (78.9 kg)     ASSESSMENT AND PLAN:  preop cardiovascular hip surgery Surgery on march 29th Would suggest 3 moderate IV fluids perioperatively, given prior issues with CHF Acceptable risk , no further testing No eliquis on March 27, 28, 29  Atrial fibrillation, unspecified type (HCC) - Plan: EKG 12-Lead Continues to have periodic atrial fibrillation, 0.5%  longest 3 hours on recent download from pacer Tolerating Eliquis  5 mg twice a day  Amiodarone previously held secondary to thyroid issues  Nonsustained VT Rare epsiodes Monitored by EP  Pure hypercholesterolemia On lovastatin, no changes made  Atherosclerosis of native coronary artery of native heart without angina pectoris Currently with no symptoms of angina. No further workup at this time. Continue current medication regimen.  Orthostatic hypotension Asymptomatic stable  Leg swelling Stable, none today Suspect a component of venous insufficiency Hx of CHF  Hypothyroidism Numbers back to normal, followed by primary care   Total encounter time more than 25 minutes  Greater than 50% was spent in counseling and coordination of care with the patient   Orders Placed This Encounter  Procedures  . EKG 12-Lead     Signed, Dossie Arbour, M.D., Ph.D. 12/06/2020  [facility] [place], [place] [phone]

## 2020-12-06 ENCOUNTER — Ambulatory Visit (INDEPENDENT_AMBULATORY_CARE_PROVIDER_SITE_OTHER): Payer: Medicare Other | Admitting: Cardiovascular Disease

## 2020-12-06 ENCOUNTER — Encounter: Payer: Self-pay | Admitting: Cardiovascular Disease

## 2020-12-06 ENCOUNTER — Other Ambulatory Visit: Payer: Self-pay

## 2020-12-06 VITALS — BP 112/74 | HR 74 | Ht 70.0 in | Wt 171.0 lb

## 2020-12-06 DIAGNOSIS — I77819 Aortic ectasia, unspecified site: Secondary | ICD-10-CM

## 2020-12-06 DIAGNOSIS — Z950 Presence of cardiac pacemaker: Secondary | ICD-10-CM | POA: Diagnosis not present

## 2020-12-06 DIAGNOSIS — I480 Paroxysmal atrial fibrillation: Secondary | ICD-10-CM

## 2020-12-06 DIAGNOSIS — I495 Sick sinus syndrome: Secondary | ICD-10-CM | POA: Diagnosis not present

## 2020-12-06 DIAGNOSIS — I951 Orthostatic hypotension: Secondary | ICD-10-CM

## 2020-12-06 DIAGNOSIS — I5032 Chronic diastolic (congestive) heart failure: Secondary | ICD-10-CM

## 2020-12-06 DIAGNOSIS — I25118 Atherosclerotic heart disease of native coronary artery with other forms of angina pectoris: Secondary | ICD-10-CM | POA: Diagnosis not present

## 2020-12-06 DIAGNOSIS — I509 Heart failure, unspecified: Secondary | ICD-10-CM

## 2020-12-06 MED ORDER — METOPROLOL SUCCINATE ER 50 MG PO TB24: ORAL_TABLET | ORAL | 3 refills | Status: DC

## 2020-12-06 MED ORDER — LOVASTATIN 20 MG PO TABS: 20.0000 mg | ORAL_TABLET | Freq: Every day | ORAL | 3 refills | Status: DC

## 2020-12-06 NOTE — Patient Instructions (Signed)
No eliquis on March 27, 28, 29 Ask surgery when it is ok to restart eliquis after surgery   Medication Instructions:  No changes  If you need a refill on your cardiac medications before your next appointment, please call your pharmacy.    Lab work: No new labs needed   If you have labs (blood work) drawn today and your tests are completely normal, you will receive your results only by: Marland Kitchen MyChart Message (if you have MyChart) OR . A paper copy in the mail If you have any lab test that is abnormal or we need to change your treatment, we will call you to review the results.   Testing/Procedures: No new testing needed   Follow-Up: At [facility], you and your health needs are our priority.  As part of our continuing mission to provide you with exceptional heart care, we have created designated Provider Care Teams.  These Care Teams include your primary Cardiologist (physician) and Advanced Practice Providers (APPs -  Physician Assistants and Nurse Practitioners) who all work together to provide you with the care you need, when you need it.  . You will need a follow up appointment in 12 months  . Providers on your designated Care Team:   . Nicolasa Ducking, NP . Eula Listen, PA-C . Marisue Ivan, PA-C  Any Other Special Instructions Will Be Listed Below (If Applicable).  COVID-19 Vaccine Information can be found at: [email or web address].nl For questions related to vaccine distribution or appointments, please email [email or web address] or call [phone].

## 2020-12-27 DIAGNOSIS — I480 Paroxysmal atrial fibrillation: Secondary | ICD-10-CM

## 2020-12-27 DIAGNOSIS — I442 Atrioventricular block, complete: Secondary | ICD-10-CM

## 2020-12-27 DIAGNOSIS — R339 Retention of urine, unspecified: Secondary | ICD-10-CM

## 2020-12-27 DIAGNOSIS — I472 Ventricular tachycardia: Secondary | ICD-10-CM | POA: Diagnosis not present

## 2020-12-27 DIAGNOSIS — I5032 Chronic diastolic (congestive) heart failure: Secondary | ICD-10-CM

## 2020-12-27 DIAGNOSIS — M1611 Unilateral primary osteoarthritis, right hip: Secondary | ICD-10-CM

## 2020-12-27 DIAGNOSIS — N400 Enlarged prostate without lower urinary tract symptoms: Secondary | ICD-10-CM

## 2021-01-31 ENCOUNTER — Ambulatory Visit (INDEPENDENT_AMBULATORY_CARE_PROVIDER_SITE_OTHER): Payer: Medicare Other

## 2021-01-31 ENCOUNTER — Other Ambulatory Visit: Payer: Self-pay

## 2021-01-31 ENCOUNTER — Ambulatory Visit (INDEPENDENT_AMBULATORY_CARE_PROVIDER_SITE_OTHER): Payer: Medicare Other | Admitting: Cardiovascular Disease

## 2021-01-31 ENCOUNTER — Encounter: Payer: Self-pay | Admitting: Cardiovascular Disease

## 2021-01-31 VITALS — BP 118/70 | HR 78 | Ht 71.0 in | Wt 166.4 lb

## 2021-01-31 DIAGNOSIS — I5032 Chronic diastolic (congestive) heart failure: Secondary | ICD-10-CM

## 2021-01-31 DIAGNOSIS — I495 Sick sinus syndrome: Secondary | ICD-10-CM

## 2021-01-31 DIAGNOSIS — I10 Essential (primary) hypertension: Secondary | ICD-10-CM

## 2021-01-31 DIAGNOSIS — I480 Paroxysmal atrial fibrillation: Secondary | ICD-10-CM | POA: Diagnosis not present

## 2021-01-31 DIAGNOSIS — E782 Mixed hyperlipidemia: Secondary | ICD-10-CM | POA: Diagnosis not present

## 2021-01-31 DIAGNOSIS — I25118 Atherosclerotic heart disease of native coronary artery with other forms of angina pectoris: Secondary | ICD-10-CM

## 2021-01-31 LAB — CUP PACEART REMOTE DEVICE CHECK
Battery Remaining Longevity: 50 mo
Battery Voltage: 2.99 V
Brady Statistic AP VP Percent: 0.06 %
Brady Statistic AP VS Percent: 76.18 %
Brady Statistic AS VP Percent: 0.49 %
Brady Statistic AS VS Percent: 23.26 %
Brady Statistic RA Percent Paced: 76.05 %
Brady Statistic RV Percent Paced: 0.58 %
Date Time Interrogation Session: 20220510071300
Implantable Lead Implant Date: 20070411
Implantable Lead Implant Date: 20070411
Implantable Lead Location: 753859
Implantable Lead Location: 753860
Implantable Lead Model: 5076
Implantable Lead Model: 5076
Implantable Pulse Generator Implant Date: 20170224
Lead Channel Impedance Value: 342 Ohm
Lead Channel Impedance Value: 342 Ohm
Lead Channel Impedance Value: 380 Ohm
Lead Channel Impedance Value: 456 Ohm
Lead Channel Pacing Threshold Amplitude: 0.75 V
Lead Channel Pacing Threshold Amplitude: 1.25 V
Lead Channel Pacing Threshold Pulse Width: 0.4 ms
Lead Channel Pacing Threshold Pulse Width: 0.4 ms
Lead Channel Sensing Intrinsic Amplitude: 1.375 mV
Lead Channel Sensing Intrinsic Amplitude: 1.375 mV
Lead Channel Sensing Intrinsic Amplitude: 4.75 mV
Lead Channel Sensing Intrinsic Amplitude: 4.75 mV
Lead Channel Setting Pacing Amplitude: 2 V
Lead Channel Setting Pacing Amplitude: 2.75 V
Lead Channel Setting Pacing Pulse Width: 0.4 ms
Lead Channel Setting Sensing Sensitivity: 0.9 mV

## 2021-01-31 MED ORDER — METOPROLOL TARTRATE 25 MG PO TABS: 25.0000 mg | ORAL_TABLET | Freq: Two times a day (BID) | ORAL | 3 refills | Status: DC | PRN

## 2021-01-31 NOTE — Patient Instructions (Addendum)
Medication Instructions:  Metoprolol tartrate 25 mg BID PRN  As needed for HR > 100  Palpitations   If you need a refill on your cardiac medications before your next appointment, please call your pharmacy.    Lab work: No new labs needed   If you have labs (blood work) drawn today and your tests are completely normal, you will receive your results only by: Marland Kitchen MyChart Message (if you have MyChart) OR . A paper copy in the mail If you have any lab test that is abnormal or we need to change your treatment, we will call you to review the results.   Testing/Procedures: No new testing needed   Follow-Up: At [facility], you and your health needs are our priority.  As part of our continuing mission to provide you with exceptional heart care, we have created designated Provider Care Teams.  These Care Teams include your primary Cardiologist (physician) and Advanced Practice Providers (APPs -  Physician Assistants and Nurse Practitioners) who all work together to provide you with the care you need, when you need it.  . You will need a follow up appointment in 6 months  . Providers on your designated Care Team:   . Nicolasa Ducking, NP . Eula Listen, PA-C . Marisue Ivan, PA-C  Any Other Special Instructions Will Be Listed Below (If Applicable).  COVID-19 Vaccine Information can be found at: [email or web address] For questions related to vaccine distribution or appointments, please email [email or web address] or call [phone].

## 2021-01-31 NOTE — Progress Notes (Signed)
Cardiology Office Note  Date:  01/31/2021   ID:  Rydell, Wiegel 1935/12/19, MRN [id number]  PCP:  Katrinka Blazing, MD   Chief Complaint  Patient presents with  . Follow-up    atrial-Tach- found during hip surgery. Patient c/o shortness of breath. Medications reviewed by the patient verbally.     HPI:  Mr. Bernasconi is a  85 year old gentleman history of  paroxysmal atrial fibrillation dating back to 1992   coronary artery disease with stent placement at [facility] in 1997,  pacemaker placement in [place] in 2007 for diagnosis of heart block chronic diarrhea Pacemaker change out done at [facility],  short run of nonsustained VT, 9 seconds Who presents for followup of his atrial fibrillation and coronary artery disease.   hip surgery 12/20/20 at [facility] Notes reviewed, EKGs indicating he developed accelerated junctional rhythm  ECG: 12/23/20 atrial tach, RBBB, single V paced complex Cardiology was consulted at [facility], they reported he had atrial tachycardia Initially placed on diltiazem and metoprolol, metoprolol was held for low blood pressure  Echo at [facility] 2. Echo contrast utilized to enhance endocardial border definition.  3. The left ventricular systolic function is normal, LVEF is visually  estimated at 55-60%.  4. The right ventricle is not well visualized but probably normal in size,  with normal systolicfunction.   Went to [facility] 10 days for PT  Now back on eliquis, Back on metoprolol 50 daily Did not take the diltiazem more than 9 days following discharge Developed leg swelling following hospital discharge, missed several doses of his Lasix Now back on lasix 120 daily, edema improved  Did pacer download today AF burden 9.9%, longest 39hrs. Rates 100-120bpm High burden for him, possibly exacerbated by recent stressors from hip surgery Typically runs around 1% burden  Asymptomatic from his atrial fibrillation  EKG personally reviewed by myself on todays visit Shows paced  rhythm rate 78 bpm  Other past medical history reviewed Prior history of UTI, went to [facility] treated with ABX, enterococcus Faecalis in the urine culture  Other past medical history reviewed Echocardiogram March 14, 2017 Results discussed with him in detail Ejection fraction 50-55%, mildly dilated aortic root 39 mm  Left atrium severely dilated, mild to moderate MR  Previous history of atrial fibrillation in the setting of GI distress and diarrhea   cultures were negative. Managed by GI   PMH:   has a past medical history of CHF (congestive heart failure) (HCC), Coronary artery disease, Eczema, Hyperlipidemia, Hypertension, Osteoarthrosis, Paroxysmal A-fib (HCC), Presence of permanent cardiac pacemaker, and S/P cardiac pacemaker procedure.  PSH:    Past Surgical History:  Procedure Laterality Date  . CARDIAC CATHETERIZATION  1997  . CARDIAC CATHETERIZATION  2000  . COLONOSCOPY  2012  . CORONARY ANGIOPLASTY  1997   ramus  . ESOPHAGOGASTRODUODENOSCOPY N/A 02/11/2017   Procedure: ESOPHAGOGASTRODUODENOSCOPY (EGD);  Surgeon: Christena Deem, MD;  Location: [facility] ENDOSCOPY;  Service: Endoscopy;  Laterality: N/A;  . HERNIA REPAIR    . INSERT / REPLACE / REMOVE PACEMAKER  12/2005   Medtronic [id number] H - placed in [place]  . MANDIBLE SURGERY    . TONSILLECTOMY    . TOTAL SHOULDER REPLACEMENT     bilateral     Current Outpatient Medications  Medication Sig Dispense Refill  . acetaminophen (TYLENOL) 500 MG tablet Take 500 mg by mouth every 8 (eight) hours as needed.    Marland Kitchen acetaZOLAMIDE (DIAMOX) 500 MG capsule     . apixaban (ELIQUIS) 5 MG  TABS tablet Take 1 tablet (5 mg total) by mouth 2 (two) times daily. 180 tablet 1  . Cholecalciferol 125 MCG (5000 UT) TABS Take 1 tablet by mouth daily.    . CVS ZINC 50 MG TABS Take 50 mg by mouth daily.    . finasteride (PROSCAR) 5 MG tablet Take 5 mg by mouth daily.    . fluticasone (FLONASE) 50 MCG/ACT nasal spray Place 2 sprays into both  nostrils 2 (two) times daily.    . furosemide (LASIX) 40 MG tablet Take 120 mg by mouth daily.     Marland Kitchen ipratropium (ATROVENT HFA) 17 MCG/ACT inhaler Inhale 2 puffs into the lungs 4 (four) times daily. 1 Inhaler 0  . lovastatin (MEVACOR) 20 MG tablet Take 1 tablet (20 mg total) by mouth at bedtime. 90 tablet 3  . metoprolol succinate (TOPROL-XL) 50 MG 24 hr tablet Take 1 tablet (50 mg) by mouth twice daily. Take with or immediately following a meal. 180 tablet 3  . nitrofurantoin, macrocrystal-monohydrate, (MACROBID) 100 MG capsule Take 100 mg by mouth daily.    . nitroGLYCERIN (NITROSTAT) 0.4 MG SL tablet Place 1 tablet (0.4 mg total) under the tongue every 5 (five) minutes as needed for chest pain. 25 tablet 1  . omeprazole (PRILOSEC) 20 MG capsule Take 20 mg by mouth daily.    . potassium chloride SA (KLOR-CON) 20 MEQ tablet Take 20 mEq by mouth daily.    . RABEprazole (ACIPHEX) 20 MG tablet Take by mouth.    . tamsulosin (FLOMAX) 0.4 MG CAPS capsule Take 0.8 mg by mouth daily after breakfast.     . oxyCODONE (OXY IR/ROXICODONE) 5 MG immediate release tablet Take 5 mg by mouth 2 (two) times daily as needed. (Patient not taking: Reported on 01/31/2021)     No current facility-administered medications for this visit.    Allergies:   Amoxicillin   Social History:  The patient  reports that he has never smoked. He has never used smokeless tobacco. He reports that he does not drink alcohol and does not use drugs.   Family History:   family history includes Heart attack in his father; Heart disease in his brother and mother; Hyperlipidemia in his mother; Hypertension in his mother.    Review of Systems: Review of Systems  Constitutional: Negative.   HENT: Negative.   Respiratory: Negative.   Cardiovascular: Positive for leg swelling.  Gastrointestinal: Negative.   Musculoskeletal: Negative.        Leg weakness  Neurological: Negative.   Psychiatric/Behavioral: Negative.   All other systems  reviewed and are negative.   PHYSICAL EXAM: VS:  BP 118/70 (BP Location: Left Arm, Patient Position: Sitting, Cuff Size: Normal)   Pulse 78   Ht 5\' 11"  (1.803 m)   Wt 166 lb 6 oz (75.5 kg)   SpO2 95%   BMI 23.20 kg/m  , BMI Body mass index is 23.2 kg/m. Constitutional:  oriented to person, place, and time. No distress.  HENT:  Head: Grossly normal Eyes:  no discharge. No scleral icterus.  Neck: No JVD, no carotid bruits  Cardiovascular: Regular rate and rhythm, no murmurs appreciated Pulmonary/Chest: Clear to auscultation bilaterally, no wheezes or rails Abdominal: Soft.  no distension.  no tenderness.  Musculoskeletal: Normal range of motion Neurological:  normal muscle tone. Coordination normal. No atrophy Skin: Skin warm and dry Psychiatric: normal affect, pleasant   Recent Labs: No results found for requested labs within last 8760 hours.    Lipid Panel  Lab Results  Component Value Date   CHOL 136 02/20/2017   HDL 32 (L) 02/20/2017   LDLCALC 70 02/20/2017   TRIG 172 (H) 02/20/2017      Wt Readings from Last 3 Encounters:  01/31/21 166 lb 6 oz (75.5 kg)  12/06/20 171 lb (77.6 kg)  06/21/20 168 lb (76.2 kg)     ASSESSMENT AND PLAN:  Atrial fibrillation, unspecified type (HCC) - Higher burden atrial fibrillation on recent download up to 9%   possibly exacerbated from recent stressors of hip surgery at [facility] where he was seen by [facility] cardiology, They report atrial tachycardia in consult, EKG final read of atrial flutter Now maintaining normal sinus rhythm, asymptomatic, back on his metoprolol succinate 50 daily and not taking diltiazem secondary to low blood pressure Amiodarone previously held secondary to thyroid issues Recommend he closely monitor heart rate at home, consider metoprolol tartrate 25 mg as needed for heart rate 100 or higher Has close follow-up with Dr. Graciela Husbands EP  Nonsustained VT Monitored by EP  Pure hypercholesterolemia On lovastatin, no  changes made  Atherosclerosis of native coronary artery of native heart  Currently with no symptoms of angina. No further workup at this time. Continue current medication regimen.  Orthostatic hypotension Asymptomatic Continue metoprolol  Leg swelling Trace swelling on the right, minimal on the left Suspect a component of venous insufficiency Does not tolerate being off his diuretics or lower dose, prefers to take 120 mg daily  Hypothyroidism Followed by primary care   Total encounter time more than 35 minutes  Greater than 50% was spent in counseling and coordination of care with the patient   No orders of the defined types were placed in this encounter.    Signed, Dossie Arbour, M.D., Ph.D. 01/31/2021  [facility] [place], [place] [phone]

## 2021-02-23 NOTE — Progress Notes (Signed)
Remote pacemaker transmission.   

## 2021-03-14 ENCOUNTER — Other Ambulatory Visit: Payer: Self-pay

## 2021-03-14 ENCOUNTER — Encounter: Payer: Self-pay | Admitting: Internal Medicine

## 2021-03-14 ENCOUNTER — Ambulatory Visit (INDEPENDENT_AMBULATORY_CARE_PROVIDER_SITE_OTHER): Payer: Medicare Other | Admitting: Internal Medicine

## 2021-03-14 VITALS — BP 120/72 | HR 72 | Ht 71.0 in | Wt 171.0 lb

## 2021-03-14 DIAGNOSIS — Z950 Presence of cardiac pacemaker: Secondary | ICD-10-CM

## 2021-03-14 DIAGNOSIS — I495 Sick sinus syndrome: Secondary | ICD-10-CM | POA: Diagnosis not present

## 2021-03-14 DIAGNOSIS — I10 Essential (primary) hypertension: Secondary | ICD-10-CM

## 2021-03-14 DIAGNOSIS — I5032 Chronic diastolic (congestive) heart failure: Secondary | ICD-10-CM

## 2021-03-14 DIAGNOSIS — R5383 Other fatigue: Secondary | ICD-10-CM

## 2021-03-14 DIAGNOSIS — I25118 Atherosclerotic heart disease of native coronary artery with other forms of angina pectoris: Secondary | ICD-10-CM | POA: Diagnosis not present

## 2021-03-14 DIAGNOSIS — I480 Paroxysmal atrial fibrillation: Secondary | ICD-10-CM

## 2021-03-14 NOTE — Patient Instructions (Signed)
Medication Instructions:  - Your physician recommends that you continue on your current medications as directed. Please refer to the Current Medication list given to you today.  *If you need a refill on your cardiac medications before your next appointment, please call your pharmacy*   Lab Work: - Your physician recommends that you have lab work today: BMP/ CBC/ TSH  If you have labs (blood work) drawn today and your tests are completely normal, you will receive your results only by: MyChart Message (if you have MyChart) OR A paper copy in the mail If you have any lab test that is abnormal or we need to change your treatment, we will call you to review the results.   Testing/Procedures: - none ordered   Follow-Up: At [facility], you and your health needs are our priority.  As part of our continuing mission to provide you with exceptional heart care, we have created designated Provider Care Teams.  These Care Teams include your primary Cardiologist (physician) and Advanced Practice Providers (APPs -  Physician Assistants and Nurse Practitioners) who all work together to provide you with the care you need, when you need it.  We recommend signing up for the patient portal called "MyChart".  Sign up information is provided on this After Visit Summary.  MyChart is used to connect with patients for Virtual Visits (Telemedicine).  Patients are able to view lab/test results, encounter notes, upcoming appointments, etc.  Non-urgent messages can be sent to your provider as well.   To learn more about what you can do with MyChart, go to [email or web address].    Your next appointment:  1 year(s)  The format for your next appointment:   In Person  Provider:   Sherryl Manges, MD   Other Instructions N/a

## 2021-03-14 NOTE — Progress Notes (Signed)
Patient ID: Kyle Morales, male   DOB: 27-Jun-1936, 85 y.o.   MRN: [id number]      Patient Care Team: Katrinka Blazing, MD as PCP - General Ferdinand Cava, MD as PCP - Family Medicine Gollan, Tollie Pizza, MD as PCP - Cardiology (Cardiology) Antonieta Iba, MD as Consulting Physician (Cardiology)   HPI  Kyle Morales is a 85 y.o. male Seen in  follow-up for Medtronic pacemaker implanted for sinus node dysfunction remotely w gen change 2/17  Also atrial fibrillation dating at least back to 2014  for which he had been treated with amiodarone and warfarin.  Now on apixoban without bleeding and no Anti-arrhythmic drugs   History of coronary artery disease with prior stenting remotely; interval hip surgery at [facility] 3/22.  Developed tachycardia.  Cardiology reported "atrial tachycardia "treated with calcium blockers and metoprolol.    Today the patient denies chest pain,  nocturnal dyspnea, orthopnea or peripheral edema.  There have been no palpitations, lightheadedness or syncope.Complains of some dyspnea but mostly just profound limiting fatigue.  He dates it to the time of his surgery.  No new medications   his hip is much improved    He notes when he is close to the dishwasher his HR goes up to 105 bpm (having bought a pulse oximeter) and when he sit down he states HR goes back down  TO 70-75 bpm.  No change in oxygen saturations noted.  He have not notice any LE edema    He have not notice any bleeding on his Eliquis   DATE TEST EF%   6/14 Myoview    65 %   6/15 Myoview    6/18 Echo 50%   6/19 CTA  Aorta 3.7 cm   4/22 Echo (CE)  55-60%    Date Cr K Hgb  8/18  1.11 4.2    5/20 0.87 4.3 15.2  12/21 0.89  15.3  4/22 0.83 3.8 11.6    Past Medical History:  Diagnosis Date   CHF (congestive heart failure) (HCC)    Coronary artery disease    Eczema    Hyperlipidemia    Hypertension    Osteoarthrosis    Paroxysmal A-fib (HCC)    Presence of permanent cardiac pacemaker     S/P cardiac pacemaker procedure     Past Surgical History:  Procedure Laterality Date   CARDIAC CATHETERIZATION  1997   CARDIAC CATHETERIZATION  2000   COLONOSCOPY  2012   CORONARY ANGIOPLASTY  1997   ramus   ESOPHAGOGASTRODUODENOSCOPY N/A 02/11/2017   Procedure: ESOPHAGOGASTRODUODENOSCOPY (EGD);  Surgeon: Christena Deem, MD;  Location: [facility] ENDOSCOPY;  Service: Endoscopy;  Laterality: N/A;   HERNIA REPAIR     INSERT / REPLACE / REMOVE PACEMAKER  12/2005   Medtronic [id number] H - placed in [place]   MANDIBLE SURGERY     TONSILLECTOMY     TOTAL SHOULDER REPLACEMENT     bilateral     Current Outpatient Medications  Medication Sig Dispense Refill   diltiazem (TIAZAC) 120 MG 24 hr capsule      acetaminophen (TYLENOL) 500 MG tablet Take 500 mg by mouth every 8 (eight) hours as needed.     acetaZOLAMIDE (DIAMOX) 500 MG capsule      apixaban (ELIQUIS) 5 MG TABS tablet Take 1 tablet (5 mg total) by mouth 2 (two) times daily. 180 tablet 1   CVS ZINC 50 MG TABS Take 50 mg by mouth daily.     finasteride (  PROSCAR) 5 MG tablet Take 5 mg by mouth daily.     fluticasone (FLONASE) 50 MCG/ACT nasal spray Place 2 sprays into both nostrils 2 (two) times daily.     furosemide (LASIX) 40 MG tablet Take 120 mg by mouth daily.      ipratropium (ATROVENT HFA) 17 MCG/ACT inhaler Inhale 2 puffs into the lungs 4 (four) times daily. 1 Inhaler 0   lovastatin (MEVACOR) 20 MG tablet Take 1 tablet (20 mg total) by mouth at bedtime. 90 tablet 3   metoprolol succinate (TOPROL-XL) 50 MG 24 hr tablet Take 1 tablet (50 mg) by mouth twice daily. Take with or immediately following a meal. 180 tablet 3   metoprolol tartrate (LOPRESSOR) 25 MG tablet Take 1 tablet (25 mg total) by mouth 2 (two) times daily as needed. Take as needed for palpitations, HR > 100 180 tablet 3   nitrofurantoin, macrocrystal-monohydrate, (MACROBID) 100 MG capsule Take 100 mg by mouth daily.     nitroGLYCERIN (NITROSTAT) 0.4 MG SL tablet Place  1 tablet (0.4 mg total) under the tongue every 5 (five) minutes as needed for chest pain. 25 tablet 1   omeprazole (PRILOSEC) 20 MG capsule Take 20 mg by mouth daily.     oxyCODONE (OXY IR/ROXICODONE) 5 MG immediate release tablet Take 5 mg by mouth 2 (two) times daily as needed. (Patient not taking: Reported on 01/31/2021)     potassium chloride SA (KLOR-CON) 20 MEQ tablet Take 20 mEq by mouth daily.     RABEprazole (ACIPHEX) 20 MG tablet Take by mouth.     tamsulosin (FLOMAX) 0.4 MG CAPS capsule Take 0.8 mg by mouth daily after breakfast.      No current facility-administered medications for this visit.    Allergies  Allergen Reactions   Amoxicillin Rash and Itching   Review of Systems negative except from HPI and PMH  Physical Exam: BP 120/72 (BP Location: Left Arm, Patient Position: Sitting, Cuff Size: Normal)   Pulse 72   Ht 5\' 11"  (1.803 m)   Wt 171 lb (77.6 kg)   SpO2 95%   BMI 23.85 kg/m  Well developed and well nourished in no acute distress HENT normal Neck supple with JVP- <10  Lungs Clear Device pocket well healed; without hematoma or erythema.  There is no tethering  Regular rate and rhythm, no   murmur Abd-soft with active BS No Clubbing cyanosis Left LE edema Skin-warm and dry A & Oriented  Grossly normal sensory and motor function     Assessment and  Plan Atrial fibrillation-paroxysmal  Sinus node dysfunction  Pacemaker-Medtronic       Cardiomyopathy  Ectatic Aorta  Orthostatic lightheadedness  Shortness of breath COPD/ Asthma  HFpEF   VT NS Short recurrent episodes of atrial tach/flutter   on anticoagulation without bleeding continue Apixoban  5 bid   Recurrent asymptomatic VT NS -- continue metoprolol 50 mg  Recurrent atrial tachycardia.  Continue his metoprolol.  Heart rate by his report is extremely fast.  He is atrially paced and so likely related to hypersensitivity of his sensor.  We have reprogrammed it from a threshold of medium  low--medium-high and changing his ADL rate from 95--90 and his exertional rate from 130--120.  The fact that his heart rate was getting above 95 though suggest that it was exertional rate that somehow was being triggered; hence the threshold change.  The postoperative nature of it suggested there may be a postop anemia.  We will check a CBC  and a thyroid.     Mild edema.  We will continue his furosemide 120 mg a day.  We will check a metabolic profile  Denies orthostatic LH with statin  I,Stephanie Williams,acting as a scribe for Sherryl Manges, MD.,have documented all relevant documentation on the behalf of Sherryl Manges, MD,as directed by  Sherryl Manges, MD while in the presence of Sherryl Manges, MD.  I, Sherryl Manges, MD, have reviewed all documentation for this visit. The documentation on 03/14/21 for the exam, diagnosis, procedures, and orders are all accurate and complete.

## 2021-03-15 LAB — CBC WITH DIFFERENTIAL/PLATELET
Basophils Absolute: 0 10*3/uL (ref 0.0–0.2)
Basos: 1 %
EOS (ABSOLUTE): 0.6 10*3/uL — ABNORMAL HIGH (ref 0.0–0.4)
Eos: 14 %
Hematocrit: 41.5 % (ref 37.5–51.0)
Hemoglobin: 14.1 g/dL (ref 13.0–17.7)
Immature Grans (Abs): 0 10*3/uL (ref 0.0–0.1)
Immature Granulocytes: 0 %
Lymphocytes Absolute: 1.1 10*3/uL (ref 0.7–3.1)
Lymphs: 24 %
MCH: 33.4 pg — ABNORMAL HIGH (ref 26.6–33.0)
MCHC: 34 g/dL (ref 31.5–35.7)
MCV: 98 fL — ABNORMAL HIGH (ref 79–97)
Monocytes Absolute: 0.5 10*3/uL (ref 0.1–0.9)
Monocytes: 11 %
Neutrophils Absolute: 2.3 10*3/uL (ref 1.4–7.0)
Neutrophils: 50 %
Platelets: 167 10*3/uL (ref 150–450)
RBC: 4.22 x10E6/uL (ref 4.14–5.80)
RDW: 12.9 % (ref 11.6–15.4)
WBC: 4.6 10*3/uL (ref 3.4–10.8)

## 2021-03-15 LAB — BASIC METABOLIC PANEL
BUN/Creatinine Ratio: 15 (ref 10–24)
BUN: 15 mg/dL (ref 8–27)
CO2: 25 mmol/L (ref 20–29)
Calcium: 9.4 mg/dL (ref 8.6–10.2)
Chloride: 104 mmol/L (ref 96–106)
Creatinine, Ser: 1 mg/dL (ref 0.76–1.27)
Glucose: 97 mg/dL (ref 65–99)
Potassium: 3.9 mmol/L (ref 3.5–5.2)
Sodium: 143 mmol/L (ref 134–144)
eGFR: 75 mL/min/{1.73_m2} (ref >59)

## 2021-03-15 LAB — TSH: TSH: 2.01 u[IU]/mL (ref 0.450–4.500)

## 2021-03-29 ENCOUNTER — Telehealth: Payer: Self-pay | Admitting: Internal Medicine

## 2021-03-29 NOTE — Telephone Encounter (Signed)
Patient recently had device changes to pacemaker. Patients wife reports patient is noticeably worse than prior to changes. Wife reports extreme fatigue. Patient is supposed to go out of town for vacation on 7/19 and is concerned.   Please advise

## 2021-03-29 NOTE — Telephone Encounter (Signed)
Manual transmission received.  Device appears to be functioning normally.  No real change in histograms except maybe a little more skewed left.  Pt will need to come in for reprogramming as scheduled on 7/7.

## 2021-03-29 NOTE — Telephone Encounter (Signed)
I am able to work the patient in for 03/30/21 with Dr. Graciela Husbands in the [place] office.  I have called and spoken with the patient's wife. She and the patient are agreeable with coming in tomorrow at 11:40 am.   They were very appreciative for the call back.

## 2021-03-29 NOTE — Telephone Encounter (Signed)
Spoke with pt wife. (DPR on file)  She states that since 6/21 appt pt has been feeling worse.  He has been extremely fatigued with no energy to do anything.  He had been having an issue with this prior to the appt and changes were made in device.  Per spouse Dr. Graciela Husbands said the changes might help, however they could also make it worse.  She feels the patient is worse.  She would like for Dr. Graciela Husbands to see patient before they go on vacation on 7/19.     Requested she have patient send manual transmission from remote monitor so we can determine device function. Patient remote monitor ot currently plugged in, she will have him plug it in and send transmission but may take a couple of hours.  Educated spouse that monitor should remain plugged in at all times.    Offered appt with EP APP or Device clinic at [address] office.  She states it must be in [place] and she would prefer Dr. Graciela Husbands  Changes made at 6/21 appt: "Heart rate by his report is extremely fast.  He is atrially paced and so likely related to hypersensitivity of his sensor.  We have reprogrammed it from a threshold of medium low--medium-high and changing his ADL rate from 95--90 and his exertional rate from 130--120.  The fact that his heart rate was getting above 95 though suggest that it was exertional rate that somehow was being triggered; hence the threshold change."

## 2021-03-30 ENCOUNTER — Ambulatory Visit (INDEPENDENT_AMBULATORY_CARE_PROVIDER_SITE_OTHER): Payer: Medicare Other | Admitting: Internal Medicine

## 2021-03-30 ENCOUNTER — Encounter: Payer: Self-pay | Admitting: Internal Medicine

## 2021-03-30 ENCOUNTER — Other Ambulatory Visit: Payer: Self-pay

## 2021-03-30 VITALS — BP 100/64 | HR 74 | Ht 70.0 in | Wt 173.0 lb

## 2021-03-30 DIAGNOSIS — I10 Essential (primary) hypertension: Secondary | ICD-10-CM

## 2021-03-30 DIAGNOSIS — I480 Paroxysmal atrial fibrillation: Secondary | ICD-10-CM | POA: Diagnosis not present

## 2021-03-30 DIAGNOSIS — I495 Sick sinus syndrome: Secondary | ICD-10-CM | POA: Diagnosis not present

## 2021-03-30 DIAGNOSIS — Z950 Presence of cardiac pacemaker: Secondary | ICD-10-CM

## 2021-03-30 DIAGNOSIS — I25118 Atherosclerotic heart disease of native coronary artery with other forms of angina pectoris: Secondary | ICD-10-CM

## 2021-03-30 NOTE — Progress Notes (Signed)
Patient ID: Kyle Morales, male   DOB: 06-03-1936, 85 y.o.   MRN: [id number]      Patient Care Team: Katrinka Blazing, MD as PCP - General Ferdinand Cava, MD as PCP - Family Medicine Gollan, Tollie Pizza, MD as PCP - Cardiology (Cardiology) Antonieta Iba, MD as Consulting Physician (Cardiology)   HPI  Kyle Morales is a 85 y.o. male Seen in  follow-up for Medtronic pacemaker implanted for sinus node dysfunction remotely w gen change 2/17  Also atrial fibrillation dating at least back to 2014  for which he had been treated with amiodarone and warfarin.  Now on apixoban without bleeding and no Anti-arrhythmic drugs   History of coronary artery disease with prior stenting remotely; interval hip surgery at [facility] 3/22.  Developed tachycardia.  Cardiology reported "atrial tachycardia "treated with calcium blockers and metoprolol.   Last week noted when he is close to the dishwasher his HR goes up to 105 bpm (having bought a pulse oximeter) and when he sit down he states HR goes back down  TO 70-75 bpm.  No change in oxygen saturations noted.  We reprogrammed the device to decrease heart rate excursion.  He comes in today with complaints that he feels very fatigued.  No other changes were made  He have not notice any LE edema     DATE TEST EF%   6/14 Myoview    65 %   6/15 Myoview    6/18 Echo 50%   6/19 CTA  Aorta 3.7 cm   4/22 Echo (CE)  55-60%    Date Cr K Hgb  8/18  1.11 4.2    5/20 0.87 4.3 15.2  12/21 0.89  15.3  4/22 0.83 3.8 11.6    Past Medical History:  Diagnosis Date   CHF (congestive heart failure) (HCC)    Coronary artery disease    Eczema    Hyperlipidemia    Hypertension    Osteoarthrosis    Paroxysmal A-fib (HCC)    Presence of permanent cardiac pacemaker    S/P cardiac pacemaker procedure     Past Surgical History:  Procedure Laterality Date   CARDIAC CATHETERIZATION  1997   CARDIAC CATHETERIZATION  2000   COLONOSCOPY  2012   CORONARY ANGIOPLASTY   1997   ramus   ESOPHAGOGASTRODUODENOSCOPY N/A 02/11/2017   Procedure: ESOPHAGOGASTRODUODENOSCOPY (EGD);  Surgeon: Christena Deem, MD;  Location: [facility] ENDOSCOPY;  Service: Endoscopy;  Laterality: N/A;   HERNIA REPAIR     INSERT / REPLACE / REMOVE PACEMAKER  12/2005   Medtronic [id number] - placed in [place]   MANDIBLE SURGERY     TONSILLECTOMY     TOTAL SHOULDER REPLACEMENT     bilateral     Current Outpatient Medications  Medication Sig Dispense Refill   acetaminophen (TYLENOL) 500 MG tablet Take 500 mg by mouth every 8 (eight) hours as needed.     apixaban (ELIQUIS) 5 MG TABS tablet Take 1 tablet (5 mg total) by mouth 2 (two) times daily. 180 tablet 1   [facility] ZINC 50 MG TABS Take 50 mg by mouth daily.     finasteride (PROSCAR) 5 MG tablet Take 5 mg by mouth daily.     fluticasone (FLONASE) 50 MCG/ACT nasal spray Place 2 sprays into both nostrils 2 (two) times daily.     furosemide (LASIX) 40 MG tablet Take 120 mg by mouth daily.      ipratropium (ATROVENT HFA) 17 MCG/ACT inhaler Inhale 2 puffs  into the lungs 4 (four) times daily. 1 Inhaler 0   lovastatin (MEVACOR) 20 MG tablet Take 1 tablet (20 mg total) by mouth at bedtime. 90 tablet 3   metoprolol succinate (TOPROL-XL) 50 MG 24 hr tablet Take 1 tablet (50 mg) by mouth twice daily. Take with or immediately following a meal. 180 tablet 3   metoprolol tartrate (LOPRESSOR) 25 MG tablet Take 1 tablet (25 mg total) by mouth 2 (two) times daily as needed. Take as needed for palpitations, HR > 100 180 tablet 3   nitroGLYCERIN (NITROSTAT) 0.4 MG SL tablet Place 1 tablet (0.4 mg total) under the tongue every 5 (five) minutes as needed for chest pain. 25 tablet 1   omeprazole (PRILOSEC) 20 MG capsule Take 20 mg by mouth daily.     potassium chloride SA (KLOR-CON) 20 MEQ tablet Take 20 mEq by mouth daily.     tamsulosin (FLOMAX) 0.4 MG CAPS capsule Take 0.8 mg by mouth daily after breakfast.      No current facility-administered medications for  this visit.    Allergies  Allergen Reactions   Amoxicillin Rash and Itching   Review of Systems negative except from HPI and PMH  Physical Exam: BP 100/64 (BP Location: Right Arm, Patient Position: Sitting, Cuff Size: Normal)   Pulse 74   Ht 5\' 10"  (1.778 m)   Wt 173 lb (78.5 kg)   SpO2 97%   BMI 24.82 kg/m  Well developed and well nourished in no acute distress HENT normal Neck supple with JVP- <10  Lungs Clear Device pocket well healed; without hematoma or erythema.  There is no tethering  Regular rate and rhythm, no   murmur Abd-soft with active BS No Clubbing cyanosis Left LE edema Skin-warm and dry A & Oriented  Grossly normal sensory and motor function     Assessment and  Plan Atrial fibrillation-paroxysmal  Sinus node dysfunction  Pacemaker-Medtronic       Cardiomyopathy  Ectatic Aorta  Orthostatic lightheadedness  Shortness of breath COPD/ Asthma  HFpEF   VT NS   Significant fatigue associated with device reprogramming undertaken last time because of, to my eye excessively rapid rates.  Feeling terrible, we will reprogram his UOs.  I am a little bit surprised that the fatigue is so striking with the albeit modest change in heart rate excursion.  Blood pressure today is low.  Anomalously.  Advised him to let know if he is having blood pressures in the 100 range.    I,Stephanie Williams,acting as a [place] for Neurosurgeon, MD.,have documented all relevant documentation on the behalf of Sherryl Manges, MD,as directed by  Sherryl Manges, MD while in the presence of Sherryl Manges, MD.  I, Sherryl Manges, MD, have reviewed all documentation for this visit. The documentation on 03/30/21 for the exam, diagnosis, procedures, and orders are all accurate and complete.

## 2021-03-30 NOTE — Patient Instructions (Addendum)
Medication Instructions:  - Your physician recommends that you continue on your current medications as directed. Please refer to the Current Medication list given to you today.  *If you need a refill on your cardiac medications before your next appointment, please call your pharmacy*   Lab Work: - none ordered  If you have labs (blood work) drawn today and your tests are completely normal, you will receive your results only by: MyChart Message (if you have MyChart) OR A paper copy in the mail If you have any lab test that is abnormal or we need to change your treatment, we will call you to review the results.   Testing/Procedures: - none ordered   Follow-Up: At [facility], you and your health needs are our priority.  As part of our continuing mission to provide you with exceptional heart care, we have created designated Provider Care Teams.  These Care Teams include your primary Cardiologist (physician) and Advanced Practice Providers (APPs -  Physician Assistants and Nurse Practitioners) who all work together to provide you with the care you need, when you need it.  We recommend signing up for the patient portal called "MyChart".  Sign up information is provided on this After Visit Summary.  MyChart is used to connect with patients for Virtual Visits (Telemedicine).  Patients are able to view lab/test results, encounter notes, upcoming appointments, etc.  Non-urgent messages can be sent to your provider as well.   To learn more about what you can do with MyChart, go to [email or web address].    Your next appointment:   1) in November 2022 with Dr. Mariah Milling  2) in June 2023 with Dr. Graciela Husbands  The format for your next appointment:   In Person  Provider:   As above   Other Instructions - Monitor your blood pressure at home and please call if the readings are consistently low (top # less than 105)

## 2021-03-30 NOTE — Progress Notes (Signed)
Error

## 2021-05-02 ENCOUNTER — Ambulatory Visit (INDEPENDENT_AMBULATORY_CARE_PROVIDER_SITE_OTHER): Payer: Medicare Other

## 2021-05-02 DIAGNOSIS — I495 Sick sinus syndrome: Secondary | ICD-10-CM

## 2021-05-02 LAB — CUP PACEART REMOTE DEVICE CHECK
Battery Remaining Longevity: 49 mo
Battery Voltage: 2.98 V
Brady Statistic AP VP Percent: 0.26 %
Brady Statistic AP VS Percent: 96.3 %
Brady Statistic AS VP Percent: 0.03 %
Brady Statistic AS VS Percent: 3.41 %
Brady Statistic RA Percent Paced: 96.14 %
Brady Statistic RV Percent Paced: 0.31 %
Date Time Interrogation Session: 20220809074217
Implantable Lead Implant Date: 20070411
Implantable Lead Implant Date: 20070411
Implantable Lead Location: 753859
Implantable Lead Location: 753860
Implantable Lead Model: 5076
Implantable Lead Model: 5076
Implantable Pulse Generator Implant Date: 20170224
Lead Channel Impedance Value: 342 Ohm
Lead Channel Impedance Value: 342 Ohm
Lead Channel Impedance Value: 380 Ohm
Lead Channel Impedance Value: 456 Ohm
Lead Channel Pacing Threshold Amplitude: 0.625 V
Lead Channel Pacing Threshold Amplitude: 1.375 V
Lead Channel Pacing Threshold Pulse Width: 0.4 ms
Lead Channel Pacing Threshold Pulse Width: 0.4 ms
Lead Channel Sensing Intrinsic Amplitude: 1.75 mV
Lead Channel Sensing Intrinsic Amplitude: 1.75 mV
Lead Channel Sensing Intrinsic Amplitude: 5.625 mV
Lead Channel Sensing Intrinsic Amplitude: 5.625 mV
Lead Channel Setting Pacing Amplitude: 2 V
Lead Channel Setting Pacing Amplitude: 3 V
Lead Channel Setting Pacing Pulse Width: 0.4 ms
Lead Channel Setting Sensing Sensitivity: 0.9 mV

## 2021-05-25 NOTE — Progress Notes (Signed)
Remote pacemaker transmission.   

## 2021-07-18 ENCOUNTER — Other Ambulatory Visit: Payer: Self-pay

## 2021-07-18 MED ORDER — APIXABAN 5 MG PO TABS: 5.0000 mg | ORAL_TABLET | Freq: Two times a day (BID) | ORAL | 1 refills | Status: DC

## 2021-07-18 NOTE — Telephone Encounter (Signed)
Refill request

## 2021-07-18 NOTE — Telephone Encounter (Signed)
*  STAT* If patient is at the pharmacy, call can be transferred to refill team.   1. Which medications need to be refilled? (please list name of each medication and dose if known) Eliquis  2. Which pharmacy/location (including street and city if local pharmacy) is medication to be sent to?[facility]  3. Do they need a 30 day or 90 day supply? 90  

## 2021-07-18 NOTE — Telephone Encounter (Signed)
Prescription refill request for Eliquis received. Indication:afib Last office visit:klein 03/30/21 Scr:1.00 03/14/21 Age: 89m Weight:78.5kg

## 2021-07-25 DIAGNOSIS — K4090 Unilateral inguinal hernia, without obstruction or gangrene, not specified as recurrent: Secondary | ICD-10-CM | POA: Insufficient documentation

## 2021-07-25 DIAGNOSIS — Z96649 Presence of unspecified artificial hip joint: Secondary | ICD-10-CM | POA: Insufficient documentation

## 2021-08-01 ENCOUNTER — Ambulatory Visit (INDEPENDENT_AMBULATORY_CARE_PROVIDER_SITE_OTHER): Payer: Medicare Other

## 2021-08-01 DIAGNOSIS — I495 Sick sinus syndrome: Secondary | ICD-10-CM | POA: Diagnosis not present

## 2021-08-02 LAB — CUP PACEART REMOTE DEVICE CHECK
Battery Remaining Longevity: 47 mo
Battery Voltage: 2.98 V
Brady Statistic AP VP Percent: 0.32 %
Brady Statistic AP VS Percent: 91.92 %
Brady Statistic AS VP Percent: 0.66 %
Brady Statistic AS VS Percent: 7.1 %
Brady Statistic RA Percent Paced: 91.84 %
Brady Statistic RV Percent Paced: 1.04 %
Date Time Interrogation Session: 20221109140451
Implantable Lead Implant Date: 20070411
Implantable Lead Implant Date: 20070411
Implantable Lead Location: 753859
Implantable Lead Location: 753860
Implantable Lead Model: 5076
Implantable Lead Model: 5076
Implantable Pulse Generator Implant Date: 20170224
Lead Channel Impedance Value: 342 Ohm
Lead Channel Impedance Value: 342 Ohm
Lead Channel Impedance Value: 380 Ohm
Lead Channel Impedance Value: 456 Ohm
Lead Channel Pacing Threshold Amplitude: 0.75 V
Lead Channel Pacing Threshold Amplitude: 1 V
Lead Channel Pacing Threshold Pulse Width: 0.4 ms
Lead Channel Pacing Threshold Pulse Width: 0.4 ms
Lead Channel Sensing Intrinsic Amplitude: 1.25 mV
Lead Channel Sensing Intrinsic Amplitude: 1.25 mV
Lead Channel Sensing Intrinsic Amplitude: 4.625 mV
Lead Channel Sensing Intrinsic Amplitude: 4.625 mV
Lead Channel Setting Pacing Amplitude: 2 V
Lead Channel Setting Pacing Amplitude: 2.75 V
Lead Channel Setting Pacing Pulse Width: 0.4 ms
Lead Channel Setting Sensing Sensitivity: 0.9 mV

## 2021-08-03 ENCOUNTER — Telehealth: Payer: Self-pay

## 2021-08-03 NOTE — Telephone Encounter (Signed)
Scheduled remote reviewed. Normal device function.   There were 35 nonsustained ventricular arrhythmias detected, three of these events fell into the VT monitor only zone and were greater than 20 beats.  Sent to triage.  There was one fast A&V event detected.  Known PAF, on OAC, AF burden is 5.3% of the time, longest episode was 15 hours.  Pt meds include: Metoprolol 50mg  BID, +extra 25mg  as needed for palpitations.    Spoke with pt regarding Monitored VT episode on 08/01/21.  Pt denies any cardiac symptoms at time of event.  States he would ave been doing his nightly routine following dinner, showering and getting ready for bed.  Pt confirmed compliance with meds as ordered and indicates he has not taken a PRN dose of Metoprolol recently.

## 2021-08-06 NOTE — Progress Notes (Signed)
Cardiology Office Note  Date:  08/07/2021   ID:  Kyle Morales, DOB September 02, 1936, MRN [id number]  PCP:  Katrinka Blazing, MD   Chief Complaint  Patient presents with   6 month follow up     "Doing well." Medications reviewed by the patient verbally.      HPI:  Kyle Morales is a  85 year old gentleman history of  paroxysmal atrial fibrillation dating back to 1992   coronary artery disease with stent placement at [facility] in 1997,  sinus node dysfunction pacemaker placement in [place] in 2007 for diagnosis of heart block chronic diarrhea Pacemaker change out done at [facility],  short run of nonsustained VT, 9 seconds Who presents for followup of his paroxysmal atrial fibrillation and coronary artery disease.  LOV with myself 5/22 Seen by Dr. Graciela Husbands 7/22 for atrial fib Reported fatigue, pacer reprogrammed Continues to have fatigue  BP low at home sedentary Past few days "feels drunk"  Last pacer download, reviewed with him AF burden is 5.3% of the  time, longest episode was 15 hours.   on eliquis, on metoprolol  succinate 50 daily  on lasix 120 daily, edema stable in general, more edema past 2 days Takes extra lasix PRN Swelling trace today R>L  EKG personally reviewed by myself on todays visit Paced  rhythm rate 75 bpm  With past medical history reviewed  hip surgery 12/20/20 at [facility] Notes reviewed, EKGs indicating he developed accelerated junctional rhythm  ECG: 12/23/20 atrial tach, RBBB, single V paced complex Cardiology was consulted at [facility], they reported he had atrial tachycardia Initially placed on diltiazem and metoprolol, metoprolol was held for low blood pressure  Echo at [facility]   2. Echo contrast utilized to enhance endocardial border definition.    3. The left ventricular systolic function is normal, LVEF is visually  estimated at 55-60%.    4. The right ventricle is not well visualized but probably normal in size,  with normal systolic function.   Went to [facility]  10 days for PT  Prior history of UTI, went to [facility] treated with ABX, enterococcus Faecalis in the urine culture  Echocardiogram March 14, 2017 Results discussed with him in detail Ejection fraction 50-55%, mildly dilated aortic root 39 mm  Left atrium severely dilated, mild to moderate MR   Previous history of atrial fibrillation in the setting of GI distress and diarrhea   cultures were negative. Managed by GI   PMH:   has a past medical history of CHF (congestive heart failure) (HCC), Coronary artery disease, Eczema, Hyperlipidemia, Hypertension, Osteoarthrosis, Paroxysmal A-fib (HCC), Presence of permanent cardiac pacemaker, and S/P cardiac pacemaker procedure.  PSH:    Past Surgical History:  Procedure Laterality Date   CARDIAC CATHETERIZATION  1997   CARDIAC CATHETERIZATION  2000   COLONOSCOPY  2012   CORONARY ANGIOPLASTY  1997   ramus   ESOPHAGOGASTRODUODENOSCOPY N/A 02/11/2017   Procedure: ESOPHAGOGASTRODUODENOSCOPY (EGD);  Surgeon: Christena Deem, MD;  Location: [facility] ENDOSCOPY;  Service: Endoscopy;  Laterality: N/A;   HERNIA REPAIR     INSERT / REPLACE / REMOVE PACEMAKER  12/2005   Medtronic [id number] - placed in [place]   MANDIBLE SURGERY     TONSILLECTOMY     TOTAL SHOULDER REPLACEMENT     bilateral     Current Outpatient Medications  Medication Sig Dispense Refill   acetaminophen (TYLENOL) 500 MG tablet Take 500 mg by mouth every 8 (eight) hours as needed.     apixaban (ELIQUIS)  5 MG TABS tablet Take 1 tablet (5 mg total) by mouth 2 (two) times daily. 180 tablet 1   CVS ZINC 50 MG TABS Take 50 mg by mouth daily.     finasteride (PROSCAR) 5 MG tablet Take 5 mg by mouth daily.     fluticasone (FLONASE) 50 MCG/ACT nasal spray Place 2 sprays into both nostrils 2 (two) times daily.     furosemide (LASIX) 40 MG tablet Take 120 mg by mouth daily.      ipratropium (ATROVENT HFA) 17 MCG/ACT inhaler Inhale 2 puffs into the lungs 4 (four) times daily. 1 Inhaler 0    lovastatin (MEVACOR) 20 MG tablet Take 1 tablet (20 mg total) by mouth at bedtime. 90 tablet 3   metoprolol succinate (TOPROL-XL) 50 MG 24 hr tablet Take 1 tablet (50 mg) by mouth twice daily. Take with or immediately following a meal. 180 tablet 3   metoprolol tartrate (LOPRESSOR) 25 MG tablet Take 1 tablet (25 mg total) by mouth 2 (two) times daily as needed. Take as needed for palpitations, HR > 100 180 tablet 3   nitroGLYCERIN (NITROSTAT) 0.4 MG SL tablet Place 1 tablet (0.4 mg total) under the tongue every 5 (five) minutes as needed for chest pain. 25 tablet 1   omeprazole (PRILOSEC) 20 MG capsule Take 20 mg by mouth daily.     potassium chloride SA (KLOR-CON) 20 MEQ tablet Take 20 mEq by mouth daily.     tamsulosin (FLOMAX) 0.4 MG CAPS capsule Take 0.8 mg by mouth daily after breakfast.      No current facility-administered medications for this visit.    Allergies:   Amoxicillin   Social History:  The patient  reports that he has never smoked. He has never used smokeless tobacco. He reports that he does not drink alcohol and does not use drugs.   Family History:   family history includes Heart attack in his father; Heart disease in his brother and mother; Hyperlipidemia in his mother; Hypertension in his mother.    Review of Systems: Review of Systems  Constitutional: Negative.   HENT: Negative.    Respiratory: Negative.    Cardiovascular:  Positive for leg swelling.  Gastrointestinal: Negative.   Musculoskeletal: Negative.        Leg weakness  Neurological: Negative.   Psychiatric/Behavioral: Negative.    All other systems reviewed and are negative.  PHYSICAL EXAM: VS:  BP 120/70 (BP Location: Left Arm, Patient Position: Sitting, Cuff Size: Normal)   Pulse 75   Ht 5\' 11"  (1.803 m)   Wt 165 lb 6 oz (75 kg)   SpO2 98%   BMI 23.07 kg/m  , BMI Body mass index is 23.07 kg/m. Constitutional:  oriented to person, place, and time. No distress.  HENT:  Head: Grossly  normal Eyes:  no discharge. No scleral icterus.  Neck: No JVD, no carotid bruits  Cardiovascular: Regular rate and rhythm, no murmurs appreciated Pulmonary/Chest: Clear to auscultation bilaterally, no wheezes or rails Abdominal: Soft.  no distension.  no tenderness.  Musculoskeletal: Normal range of motion Neurological:  normal muscle tone. Coordination normal. No atrophy Skin: Skin warm and dry Psychiatric: normal affect, pleasant   Recent Labs: 03/14/2021: BUN 15; Creatinine, Ser 1.00; Hemoglobin 14.1; Platelets 167; Potassium 3.9; Sodium 143; TSH 2.010    Lipid Panel Lab Results  Component Value Date   CHOL 136 02/20/2017   HDL 32 (L) 02/20/2017   LDLCALC 70 02/20/2017   TRIG 172 (H) 02/20/2017  Wt Readings from Last 3 Encounters:  08/07/21 165 lb 6 oz (75 kg)  03/30/21 173 lb (78.5 kg)  03/14/21 171 lb (77.6 kg)     ASSESSMENT AND PLAN:  Atrial fibrillation, unspecified type (HCC) - 5 percent burden on pacer download On eliquis, no sx Unclear if his fatigue is related to episodes of his atrial fibrillation, discussed with him  Nonsustained VT Monitored by EP Rare short epsiodes  Pure hypercholesterolemia On lovastatin, total chol 105, LDL75 Numbers at goal  Atherosclerosis of native coronary artery of native heart  Currently with no symptoms of angina. No further workup at this time. Continue current medication regimen.  Orthostatic hypotension Asymptomatic Continue metoprolol succinate 50 BID  Leg swelling Trace swelling on the right, minimal on the left Suspect a component of venous insufficiency Taking lasix 120 mg daily  Hypothyroidism Followed by primary care   Total encounter time more than 25 minutes  Greater than 50% was spent in counseling and coordination of care with the patient   Orders Placed This Encounter  Procedures   EKG 12-Lead     Signed, Dossie Arbour, M.D., Ph.D. 08/07/2021  [facility] [place],  [place] [phone]

## 2021-08-07 ENCOUNTER — Ambulatory Visit (INDEPENDENT_AMBULATORY_CARE_PROVIDER_SITE_OTHER): Payer: Medicare Other | Admitting: Cardiovascular Disease

## 2021-08-07 ENCOUNTER — Encounter: Payer: Self-pay | Admitting: Cardiovascular Disease

## 2021-08-07 ENCOUNTER — Other Ambulatory Visit: Payer: Self-pay

## 2021-08-07 VITALS — BP 120/70 | HR 75 | Ht 71.0 in | Wt 165.4 lb

## 2021-08-07 DIAGNOSIS — I5032 Chronic diastolic (congestive) heart failure: Secondary | ICD-10-CM | POA: Diagnosis not present

## 2021-08-07 DIAGNOSIS — I10 Essential (primary) hypertension: Secondary | ICD-10-CM | POA: Diagnosis not present

## 2021-08-07 DIAGNOSIS — I480 Paroxysmal atrial fibrillation: Secondary | ICD-10-CM | POA: Diagnosis not present

## 2021-08-07 DIAGNOSIS — I495 Sick sinus syndrome: Secondary | ICD-10-CM

## 2021-08-07 DIAGNOSIS — E782 Mixed hyperlipidemia: Secondary | ICD-10-CM

## 2021-08-07 DIAGNOSIS — Z950 Presence of cardiac pacemaker: Secondary | ICD-10-CM

## 2021-08-07 DIAGNOSIS — I25118 Atherosclerotic heart disease of native coronary artery with other forms of angina pectoris: Secondary | ICD-10-CM

## 2021-08-07 MED ORDER — FUROSEMIDE 40 MG PO TABS
40.0000 mg | ORAL_TABLET | Freq: Every day | ORAL | 3 refills | Status: DC
Start: 2021-08-07 — End: 2021-10-03

## 2021-08-07 MED ORDER — METOPROLOL TARTRATE 25 MG PO TABS: 25.0000 mg | ORAL_TABLET | Freq: Two times a day (BID) | ORAL | 3 refills | Status: DC | PRN

## 2021-08-07 MED ORDER — LOVASTATIN 20 MG PO TABS: 20.0000 mg | ORAL_TABLET | Freq: Every day | ORAL | 3 refills | Status: DC

## 2021-08-07 MED ORDER — POTASSIUM CHLORIDE CRYS ER 20 MEQ PO TBCR: 20.0000 meq | EXTENDED_RELEASE_TABLET | Freq: Every day | ORAL | 3 refills | Status: DC

## 2021-08-07 MED ORDER — METOPROLOL SUCCINATE ER 50 MG PO TB24: ORAL_TABLET | ORAL | 3 refills | Status: DC

## 2021-08-07 NOTE — Patient Instructions (Addendum)
Medication Instructions:  No changes  *If you need a refill on your cardiac medications before your next appointment, please call your pharmacy*  Lab Work: None  Testing/Procedures: None  Follow-Up: At [facility], you and your health needs are our priority.  As part of our continuing mission to provide you with exceptional heart care, we have created designated Provider Care Teams.  These Care Teams include your primary Cardiologist (physician) and Advanced Practice Providers (APPs -  Physician Assistants and Nurse Practitioners) who all work together to provide you with the care you need, when you need it.   Your next appointment:   12 month(s)  The format for your next appointment:   In Person  Provider:   You may see Timothy Gollan, MD or one of the following Advanced Practice Providers on your designated Care Team:   Christopher Berge, NP Ryan Dunn, PA-C Cadence Furth, PA-C    

## 2021-08-09 NOTE — Progress Notes (Signed)
Remote pacemaker transmission.   

## 2021-08-21 ENCOUNTER — Emergency Department
Admission: EM | Admit: 2021-08-21 | Discharge: 2021-08-21 | Disposition: A | Discharge status: Home / Self Care | Payer: Medicare Other | Attending: Emergency Medicine | Admitting: Emergency Medicine

## 2021-08-21 ENCOUNTER — Encounter: Payer: Self-pay | Admitting: Emergency Medicine

## 2021-08-21 ENCOUNTER — Emergency Department: Payer: Medicare Other

## 2021-08-21 DIAGNOSIS — R042 Hemoptysis: Secondary | ICD-10-CM | POA: Diagnosis not present

## 2021-08-21 DIAGNOSIS — Z96611 Presence of right artificial shoulder joint: Secondary | ICD-10-CM | POA: Diagnosis not present

## 2021-08-21 DIAGNOSIS — Z950 Presence of cardiac pacemaker: Secondary | ICD-10-CM | POA: Diagnosis not present

## 2021-08-21 DIAGNOSIS — I110 Hypertensive heart disease with heart failure: Secondary | ICD-10-CM | POA: Insufficient documentation

## 2021-08-21 DIAGNOSIS — Z96612 Presence of left artificial shoulder joint: Secondary | ICD-10-CM | POA: Diagnosis not present

## 2021-08-21 DIAGNOSIS — J9801 Acute bronchospasm: Secondary | ICD-10-CM | POA: Diagnosis not present

## 2021-08-21 DIAGNOSIS — I480 Paroxysmal atrial fibrillation: Secondary | ICD-10-CM | POA: Diagnosis not present

## 2021-08-21 DIAGNOSIS — Z7901 Long term (current) use of anticoagulants: Secondary | ICD-10-CM | POA: Insufficient documentation

## 2021-08-21 DIAGNOSIS — Z79899 Other long term (current) drug therapy: Secondary | ICD-10-CM | POA: Insufficient documentation

## 2021-08-21 DIAGNOSIS — I2510 Atherosclerotic heart disease of native coronary artery without angina pectoris: Secondary | ICD-10-CM | POA: Insufficient documentation

## 2021-08-21 DIAGNOSIS — J111 Influenza due to unidentified influenza virus with other respiratory manifestations: Secondary | ICD-10-CM | POA: Diagnosis not present

## 2021-08-21 DIAGNOSIS — I5032 Chronic diastolic (congestive) heart failure: Secondary | ICD-10-CM | POA: Diagnosis not present

## 2021-08-21 DIAGNOSIS — R059 Cough, unspecified: Secondary | ICD-10-CM | POA: Diagnosis present

## 2021-08-21 LAB — BASIC METABOLIC PANEL
Anion gap: 5 (ref 5–15)
BUN: 17 mg/dL (ref 8–23)
CO2: 29 mmol/L (ref 22–32)
Calcium: 9 mg/dL (ref 8.9–10.3)
Chloride: 103 mmol/L (ref 98–111)
Creatinine, Ser: 0.76 mg/dL (ref 0.61–1.24)
GFR, Estimated: >60 mL/min (ref >60)
Glucose, Bld: 94 mg/dL (ref 70–99)
Potassium: 3.6 mmol/L (ref 3.5–5.1)
Sodium: 137 mmol/L (ref 135–145)

## 2021-08-21 LAB — CBC WITH DIFFERENTIAL/PLATELET
Abs Immature Granulocytes: 0.02 10*3/uL (ref 0.00–0.07)
Basophils Absolute: 0 10*3/uL (ref 0.0–0.1)
Basophils Relative: 1 %
Eosinophils Absolute: 0.4 10*3/uL (ref 0.0–0.5)
Eosinophils Relative: 6 %
HCT: 41.4 % (ref 39.0–52.0)
Hemoglobin: 14.2 g/dL (ref 13.0–17.0)
Immature Granulocytes: 0 %
Lymphocytes Relative: 20 %
Lymphs Abs: 1.3 10*3/uL (ref 0.7–4.0)
MCH: 34.6 pg — ABNORMAL HIGH (ref 26.0–34.0)
MCHC: 34.3 g/dL (ref 30.0–36.0)
MCV: 101 fL — ABNORMAL HIGH (ref 80.0–100.0)
Monocytes Absolute: 0.7 10*3/uL (ref 0.1–1.0)
Monocytes Relative: 11 %
Neutro Abs: 4.1 10*3/uL (ref 1.7–7.7)
Neutrophils Relative %: 62 %
Platelets: 185 10*3/uL (ref 150–400)
RBC: 4.1 MIL/uL — ABNORMAL LOW (ref 4.22–5.81)
RDW: 12.8 % (ref 11.5–15.5)
WBC: 6.5 10*3/uL (ref 4.0–10.5)
nRBC: 0 % (ref 0.0–0.2)

## 2021-08-21 MED ORDER — PREDNISONE 20 MG PO TABS
60.0000 mg | ORAL_TABLET | Freq: Once | ORAL | Status: AC
  Administered 2021-08-21: 21:00:00 60 mg via ORAL
  Filled 2021-08-21: qty 3

## 2021-08-21 MED ORDER — IPRATROPIUM-ALBUTEROL 0.5-2.5 (3) MG/3ML IN SOLN
3.0000 mL | Freq: Once | RESPIRATORY_TRACT | Status: AC
  Administered 2021-08-21: 21:00:00 3 mL via RESPIRATORY_TRACT
  Filled 2021-08-21: qty 3

## 2021-08-21 MED ORDER — PREDNISONE 50 MG PO TABS: 50.0000 mg | ORAL_TABLET | Freq: Every day | ORAL | 0 refills | Status: DC

## 2021-08-21 NOTE — ED Triage Notes (Signed)
Pt in with PNA x 1 wk. Has been on BID course of Doxycycline since, but feels no improvement of symptoms, and started coughing up blood last night, states bright red. Sats 95% in triage, some chest soreness, sob with exertion.

## 2021-08-21 NOTE — ED Provider Notes (Signed)
[facility] Emergency Department Provider Note   ____________________________________________    I have reviewed the triage vital signs and the nursing notes.   HISTORY  Chief Complaint Pneumonia and Hemoptysis     HPI Kyle Morales is a 85 y.o. male with a history of CHF, CAD, paroxysmal A. fib on Eliquis who presents with complaints of hemoptysis, cough.  Patient reports he has had bad cough for nearly a week, was seen in urgent care last week, diagnosed with pneumonia and started on doxycycline.  In reviewing his records he had a flu positive antigen test as well, he was unaware of this.  He reports his symptoms have improved mildly but he continues to have a significant cough.  Today he noticed a small amount of blood mixed with sputum.  Past Medical History:  Diagnosis Date   CHF (congestive heart failure) (HCC)    Coronary artery disease    Eczema    Hyperlipidemia    Hypertension    Osteoarthrosis    Paroxysmal A-fib (HCC)    Presence of permanent cardiac pacemaker    S/P cardiac pacemaker procedure     Patient Active Problem List   Diagnosis Date Noted   Acute bronchitis 07/29/2015   Chronic diastolic CHF (congestive heart failure) (HCC) 01/28/2015   CHF (congestive heart failure) (HCC)    Coronary artery disease    Diarrhea 07/30/2014   Encounter for therapeutic drug monitoring 07/14/2014   Orthostatic hypotension 04/28/2014   Coronary atherosclerosis of native coronary artery 03/12/2013   SOB (shortness of breath) 03/12/2013   A-fib (HCC) 03/12/2013   Hyperlipidemia 03/12/2013   Cardiac pacemaker in situ 03/12/2013   Essential hypertension 03/12/2013    Past Surgical History:  Procedure Laterality Date   CARDIAC CATHETERIZATION  1997   CARDIAC CATHETERIZATION  2000   COLONOSCOPY  2012   CORONARY ANGIOPLASTY  1997   ramus   ESOPHAGOGASTRODUODENOSCOPY N/A 02/11/2017   Procedure: ESOPHAGOGASTRODUODENOSCOPY (EGD);  Surgeon:  Christena Deem, MD;  Location: [facility] ENDOSCOPY;  Service: Endoscopy;  Laterality: N/A;   HERNIA REPAIR     INSERT / REPLACE / REMOVE PACEMAKER  12/2005   Medtronic [id number] - placed in [place]   MANDIBLE SURGERY     TONSILLECTOMY     TOTAL SHOULDER REPLACEMENT     bilateral     Prior to Admission medications   Medication Sig Start Date End Date Taking? Authorizing Provider  predniSONE (DELTASONE) 50 MG tablet Take 1 tablet (50 mg total) by mouth daily with breakfast. 08/21/21  Yes Jene Every, MD  acetaminophen (TYLENOL) 500 MG tablet Take 500 mg by mouth every 8 (eight) hours as needed.    [provider]  apixaban (ELIQUIS) 5 MG TABS tablet Take 1 tablet (5 mg total) by mouth 2 (two) times daily. 07/18/21   Duke Salvia, MD  [facility] ZINC 50 MG TABS Take 50 mg by mouth daily.    [provider]  finasteride (PROSCAR) 5 MG tablet Take 5 mg by mouth daily. 04/03/19   [provider]  fluticasone (FLONASE) 50 MCG/ACT nasal spray Place 2 sprays into both nostrils 2 (two) times daily. 04/20/16 11/28/21  [provider]  furosemide (LASIX) 40 MG tablet Take 1 tablet (40 mg total) by mouth daily. 08/07/21   Antonieta Iba, MD  ipratropium (ATROVENT HFA) 17 MCG/ACT inhaler Inhale 2 puffs into the lungs 4 (four) times daily. 03/18/18 01/04/22  Duke Salvia, MD  lovastatin (MEVACOR)  20 MG tablet Take 1 tablet (20 mg total) by mouth at bedtime. 08/07/21   Antonieta Iba, MD  metoprolol succinate (TOPROL-XL) 50 MG 24 hr tablet Take 1 tablet (50 mg) by mouth twice daily. Take with or immediately following a meal. 08/07/21   Gollan, Tollie Pizza, MD  metoprolol tartrate (LOPRESSOR) 25 MG tablet Take 1 tablet (25 mg total) by mouth 2 (two) times daily as needed. Take as needed for palpitations, HR > 100 08/07/21 11/05/21  Antonieta Iba, MD  nitroGLYCERIN (NITROSTAT) 0.4 MG SL tablet Place 1 tablet (0.4 mg total) under the tongue every 5 (five) minutes as needed  for chest pain. 07/29/15   Antonieta Iba, MD  omeprazole (PRILOSEC) 20 MG capsule Take 20 mg by mouth daily.    [provider]  potassium chloride SA (KLOR-CON) 20 MEQ tablet Take 1 tablet (20 mEq total) by mouth daily. Take with lasix 08/07/21   Antonieta Iba, MD  tamsulosin (FLOMAX) 0.4 MG CAPS capsule Take 0.8 mg by mouth daily after breakfast.     [provider]     Allergies Amoxicillin and Bactrim [sulfamethoxazole-trimethoprim]  Family History  Problem Relation Age of Onset   Heart disease Mother    Hypertension Mother    Hyperlipidemia Mother    Heart attack Father    Heart disease Brother     Social History Social History   Tobacco Use   Smoking status: Never   Smokeless tobacco: Never  Vaping Use   Vaping Use: Never used  Substance Use Topics   Alcohol use: No    Comment: social drinker; 1 bottle of beer every 2 weeks.   Drug use: No    Review of Systems  Constitutional: No fever/chills  Respiratory: As above CV: No chest pain  Gastrointestinal: No abdominal pain.  No nausea, no vomiting.   Genitourinary: Negative for dysuria. Musculoskeletal: Negative for back pain. Skin: Negative for rash. Neurological: Negative for headaches     ____________________________________________   PHYSICAL EXAM:  VITAL SIGNS: ED Triage Vitals  Enc Vitals Group     BP 08/21/21 1340 116/89     Pulse Rate 08/21/21 1340 75     Resp 08/21/21 1340 18     Temp 08/21/21 1340 97.8 F (36.6 C)     Temp Source 08/21/21 1340 Oral     SpO2 08/21/21 1340 95 %     Weight 08/21/21 1341 72.6 kg (160 lb)     Height --      Head Circumference --      Peak Flow --      Pain Score --      Pain Loc --      Pain Edu? --      Excl. in GC? --      Constitutional: Alert and oriented.  Eyes: Conjunctivae are normal.  Head: Atraumatic. Nose: No congestion/rhinnorhea. Mouth/Throat: Mucous membranes are moist.   Cardiovascular: Normal rate, regular  rhythm.  Respiratory: Normal respiratory effort.  No retractions.  Scattered wheezes  Musculoskeletal: No lower extremity tenderness nor edema.   Neurologic:  Normal speech and language. No gross focal neurologic deficits are appreciated.   Skin:  Skin is warm, dry and intact. No rash noted.   ____________________________________________   LABS (all labs ordered are listed, but only abnormal results are displayed)  Labs Reviewed  CBC WITH DIFFERENTIAL/PLATELET - Abnormal; Notable for the following components:      Result Value   RBC 4.10 (*)  MCV 101.0 (*)    MCH 34.6 (*)    All other components within normal limits  BASIC METABOLIC PANEL   ____________________________________________  EKG   ____________________________________________  RADIOLOGY  Chest x-ray read by me, no acute abnormality ____________________________________________   PROCEDURES  Procedure(s) performed: No  Procedures   Critical Care performed: No ____________________________________________   INITIAL IMPRESSION / ASSESSMENT AND PLAN / ED COURSE  Pertinent labs & imaging results that were available during my care of the patient were reviewed by me and considered in my medical decision making (see chart for details).   Patient overall well-appearing and in no acute distress here with small amount of hemoptysis, cough.  Likely related to influenza A  Patient proved with duo nebs, prednisone, will DC with prednisone, albuterol inhaler  Chest x-ray imaging, labs are quite reassuring, return precautions discussed, patient agrees with plan   ____________________________________________   FINAL CLINICAL IMPRESSION(S) / ED DIAGNOSES  Final diagnoses:  Influenza  Hemoptysis  Bronchospasm      NEW MEDICATIONS STARTED DURING THIS VISIT:  Discharge Medication List as of 08/21/2021  9:15 PM     START taking these medications   Details  predniSONE (DELTASONE) 50 MG tablet Take 1  tablet (50 mg total) by mouth daily with breakfast., Starting Mon 08/21/2021, Normal         Note:  This document was prepared using Dragon voice recognition software and may include unintentional dictation errors.    Jene Every, MD 08/21/21 2229

## 2021-08-21 NOTE — ED Notes (Signed)
Reviewed discharge instructions, follow-up care, and prescriptions with patient. Patient verbalized understanding of all information reviewed. Patient stable, with no distress noted at this time.    

## 2021-08-21 NOTE — ED Provider Notes (Signed)
Emergency Medicine Provider Triage Evaluation Note  JAMOL GINYARD, a 85 y.o. male  was evaluated in triage.  Pt complains cough and hemoptysis.  Patient is on a course of doxycycline for recurrent pneumonia.  But denies any improvement of his symptoms.  He reports episode of hemoptysis this morning.  He denies any ongoing fevers or chest pain.  Review of Systems  Positive: Cough, hemoptysis Negative: FCS  Physical Exam  BP 116/89   Pulse 75   Temp 97.8 F (36.6 C) (Oral)   Resp 18   Wt 72.6 kg   SpO2 95%   BMI 22.32 kg/m  Gen:   Awake, no distress NAD Resp:  Normal effort CTA MSK:   Moves extremities without difficulty  Other:  CVS: RRR  Medical Decision Making  Medically screening exam initiated at 1:43 PM.  Appropriate orders placed.  DORRANCE SELLICK was informed that the remainder of the evaluation will be completed by another provider, this initial triage assessment does not replace that evaluation, and the importance of remaining in the ED until their evaluation is complete.  Geriatric patient on Eliquis for A. fib, presents to the ED with recurrent pneumonia and hemoptysis this morning.   Lissa Hoard, PA-C 08/21/21 1345    Sharman Cheek, MD 08/21/21 [phone]

## 2021-08-28 ENCOUNTER — Telehealth: Payer: Self-pay | Admitting: Cardiovascular Disease

## 2021-08-28 NOTE — Telephone Encounter (Signed)
Was able to reach back out to pt's wife, Ambrose Finland (DPR approved), she reports pt HR has been 100-109, has utilized his PRN Lopressor 25 mg PRN BID for tachycardiac and HR >100.  Chart review pt has recently dx with Pneumonia and Hemoptysis, took course of doxycycline. Wife also reports pt also feeling week and intermittent nausea. Advised d/t recent dx of PNA, underlying issues that can cause evaluation in HR, also if he still is not feeling better and still weak possible need to re-evaluated with PCP to f/u on PNA to make sure it has been clear and another infection is not emerging, also fever can cause elevated HR as well.   Suggested f/u with PCP regarding recent dx with PNA, wife reports "he finished the ABX, so the PNA is clear", advised possible need for repeat CXR, as last on from [facility] on 11/30 stated   "IMPRESSION:  Bibasilar airspace opacities (right greater than left), may represent atelectasis versus aspiration"   Was at [facility] ED (11/30) noted stated Pt presents for eval of coughing up blood since last week - pt was seen at [facility] last Monday and was rx antibiotics for pneumonia however - pt continues to have cp and is currently taking eliquis. Pt sent to ED for further eval.   But pt left while still in [place], and has not f/u since, but is not feeling better, now weak, and having intermittent nausea, and HR 100-109  Pt's wife is very concern for HR, wants to be seen by cardiologist, feels his other symptoms is not underlying issues for slight elevation in HR, has used his PRN Lopressor for HR as well, w/o much improvement. Refuse another visit to the ED or UC d/t "they are not doing anything for him, just gives ABX, and we are not waiting for hours in the [place] just to LWOT". Suggest PCP, but wife also refuses that as well, "it'll take too long to get an appt"  Advised Dr. Mariah Milling booked till Jan 6 for OV, could try another provider, wife requested Dr. Graciela Husbands, advised this type of visit  would not meet qualification for an OV w/Klein, wife finally agrees to another provider. Appt made with Cadence Fransico Michael, PA-C on Monday, 12/12 at 10:30 am. Otherwise, advise if not getting better before appt, then please seek PCP/ED/UC for f/u PNA, hemoptysis, nausea, weak, & HR. Mrs. Coopman agrees, although very frustrated as cannot be seen sooner for Mr. Putman, she thankful for the upcoming appt and return call. Will call back with any further concerns.

## 2021-08-28 NOTE — Telephone Encounter (Signed)
STAT if HR is under 50 or over 120 (normal HR is 60-100 beats per minute)    1)    What is your heart rate? This morning 109    2)    Do you have a log of your heart rate readings (document readings)? Since Thursday been over 100    3)    Do you have any other symptoms? Nausea, weak

## 2021-09-04 ENCOUNTER — Other Ambulatory Visit: Payer: Self-pay

## 2021-09-04 ENCOUNTER — Encounter: Payer: Self-pay | Admitting: Medical

## 2021-09-04 ENCOUNTER — Ambulatory Visit (INDEPENDENT_AMBULATORY_CARE_PROVIDER_SITE_OTHER): Payer: Medicare Other | Admitting: Medical

## 2021-09-04 VITALS — BP 102/78 | HR 92 | Ht 71.0 in | Wt 158.0 lb

## 2021-09-04 DIAGNOSIS — I495 Sick sinus syndrome: Secondary | ICD-10-CM | POA: Diagnosis not present

## 2021-09-04 DIAGNOSIS — I480 Paroxysmal atrial fibrillation: Secondary | ICD-10-CM

## 2021-09-04 DIAGNOSIS — Z950 Presence of cardiac pacemaker: Secondary | ICD-10-CM

## 2021-09-04 DIAGNOSIS — I2510 Atherosclerotic heart disease of native coronary artery without angina pectoris: Secondary | ICD-10-CM | POA: Diagnosis not present

## 2021-09-04 DIAGNOSIS — E782 Mixed hyperlipidemia: Secondary | ICD-10-CM

## 2021-09-04 DIAGNOSIS — J1000 Influenza due to other identified influenza virus with unspecified type of pneumonia: Secondary | ICD-10-CM

## 2021-09-04 NOTE — Progress Notes (Signed)
Cardiology Office Note:    Date:  09/05/2021   ID:  LEGION DISCHER, DOB 1936-03-03, MRN [id number]  PCP:  Katrinka Blazing, MD  [facility] Cardiologist:  Julien Nordmann, MD  [facility] Electrophysiologist:  None   Referring MD: Katrinka Blazing, MD   Chief Complaint: ER visit f/u  History of Present Illness:    Kyle Morales is a 85 y.o. male with a hx of paroxysmal Afib dating back to 1992, CAD with prior stent in 1997, sinus node dysfunction s/p pacemaker placement in [place] in 2007 for diagnosis of heart block, PPM changout at [facility], short runs of NSVT 9 seconds.   Seen 08/07/21 and reported fatigue. Dr. Graciela Husbands had reprogrammed pacer. AF burden was 5.3%.   Seen in the ER 08/21/21 for PNA and hemoptysis. Had been flu positive. He was discharged with dupnebs, prednisone, albuterol inhaler.  Saw PCP who sent him to the ER 08/23/21 at [facility] found to have PNA. Given abx.   Today, the patient reports he has not been doing too much better. Still intermittently coughing up blood, this seems to have improved since the ER visit. Tired all the time. Heart rate at home is around 100. Has also lost weight, he is throwing up every other day. Has an appetite. Has been taking Lopressor 25mg  PRN for HR>100, has taken it 3 times, stopped because it makes him nauseous. Coughing up blood occasionally, feels this has improved. Feels chest tightness. Sometimes SOB. No LLE, palpitations, orthopnea, pnd.   Past Medical History:  Diagnosis Date   CHF (congestive heart failure) (HCC)    Coronary artery disease    Eczema    Hyperlipidemia    Hypertension    Osteoarthrosis    Paroxysmal A-fib (HCC)    Presence of permanent cardiac pacemaker    S/P cardiac pacemaker procedure     Past Surgical History:  Procedure Laterality Date   CARDIAC CATHETERIZATION  1997   CARDIAC CATHETERIZATION  2000   COLONOSCOPY  2012   CORONARY ANGIOPLASTY  1997   ramus   ESOPHAGOGASTRODUODENOSCOPY N/A 02/11/2017    Procedure: ESOPHAGOGASTRODUODENOSCOPY (EGD);  Surgeon: 02/13/2017, MD;  Location: [facility] ENDOSCOPY;  Service: Endoscopy;  Laterality: N/A;   HERNIA REPAIR     INSERT / REPLACE / REMOVE PACEMAKER  12/2005   Medtronic 01/2006 H - placed in [id number]   MANDIBLE SURGERY     TONSILLECTOMY     TOTAL SHOULDER REPLACEMENT     bilateral     Current Medications: Current Meds  Medication Sig   acetaminophen (TYLENOL) 500 MG tablet Take 500 mg by mouth every 8 (eight) hours as needed.   albuterol (VENTOLIN HFA) 108 (90 Base) MCG/ACT inhaler Inhale into the lungs.   apixaban (ELIQUIS) 5 MG TABS tablet Take 1 tablet (5 mg total) by mouth 2 (two) times daily.   CVS ZINC 50 MG TABS Take 50 mg by mouth daily.   finasteride (PROSCAR) 5 MG tablet Take 5 mg by mouth daily.   fluticasone (FLONASE) 50 MCG/ACT nasal spray Place 2 sprays into both nostrils 2 (two) times daily.   furosemide (LASIX) 40 MG tablet Take 1 tablet (40 mg total) by mouth daily. (Patient taking differently: Take 120 mg by mouth daily.)   ipratropium (ATROVENT HFA) 17 MCG/ACT inhaler Inhale 2 puffs into the lungs 4 (four) times daily.   lovastatin (MEVACOR) 20 MG tablet Take 1 tablet (20 mg total) by mouth at bedtime.   metoprolol succinate (TOPROL-XL) 50  MG 24 hr tablet Take 1 tablet (50 mg) by mouth twice daily. Take with or immediately following a meal.   metoprolol tartrate (LOPRESSOR) 25 MG tablet Take 1 tablet (25 mg total) by mouth 2 (two) times daily as needed. Take as needed for palpitations, HR > 100   nitroGLYCERIN (NITROSTAT) 0.4 MG SL tablet Place 1 tablet (0.4 mg total) under the tongue every 5 (five) minutes as needed for chest pain.   omeprazole (PRILOSEC) 20 MG capsule Take 20 mg by mouth daily.   potassium chloride SA (KLOR-CON) 20 MEQ tablet Take 1 tablet (20 mEq total) by mouth daily. Take with lasix   predniSONE (DELTASONE) 50 MG tablet Take 1 tablet (50 mg total) by mouth daily with breakfast.   tamsulosin  (FLOMAX) 0.4 MG CAPS capsule Take 0.8 mg by mouth daily after breakfast.      Allergies:   Amoxicillin and Bactrim [sulfamethoxazole-trimethoprim]   Social History   Socioeconomic History   Marital status: Married    Spouse name: Not on file   Number of children: Not on file   Years of education: Not on file   Highest education level: Not on file  Occupational History   Not on file  Tobacco Use   Smoking status: Never   Smokeless tobacco: Never  Vaping Use   Vaping Use: Never used  Substance and Sexual Activity   Alcohol use: No    Comment: social drinker; 1 bottle of beer every 2 weeks.   Drug use: No   Sexual activity: Not on file  Other Topics Concern   Not on file  Social History Narrative   Not on file   Social Determinants of Health   Financial Resource Strain: Not on file  Food Insecurity: Not on file  Transportation Needs: Not on file  Physical Activity: Not on file  Stress: Not on file  Social Connections: Not on file     Family History: The patient's family history includes Heart attack in his father; Heart disease in his brother and mother; Hyperlipidemia in his mother; Hypertension in his mother.  ROS:   Please see the history of present illness.     All other systems reviewed and are negative.  EKGs/Labs/Other Studies Reviewed:    The following studies were reviewed today:  Echo 2018 Study Conclusions   - Left ventricle: The cavity size was normal. There was moderate    concentric hypertrophy. Systolic function was normal. The    estimated ejection fraction was in the range of 50% to 55%. Wall    motion was normal; there were no regional wall motion    abnormalities. The study is not technically sufficient to allow    evaluation of LV diastolic function.  - Aorta: Aortic root dimension: 39 mm (ED).  - Mitral valve: There was mild to moderate regurgitation.  - Left atrium: The atrium was severely dilated.  - Pulmonary arteries: Systolic  pressure could not be accurately    estimated.   EKG:  EKG is  ordered today.  The ekg ordered today demonstrates Aflutter, 92bpm, PVCs, RBBB, old T wave changes lateral leads  Recent Labs: 03/14/2021: TSH 2.010 08/21/2021: BUN 17; Creatinine, Ser 0.76; Hemoglobin 14.2; Platelets 185; Potassium 3.6; Sodium 137  Recent Lipid Panel    Component Value Date/Time   CHOL 136 02/20/2017 0850   CHOL 92 06/15/2014 1119   TRIG 172 (H) 02/20/2017 0850   TRIG 144 06/15/2014 1119   HDL 32 (L) 02/20/2017 1941  HDL 24 (L) 06/15/2014 1119   CHOLHDL 4.3 02/20/2017 0850   VLDL 29 06/15/2014 1119   LDLCALC 70 02/20/2017 0850   LDLCALC 39 06/15/2014 1119    Physical Exam:    VS:  BP 102/78 (BP Location: Left Arm, Patient Position: Sitting, Cuff Size: Normal)   Pulse 92   Ht 5\' 11"  (1.803 m)   Wt 158 lb (71.7 kg)   SpO2 98%   BMI 22.04 kg/m     Wt Readings from Last 3 Encounters:  09/04/21 158 lb (71.7 kg)  08/21/21 160 lb (72.6 kg)  08/07/21 165 lb 6 oz (75 kg)     GEN:  Well nourished, well developed in no acute distress HEENT: Normal NECK: No JVD; No carotid bruits LYMPHATICS: No lymphadenopathy CARDIAC: reg irreg, no murmurs, rubs, gallops RESPIRATORY:  Clear to auscultation without rales, wheezing or rhonchi  ABDOMEN: Soft, non-tender, non-distended MUSCULOSKELETAL:  No edema; No deformity  SKIN: Warm and dry NEUROLOGIC:  Alert and oriented x 3 PSYCHIATRIC:  Normal affect   ASSESSMENT:    1. Atherosclerosis of native coronary artery of native heart without angina pectoris   2. Paroxysmal atrial fibrillation (HCC)   3. Cardiac pacemaker in situ   4. Sinus node dysfunction (HCC)   5. Mixed hyperlipidemia   6. Coronary artery disease involving native coronary artery of native heart without angina pectoris   7. Pneumonia due to influenza A virus    PLAN:    In order of problems listed above:  Paroxysmal Afib/flutter Patient reports elevated heart rates around 100 since  Flu/PNA. He has no palpitations, but seems to feel a tightness in his chest. EKG appears to shows Aflutter with HR 92bpm, reviewed with DOD. He is on Toprol-XL 50mg  BID, BP borderline, cannot increase BB. Has been on amiodarone remotely, unsure if he tolerated it. Can also consider cardioversion. Will discuss with Dr. 08/09/21 best option for the patient.   NSVT s/p PPM Follows with Dr. .  Dyslipidemia LDL 75, Continue statin.   CAD No anginal symptoms reported. Continue statin and BB.No ASA given Eliquis. No further ischemic work-up at this time.    LLE NO LLE on exam. Continue current dose lasix  FLU PNA Hemoptysis Recently treated for Flu/PNA in the ER. Reports hemoptysis is improving. Caution on Eliquis, call if this does no improve.    Disposition: Follow up in 1 month(s) with MD      Signed,  Graciela Husbands, PA-C  09/05/2021 9:31 AM    [facility]

## 2021-09-04 NOTE — Patient Instructions (Signed)
Medication Instructions:  Your physician recommends that you continue on your current medications as directed. Please refer to the Current Medication list given to you today.  *If you need a refill on your cardiac medications before your next appointment, please call your pharmacy*   Lab Work: None ordered If you have labs (blood work) drawn today and your tests are completely normal, you will receive your results only by: MyChart Message (if you have MyChart) OR A paper copy in the mail If you have any lab test that is abnormal or we need to change your treatment, we will call you to review the results.   Testing/Procedures: None ordered   Follow-Up: At [facility], you and your health needs are our priority.  As part of our continuing mission to provide you with exceptional heart care, we have created designated Provider Care Teams.  These Care Teams include your primary Cardiologist (physician) and Advanced Practice Providers (APPs -  Physician Assistants and Nurse Practitioners) who all work together to provide you with the care you need, when you need it.  We recommend signing up for the patient portal called "MyChart".  Sign up information is provided on this After Visit Summary.  MyChart is used to connect with patients for Virtual Visits (Telemedicine).  Patients are able to view lab/test results, encounter notes, upcoming appointments, etc.  Non-urgent messages can be sent to your provider as well.   To learn more about what you can do with MyChart, go to [email or web address].    Your next appointment:   4 week(s)  The format for your next appointment:   In Person  Provider:   Sherryl Manges, MD    Other Instructions N/A

## 2021-09-08 ENCOUNTER — Ambulatory Visit (INDEPENDENT_AMBULATORY_CARE_PROVIDER_SITE_OTHER): Payer: Medicare Other

## 2021-09-08 ENCOUNTER — Encounter: Payer: Self-pay | Admitting: Emergency Medicine

## 2021-09-08 ENCOUNTER — Ambulatory Visit
Admission: EM | Admit: 2021-09-08 | Discharge: 2021-09-08 | Disposition: A | Discharge status: Home / Self Care | Payer: Medicare Other | Attending: Emergency Medicine | Admitting: Emergency Medicine

## 2021-09-08 ENCOUNTER — Other Ambulatory Visit: Payer: Self-pay

## 2021-09-08 DIAGNOSIS — R0782 Intercostal pain: Secondary | ICD-10-CM | POA: Diagnosis not present

## 2021-09-08 DIAGNOSIS — M25551 Pain in right hip: Secondary | ICD-10-CM

## 2021-09-08 DIAGNOSIS — W19XXXA Unspecified fall, initial encounter: Secondary | ICD-10-CM

## 2021-09-08 DIAGNOSIS — R52 Pain, unspecified: Secondary | ICD-10-CM

## 2021-09-08 DIAGNOSIS — R0781 Pleurodynia: Secondary | ICD-10-CM

## 2021-09-08 NOTE — Discharge Instructions (Addendum)
Your xrays are negative.  Follow up with your primary care provider if your symptoms are not improving.
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
## 2021-09-08 NOTE — ED Provider Notes (Signed)
Kyle Morales    CSN: [id number] Arrival date & time: 09/08/21  1545      History   Chief Complaint Chief Complaint  Patient presents with   Fall    HPI Kyle Morales is a 85 y.o. male.  Accompanied by his wife, patient presents with right rib pain and right hip pain after falling today.  He stood up and his right leg was "asleep" so he fell into a piece of furniture.  No head injury or loss of consciousness.  No focal weakness, chest pain, shortness of breath, abdominal pain, or other symptoms.  He had right hip replacement in March 2022.  He had pneumonia 1 month ago.  Patient is on Eliquis. His medical history includes atrial fibrillation, pacemaker, hypertension, heart failure.   The history is provided by the patient, the spouse and medical records.   Past Medical History:  Diagnosis Date   CHF (congestive heart failure) (HCC)    Coronary artery disease    Eczema    Hyperlipidemia    Hypertension    Osteoarthrosis    Paroxysmal A-fib (HCC)    Presence of permanent cardiac pacemaker    S/P cardiac pacemaker procedure     Patient Active Problem List   Diagnosis Date Noted   Acute bronchitis 07/29/2015   Chronic diastolic CHF (congestive heart failure) ([place]) 01/28/2015   CHF (congestive heart failure) ([place])    Coronary artery disease    Diarrhea 07/30/2014   Encounter for therapeutic drug monitoring 07/14/2014   Orthostatic hypotension 04/28/2014   Coronary atherosclerosis of native coronary artery 03/12/2013   SOB (shortness of breath) 03/12/2013   A-fib ([place]) 03/12/2013   Hyperlipidemia 03/12/2013   Cardiac pacemaker in situ 03/12/2013   Essential hypertension 03/12/2013    Past Surgical History:  Procedure Laterality Date   [place]  2000   COLONOSCOPY  2012   CORONARY ANGIOPLASTY  1997   ramus   ESOPHAGOGASTRODUODENOSCOPY N/A 02/11/2017   Procedure: ESOPHAGOGASTRODUODENOSCOPY (EGD);  Surgeon:  Lollie Sails, MD;  Location: [facility] ENDOSCOPY;  Service: Endoscopy;  Laterality: N/A;   HERNIA REPAIR     INSERT / REPLACE / REMOVE PACEMAKER  12/2005   Medtronic [id number] H - placed in [place]     bilateral        Home Medications    Prior to Admission medications   Medication Sig Start Date End Date Taking? Authorizing Provider  acetaminophen (TYLENOL) 500 MG tablet Take 500 mg by mouth every 8 (eight) hours as needed.    [provider]  albuterol (VENTOLIN HFA) 108 (90 Base) MCG/ACT inhaler Inhale into the lungs. 08/14/21 08/14/22  [provider]  apixaban (ELIQUIS) 5 MG TABS tablet Take 1 tablet (5 mg total) by mouth 2 (two) times daily. 07/18/21   Deboraha Sprang, MD  [facility] ZINC 50 MG TABS Take 50 mg by mouth daily.    [provider]  finasteride (PROSCAR) 5 MG tablet Take 5 mg by mouth daily. 04/03/19   [provider]  fluticasone (FLONASE) 50 MCG/ACT nasal spray Place 2 sprays into both nostrils 2 (two) times daily. 04/20/16 11/28/21  [provider]  furosemide (LASIX) 40 MG tablet Take 1 tablet (40 mg total) by mouth daily. Patient taking differently: Take 120 mg by mouth daily. 08/07/21   Minna Merritts, MD  ipratropium (ATROVENT HFA)  17 MCG/ACT inhaler Inhale 2 puffs into the lungs 4 (four) times daily. 03/18/18 01/04/22  Deboraha Sprang, MD  lovastatin (MEVACOR) 20 MG tablet Take 1 tablet (20 mg total) by mouth at bedtime. 08/07/21   Minna Merritts, MD  metoprolol succinate (TOPROL-XL) 50 MG 24 hr tablet Take 1 tablet (50 mg) by mouth twice daily. Take with or immediately following a meal. 08/07/21   Gollan, Kathlene November, MD  metoprolol tartrate (LOPRESSOR) 25 MG tablet Take 1 tablet (25 mg total) by mouth 2 (two) times daily as needed. Take as needed for palpitations, HR > 100 08/07/21 11/05/21  Minna Merritts, MD  nitroGLYCERIN (NITROSTAT) 0.4 MG SL tablet  Place 1 tablet (0.4 mg total) under the tongue every 5 (five) minutes as needed for chest pain. 07/29/15   Minna Merritts, MD  omeprazole (PRILOSEC) 20 MG capsule Take 20 mg by mouth daily.    [provider]  potassium chloride SA (KLOR-CON) 20 MEQ tablet Take 1 tablet (20 mEq total) by mouth daily. Take with lasix 08/07/21   Minna Merritts, MD  predniSONE (DELTASONE) 50 MG tablet Take 1 tablet (50 mg total) by mouth daily with breakfast. 08/21/21   Lavonia Drafts, MD  tamsulosin (FLOMAX) 0.4 MG CAPS capsule Take 0.8 mg by mouth daily after breakfast.     [provider]    Family History Family History  Problem Relation Age of Onset   Heart disease Mother    Hypertension Mother    Hyperlipidemia Mother    Heart attack Father    Heart disease Brother     Social History Social History   Tobacco Use   Smoking status: Never   Smokeless tobacco: Never  Vaping Use   Vaping Use: Never used  Substance Use Topics   Alcohol use: No    Comment: social drinker; 1 bottle of beer every 2 weeks.   Drug use: No     Allergies   Amoxicillin and Bactrim [sulfamethoxazole-trimethoprim]   Review of Systems Review of Systems  Constitutional:  Negative for chills and fever.  Respiratory:  Negative for cough and shortness of breath.   Cardiovascular:  Negative for chest pain and palpitations.  Gastrointestinal:  Negative for abdominal pain and vomiting.  Musculoskeletal:  Positive for arthralgias.       Right rib pain. Right hip pain.   Skin:  Positive for rash. Negative for color change.  Neurological:  Negative for weakness and numbness.  All other systems reviewed and are negative.   Physical Exam Triage Vital Signs ED Triage Vitals  Enc Vitals Group     BP      Pulse      Resp      Temp      Temp src      SpO2      Weight      Height      Head Circumference      Peak Flow      Pain Score      Pain Loc      Pain Edu?      Excl. in [place]?    No data  found.  Updated Vital Signs BP 106/72 (BP Location: Left Arm)    Pulse 98    Temp 98.2 F (36.8 C)    Resp 18    SpO2 96%   Visual Acuity Right Eye Distance:   Left Eye Distance:   Bilateral Distance:    Right Eye Near:  Left Eye Near:    Bilateral Near:     Physical Exam Vitals and nursing note reviewed.  Constitutional:      General: He is not in acute distress.    Appearance: He is well-developed.  HENT:     Mouth/Throat:     Mouth: Mucous membranes are moist.  Cardiovascular:     Rate and Rhythm: Normal rate and regular rhythm.     Heart sounds: Normal heart sounds.  Pulmonary:     Effort: Pulmonary effort is normal. No respiratory distress.     Breath sounds: Normal breath sounds.  Abdominal:     Palpations: Abdomen is soft.     Tenderness: There is no abdominal tenderness. There is no guarding or rebound.  Musculoskeletal:        General: Tenderness present. No swelling or deformity. Normal range of motion.       Arms:     Cervical back: Neck supple.     Comments: Right posterior ribcage tender. Right hip tender.   Skin:    General: Skin is warm and dry.     Capillary Refill: Capillary refill takes less than 2 seconds.     Comments: Light red abrasion on right posterior ribcage.   Neurological:     General: No focal deficit present.     Mental Status: He is alert and oriented to person, place, and time.     Sensory: No sensory deficit.     Motor: No weakness.     Gait: Gait abnormal.     Comments: Ambulatory with cane.  Psychiatric:        Mood and Affect: Mood normal.        Behavior: Behavior normal.     UC Treatments / Results  Labs (all labs ordered are listed, but only abnormal results are displayed) Labs Reviewed - No data to display  EKG   Radiology DG Ribs Unilateral W/Chest Right  Result Date: 09/08/2021 CLINICAL DATA:  Right-sided rib pain after fall. EXAM: RIGHT RIBS AND CHEST - 3+ VIEW COMPARISON:  Chest x-ray dated August 21, 2021. FINDINGS: No fracture or other bone lesions are seen involving the ribs. There is no evidence of pneumothorax or pleural effusion. Both lungs are clear. Heart size and mediastinal contours are within normal limits. Unchanged left chest wall pacemaker. Unchanged elevation of the right hemidiaphragm. IMPRESSION: Negative. Electronically Signed   By: Obie Dredge M.D.   On: 09/08/2021 17:15   DG Hip Unilat With Pelvis 2-3 Views Right  Result Date: 09/08/2021 CLINICAL DATA:  Right hip pain after a fall. EXAM: DG HIP (WITH OR WITHOUT PELVIS) 2-3V RIGHT COMPARISON:  CT abdomen and pelvis dated September 23, 2014. FINDINGS: No acute fracture or dislocation. Prior right total hip arthroplasty. No evidence of hardware failure or loosening. Mild degenerative changes of the left hip joint. The pubic symphysis and sacroiliac joints are unremarkable. Soft tissues are unremarkable. IMPRESSION: 1. No acute osseous abnormality. Electronically Signed   By: Obie Dredge M.D.   On: 09/08/2021 17:19    Procedures Procedures (including critical care time)  Medications Ordered in UC Medications - No data to display  Initial Impression / Assessment and Plan / UC Course  I have reviewed the triage vital signs and the nursing notes.  Pertinent labs & imaging results that were available during my care of the patient were reviewed by me and considered in my medical decision making (see chart for details).   Right rib pain and  right hip pain s/p fall.  Chest xay negative. Hip xray negative.  Discussed Tylenol as needed for discomfort.  Education provided on hip pain, rib contusion, fall prevention in the home.  Recent history of pneumonia; Discussed deep breathing and use of incentive spirometer for prevention of pneumonia.  Instructed patient to follow-up with his PCP if his symptoms are not improving.  He agrees to plan of care.   Final Clinical Impressions(s) / UC Diagnoses   Final diagnoses:  Pain  Fall   Rib pain on right side  Right hip pain  Fall, initial encounter     Discharge Instructions      Your xrays are negative.  Follow up with your primary care provider if your symptoms are not improving.         ED Prescriptions   None    PDMP not reviewed this encounter.   Sharion Balloon, NP 09/08/21 1730

## 2021-09-11 ENCOUNTER — Other Ambulatory Visit: Payer: Self-pay | Admitting: Medical

## 2021-09-11 ENCOUNTER — Telehealth: Payer: Self-pay

## 2021-09-11 ENCOUNTER — Telehealth: Payer: Self-pay | Admitting: Emergency Medicine

## 2021-09-11 NOTE — Telephone Encounter (Signed)
LVM on patients home phone regarding Dr. Koren Bound request to send a manual transmission from his home monitor to verify patient is in atrial fib

## 2021-09-11 NOTE — Telephone Encounter (Signed)
-----   Message from Duke Salvia, MD sent at 09/11/2021 10:15 AM EST ----- Cadence-- would pursue cardioversion-- will get a transmission to confirm still out of rhythm as he has been in and out over the last year E2 and E, can we do transmission  Thanks SK     ----- Message ----- From: Marianne Sofia, PA-C Sent: 09/06/2021  10:33 AM EST To: Duke Salvia, MD, Antonieta Iba, MD  ER follow-up PNA/Flu reports elevated HR. EKG with possible aflutter. Cannot increase BB with borderline BP. Please advise AAA vs cardioversion. Has been on amio in the past, unsure if he tolerated. Reviewed with MD, recommended to ask EP.

## 2021-09-11 NOTE — Telephone Encounter (Signed)
Called patient to discuss cardioversion, ask about anticoagulation, and get cardioversion scheduled. No answer, lmtcb.

## 2021-09-11 NOTE — Telephone Encounter (Signed)
-----   Message from Cadence David Stall, PA-C sent at 09/11/2021 10:24 AM EST ----- Regarding: Needs cardioversion Hey! can we call the patient and explain that Dr. Graciela Husbands recommended cardioversion. Can we confirm patient has not missed doses of anticoagulation and set him up for cardioversion?  Also, please let me know if I need to do anything (like call and consent patient or orders). Thanks   ----- Message ----- From: Duke Salvia, MD Sent: 09/11/2021  10:17 AM EST To: Lenor Coffin, RN, Dorathy Daft, RN, #  Cadence-- would pursue cardioversion-- will get a transmission to confirm still out of rhythm as he has been in and out over the last year E2 and E, can we do transmission  Thanks SK     ----- Message ----- From: Marianne Sofia, PA-C Sent: 09/06/2021  10:33 AM EST To: Duke Salvia, MD, Antonieta Iba, MD  ER follow-up PNA/Flu reports elevated HR. EKG with possible aflutter. Cannot increase BB with borderline BP. Please advise AAA vs cardioversion. Has been on amio in the past, unsure if he tolerated. Reviewed with MD, recommended to ask EP.

## 2021-09-12 NOTE — Telephone Encounter (Signed)
-----   Message from Duke Salvia, MD sent at 09/11/2021 10:15 AM EST ----- Cadence-- would pursue cardioversion-- will get a transmission to confirm still out of rhythm as he has been in and out over the last year E2 and E, can we do transmission  Thanks SK     ----- Message ----- From: Marianne Sofia, PA-C Sent: 09/06/2021  10:33 AM EST To: Duke Salvia, MD, Antonieta Iba, MD  ER follow-up PNA/Flu reports elevated HR. EKG with possible aflutter. Cannot increase BB with borderline BP. Please advise AAA vs cardioversion. Has been on amio in the past, unsure if he tolerated. Reviewed with MD, recommended to ask EP.

## 2021-09-12 NOTE — Telephone Encounter (Signed)
Transmission received 09/12/2021. I told the patient wife that the nurse will give him a call back after she review it.

## 2021-09-12 NOTE — Telephone Encounter (Signed)
Patient called and advised I will forward to Dr. Graciela Husbands for review.

## 2021-09-12 NOTE — Telephone Encounter (Signed)
Called pt's wife back.   Wife reports that patient was in the ER at [facility] yesterday due to ongoing pain following fall last Friday. Reports that they did a CT of pt's right hip and found trochanter fx.   Explained to pt's wife that I was calling yesterday to set up cardioversion at Dr. Odessa Fleming recc. Wife reports that pt seems to no longer be in a. Fib. At time of phone call, pt was in other room doing a transmission.   Told wife that I would call her back with POC once transmission was received by [facility] office and reviewed.   Wife verbalized understanding and voiced appreciation for call.

## 2021-09-12 NOTE — Telephone Encounter (Signed)
Patient wife returning call  °

## 2021-09-13 NOTE — Addendum Note (Signed)
Addended by: Marianne Sofia on: 09/13/2021 10:28 AM   Modules accepted: Orders

## 2021-09-26 ENCOUNTER — Other Ambulatory Visit: Payer: Self-pay | Admitting: Internal Medicine

## 2021-09-26 DIAGNOSIS — R1313 Dysphagia, pharyngeal phase: Secondary | ICD-10-CM

## 2021-09-26 DIAGNOSIS — Z7901 Long term (current) use of anticoagulants: Secondary | ICD-10-CM | POA: Insufficient documentation

## 2021-09-26 DIAGNOSIS — K4021 Bilateral inguinal hernia, without obstruction or gangrene, recurrent: Secondary | ICD-10-CM | POA: Insufficient documentation

## 2021-09-28 NOTE — Telephone Encounter (Signed)
The patient is scheduled to follow up with Dr. Graciela Husbands on 10/03/21 in [place].

## 2021-10-03 ENCOUNTER — Encounter: Payer: Self-pay | Admitting: Internal Medicine

## 2021-10-03 ENCOUNTER — Other Ambulatory Visit: Payer: Self-pay

## 2021-10-03 ENCOUNTER — Ambulatory Visit (INDEPENDENT_AMBULATORY_CARE_PROVIDER_SITE_OTHER): Payer: Medicare Other | Admitting: Internal Medicine

## 2021-10-03 VITALS — BP 102/68 | HR 73 | Ht 71.0 in | Wt 167.8 lb

## 2021-10-03 DIAGNOSIS — Z950 Presence of cardiac pacemaker: Secondary | ICD-10-CM | POA: Diagnosis not present

## 2021-10-03 DIAGNOSIS — I5032 Chronic diastolic (congestive) heart failure: Secondary | ICD-10-CM

## 2021-10-03 DIAGNOSIS — I495 Sick sinus syndrome: Secondary | ICD-10-CM

## 2021-10-03 DIAGNOSIS — I480 Paroxysmal atrial fibrillation: Secondary | ICD-10-CM | POA: Diagnosis not present

## 2021-10-03 LAB — PACEMAKER DEVICE OBSERVATION

## 2021-10-03 NOTE — Progress Notes (Signed)
Patient ID: Kyle Morales, male   DOB: 04/18/1936, 86 y.o.   MRN: [id number]      Patient Care Team: Katrinka Blazing, MD as PCP - General Ferdinand Cava, MD as PCP - Family Medicine Gollan, Tollie Pizza, MD as PCP - Cardiology (Cardiology) Antonieta Iba, MD as Consulting Physician (Cardiology)   HPI  Kyle Morales is a 86 y.o. male Seen in  follow-up for Medtronic pacemaker implanted for sinus node dysfunction remotely w gen change 2/17  Also atrial fibrillation dating at least back to 2014  for which he had been treated with amiodarone and warfarin.  Now on apixoban and no Anti-arrhythmic drugs   History of coronary artery disease with prior stenting remotely; interval hip [facility] 3/22.  Developed tachycardia.  Cardiology reported "atrial tachycardia "treated with calcium blockers and metoprolol.  Problems with recurrent atrial fibrillation with 3 prolonged episodes over the last 8 months the longest of which lasted about 3 weeks--his most recent episode of atrial fibrillation occurred in the context of pneumonia.  It is his impression and that of his wife that he has no significant symptoms when he is in atrial fibrillation.  His biggest complaint is fatigue.  Denies chest pain or shortness of breath.  No edema.  Anticoagulation w Apixoban, without bleeding         DATE TEST EF%   6/14 Myoview    65 %   6/15 Myoview    6/18 Echo 50%   6/19 CTA  Aorta 3.7 cm   4/22 Echo (CE)  55-60%    Date Cr K Hgb  8/18  1.11 4.2    5/20 0.87 4.3 15.2  12/21 0.89  15.3  4/22 0.83 3.8 11.6  11/22 0.76 3.6 14.2    Past Medical History:  Diagnosis Date   CHF (congestive heart failure) (HCC)    Coronary artery disease    Eczema    Hyperlipidemia    Hypertension    Osteoarthrosis    Paroxysmal A-fib (HCC)    Presence of permanent cardiac pacemaker    S/P cardiac pacemaker procedure     Past Surgical History:  Procedure Laterality Date   CARDIAC CATHETERIZATION  1997    CARDIAC CATHETERIZATION  2000   COLONOSCOPY  2012   CORONARY ANGIOPLASTY  1997   ramus   ESOPHAGOGASTRODUODENOSCOPY N/A 02/11/2017   Procedure: ESOPHAGOGASTRODUODENOSCOPY (EGD);  Surgeon: Christena Deem, MD;  Location: [facility] ENDOSCOPY;  Service: Endoscopy;  Laterality: N/A;   HERNIA REPAIR     INSERT / REPLACE / REMOVE PACEMAKER  12/2005   Medtronic [id number] H - placed in [place]   MANDIBLE SURGERY     TONSILLECTOMY     TOTAL SHOULDER REPLACEMENT     bilateral     Current Outpatient Medications  Medication Sig Dispense Refill   acetaminophen (TYLENOL) 500 MG tablet Take 500 mg by mouth every 8 (eight) hours as needed.     albuterol (VENTOLIN HFA) 108 (90 Base) MCG/ACT inhaler Inhale into the lungs.     apixaban (ELIQUIS) 5 MG TABS tablet Take 1 tablet (5 mg total) by mouth 2 (two) times daily. 180 tablet 1   [facility] ZINC 50 MG TABS Take 50 mg by mouth daily.     finasteride (PROSCAR) 5 MG tablet Take 5 mg by mouth daily.     fluticasone (FLONASE) 50 MCG/ACT nasal spray Place 2 sprays into both nostrils 2 (two) times daily.     furosemide (LASIX) 40 MG  tablet Take by mouth. 3 tablets daily and may take 4 tablets if needed     ipratropium (ATROVENT HFA) 17 MCG/ACT inhaler Inhale 2 puffs into the lungs 4 (four) times daily. 1 Inhaler 0   lovastatin (MEVACOR) 20 MG tablet Take 1 tablet (20 mg total) by mouth at bedtime. 90 tablet 3   metoprolol succinate (TOPROL-XL) 50 MG 24 hr tablet Take 1 tablet (50 mg) by mouth twice daily. Take with or immediately following a meal. 180 tablet 3   metoprolol tartrate (LOPRESSOR) 25 MG tablet Take 1 tablet (25 mg total) by mouth 2 (two) times daily as needed. Take as needed for palpitations, HR > 100 180 tablet 3   nitroGLYCERIN (NITROSTAT) 0.4 MG SL tablet Place 1 tablet (0.4 mg total) under the tongue every 5 (five) minutes as needed for chest pain. 25 tablet 1   omeprazole (PRILOSEC) 20 MG capsule Take 20 mg by mouth as needed.     potassium chloride SA  (KLOR-CON) 20 MEQ tablet Take 1 tablet (20 mEq total) by mouth daily. Take with lasix 90 tablet 3   tamsulosin (FLOMAX) 0.4 MG CAPS capsule Take 0.8 mg by mouth daily after breakfast.      predniSONE (DELTASONE) 50 MG tablet Take 1 tablet (50 mg total) by mouth daily with breakfast. (Patient not taking: Reported on 10/03/2021) 4 tablet 0   No current facility-administered medications for this visit.    Allergies  Allergen Reactions   Amoxicillin Rash and Itching   Bactrim [Sulfamethoxazole-Trimethoprim] Rash   Review of Systems negative except from HPI and PMH  Physical Exam:  BP 102/68 (BP Location: Left Arm, Patient Position: Sitting, Cuff Size: Normal)    Pulse 73    Ht 5\' 11"  (1.803 m)    Wt 167 lb 12.8 oz (76.1 kg)    SpO2 96%    BMI 23.40 kg/m   Well developed and well nourished in no acute distress HENT normal Neck supple with JVP-flat Clear Device pocket well healed; without hematoma or erythema.  There is no tethering  Regular rate and rhythm, no  murmur Abd-soft with active BS No Clubbing cyanosis  edema Skin-warm and dry A & Oriented  Grossly normal sensory and motor function  ECG atrial pacing at 73 Intervals 25/12/43 Right bundle branch block-incomplete     Assessment and  Plan Atrial fibrillation-paroxysmal  Sinus node dysfunction  Pacemaker-Medtronic       Cardiomyopathy  Ectatic Aorta  Fatigue  HFpEF   VT NS   Continues to complain of fatigue.  I wonder whether it is the metoprolol.  We will wean him off for a 3-week trial and then make a decision about resuming.  He also had fatigue last year related to device reprogramming decrease his metoprolol from 50 twice daily--25 twice daily x5 days and 25 daily x5 days and then discontinue x10 days .  blood pressure remains low.  I wonder if part of his fatigue is not related to this and whether he would be better off with a blood pressure of 120+.  It may be that coming off metoprolol will accomplish both  of these things  Blood pressure today is low.  Anomalously.  Advised him to let 27/12/43 know if he is having blood pressures in the 100 range. Atrial fibrillation seems to be asymptomatic.  For now we will follow without specific therapies.  Continue apixaban 5 mg twice daily.  He is euvolemic.  We will continue him on furosemide 120  daily

## 2021-10-03 NOTE — Patient Instructions (Addendum)
Medication Instructions:  - Your physician has recommended you make the following change in your medication:   1) Metoprolol succinate 50 mg: - Decrease to 0.5 tablet (25 mg) by mouth TWICE daily x 5 days, then - Decrease to 0.5 tablet (25 mg) by mouth ONCE daily x 5 days, then - Stop  2) In 3 weeks: - if you ARE NO less tired, please resume metoprolol succinate 50 mg twice daily, just call the office so we know you are back on this [phone]  - if you ARE less tired off metoprolol call the office at [phone] and Dr. Graciela Husbands will review to see about further medication changes  *If you need a refill on your cardiac medications before your next appointment, please call your pharmacy*   Lab Work: - none ordered  If you have labs (blood work) drawn today and your tests are completely normal, you will receive your results only by: MyChart Message (if you have MyChart) OR A paper copy in the mail If you have any lab test that is abnormal or we need to change your treatment, we will call you to review the results.   Testing/Procedures: - none ordered   Follow-Up: At [facility], you and your health needs are our priority.  As part of our continuing mission to provide you with exceptional heart care, we have created designated Provider Care Teams.  These Care Teams include your primary Cardiologist (physician) and Advanced Practice Providers (APPs -  Physician Assistants and Nurse Practitioners) who all work together to provide you with the care you need, when you need it.  We recommend signing up for the patient portal called "MyChart".  Sign up information is provided on this After Visit Summary.  MyChart is used to connect with patients for Virtual Visits (Telemedicine).  Patients are able to view lab/test results, encounter notes, upcoming appointments, etc.  Non-urgent messages can be sent to your provider as well.   To learn more about what you can do with MyChart, go to  [email or web address].    Your next appointment:   6 month(s)  The format for your next appointment:   In Person  Provider:   Sherryl Manges, MD    Other Instructions N/a

## 2021-10-04 ENCOUNTER — Ambulatory Visit
Admission: RE | Admit: 2021-10-04 | Discharge: 2021-10-04 | Disposition: A | Discharge status: Home / Self Care | Payer: Medicare Other | Source: Ambulatory Visit | Attending: Internal Medicine | Admitting: Internal Medicine

## 2021-10-04 DIAGNOSIS — R1313 Dysphagia, pharyngeal phase: Secondary | ICD-10-CM | POA: Diagnosis present

## 2021-10-31 ENCOUNTER — Ambulatory Visit (INDEPENDENT_AMBULATORY_CARE_PROVIDER_SITE_OTHER): Payer: Medicare Other

## 2021-10-31 DIAGNOSIS — I495 Sick sinus syndrome: Secondary | ICD-10-CM | POA: Diagnosis not present

## 2021-10-31 LAB — CUP PACEART REMOTE DEVICE CHECK
Battery Remaining Longevity: 49 mo
Battery Voltage: 2.98 V
Brady Statistic AP VP Percent: 0.4 %
Brady Statistic AP VS Percent: 75.03 %
Brady Statistic AS VP Percent: 0.07 %
Brady Statistic AS VS Percent: 24.5 %
Brady Statistic RA Percent Paced: 75.03 %
Brady Statistic RV Percent Paced: 0.53 %
Date Time Interrogation Session: 20230207092038
Implantable Lead Implant Date: 20070411
Implantable Lead Implant Date: 20070411
Implantable Lead Location: 753859
Implantable Lead Location: 753860
Implantable Lead Model: 5076
Implantable Lead Model: 5076
Implantable Pulse Generator Implant Date: 20170224
Lead Channel Impedance Value: 323 Ohm
Lead Channel Impedance Value: 323 Ohm
Lead Channel Impedance Value: 380 Ohm
Lead Channel Impedance Value: 399 Ohm
Lead Channel Pacing Threshold Amplitude: 0.625 V
Lead Channel Pacing Threshold Amplitude: 1.375 V
Lead Channel Pacing Threshold Pulse Width: 0.4 ms
Lead Channel Pacing Threshold Pulse Width: 0.4 ms
Lead Channel Sensing Intrinsic Amplitude: 1.875 mV
Lead Channel Sensing Intrinsic Amplitude: 1.875 mV
Lead Channel Sensing Intrinsic Amplitude: 3.875 mV
Lead Channel Sensing Intrinsic Amplitude: 3.875 mV
Lead Channel Setting Pacing Amplitude: 2 V
Lead Channel Setting Pacing Amplitude: 2.75 V
Lead Channel Setting Pacing Pulse Width: 0.4 ms
Lead Channel Setting Sensing Sensitivity: 0.9 mV

## 2021-11-01 ENCOUNTER — Telehealth: Payer: Self-pay

## 2021-11-01 NOTE — Telephone Encounter (Signed)
Remote transmission received 10/31/21. Normal device function. Patient is currently in normal rhythm.

## 2021-11-01 NOTE — Telephone Encounter (Signed)
-----   Message from Duke Salvia, MD sent at 09/11/2021 10:15 AM EST ----- Cadence-- would pursue cardioversion-- will get a transmission to confirm still out of rhythm as he has been in and out over the last year E2 and E, can we do transmission  Thanks SK     ----- Message ----- From: Marianne Sofia, PA-C Sent: 09/06/2021  10:33 AM EST To: Duke Salvia, MD, Antonieta Iba, MD  ER follow-up PNA/Flu reports elevated HR. EKG with possible aflutter. Cannot increase BB with borderline BP. Please advise AAA vs cardioversion. Has been on amio in the past, unsure if he tolerated. Reviewed with MD, recommended to ask EP.

## 2021-11-02 ENCOUNTER — Telehealth: Payer: Self-pay | Admitting: Internal Medicine

## 2021-11-02 NOTE — Telephone Encounter (Signed)
Attempted to call the patient/ his wife. No answer- I left a message to please call back.  

## 2021-11-02 NOTE — Telephone Encounter (Signed)
Patient returning call.

## 2021-11-02 NOTE — Telephone Encounter (Signed)
Pt c/o medication issue:  1. Name of Medication: toprol   2. How are you currently taking this medication (dosage and times per day)? Holding since last ov per [facility] 10/03/21  3. Are you having a reaction (difficulty breathing--STAT)? No but Increased fatigue since change increased pulse 90's baseline is usually 70 . Reports bp ok .  4. What is your medication issue? Patient wife concerned about increased fatigue since med change . She also notes he is to hold eliquis for upcoming back procedure 2/14 . She is worried this is going to be harmful for him.   Please call to discuss

## 2021-11-02 NOTE — Telephone Encounter (Signed)
Issue # 1:  1) I spoke with Mrs. Putnum (ok per DPR). She advised that since the patient has been off metoprolol succinate, the patient's fatigue has gotten worse. HR's have been 90's. Today his HR is 103 bpm.  I inquired how his BP readings were doing and per Mrs. Seufert, readings have been "normal," per the patient.  I have asked for a specific BP reading- today he was 125/78.  I have advised Mrs. Dorton that according to the patient's AVS from 10/03/21: 1) Metoprolol succinate 50 mg: - Decrease to 0.5 tablet (25 mg) by mouth TWICE daily x 5 days, then - Decrease to 0.5 tablet (25 mg) by mouth ONCE daily x 5 days, then - Stop   2) In 3 weeks: - if you ARE NO less tired, please resume metoprolol succinate 50 mg twice daily, just call the office so we know you are back on this [phone]   - if you ARE less tired off metoprolol call the office at [phone] and Dr. Graciela Husbands will review to see about further medication changes  I have advised Mrs. Stockinger to have the patient go ahead and resume metoprolol succinate 50 mg- he should take a dose tonight. In the morning, I have advised if he can please check a BP/ HR prior to his morning medications- if HR  60 or above & SBP  > 100, the patient should go ahead and take another metoprolol succinate 50 mg dose.   She is aware I will forward to Dr. Graciela Husbands for ongoing recommendations for his metoprolol succinate.  There is a note in the patient's chart from 11/01/21- [facility]- stating the patient's remote transmission received on 10/31/21 indicated NSR.    Issue # 2:  2) The patient is also scheduled or a back procedure next week and they were concerned about him coming off eliquis. I have advised that the performing physicians office has not reached out to [place] for clearance, so I cannot advise on this at this time. Per Mrs. Sawin, the pain clinic must have reached out to his PCP for clearance as they have been advised his last dose of  eliquis should be on 11/03/21.  I have advised if they have been given these recommendations, they can follow these at the discretion of his PCP.

## 2021-11-03 MED ORDER — METOPROLOL SUCCINATE ER 50 MG PO TB24
50.0000 mg | ORAL_TABLET | Freq: Two times a day (BID) | ORAL | 3 refills | Status: DC
Start: 2021-11-03 — End: 2022-10-29

## 2021-11-03 NOTE — Telephone Encounter (Signed)
I spoke with Dr. Graciela Husbands by phone regarding the patient's symptoms/ heart rates. Per Dr. Graciela Husbands resume metoprolol succinate at 50 mg BID.  I have called and notified the patient's wife of the above recommendations. I have advised her to please continue to have the patient monitor his BP readings as well and let us know if his BP is running low on the metoprolol succinate 50 mg BID dose, or if HR's are not well controlled.  Mrs. Panik voices understanding and is agreeable of the above recommendations.

## 2021-11-03 NOTE — Progress Notes (Signed)
Remote pacemaker transmission.   

## 2021-11-10 ENCOUNTER — Other Ambulatory Visit: Payer: Self-pay | Admitting: Gastroenterology

## 2021-11-10 DIAGNOSIS — R059 Cough, unspecified: Secondary | ICD-10-CM

## 2021-11-10 DIAGNOSIS — R1313 Dysphagia, pharyngeal phase: Secondary | ICD-10-CM

## 2021-11-10 DIAGNOSIS — Z9189 Other specified personal risk factors, not elsewhere classified: Secondary | ICD-10-CM

## 2021-11-17 ENCOUNTER — Other Ambulatory Visit: Payer: Self-pay

## 2021-11-17 ENCOUNTER — Ambulatory Visit
Admission: RE | Admit: 2021-11-17 | Discharge: 2021-11-17 | Disposition: A | Discharge status: Home / Self Care | Payer: Medicare Other | Source: Ambulatory Visit | Attending: Gastroenterology | Admitting: Gastroenterology

## 2021-11-17 DIAGNOSIS — R1313 Dysphagia, pharyngeal phase: Secondary | ICD-10-CM | POA: Insufficient documentation

## 2021-11-17 DIAGNOSIS — R059 Cough, unspecified: Secondary | ICD-10-CM | POA: Diagnosis present

## 2021-11-17 DIAGNOSIS — Z9189 Other specified personal risk factors, not elsewhere classified: Secondary | ICD-10-CM | POA: Diagnosis present

## 2021-11-17 NOTE — Progress Notes (Signed)
Modified Barium Swallow Progress Note  Patient Details  Name: Kyle Morales MRN: [id number] Date of Birth: 1935/11/12  Today's Date: 11/17/2021  Modified Barium Swallow completed.  Full report located under Chart Review in the Imaging Section.  Brief recommendations include the following:  Clinical Impression  During conversation, pt exhibits a wet vocal quality with coughing prior to start of study.  He also appears moderately dysphonic with hoarse vocal quality observed. He also describes difficulty swallowing and globus sensation that has worsened over the last 2-3 months. Pt presents with functional oral and pharyngeal phase abilities when consuming thin liquids, nectar thick liquids, puree, graham cracker in applesauce and whole barium tablet with thin liquids via cup.  However, pt presents with moderate to severe pharyngoesophageal phase dysphagia that results in intermittent silent aspiration of all consistencies. 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 This impedes bolus transit thru the esophagus and results in partial backflow of all boluses into the pharynx. It is then that the bolus fills the pyriform sinuses with intermittent spillage into trachea and is sliently aspirated. Unfortunately diet and liquid modification was not helpful in altering this dysfunction. Extensive education was provided with video playback. Additional information shared on need to increase physical activity to reduce risk of developing pneumonia.   At this time, recommend pt follow up with Dr Norma Fredrickson.    B. Dreama Saa M.S., CCC-SLP, [facility] Speech-Language Pathologist Rehabilitation Services Office [phone]   Dreama Saa 11/17/2021,3:59 PM

## 2021-12-07 DIAGNOSIS — Z9189 Other specified personal risk factors, not elsewhere classified: Secondary | ICD-10-CM | POA: Insufficient documentation

## 2021-12-07 DIAGNOSIS — R499 Unspecified voice and resonance disorder: Secondary | ICD-10-CM | POA: Insufficient documentation

## 2021-12-07 DIAGNOSIS — R933 Abnormal findings on diagnostic imaging of other parts of digestive tract: Secondary | ICD-10-CM | POA: Insufficient documentation

## 2022-01-10 ENCOUNTER — Emergency Department: Payer: Medicare Other

## 2022-01-10 ENCOUNTER — Inpatient Hospital Stay
Admission: EM | Admit: 2022-01-10 | Discharge: 2022-01-13 | DRG: 419 | Disposition: A | Discharge status: Home / Self Care | Payer: Medicare Other | Attending: Internal Medicine | Admitting: Internal Medicine

## 2022-01-10 DIAGNOSIS — Z87440 Personal history of urinary (tract) infections: Secondary | ICD-10-CM | POA: Diagnosis not present

## 2022-01-10 DIAGNOSIS — Z96612 Presence of left artificial shoulder joint: Secondary | ICD-10-CM | POA: Diagnosis present

## 2022-01-10 DIAGNOSIS — I2510 Atherosclerotic heart disease of native coronary artery without angina pectoris: Secondary | ICD-10-CM | POA: Diagnosis present

## 2022-01-10 DIAGNOSIS — K219 Gastro-esophageal reflux disease without esophagitis: Secondary | ICD-10-CM

## 2022-01-10 DIAGNOSIS — E785 Hyperlipidemia, unspecified: Secondary | ICD-10-CM | POA: Diagnosis present

## 2022-01-10 DIAGNOSIS — I493 Ventricular premature depolarization: Secondary | ICD-10-CM | POA: Diagnosis present

## 2022-01-10 DIAGNOSIS — I480 Paroxysmal atrial fibrillation: Secondary | ICD-10-CM | POA: Diagnosis present

## 2022-01-10 DIAGNOSIS — K810 Acute cholecystitis: Secondary | ICD-10-CM | POA: Diagnosis present

## 2022-01-10 DIAGNOSIS — K8000 Calculus of gallbladder with acute cholecystitis without obstruction: Secondary | ICD-10-CM | POA: Diagnosis present

## 2022-01-10 DIAGNOSIS — Z882 Allergy status to sulfonamides status: Secondary | ICD-10-CM | POA: Diagnosis not present

## 2022-01-10 DIAGNOSIS — Z880 Allergy status to penicillin: Secondary | ICD-10-CM

## 2022-01-10 DIAGNOSIS — I4891 Unspecified atrial fibrillation: Secondary | ICD-10-CM | POA: Diagnosis present

## 2022-01-10 DIAGNOSIS — I509 Heart failure, unspecified: Secondary | ICD-10-CM | POA: Diagnosis present

## 2022-01-10 DIAGNOSIS — N400 Enlarged prostate without lower urinary tract symptoms: Secondary | ICD-10-CM | POA: Diagnosis present

## 2022-01-10 DIAGNOSIS — Z7901 Long term (current) use of anticoagulants: Secondary | ICD-10-CM

## 2022-01-10 DIAGNOSIS — Z96611 Presence of right artificial shoulder joint: Secondary | ICD-10-CM | POA: Diagnosis present

## 2022-01-10 DIAGNOSIS — I110 Hypertensive heart disease with heart failure: Secondary | ICD-10-CM | POA: Diagnosis present

## 2022-01-10 DIAGNOSIS — M1990 Unspecified osteoarthritis, unspecified site: Secondary | ICD-10-CM | POA: Diagnosis present

## 2022-01-10 DIAGNOSIS — Z8249 Family history of ischemic heart disease and other diseases of the circulatory system: Secondary | ICD-10-CM | POA: Diagnosis not present

## 2022-01-10 DIAGNOSIS — Z950 Presence of cardiac pacemaker: Secondary | ICD-10-CM | POA: Diagnosis not present

## 2022-01-10 DIAGNOSIS — R0789 Other chest pain: Principal | ICD-10-CM

## 2022-01-10 DIAGNOSIS — J90 Pleural effusion, not elsewhere classified: Secondary | ICD-10-CM

## 2022-01-10 DIAGNOSIS — I10 Essential (primary) hypertension: Secondary | ICD-10-CM | POA: Diagnosis present

## 2022-01-10 DIAGNOSIS — I482 Chronic atrial fibrillation, unspecified: Secondary | ICD-10-CM

## 2022-01-10 DIAGNOSIS — Z79899 Other long term (current) drug therapy: Secondary | ICD-10-CM | POA: Diagnosis not present

## 2022-01-10 DIAGNOSIS — N390 Urinary tract infection, site not specified: Secondary | ICD-10-CM | POA: Diagnosis not present

## 2022-01-10 DIAGNOSIS — E876 Hypokalemia: Secondary | ICD-10-CM | POA: Diagnosis present

## 2022-01-10 DIAGNOSIS — Z9861 Coronary angioplasty status: Secondary | ICD-10-CM

## 2022-01-10 DIAGNOSIS — Z83438 Family history of other disorder of lipoprotein metabolism and other lipidemia: Secondary | ICD-10-CM

## 2022-01-10 DIAGNOSIS — K819 Cholecystitis, unspecified: Secondary | ICD-10-CM

## 2022-01-10 DIAGNOSIS — R1013 Epigastric pain: Secondary | ICD-10-CM | POA: Diagnosis present

## 2022-01-10 LAB — PROTIME-INR
INR: 1.2 (ref 0.8–1.2)
Prothrombin Time: 15.5 seconds — ABNORMAL HIGH (ref 11.4–15.2)

## 2022-01-10 LAB — URINALYSIS, ROUTINE W REFLEX MICROSCOPIC
Bacteria, UA: NONE SEEN
Bilirubin Urine: NEGATIVE
Glucose, UA: NEGATIVE mg/dL
Ketones, ur: 20 mg/dL — AB
Leukocytes,Ua: NEGATIVE
Nitrite: NEGATIVE
Protein, ur: NEGATIVE mg/dL
RBC / HPF: >50 RBC/hpf — ABNORMAL HIGH (ref 0–5)
Specific Gravity, Urine: 1.024 (ref 1.005–1.030)
pH: 5 (ref 5.0–8.0)

## 2022-01-10 LAB — CBC
HCT: 44.2 % (ref 39.0–52.0)
Hemoglobin: 14.6 g/dL (ref 13.0–17.0)
MCH: 32.2 pg (ref 26.0–34.0)
MCHC: 33 g/dL (ref 30.0–36.0)
MCV: 97.6 fL (ref 80.0–100.0)
Platelets: 147 10*3/uL — ABNORMAL LOW (ref 150–400)
RBC: 4.53 MIL/uL (ref 4.22–5.81)
RDW: 12.7 % (ref 11.5–15.5)
WBC: 8.3 10*3/uL (ref 4.0–10.5)
nRBC: 0 % (ref 0.0–0.2)

## 2022-01-10 LAB — TROPONIN I (HIGH SENSITIVITY)
Troponin I (High Sensitivity): 12 ng/L (ref <18)
Troponin I (High Sensitivity): 12 ng/L (ref <18)

## 2022-01-10 LAB — HEPATIC FUNCTION PANEL
ALT: 9 U/L (ref 0–44)
AST: 16 U/L (ref 15–41)
Albumin: 4.2 g/dL (ref 3.5–5.0)
Alkaline Phosphatase: 76 U/L (ref 38–126)
Bilirubin, Direct: 0.3 mg/dL — ABNORMAL HIGH (ref 0.0–0.2)
Indirect Bilirubin: 0.8 mg/dL (ref 0.3–0.9)
Total Bilirubin: 1.1 mg/dL (ref 0.3–1.2)
Total Protein: 7.2 g/dL (ref 6.5–8.1)

## 2022-01-10 LAB — LIPASE, BLOOD: Lipase: 29 U/L (ref 11–51)

## 2022-01-10 LAB — BASIC METABOLIC PANEL
Anion gap: 10 (ref 5–15)
BUN: 15 mg/dL (ref 8–23)
CO2: 28 mmol/L (ref 22–32)
Calcium: 9.2 mg/dL (ref 8.9–10.3)
Chloride: 100 mmol/L (ref 98–111)
Creatinine, Ser: 0.78 mg/dL (ref 0.61–1.24)
GFR, Estimated: >60 mL/min (ref >60)
Glucose, Bld: 119 mg/dL — ABNORMAL HIGH (ref 70–99)
Potassium: 3.2 mmol/L — ABNORMAL LOW (ref 3.5–5.1)
Sodium: 138 mmol/L (ref 135–145)

## 2022-01-10 MED ORDER — LACTATED RINGERS IV BOLUS
1000.0000 mL | Freq: Once | INTRAVENOUS | Status: AC
  Administered 2022-01-10: 1000 mL via INTRAVENOUS

## 2022-01-10 MED ORDER — PIPERACILLIN-TAZOBACTAM 3.375 G IVPB 30 MIN
3.3750 g | Freq: Once | INTRAVENOUS | Status: AC
  Administered 2022-01-10: 3.375 g via INTRAVENOUS
  Filled 2022-01-10: qty 50

## 2022-01-10 MED ORDER — IOHEXOL 300 MG/ML  SOLN
100.0000 mL | Freq: Once | INTRAMUSCULAR | Status: AC | PRN
  Administered 2022-01-10: 100 mL via INTRAVENOUS

## 2022-01-10 MED ORDER — MORPHINE SULFATE (PF) 2 MG/ML IV SOLN
2.0000 mg | Freq: Once | INTRAVENOUS | Status: AC
  Administered 2022-01-10: 2 mg via INTRAVENOUS
  Filled 2022-01-10: qty 1

## 2022-01-10 MED ORDER — SODIUM CHLORIDE 0.9 % IV SOLN: INTRAVENOUS | Status: DC | PRN

## 2022-01-10 MED ORDER — FENTANYL CITRATE PF 50 MCG/ML IJ SOSY
50.0000 ug | PREFILLED_SYRINGE | Freq: Once | INTRAMUSCULAR | Status: AC
  Administered 2022-01-10: 50 ug via INTRAVENOUS
  Filled 2022-01-10: qty 1

## 2022-01-10 NOTE — Progress Notes (Signed)
Noted likelihood of acute calculus cholecystitis in 86 yo male with A-fib on Eliquis.  WBC and LFTs all wnl.  I reviewed CT imaging and feel and u/s would be helpful.  Noted other pulmonary and chest findings as well.  ? ?Appreciate medicine admit for co-morbidities, and will follow up with full consult.  ? ?CLINICAL DATA:  Severe epigastric and lower chest pain, abnormal ?finding on chest x-ray ?  ?EXAM: ?CT CHEST, ABDOMEN, AND PELVIS WITH CONTRAST ?  ?TECHNIQUE: ?Multidetector CT imaging of the chest, abdomen and pelvis was ?performed following the standard protocol during bolus ?administration of intravenous contrast. ?  ?RADIATION DOSE REDUCTION: This exam was performed according to the ?departmental dose-optimization program which includes automated ?exposure control, adjustment of the mA and/or kV according to ?patient size and/or use of iterative reconstruction technique. ?  ?CONTRAST:  OMNIPAQUE IOHEXOL 300 MG/ML  SOLN ?  ?COMPARISON:  01/10/2022, 10/21/2017 ?  ?FINDINGS: ?CT CHEST FINDINGS ?  ?Cardiovascular: Marked left atrial dilatation. No pericardial ?effusion. No thoracic aortic aneurysm or dissection. Moderate ?atherosclerosis of the aorta and coronary vasculature. 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 ?  ?Mediastinum/Nodes: No enlarged mediastinal, hilar, or axillary lymph ?nodes. Thyroid gland, trachea, and esophagus demonstrate no ?significant findings. ?  ?Lungs/Pleura: There is a 2.6 x 2.4 cm area of loculated fluid within ?the right major fissure, corresponding to the chest x-ray finding. ?No acute airspace disease or pneumothorax. Scattered hypoventilatory ?changes at the lung bases. Central airways are patent. ?  ?Musculoskeletal: Displaced posterior right ninth rib fracture ?appears chronic. There are 2 fractures in the posterior right tenth ?rib. One appears chronic, and a second appears subacute with only ?minimal callus formation. Multiple prior healed left rib fractures ?are again noted.  There are no acute bony abnormalities. ?Reconstructed images demonstrate no additional findings. ?  ?CT ABDOMEN PELVIS FINDINGS ?  ?Hepatobiliary: Multiple gallstones. Mild pericholecystic stranding ?adjacent to the gallbladder fundus, with no gallbladder wall ?thickening. Findings are equivocal for acute cholecystitis. ?  ?The liver is grossly unremarkable, with multiple hepatic cysts ?identified. ?  ?Pancreas: Unremarkable. No pancreatic ductal dilatation or ?surrounding inflammatory changes. ?  ?Spleen: Normal in size without focal abnormality. ?  ?Adrenals/Urinary Tract: Multiple bilateral renal cysts. Bilateral ?nonobstructing renal calculi, measuring up to 11 mm on the right and ?less than 2 mm on the left. No obstructive uropathy within either ?kidney. Mild bilateral renal cortical thinning. ?  ?The bladder is moderately distended, with trabeculations compatible ?with chronic bladder outlet obstruction. The adrenals are ?unremarkable. ?  ?Stomach/Bowel: No bowel obstruction or ileus. No bowel wall ?thickening or inflammatory change. Sigmoid diverticulosis without ?diverticulitis. ?  ?Vascular/Lymphatic: Aortic atherosclerosis. No enlarged abdominal or ?pelvic lymph nodes. ?  ?Reproductive: Prostate is enlarged, measuring 4.7 x 6.1 cm. ?  ?Other: No free fluid or free gas. There is a large left inguinal ?hernia containing a portion of the sigmoid colon. No bowel ?obstruction or incarceration. ?  ?Musculoskeletal: Unremarkable right hip arthroplasty. There are no ?acute or destructive bony lesions. Reconstructed images demonstrate ?no additional findings. ?  ?IMPRESSION: ?1. Cholelithiasis, with pericholecystic fat stranding compatible ?with acute cholecystitis. If gallbladder pathology is suspected, ?correlation with ultrasound may be useful. ?2. Subacute posterior right tenth rib fracture, with mild callus ?formation. There also chronic appearing fractures in the right ?posterior ninth and tenth ribs which  may reflect nonunions. No acute ?bony abnormalities. ?3. Large left inguinal hernia containing a portion of the sigmoid ?colon. No incarceration or obstruction. ?4. Nonobstructing bilateral renal  calculi. ?5. Small area of loculated pleural fluid within the right major ?fissure, corresponding to the x-ray finding. ?6. Sigmoid diverticulosis without diverticulitis. ?7. Multiple benign hepatic and renal cysts. No specific imaging ?follow-up is required. ?8. 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 ?9.  Aortic Atherosclerosis (ICD10-I70.0). ?10. Marked left atrial dilatation. ?  ?  ?Electronically Signed ?  By: Sharlet Salina M.D. ?  On: 01/10/2022 22:27 ?

## 2022-01-10 NOTE — ED Notes (Signed)
Pt placed on 2L O2 via [place] due to saturations dropping to 87% ?

## 2022-01-10 NOTE — ED Provider Notes (Signed)
? ?[facility] ?Provider Note ? ? ? Event Date/Time  ? First MD Initiated Contact with Patient 01/10/22 2101   ?  (approximate) ? ? ?History  ? ?Chest Pain ? ? ?HPI ? ?Kyle Morales is a 86 y.o. male who presents to the ED for evaluation of Chest Pain ?  ?I reviewed PCP visit from 3/21.Hx CAD, Afib and CHF. On eliquis. 1/11 barium swallow with silent aspiration.  ? ?Patient presents to the ED from home for evaluation of bilateral upper abdominal/lower chest pain for the past 24 hours or so.  He reports poor appetite, a couple episodes of nonbloody nonbilious emesis.  Reports pain radiating towards his thoracic back.  Denies falls or trauma to his back. ? ?Physical Exam  ? ?Triage Vital Signs: ?ED Triage Vitals  ?Enc Vitals Group  ?   BP 01/10/22 2032 123/80  ?   Pulse Rate 01/10/22 2034 71  ?   Resp 01/10/22 2034 17  ?   Temp 01/10/22 2034 97.7 ?F (36.5 ?C)  ?   Temp Source 01/10/22 2034 Oral  ?   SpO2 01/10/22 2034 94 %  ?   Weight --   ?   Height --   ?   Head Circumference --   ?   Peak Flow --   ?   Pain Score 01/10/22 2034 4  ?   Pain Loc --   ?   Pain Edu? --   ?   Excl. in GC? --   ? ? ?Most recent vital signs: ?Vitals:  ? 01/10/22 2220 01/10/22 2230  ?BP: 129/74 127/82  ?Pulse: 71 72  ?Resp:  16  ?Temp:    ?SpO2: 91% 97%  ? ? ?General: Awake, no distress.  Appears uncomfortable. ?CV:  Good peripheral perfusion.  ?Resp:  Normal effort.  ?Abd:  No distention.  Mild RUQ and epigastric tenderness without peritoneal features or guarding.  Benign lower abdomen. ?MSK:  No deformity noted.  ?Neuro:  No focal deficits appreciated. ?Other:   ? ? ?ED Results / Procedures / Treatments  ? ?Labs ?(all labs ordered are listed, but only abnormal results are displayed) ?Labs Reviewed  ?BASIC METABOLIC PANEL - Abnormal; Notable for the following components:  ?    Result Value  ? Potassium 3.2 (*)   ? Glucose, Bld 119 (*)   ? All other components within normal limits  ?CBC - Abnormal; Notable for the  following components:  ? Platelets 147 (*)   ? All other components within normal limits  ?PROTIME-INR - Abnormal; Notable for the following components:  ? Prothrombin Time 15.5 (*)   ? All other components within normal limits  ?URINALYSIS, ROUTINE W REFLEX MICROSCOPIC - Abnormal; Notable for the following components:  ? Color, Urine YELLOW (*)   ? APPearance HAZY (*)   ? Hgb urine dipstick LARGE (*)   ? Ketones, ur 20 (*)   ? RBC / HPF >50 (*)   ? All other components within normal limits  ?HEPATIC FUNCTION PANEL - Abnormal; Notable for the following components:  ? Bilirubin, Direct 0.3 (*)   ? All other components within normal limits  ?LIPASE, BLOOD  ?TROPONIN I (HIGH SENSITIVITY)  ?TROPONIN I (HIGH SENSITIVITY)  ? ? ?EKG ?Afib, rate of 71bpm. Normal axis. Qtc 587. No STEMI. 1 PVC ? ?RADIOLOGY ?2 view CXR reviewed by me with rounded RLL opacity ?CT chest abdomen pelvis reviewed by me with evidence of cholecystitis. ? ?Official radiology report(s): ?  DG Chest 2 View ? ?Result Date: 01/10/2022 ?CLINICAL DATA:  Rib pain and back pain EXAM: CHEST - 2 VIEW COMPARISON:  09/08/2021 FINDINGS: Frontal and lateral views of the chest demonstrate stable dual lead pacer. Continued ectasia and atherosclerosis of the thoracic aorta. Cardiac silhouette is unremarkable. There is a new rounded density within the right lower lobe measuring up to 3.5 cm on lateral view. CT is recommended for further evaluation. Chronic elevation of the right hemidiaphragm, with platelike atelectasis at the right lung base. No effusion or pneumothorax. Bilateral shoulder arthroplasties. IMPRESSION: 1. New rounded right lower lobe density measuring up to 3.5 cm, concerning for pulmonary nodule. CT chest may be useful for further evaluation. 2. Hypoventilatory changes at the right lung base. Electronically Signed   By: Sharlet Salina M.D.   On: 01/10/2022 21:15  ? ?CT CHEST ABDOMEN PELVIS W CONTRAST ? ?Result Date: 01/10/2022 ?CLINICAL DATA:  Severe  epigastric and lower chest pain, abnormal finding on chest x-ray EXAM: CT CHEST, ABDOMEN, AND PELVIS WITH CONTRAST TECHNIQUE: Multidetector CT imaging of the chest, abdomen and pelvis was performed following the standard protocol during bolus administration of intravenous contrast. RADIATION DOSE REDUCTION: This exam was performed according to the departmental dose-optimization program which includes automated exposure control, adjustment of the mA and/or kV according to patient size and/or use of iterative reconstruction technique. CONTRAST:  OMNIPAQUE IOHEXOL 300 MG/ML  SOLN COMPARISON:  01/10/2022, 10/21/2017 FINDINGS: CT CHEST FINDINGS Cardiovascular: Marked left atrial dilatation. No pericardial effusion. No thoracic aortic aneurysm or dissection. Moderate atherosclerosis of the aorta and coronary vasculature. Dual lead pacemaker is noted. Mediastinum/Nodes: No enlarged mediastinal, hilar, or axillary lymph nodes. Thyroid gland, trachea, and esophagus demonstrate no significant findings. Lungs/Pleura: There is a 2.6 x 2.4 cm area of loculated fluid within the right major fissure, corresponding to the chest x-ray finding. No acute airspace disease or pneumothorax. Scattered hypoventilatory changes at the lung bases. Central airways are patent. Musculoskeletal: Displaced posterior right ninth rib fracture appears chronic. There are 2 fractures in the posterior right tenth rib. One appears chronic, and a second appears subacute with only minimal callus formation. Multiple prior healed left rib fractures are again noted. There are no acute bony abnormalities. Reconstructed images demonstrate no additional findings. CT ABDOMEN PELVIS FINDINGS Hepatobiliary: Multiple gallstones. Mild pericholecystic stranding adjacent to the gallbladder fundus, with no gallbladder wall thickening. Findings are equivocal for acute cholecystitis. The liver is grossly unremarkable, with multiple hepatic cysts identified. Pancreas:  Unremarkable. No pancreatic ductal dilatation or surrounding inflammatory changes. Spleen: Normal in size without focal abnormality. Adrenals/Urinary Tract: Multiple bilateral renal cysts. Bilateral nonobstructing renal calculi, measuring up to 11 mm on the right and less than 2 mm on the left. No obstructive uropathy within either kidney. Mild bilateral renal cortical thinning. The bladder is moderately distended, with trabeculations compatible with chronic bladder outlet obstruction. The adrenals are unremarkable. Stomach/Bowel: No bowel obstruction or ileus. No bowel wall thickening or inflammatory change. Sigmoid diverticulosis without diverticulitis. Vascular/Lymphatic: Aortic atherosclerosis. No enlarged abdominal or pelvic lymph nodes. Reproductive: Prostate is enlarged, measuring 4.7 x 6.1 cm. Other: No free fluid or free gas. There is a large left inguinal hernia containing a portion of the sigmoid colon. No bowel obstruction or incarceration. Musculoskeletal: Unremarkable right hip arthroplasty. There are no acute or destructive bony lesions. Reconstructed images demonstrate no additional findings. IMPRESSION: 1. Cholelithiasis, with pericholecystic fat stranding compatible with acute cholecystitis. If gallbladder pathology is suspected, correlation with ultrasound may be useful. 2.  Subacute posterior right tenth rib fracture, with mild callus formation. There also chronic appearing fractures in the right posterior ninth and tenth ribs which may reflect nonunions. No acute bony abnormalities. 3. Large left inguinal hernia containing a portion of the sigmoid colon. No incarceration or obstruction. 4. Nonobstructing bilateral renal calculi. 5. Small area of loculated pleural fluid within the right major fissure, corresponding to the x-ray finding. 6. Sigmoid diverticulosis without diverticulitis. 7. Multiple benign hepatic and renal cysts. No specific imaging follow-up is required. 8. Enlarged prostate, with  evidence of chronic bladder outlet obstruction. 9.  Aortic Atherosclerosis (ICD10-I70.0). 10. Marked left atrial dilatation. Electronically Signed   By: Sharlet Salina M.D.   On: 01/10/2022 22:27   ? ?PROC

## 2022-01-10 NOTE — ED Triage Notes (Signed)
Pt arrived via EMS from home. Pt sts that he is having pain under his ribs and in his back. Pt had a barium swallow study and since than he has been having this pain. ?

## 2022-01-11 ENCOUNTER — Inpatient Hospital Stay: Payer: Medicare Other

## 2022-01-11 ENCOUNTER — Other Ambulatory Visit: Payer: Self-pay

## 2022-01-11 ENCOUNTER — Encounter: Payer: Self-pay | Admitting: Family Medicine

## 2022-01-11 DIAGNOSIS — K8000 Calculus of gallbladder with acute cholecystitis without obstruction: Secondary | ICD-10-CM | POA: Diagnosis not present

## 2022-01-11 DIAGNOSIS — E876 Hypokalemia: Secondary | ICD-10-CM

## 2022-01-11 DIAGNOSIS — K219 Gastro-esophageal reflux disease without esophagitis: Secondary | ICD-10-CM

## 2022-01-11 DIAGNOSIS — E785 Hyperlipidemia, unspecified: Secondary | ICD-10-CM

## 2022-01-11 DIAGNOSIS — J90 Pleural effusion, not elsewhere classified: Secondary | ICD-10-CM

## 2022-01-11 DIAGNOSIS — N390 Urinary tract infection, site not specified: Secondary | ICD-10-CM

## 2022-01-11 DIAGNOSIS — R0789 Other chest pain: Secondary | ICD-10-CM

## 2022-01-11 DIAGNOSIS — N400 Enlarged prostate without lower urinary tract symptoms: Secondary | ICD-10-CM

## 2022-01-11 LAB — COMPREHENSIVE METABOLIC PANEL
ALT: 10 U/L (ref 0–44)
AST: 17 U/L (ref 15–41)
Albumin: 3.6 g/dL (ref 3.5–5.0)
Alkaline Phosphatase: 64 U/L (ref 38–126)
Anion gap: 6 (ref 5–15)
BUN: 13 mg/dL (ref 8–23)
CO2: 27 mmol/L (ref 22–32)
Calcium: 8.7 mg/dL — ABNORMAL LOW (ref 8.9–10.3)
Chloride: 107 mmol/L (ref 98–111)
Creatinine, Ser: 0.79 mg/dL (ref 0.61–1.24)
GFR, Estimated: >60 mL/min (ref >60)
Glucose, Bld: 134 mg/dL — ABNORMAL HIGH (ref 70–99)
Potassium: 3.5 mmol/L (ref 3.5–5.1)
Sodium: 140 mmol/L (ref 135–145)
Total Bilirubin: 1.1 mg/dL (ref 0.3–1.2)
Total Protein: 6.2 g/dL — ABNORMAL LOW (ref 6.5–8.1)

## 2022-01-11 LAB — CBC
HCT: 39.3 % (ref 39.0–52.0)
Hemoglobin: 13.1 g/dL (ref 13.0–17.0)
MCH: 32.2 pg (ref 26.0–34.0)
MCHC: 33.3 g/dL (ref 30.0–36.0)
MCV: 96.6 fL (ref 80.0–100.0)
Platelets: 140 10*3/uL — ABNORMAL LOW (ref 150–400)
RBC: 4.07 MIL/uL — ABNORMAL LOW (ref 4.22–5.81)
RDW: 12.7 % (ref 11.5–15.5)
WBC: 8 10*3/uL (ref 4.0–10.5)
nRBC: 0 % (ref 0.0–0.2)

## 2022-01-11 LAB — PROTIME-INR
INR: 1.3 — ABNORMAL HIGH (ref 0.8–1.2)
Prothrombin Time: 15.6 seconds — ABNORMAL HIGH (ref 11.4–15.2)

## 2022-01-11 LAB — APTT: aPTT: 42 seconds — ABNORMAL HIGH (ref 24–36)

## 2022-01-11 MED ORDER — FLUTICASONE PROPIONATE 50 MCG/ACT NA SUSP: 2.0000 | Freq: Two times a day (BID) | NASAL | Status: DC | PRN

## 2022-01-11 MED ORDER — FUROSEMIDE 40 MG PO TABS
60.0000 mg | ORAL_TABLET | Freq: Every day | ORAL | Status: DC
  Administered 2022-01-11: 60 mg via ORAL
  Filled 2022-01-11: qty 1

## 2022-01-11 MED ORDER — CEFTRIAXONE SODIUM 2 G IJ SOLR
2.0000 g | INTRAMUSCULAR | Status: DC
Start: 2022-01-11 — End: 2022-01-13
  Administered 2022-01-11 – 2022-01-12 (×2): 2 g via INTRAVENOUS
  Filled 2022-01-11: qty 2
  Filled 2022-01-11: qty 20
  Filled 2022-01-11: qty 2

## 2022-01-11 MED ORDER — IPRATROPIUM BROMIDE HFA 17 MCG/ACT IN AERS: 2.0000 | INHALATION_SPRAY | Freq: Four times a day (QID) | RESPIRATORY_TRACT | Status: DC

## 2022-01-11 MED ORDER — ACETAMINOPHEN 650 MG RE SUPP: 650.0000 mg | Freq: Four times a day (QID) | RECTAL | Status: DC | PRN

## 2022-01-11 MED ORDER — METRONIDAZOLE 500 MG/100ML IV SOLN
500.0000 mg | Freq: Three times a day (TID) | INTRAVENOUS | Status: DC
  Administered 2022-01-11 – 2022-01-13 (×7): 500 mg via INTRAVENOUS
  Filled 2022-01-11 (×9): qty 100

## 2022-01-11 MED ORDER — TAMSULOSIN HCL 0.4 MG PO CAPS
0.8000 mg | ORAL_CAPSULE | Freq: Every day | ORAL | Status: DC
  Administered 2022-01-11 – 2022-01-13 (×3): 0.8 mg via ORAL
  Filled 2022-01-11 (×3): qty 2

## 2022-01-11 MED ORDER — MORPHINE SULFATE (PF) 2 MG/ML IV SOLN
2.0000 mg | Freq: Once | INTRAVENOUS | Status: AC
  Administered 2022-01-11: 2 mg via INTRAVENOUS
  Filled 2022-01-11: qty 1

## 2022-01-11 MED ORDER — ZINC SULFATE 220 (50 ZN) MG PO CAPS
220.0000 mg | ORAL_CAPSULE | Freq: Every day | ORAL | Status: DC
  Administered 2022-01-11 – 2022-01-13 (×2): 220 mg via ORAL
  Filled 2022-01-11 (×2): qty 1

## 2022-01-11 MED ORDER — PRAVASTATIN SODIUM 20 MG PO TABS
20.0000 mg | ORAL_TABLET | Freq: Every day | ORAL | Status: DC
Start: 2022-01-11 — End: 2022-01-13
  Administered 2022-01-11 – 2022-01-12 (×2): 20 mg via ORAL
  Filled 2022-01-11 (×2): qty 1

## 2022-01-11 MED ORDER — POTASSIUM CHLORIDE IN NACL 20-0.9 MEQ/L-% IV SOLN
INTRAVENOUS | Status: DC
  Filled 2022-01-11 (×7): qty 1000

## 2022-01-11 MED ORDER — MORPHINE SULFATE (PF) 2 MG/ML IV SOLN
2.0000 mg | INTRAVENOUS | Status: DC | PRN
Start: 2022-01-11 — End: 2022-01-11

## 2022-01-11 MED ORDER — POTASSIUM CHLORIDE CRYS ER 20 MEQ PO TBCR
20.0000 meq | EXTENDED_RELEASE_TABLET | Freq: Every day | ORAL | Status: DC
  Administered 2022-01-11 – 2022-01-13 (×2): 20 meq via ORAL
  Filled 2022-01-11 (×2): qty 1

## 2022-01-11 MED ORDER — INDOCYANINE GREEN 25 MG IV SOLR
2.5000 mg | INTRAVENOUS | Status: AC
  Administered 2022-01-12: 2.5 mg via INTRAVENOUS
  Filled 2022-01-11: qty 1

## 2022-01-11 MED ORDER — ONDANSETRON HCL 4 MG/2ML IJ SOLN: 4.0000 mg | Freq: Four times a day (QID) | INTRAMUSCULAR | Status: DC | PRN

## 2022-01-11 MED ORDER — ACETAMINOPHEN 325 MG PO TABS
650.0000 mg | ORAL_TABLET | Freq: Four times a day (QID) | ORAL | Status: DC | PRN
Start: 2022-01-11 — End: 2022-01-13
  Administered 2022-01-11 – 2022-01-13 (×2): 650 mg via ORAL
  Filled 2022-01-11 (×2): qty 2

## 2022-01-11 MED ORDER — ONDANSETRON HCL 4 MG PO TABS: 4.0000 mg | ORAL_TABLET | Freq: Four times a day (QID) | ORAL | Status: DC | PRN

## 2022-01-11 MED ORDER — METOPROLOL SUCCINATE ER 50 MG PO TB24
50.0000 mg | ORAL_TABLET | Freq: Every day | ORAL | Status: DC
  Administered 2022-01-11 – 2022-01-13 (×3): 50 mg via ORAL
  Filled 2022-01-11 (×3): qty 1

## 2022-01-11 MED ORDER — PANTOPRAZOLE SODIUM 40 MG PO TBEC
40.0000 mg | DELAYED_RELEASE_TABLET | Freq: Every day | ORAL | Status: DC
  Administered 2022-01-11 – 2022-01-13 (×3): 40 mg via ORAL
  Filled 2022-01-11 (×3): qty 1

## 2022-01-11 MED ORDER — ALBUTEROL SULFATE HFA 108 (90 BASE) MCG/ACT IN AERS: 2.0000 | INHALATION_SPRAY | RESPIRATORY_TRACT | Status: DC | PRN

## 2022-01-11 MED ORDER — ALBUTEROL SULFATE (2.5 MG/3ML) 0.083% IN NEBU
2.5000 mg | INHALATION_SOLUTION | RESPIRATORY_TRACT | Status: DC | PRN
Start: 2022-01-11 — End: 2022-01-13

## 2022-01-11 MED ORDER — TRAZODONE HCL 50 MG PO TABS
25.0000 mg | ORAL_TABLET | Freq: Every evening | ORAL | Status: DC | PRN
  Administered 2022-01-11: 25 mg via ORAL
  Filled 2022-01-11: qty 1

## 2022-01-11 MED ORDER — MAGNESIUM HYDROXIDE 400 MG/5ML PO SUSP: 30.0000 mL | Freq: Every day | ORAL | Status: DC | PRN

## 2022-01-11 MED ORDER — NITROGLYCERIN 0.4 MG SL SUBL: 0.4000 mg | SUBLINGUAL_TABLET | SUBLINGUAL | Status: DC | PRN

## 2022-01-11 MED ORDER — ENOXAPARIN SODIUM 40 MG/0.4ML IJ SOSY
40.0000 mg | PREFILLED_SYRINGE | INTRAMUSCULAR | Status: DC
  Administered 2022-01-11: 40 mg via SUBCUTANEOUS
  Filled 2022-01-11: qty 0.4

## 2022-01-11 MED ORDER — FINASTERIDE 5 MG PO TABS
5.0000 mg | ORAL_TABLET | Freq: Every day | ORAL | Status: DC
  Administered 2022-01-11 – 2022-01-13 (×3): 5 mg via ORAL
  Filled 2022-01-11 (×3): qty 1

## 2022-01-11 NOTE — Assessment & Plan Note (Signed)
-   We will continue statin therapy. 

## 2022-01-11 NOTE — Assessment & Plan Note (Signed)
-   He has controlled rate. ?- We will continue his Toprol-XL. ?- We will hold off his Eliquis. ?

## 2022-01-11 NOTE — Assessment & Plan Note (Signed)
repleted   Serial BMP 

## 2022-01-11 NOTE — Assessment & Plan Note (Signed)
-   We will follow troponins. ?- It is likely related to his acute cholecystitis ?

## 2022-01-11 NOTE — Assessment & Plan Note (Signed)
-   We will continue his PPI therapy. 

## 2022-01-11 NOTE — Assessment & Plan Note (Addendum)
-   This is associated with cholelithiasis without obstructive jaundice. ?- The patient will be admitted to a medical monitored bed. ?- We will continue IV Zosyn. ?- Pain management will be provided. ?- General surgery consultation will be obtained. ?- Dr. Claudine Mouton is aware about the patient. ?

## 2022-01-11 NOTE — Assessment & Plan Note (Signed)
-   This is on the right.  Will  need to be followed. ?

## 2022-01-11 NOTE — H&P (Addendum)
?  ?  ?[place] ? ? ?PATIENT NAME: Kyle Morales   ? ?MR#:  [id number] ? ?DATE OF BIRTH:  May 27, 1936 ? ?DATE OF ADMISSION:  01/10/2022 ? ?PRIMARY CARE PHYSICIAN: Katrinka Blazing, MD  ? ?Patient is coming from: Home ? ?REQUESTING/REFERRING PHYSICIAN: Ronnette Juniper, MD ? ?CHIEF COMPLAINT:  ? ?Chief Complaint  ?Patient presents with  ? Chest Pain  ? ? ?HISTORY OF PRESENT ILLNESS:  ?Kyle Morales is a 86 y.o. male with medical history significant for multiple medical problems that are mentioned below, who presented to the ER with acute onset of lower bilateral chest pain as well as epigastric abdominal pain with associated mild nausea and vomiting since Tuesday.  He admitted to mild cough without dyspnea or wheezing.  No fever or chills.  He has been recently treated for UTI with Macrodantin since Friday. ?ED Course: When he came to the ER vital signs were within normal.  Labs revealed hypokalemia with potassium at 3.2 and total protein of 6.2.  CBC showed platelets of 140.  UA was positive for UTI. ?EKG as reviewed by me : EKG showed atrial fibrillation with control ventricular sponsor of 71 with low voltage QRS and abnormal R wave progression with PVCs. ?Imaging: Two-view chest x-ray showed new pulmonary nodule in the right lower lobe measuring 3.6 cm with recommendation for chest CT and hypoventilatory changes in the right lung base. ?Abdominal pelvic CT scan revealed the following: ?1. Cholelithiasis, with pericholecystic fat stranding compatible ?with acute cholecystitis. If gallbladder pathology is suspected, ?correlation with ultrasound may be useful. ?2. Subacute posterior right tenth rib fracture, with mild callus ?formation. There also chronic appearing fractures in the right ?posterior ninth and tenth ribs which may reflect nonunions. No acute ?bony abnormalities. ?3. Large left inguinal hernia containing a portion of the sigmoid ?colon. No incarceration or obstruction. ?4. Nonobstructing bilateral renal  calculi. ?5. Small area of loculated pleural fluid within the right major ?fissure, corresponding to the x-ray finding. ?6. Sigmoid diverticulosis without diverticulitis. ?7. Multiple benign hepatic and renal cysts. No specific imaging ?follow-up is required. ?8. 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 ?9.  Aortic Atherosclerosis (ICD10-I70.0). ?10. Marked left atrial dilatation. ?Right upper quadrant ultrasound revealed the following: ?1. Cholelithiasis, with additional sonographic findings worrisome ?for acute cholecystitis and with 1.3 cm stone impacted in the ?gallbladder neck. ?2. Cirrhotic liver configuration with stable cysts. No suspicious ?liver abnormality. No portal vein dilatation or flow reversal. ?3. Upper-normal common bile duct caliber, but similar to prior CTs. ?Laboratory and clinical correlation suggested. ? ?The patient was given IV Zosyn, 2 mg IV morphine sulfate, 1 L bolus of IV lactated Ringer and 50 mcg of IV fentanyl.  Dr. Claudine Mouton was notified about the patient and is aware.  He will be admitted to a medical monitored bed for further evaluation and management. ?PAST MEDICAL HISTORY:  ? ?Past Medical History:  ?Diagnosis Date  ? CHF (congestive heart failure) (HCC)   ? Coronary artery disease   ? Eczema   ? Hyperlipidemia   ? Hypertension   ? Osteoarthrosis   ? Paroxysmal A-fib (HCC)   ? Presence of permanent cardiac pacemaker   ? S/P cardiac pacemaker procedure   ? ? ?PAST SURGICAL HISTORY:  ? ?Past Surgical History:  ?Procedure Laterality Date  ? CARDIAC CATHETERIZATION  1997  ? CARDIAC CATHETERIZATION  2000  ? COLONOSCOPY  2012  ? CORONARY ANGIOPLASTY  1997  ? ramus  ? ESOPHAGOGASTRODUODENOSCOPY N/A 02/11/2017  ?  Procedure: ESOPHAGOGASTRODUODENOSCOPY (EGD);  Surgeon: Christena DeemSkulskie, Martin U, MD;  Location: [facility] ENDOSCOPY;  Service: Endoscopy;  Laterality: N/A;  ? HERNIA REPAIR    ? INSERT / REPLACE / REMOVE PACEMAKER  12/2005  ? Medtronic [id number] H - placed in  [place]  ? MANDIBLE SURGERY    ? TONSILLECTOMY    ? TOTAL SHOULDER REPLACEMENT    ? bilateral   ? ? ?SOCIAL HISTORY:  ? ?Social History  ? ?Tobacco Use  ? Smoking status: Never  ? Smokeless tobacco: Never  ?Substance Use Topics  ? Alcohol use: No  ?  Comment: social drinker; 1 bottle of beer every 2 weeks.  ? ? ?FAMILY HISTORY:  ? ?Family History  ?Problem Relation Age of Onset  ? Heart disease Mother   ? Hypertension Mother   ? Hyperlipidemia Mother   ? Heart attack Father   ? Heart disease Brother   ? ? ?DRUG ALLERGIES:  ? ?Allergies  ?Allergen Reactions  ? Amoxicillin Rash and Itching  ? Bactrim [Sulfamethoxazole-Trimethoprim] Rash  ? ? ?REVIEW OF SYSTEMS:  ? ?ROS ?As per history of present illness. All pertinent systems were reviewed above. Constitutional, HEENT, cardiovascular, respiratory, GI, GU, musculoskeletal, neuro, psychiatric, endocrine, integumentary and hematologic systems were reviewed and are otherwise negative/unremarkable except for positive findings mentioned above in the HPI. ? ? ?MEDICATIONS AT HOME:  ? ?Prior to Admission medications   ?Medication Sig Start Date End Date Taking? Authorizing Provider  ?acetaminophen (TYLENOL) 500 MG tablet Take 500 mg by mouth every 8 (eight) hours as needed.    [provider]  ?albuterol (VENTOLIN HFA) 108 (90 Base) MCG/ACT inhaler Inhale into the lungs. 08/14/21 08/14/22  [provider]  ?apixaban (ELIQUIS) 5 MG TABS tablet Take 1 tablet (5 mg total) by mouth 2 (two) times daily. 07/18/21   Duke SalviaKlein, Steven C, MD  ?[facility] ZINC 50 MG TABS Take 50 mg by mouth daily.    [provider]  ?finasteride (PROSCAR) 5 MG tablet Take 5 mg by mouth daily. 04/03/19   [provider]  ?fluticasone (FLONASE) 50 MCG/ACT nasal spray Place 2 sprays into both nostrils 2 (two) times daily. 04/20/16 11/28/21  [provider]  ?furosemide (LASIX) 40 MG tablet Take by mouth. 3 tablets daily and may take 4 tablets if needed    [provider]  ?ipratropium (ATROVENT HFA) 17 MCG/ACT inhaler Inhale 2 puffs into the lungs 4 (four) times daily. 03/18/18 01/04/22  Duke SalviaKlein, Steven C, MD  ?lovastatin (MEVACOR) 20 MG tablet Take 1 tablet (20 mg total) by mouth at bedtime. 08/07/21   Antonieta IbaGollan, Timothy J, MD  ?metoprolol succinate (TOPROL-XL) 50 MG 24 hr tablet Take 1 tablet (50 mg total) by mouth in the morning and at bedtime. Take with or immediately following a meal. 11/03/21 02/01/22  Duke SalviaKlein, Steven C, MD  ?metoprolol tartrate (LOPRESSOR) 25 MG tablet Take 1 tablet (25 mg total) by mouth 2 (two) times daily as needed. Take as needed for palpitations, HR > 100 08/07/21 11/05/21  Antonieta IbaGollan, Timothy J, MD  ?nitroGLYCERIN (NITROSTAT) 0.4 MG SL tablet Place 1 tablet (0.4 mg total) under the tongue every 5 (five) minutes as needed for chest pain. 07/29/15   Antonieta IbaGollan, Timothy J, MD  ?omeprazole (PRILOSEC) 20 MG capsule Take 20 mg by mouth as needed.    [provider]  ?potassium chloride SA (KLOR-CON) 20 MEQ tablet Take 1 tablet (20 mEq total) by mouth daily. Take with lasix 08/07/21   Antonieta IbaGollan, Timothy J, MD  ?  predniSONE (DELTASONE) 50 MG tablet Take 1 tablet (50 mg total) by mouth daily with breakfast. ?Patient not taking: Reported on 10/03/2021 08/21/21   Jene Every, MD  ?tamsulosin (FLOMAX) 0.4 MG CAPS capsule Take 0.8 mg by mouth daily after breakfast.     [provider]  ? ?  ? ?VITAL SIGNS:  ?Blood pressure (!) 105/57, pulse 71, temperature 97.6 ?F (36.4 ?C), temperature source Oral, resp. rate 20, height 5\' 11"  (1.803 m), weight 71.7 kg, SpO2 96 %. ? ?PHYSICAL EXAMINATION:  ?Physical Exam ? ?GENERAL:  86 y.o.-year-old Caucasian male patient lying in the bed with no acute distress.  ?EYES: Pupils equal, round, reactive to light and accommodation. No scleral icterus. Extraocular muscles intact.  ?HEENT: Head atraumatic, normocephalic. Oropharynx and nasopharynx clear.  ?NECK:  Supple, no jugular venous distention. No thyroid enlargement, no  tenderness.  ?LUNGS: Normal breath sounds bilaterally, no wheezing, rales,rhonchi or crepitation. No use of accessory muscles of respiration.  ?CARDIOVASCULAR: Regular rate and rhythm, S1, S2 normal. No murmurs

## 2022-01-11 NOTE — Progress Notes (Signed)
Brief hospitalist update note.  This is a nonbillable note.  Please see same-day H&P from Dr. Arville Care for full billable details. ? ?Briefly, this is an 86 year old male with history significant for chronic congestive heart failure, coronary artery disease, hyperlipidemia, hypertension, paroxysmal atrial fibrillation on chronic anticoagulation who presents with acute onset bilateral lower chest pain and epigastric abdominal pain associated with mild nausea and vomiting x2 days.  Vital signs stable.  Abdominal imaging reveals cholelithiasis concerning for cholecystitis.  Right upper quadrant ultrasound ordered and follow-up per surgery recommendations.  This reveals evidence concerning for acute cholecystitis with likely stone at the gallbladder neck. ? ?General surgery consulted.  Plan for robotic assisted laparoscopic cholecystectomy 4/21 ? ?Plan: ?Antibiotics ?IV fluids ?Pain control ?Okay for diet today ?N.p.o. after midnight ?OR with general surgery tomorrow ? ?Remainder of care per HPI ? ?Lolita Patella MD ? ?No charge ? ?

## 2022-01-11 NOTE — Assessment & Plan Note (Signed)
-   We will continue his antihypertensives. 

## 2022-01-11 NOTE — Assessment & Plan Note (Signed)
-   This should be covered with IV Zosyn and urine culture will be followed. ?

## 2022-01-11 NOTE — Consult Note (Signed)
[facility] ?SURGICAL CONSULTATION NOTE (initial) - cpt: [id number] ? ?HISTORY OF PRESENT ILLNESS (HPI):  ?86 y.o. male presented to [facility] ED overnight for evaluation of chest pain. Patient reports this was more likely abdominal pain. He notes that last night he got up to pee and had a sudden increase in bilateral lower abdominal pain which radiated upwards to his RUQ and under his ribs. This ultimately radiated to his back and right shoulder. He endorsed associated nausea and emesis. No fever, chills, CP, SOB, urinary changes, bowel changes, or juandice. No history of similar pain or post-prandial pain in the past. Only previous intra-abdominal surgery is previous hernia repair. Of note, patient does have a history of PAF on Eliquis. Last dose was yesterday (04/19) morning. Difficult to ascertain how well he tolerates ADLs from functional status. Work up in the ED revealed a normal WBC to 8.3K, Hgb normal at 14.6, renal function normal with sCr - 0.78, mild hypokalemia to 3.2, LFTs and Bilirubin levels were normal. He did undergo CT Chest/Abdomen/Pelvis which was concerning for acute cholecystitis, also showed large left inguinal hernia containing bowel. RUQ [place] followed which was concerning for the same. Ultimately, was admitted to the medicine service given comorbid disease.  ? ?Surgery is consulted by emergency medicine physician Dr. Vladimir Crofts, MD in this context for evaluation and management of acute cholecystitis. ? ? ?PAST MEDICAL HISTORY (PMH):  ?Past Medical History:  ?Diagnosis Date  ? CHF (congestive heart failure) ([place])   ? Coronary artery disease   ? Eczema   ? Hyperlipidemia   ? Hypertension   ? Osteoarthrosis   ? Paroxysmal A-fib ([place])   ? Presence of permanent cardiac pacemaker   ? S/P cardiac pacemaker procedure   ?  ? ?PAST SURGICAL HISTORY ([place]):  ?Past Surgical History:  ?Procedure Laterality Date  ? CARDIAC CATHETERIZATION  1997  ? CARDIAC CATHETERIZATION  2000  ? COLONOSCOPY  2012   ? CORONARY ANGIOPLASTY  1997  ? ramus  ? ESOPHAGOGASTRODUODENOSCOPY N/A 02/11/2017  ? Procedure: ESOPHAGOGASTRODUODENOSCOPY (EGD);  Surgeon: Lollie Sails, MD;  Location: [facility] ENDOSCOPY;  Service: Endoscopy;  Laterality: N/A;  ? HERNIA REPAIR    ? INSERT / REPLACE / REMOVE PACEMAKER  12/2005  ? Medtronic [id number] H - placed in [place]  ? MANDIBLE SURGERY    ? TONSILLECTOMY    ? TOTAL SHOULDER REPLACEMENT    ? bilateral   ?  ? ?MEDICATIONS:  ?Prior to Admission medications   ?Medication Sig Start Date End Date Taking? Authorizing Provider  ?acetaminophen (TYLENOL) 500 MG tablet Take 500 mg by mouth every 8 (eight) hours as needed.    [provider]  ?albuterol (VENTOLIN HFA) 108 (90 Base) MCG/ACT inhaler Inhale into the lungs. 08/14/21 08/14/22  [provider]  ?apixaban (ELIQUIS) 5 MG TABS tablet Take 1 tablet (5 mg total) by mouth 2 (two) times daily. 07/18/21   Deboraha Sprang, MD  ?[facility]S ZINC 50 MG TABS Take 50 mg by mouth daily.    [provider]  ?finasteride (PROSCAR) 5 MG tablet Take 5 mg by mouth daily. 04/03/19   [provider]  ?fluticasone (FLONASE) 50 MCG/ACT nasal spray Place 2 sprays into both nostrils 2 (two) times daily. 04/20/16 11/28/21  [provider]  ?furosemide (LASIX) 40 MG tablet Take by mouth. 3 tablets daily and may take 4 tablets if needed    [provider]  ?ipratropium (ATROVENT HFA) 17 MCG/ACT inhaler Inhale 2 puffs into the lungs 4 (  four) times daily. 03/18/18 01/04/22  Deboraha Sprang, MD  ?lovastatin (MEVACOR) 20 MG tablet Take 1 tablet (20 mg total) by mouth at bedtime. 08/07/21   Minna Merritts, MD  ?metoprolol succinate (TOPROL-XL) 50 MG 24 hr tablet Take 1 tablet (50 mg total) by mouth in the morning and at bedtime. Take with or immediately following a meal. 11/03/21 02/01/22  Deboraha Sprang, MD  ?metoprolol tartrate (LOPRESSOR) 25 MG tablet Take 1 tablet (25 mg total) by mouth 2 (two) times daily as needed. Take as  needed for palpitations, HR > 100 08/07/21 11/05/21  Minna Merritts, MD  ?nitroGLYCERIN (NITROSTAT) 0.4 MG SL tablet Place 1 tablet (0.4 mg total) under the tongue every 5 (five) minutes as needed for chest pain. 07/29/15   Minna Merritts, MD  ?omeprazole (PRILOSEC) 20 MG capsule Take 20 mg by mouth as needed.    [provider]  ?potassium chloride SA (KLOR-CON) 20 MEQ tablet Take 1 tablet (20 mEq total) by mouth daily. Take with lasix 08/07/21   Minna Merritts, MD  ?predniSONE (DELTASONE) 50 MG tablet Take 1 tablet (50 mg total) by mouth daily with breakfast. ?Patient not taking: Reported on 10/03/2021 08/21/21   Lavonia Drafts, MD  ?tamsulosin (FLOMAX) 0.4 MG CAPS capsule Take 0.8 mg by mouth daily after breakfast.     [provider]  ?  ? ?ALLERGIES:  ?Allergies  ?Allergen Reactions  ? Amoxicillin Rash and Itching  ? Bactrim [Sulfamethoxazole-Trimethoprim] Rash  ?  ? ?SOCIAL HISTORY:  ?Social History  ? ?Socioeconomic History  ? Marital status: Married  ?  Spouse name: Not on file  ? Number of children: Not on file  ? Years of education: Not on file  ? Highest education level: Not on file  ?Occupational History  ? Not on file  ?Tobacco Use  ? Smoking status: Never  ? Smokeless tobacco: Never  ?Vaping Use  ? Vaping Use: Never used  ?Substance and Sexual Activity  ? Alcohol use: No  ?  Comment: social drinker; 1 bottle of beer every 2 weeks.  ? Drug use: No  ? Sexual activity: Not on file  ?Other Topics Concern  ? Not on file  ?Social History Narrative  ? Not on file  ? ?Social Determinants of Health  ? ?Financial Resource Strain: Not on file  ?Food Insecurity: Not on file  ?Transportation Needs: Not on file  ?Physical Activity: Not on file  ?Stress: Not on file  ?Social Connections: Not on file  ?Intimate Partner Violence: Not on file  ?  ? ?FAMILY HISTORY:  ?Family History  ?Problem Relation Age of Onset  ? Heart disease Mother   ? Hypertension Mother   ? Hyperlipidemia Mother   ? Heart  attack Father   ? Heart disease Brother   ?  ? ? ?REVIEW OF SYSTEMS:  ?Review of Systems  ?Constitutional:  Negative for chills and fever.  ?HENT:  Negative for congestion and sore throat.   ?Respiratory:  Negative for cough and shortness of breath.   ?Cardiovascular:  Negative for chest pain and palpitations.  ?Gastrointestinal:  Positive for abdominal pain, nausea and vomiting. Negative for constipation and diarrhea.  ?Genitourinary:  Negative for dysuria and urgency.  ?All other systems reviewed and are negative. ? ?VITAL SIGNS:  ?Temp:  [97.6 ?F (36.4 ?C)-98.7 ?F (37.1 ?C)] 97.6 ?F (36.4 ?C) (04/20 0456) ?Pulse Rate:  [70-79] 71 (04/20 0456) ?Resp:  [16-23] 20 (04/20 0456) ?BP: (105-132)/(57-82) 105/57 (  04/20 0456) ?SpO2:  [91 %-97 %] 96 % (04/20 0456) ?Weight:  [71.7 kg] 71.7 kg (04/20 0051)     Height: 5\' 11"  (180.3 cm) Weight: 71.7 kg BMI (Calculated): 22.05  ? ?INTAKE/OUTPUT:  ?04/19 0701 - 04/20 0700 ?In: 1445.9 [I.V.:295.9; IV Piggyback:1150] ?Out: -  ? ?PHYSICAL EXAM:  ?Physical Exam ?Vitals and nursing note reviewed. Exam conducted with a chaperone present.  ?Constitutional:   ?   General: He is not in acute distress. ?   Appearance: Normal appearance. He is normal weight. He is not ill-appearing.  ?HENT:  ?   Head: Normocephalic and atraumatic.  ?Eyes:  ?   General: No scleral icterus. ?   Conjunctiva/sclera: Conjunctivae normal.  ?Cardiovascular:  ?   Rate and Rhythm: Normal rate. Rhythm irregular.  ?   Pulses: Normal pulses.  ?Pulmonary:  ?   Effort: Pulmonary effort is normal. No respiratory distress.  ?Abdominal:  ?   General: Abdomen is flat. There is no distension.  ?   Palpations: Abdomen is soft.  ?   Tenderness: There is no abdominal tenderness. There is no guarding or rebound.  ?   Hernia: A hernia is present. Hernia is present in the left inguinal area.  ?   Comments: Abdomen is soft, he is not tender, non-distended, no rebound/guarding. Negative Murphy's sign. Left inguinal hernia, soft   ?Genitourinary: ?   Comments: Deferred  ?Musculoskeletal:  ?   Right lower leg: No edema.  ?   Left lower leg: No edema.  ?Skin: ?   General: Skin is warm and dry.  ?   Coloration: Skin is not jaundiced or pal

## 2022-01-11 NOTE — TOC Initial Note (Signed)
Transition of Care (TOC) - Initial/Assessment Note  ? ? ?Patient Details  ?Name: VONZELL LINDBLAD ?MRN: [id number] ?Date of Birth: 1936/05/26 ? ?Transition of Care (TOC) CM/SW Contact:    ?Truddie Hidden, RN ?Phone Number: ?01/11/2022, 12:25 PM ? ?Clinical Narrative:                 ? ?Transition of Care (TOC) Screening Note ? ? ?Patient Details  ?Name: RAIQUAN CHANDLER ?Date of Birth: Feb 17, 1936 ? ? ?Transition of Care (TOC) CM/SW Contact:    ?Truddie Hidden, RN ?Phone Number: ?01/11/2022, 12:25 PM ? ? ? ?Transition of Care Department [facility]) has reviewed patient and no TOC needs have been identified at this time. We will continue to monitor patient advancement through interdisciplinary progression rounds. If new patient transition needs arise, please place a TOC consult. ? ? ? ?  ?  ? ? ?Patient Goals and CMS Choice ?  ?  ?  ? ?Expected Discharge Plan and Services ?  ?  ?  ?  ?  ?                ?  ?  ?  ?  ?  ?  ?  ?  ?  ?  ? ?Prior Living Arrangements/Services ?  ?  ?  ?       ?  ?  ?  ?  ? ?Activities of Daily Living ?Home Assistive Devices/Equipment: Gilmer Mor (specify quad or straight), Hearing aid, Dentures (specify type) ?ADL Screening (condition at time of admission) ?Patient's cognitive ability adequate to safely complete daily activities?: Yes ?Is the patient deaf or have difficulty hearing?: No ?Does the patient have difficulty seeing, even when wearing glasses/contacts?: No ?Does the patient have difficulty concentrating, remembering, or making decisions?: No ?Patient able to express need for assistance with ADLs?: Yes ?Does the patient have difficulty dressing or bathing?: No ?Independently performs ADLs?: Yes (appropriate for developmental age) ?Does the patient have difficulty walking or climbing stairs?: No ?Weakness of Legs: None ?Weakness of Arms/Hands: None ? ?Permission Sought/Granted ?  ?  ?   ?   ?   ?   ? ?Emotional Assessment ?  ?  ?  ?  ?  ?  ? ?Admission diagnosis:  Acute cholecystitis [K81.0] ?Other  chest pain [R07.89] ?Cholecystitis [K81.9] ?Patient Active Problem List  ? Diagnosis Date Noted  ? Acute lower UTI 01/11/2022  ? Hypokalemia 01/11/2022  ? Dyslipidemia 01/11/2022  ? BPH (benign prostatic hyperplasia) 01/11/2022  ? GERD without esophagitis 01/11/2022  ? Loculated pleural effusion 01/11/2022  ? Atypical chest pain 01/11/2022  ? Acute cholecystitis 01/10/2022  ? Acute bronchitis 07/29/2015  ? Chronic diastolic CHF (congestive heart failure) (HCC) 01/28/2015  ? CHF (congestive heart failure) (HCC)   ? Coronary artery disease   ? Diarrhea 07/30/2014  ? Encounter for therapeutic drug monitoring 07/14/2014  ? Orthostatic hypotension 04/28/2014  ? Coronary atherosclerosis of native coronary artery 03/12/2013  ? SOB (shortness of breath) 03/12/2013  ? A-fib (HCC) 03/12/2013  ? Hyperlipidemia 03/12/2013  ? Cardiac pacemaker in situ 03/12/2013  ? Essential hypertension 03/12/2013  ? ?PCP:  Katrinka Blazing, MD ?Pharmacy:   ?[facility] - [address], [place] - [address] ?[address] ?[place] [address] ?Phone: [phone] Fax: [phone] ? ?[facility] - [place], [place] - [address] ?[address] ?[place] [address] ?Phone: [phone] Fax: [phone] ? ?  [facility] [id number] [place], [place] - [address] ?[address] ?[place] [address] ?Phone: [phone] Fax: [phone] ? ? ? ? ?Social Determinants of Health (SDOH) Interventions ?  ? ?Readmission Risk Interventions ?   ? View : No data to display.  ?  ?  ?  ? ? ? ?

## 2022-01-11 NOTE — Assessment & Plan Note (Addendum)
-   We will continue his Proscar and Flomax. ?

## 2022-01-12 ENCOUNTER — Encounter
Admission: EM | Disposition: A | Discharge status: Home / Self Care | Payer: Self-pay | Source: Home / Self Care | Attending: Internal Medicine

## 2022-01-12 ENCOUNTER — Encounter: Payer: Self-pay | Admitting: Family Medicine

## 2022-01-12 ENCOUNTER — Inpatient Hospital Stay: Payer: Medicare Other | Admitting: Anesthesiology

## 2022-01-12 DIAGNOSIS — K810 Acute cholecystitis: Secondary | ICD-10-CM | POA: Diagnosis not present

## 2022-01-12 DIAGNOSIS — K8000 Calculus of gallbladder with acute cholecystitis without obstruction: Secondary | ICD-10-CM | POA: Diagnosis not present

## 2022-01-12 HISTORY — PX: ROBOTIC ASSISTED LAPAROSCOPIC CHOLECYSTECTOMY: SHX6521

## 2022-01-12 LAB — PROTIME-INR
INR: 1.2 (ref 0.8–1.2)
Prothrombin Time: 15.4 seconds — ABNORMAL HIGH (ref 11.4–15.2)

## 2022-01-12 LAB — APTT: aPTT: 43 seconds — ABNORMAL HIGH (ref 24–36)

## 2022-01-12 SURGERY — CHOLECYSTECTOMY, ROBOT-ASSISTED, LAPAROSCOPIC
Anesthesia: General | Site: Abdomen

## 2022-01-12 MED ORDER — SUGAMMADEX SODIUM 200 MG/2ML IV SOLN
INTRAVENOUS | Status: DC | PRN
  Administered 2022-01-12: 200 mg via INTRAVENOUS

## 2022-01-12 MED ORDER — DROPERIDOL 2.5 MG/ML IJ SOLN: 0.6250 mg | Freq: Once | INTRAMUSCULAR | Status: DC | PRN

## 2022-01-12 MED ORDER — PROPOFOL 10 MG/ML IV BOLUS
INTRAVENOUS | Status: AC
  Filled 2022-01-12: qty 20

## 2022-01-12 MED ORDER — FENTANYL CITRATE (PF) 100 MCG/2ML IJ SOLN
INTRAMUSCULAR | Status: AC
  Filled 2022-01-12: qty 2

## 2022-01-12 MED ORDER — APIXABAN 5 MG PO TABS: 5.0000 mg | ORAL_TABLET | Freq: Two times a day (BID) | ORAL | Status: DC

## 2022-01-12 MED ORDER — PHENYLEPHRINE 80 MCG/ML (10ML) SYRINGE FOR IV PUSH (FOR BLOOD PRESSURE SUPPORT)
PREFILLED_SYRINGE | INTRAVENOUS | Status: AC
  Filled 2022-01-12: qty 10

## 2022-01-12 MED ORDER — OXYCODONE HCL 5 MG PO TABS: 5.0000 mg | ORAL_TABLET | Freq: Once | ORAL | Status: DC | PRN

## 2022-01-12 MED ORDER — PROPOFOL 10 MG/ML IV BOLUS
INTRAVENOUS | Status: DC | PRN
  Administered 2022-01-12: 120 mg via INTRAVENOUS

## 2022-01-12 MED ORDER — OXYCODONE HCL 5 MG/5ML PO SOLN: 5.0000 mg | Freq: Once | ORAL | Status: DC | PRN

## 2022-01-12 MED ORDER — LIDOCAINE HCL (CARDIAC) PF 100 MG/5ML IV SOSY
PREFILLED_SYRINGE | INTRAVENOUS | Status: DC | PRN
Start: 2022-01-12 — End: 2022-01-12
  Administered 2022-01-12: 80 mg via INTRAVENOUS

## 2022-01-12 MED ORDER — PROMETHAZINE HCL 25 MG/ML IJ SOLN: 6.2500 mg | INTRAMUSCULAR | Status: DC | PRN

## 2022-01-12 MED ORDER — BUPIVACAINE HCL (PF) 0.5 % IJ SOLN
INTRAMUSCULAR | Status: DC | PRN
  Administered 2022-01-12: 30 mL

## 2022-01-12 MED ORDER — FENTANYL CITRATE (PF) 100 MCG/2ML IJ SOLN
INTRAMUSCULAR | Status: AC
  Administered 2022-01-12: 50 ug via INTRAVENOUS
  Filled 2022-01-12: qty 2

## 2022-01-12 MED ORDER — ROCURONIUM BROMIDE 100 MG/10ML IV SOLN
INTRAVENOUS | Status: DC | PRN
Start: 2022-01-12 — End: 2022-01-12
  Administered 2022-01-12: 50 mg via INTRAVENOUS

## 2022-01-12 MED ORDER — FENTANYL CITRATE (PF) 100 MCG/2ML IJ SOLN
INTRAMUSCULAR | Status: DC | PRN
  Administered 2022-01-12 (×2): 50 ug via INTRAVENOUS

## 2022-01-12 MED ORDER — FENTANYL CITRATE (PF) 100 MCG/2ML IJ SOLN
25.0000 ug | INTRAMUSCULAR | Status: AC | PRN
  Administered 2022-01-12: 50 ug via INTRAVENOUS
  Administered 2022-01-12 (×3): 25 ug via INTRAVENOUS

## 2022-01-12 MED ORDER — PHENYLEPHRINE HCL (PRESSORS) 10 MG/ML IV SOLN
INTRAVENOUS | Status: DC | PRN
  Administered 2022-01-12 (×3): 80 ug via INTRAVENOUS

## 2022-01-12 MED ORDER — 0.9 % SODIUM CHLORIDE (POUR BTL) OPTIME
TOPICAL | Status: DC | PRN
  Administered 2022-01-12: 500 mL

## 2022-01-12 MED ORDER — LACTATED RINGERS IV SOLN: INTRAVENOUS | Status: DC | PRN

## 2022-01-12 MED ORDER — FENTANYL CITRATE (PF) 100 MCG/2ML IJ SOLN
INTRAMUSCULAR | Status: AC
  Administered 2022-01-12: 25 ug via INTRAVENOUS
  Filled 2022-01-12: qty 2

## 2022-01-12 MED ORDER — ACETAMINOPHEN 10 MG/ML IV SOLN: 1000.0000 mg | Freq: Once | INTRAVENOUS | Status: DC | PRN

## 2022-01-12 MED ORDER — SODIUM CHLORIDE 0.9 % IR SOLN
Status: DC | PRN
  Administered 2022-01-12: 3000 mL

## 2022-01-12 SURGICAL SUPPLY — 45 items
BAG RETRIEVAL 10 (BASKET) ×1
CLIP LIGATING HEM O LOK PURPLE (MISCELLANEOUS) ×2 IMPLANT
COVER TIP SHEARS 8 DVNC (MISCELLANEOUS) ×1 IMPLANT
COVER TIP SHEARS 8MM DA VINCI (MISCELLANEOUS) ×1
DERMABOND ADVANCED (GAUZE/BANDAGES/DRESSINGS) ×1
DERMABOND ADVANCED .7 DNX12 (GAUZE/BANDAGES/DRESSINGS) ×1 IMPLANT
DRAPE ARM DVNC X/XI (DISPOSABLE) ×4 IMPLANT
DRAPE COLUMN DVNC XI (DISPOSABLE) ×1 IMPLANT
DRAPE DA VINCI XI ARM (DISPOSABLE) ×4
DRAPE DA VINCI XI COLUMN (DISPOSABLE) ×1
ELECT CAUTERY BLADE 6.4 (BLADE) ×2 IMPLANT
GLOVE ORTHO TXT STRL SZ7.5 (GLOVE) ×4 IMPLANT
GOWN STRL REUS W/ TWL LRG LVL3 (GOWN DISPOSABLE) ×2 IMPLANT
GOWN STRL REUS W/ TWL XL LVL3 (GOWN DISPOSABLE) ×2 IMPLANT
GOWN STRL REUS W/TWL LRG LVL3 (GOWN DISPOSABLE) ×2
GOWN STRL REUS W/TWL XL LVL3 (GOWN DISPOSABLE) ×2
GRASPER SUT TROCAR 14GX15 (MISCELLANEOUS) ×1 IMPLANT
INFUSOR MANOMETER BAG 3000ML (MISCELLANEOUS) ×1 IMPLANT
IRRIGATOR SUCT 8 DISP DVNC XI (IRRIGATION / IRRIGATOR) IMPLANT
IRRIGATOR SUCTION 8MM XI DISP (IRRIGATION / IRRIGATOR) ×1
IV NS IRRIG 3000ML ARTHROMATIC (IV SOLUTION) ×1 IMPLANT
KIT PINK PAD W/HEAD ARE REST (MISCELLANEOUS) ×2
KIT PINK PAD W/HEAD ARM REST (MISCELLANEOUS) ×1 IMPLANT
KIT TURNOVER KIT A (KITS) ×2 IMPLANT
LABEL OR SOLS (LABEL) ×2 IMPLANT
MANIFOLD NEPTUNE II (INSTRUMENTS) ×2 IMPLANT
NEEDLE HYPO 22GX1.5 SAFETY (NEEDLE) ×2 IMPLANT
NS IRRIG 500ML POUR BTL (IV SOLUTION) ×2 IMPLANT
PACK LAP CHOLECYSTECTOMY (MISCELLANEOUS) ×2 IMPLANT
PENCIL ELECTRO HAND CTR (MISCELLANEOUS) ×2 IMPLANT
SEAL CANN UNIV 5-8 DVNC XI (MISCELLANEOUS) ×4 IMPLANT
SEAL XI 5MM-8MM UNIVERSAL (MISCELLANEOUS) ×4
SET TUBE SMOKE EVAC HIGH FLOW (TUBING) ×2 IMPLANT
SOL .9 NS 3000ML IRR  AL (IV SOLUTION) ×1
SOL .9 NS 3000ML IRR UROMATIC (IV SOLUTION) IMPLANT
SOLUTION ELECTROLUBE (MISCELLANEOUS) ×2 IMPLANT
SPIKE FLUID TRANSFER (MISCELLANEOUS) ×2 IMPLANT
SUT MNCRL 4-0 (SUTURE) ×2
SUT MNCRL 4-0 27XMFL (SUTURE) ×2
SUT VICRYL 0 AB UR-6 (SUTURE) ×4 IMPLANT
SUTURE MNCRL 4-0 27XMF (SUTURE) ×1 IMPLANT
SYS BAG RETRIEVAL 10MM (BASKET) ×1
SYSTEM BAG RETRIEVAL 10MM (BASKET) ×1 IMPLANT
TROCAR Z-THREAD FIOS 11X100 BL (TROCAR) ×1 IMPLANT
WATER STERILE IRR 500ML POUR (IV SOLUTION) ×2 IMPLANT

## 2022-01-12 NOTE — Anesthesia Postprocedure Evaluation (Signed)
Anesthesia Post Note ? ?Patient: Kyle Morales ? ?Procedure(s) Performed: XI ROBOTIC ASSISTED LAPAROSCOPIC CHOLECYSTECTOMY (Abdomen) ?INDOCYANINE GREEN FLUORESCENCE IMAGING (ICG) (Abdomen) ? ?Patient location during evaluation: PACU ?Anesthesia Type: General ?Level of consciousness: awake and alert ?Pain management: pain level controlled ?Vital Signs Assessment: post-procedure vital signs reviewed and stable ?Respiratory status: spontaneous breathing, nonlabored ventilation and respiratory function stable ?Cardiovascular status: blood pressure returned to baseline and stable ?Postop Assessment: no apparent nausea or vomiting ?Anesthetic complications: no ? ? ?No notable events documented. ? ? ?Last Vitals:  ?Vitals:  ? 01/12/22 1353 01/12/22 1444  ?BP: 104/65 116/76  ?Pulse: 70 69  ?Resp: 18 16  ?Temp: (!) 36.3 ?C 36.4 ?C  ?SpO2: 94% 96%  ?  ?Last Pain:  ?Vitals:  ? 01/12/22 1340  ?TempSrc:   ?PainSc: 1   ? ? ?  ?  ?  ?  ?  ?  ? ?Foye Deer ? ? ? ? ?

## 2022-01-12 NOTE — Progress Notes (Signed)
?PROGRESS NOTE ? ? ? ?Kyle Morales  [id number] DOB: 1936/03/03 DOA: 01/10/2022 ?PCP: Konrad Saha, MD  ? ? ?Brief Narrative:  ?86 year old male with history significant for chronic congestive heart failure, coronary artery disease, hyperlipidemia, hypertension, paroxysmal atrial fibrillation on chronic anticoagulation who presents with acute onset bilateral lower chest pain and epigastric abdominal pain associated with mild nausea and vomiting x2 days.  Vital signs stable.  Abdominal imaging reveals cholelithiasis concerning for cholecystitis.  Right upper quadrant ultrasound ordered and follow-up per surgery recommendations.  This reveals evidence concerning for acute cholecystitis with likely stone at the gallbladder neck. ?  ?General surgery consulted.  Plan for robotic assisted laparoscopic cholecystectomy 4/21 ? ? ?Assessment & Plan: ?  ?Principal Problem: ?  Acute cholecystitis ?Active Problems: ?  Acute lower UTI ?  Hypokalemia ?  A-fib ([place]) ?  Essential hypertension ?  Dyslipidemia ?  BPH (benign prostatic hyperplasia) ?  GERD without esophagitis ?  Loculated pleural effusion ?  Atypical chest pain ? ?Acute calculus cholecystitis ?Associated with cholelithiasis ?Normal LFTs, no obstructive jaundice ?Plan: ?OR today for laparoscopic cholecystectomy ?Continue Zosyn ?Pain control, nausea control ?Clear liquid diet with plans to advance quickly after return from the OR ?Pain control tentative plan discharge on 4/22 ? ?Acute lower UTI ?- This should be covered with IV Zosyn and urine culture will be followed. ?  ?Hypokalemia ?- Potassium will be replaced. ?  ?A-fib ([place]) ?- He has controlled rate. ?- We will continue his Toprol-XL. ?- We will hold off his Eliquis.  Can likely restart tomorrow 4/22 ?  ?Atypical chest pain ?- We will follow troponins.,  No delta, suspect supply demand ischemia ?- It is likely related to his acute cholecystitis ?  ?Loculated pleural effusion ?- This is on the right.  Will   need to be followed as outpatient ?--Currently on room air ?  ?GERD without esophagitis ?- We will continue his PPI therapy. ?  ?BPH (benign prostatic hyperplasia) ?- We will continue his Proscar and Flomax. ?  ?Dyslipidemia ?- We will continue statin therapy. ?  ?Essential hypertension ?- We will continue his antihypertensives ? ? ?DVT prophylaxis: SCD ?Code Status: Full ?Family Communication: Wife at bedside 4/21 ?Disposition Plan: Status is: Inpatient ?Remains inpatient appropriate because: Cholecystitis.  Laparoscopic cholecystectomy today.  Tentative plan discharge on 4/22 ? ? ?Level of care: Med-Surg ? ?Consultants:  ?General surgery ? ?Procedures:  ?Laparoscopic cholecystectomy 4/21 ? ?Antimicrobials: ?Zosyn ? ? ?Subjective: ?Seen and examined.  Resting in bed.  Abdominal pain resolved.  N.p.o. for OR today ? ?Objective: ?Vitals:  ? 01/12/22 0222 01/12/22 [id number] 01/12/22 0926 01/12/22 1242  ?BP: 104/76 108/67 114/72 109/67  ?Pulse: 73 77 70 69  ?Resp: 18  18 (!) 23  ?Temp: 99 ?F (37.2 ?C) 98.2 ?F (36.8 ?C) 98 ?F (36.7 ?C) 97.6 ?F (36.4 ?C)  ?TempSrc: Oral  Oral   ?SpO2: 95% 93% 94% 93%  ?Weight:      ?Height:      ? ? ?Intake/Output Summary (Last 24 hours) at 01/12/2022 1305 ?Last data filed at 01/12/2022 0418 ?Gross per 24 hour  ?Intake 1876.32 ml  ?Output 500 ml  ?Net 1376.32 ml  ? ?Filed Weights  ? 01/11/22 0051  ?Weight: 71.7 kg  ? ? ?Examination: ? ?General exam: Appears calm and comfortable  ?Respiratory system: Clear to auscultation. Respiratory effort normal. ?Cardiovascular system: S1-S2, RRR, no murmurs, no pedal edema ?Gastrointestinal system: Soft, NT/ND, normal bowel sounds ?Central nervous system: Alert and  oriented. No focal neurological deficits. ?Extremities: Symmetric 5 x 5 power. ?Skin: No rashes, lesions or ulcers ?Psychiatry: Judgement and insight appear normal. Mood & affect appropriate.  ? ? ? ?Data Reviewed: I have personally reviewed following labs and imaging studies ? ?CBC: ?Recent Labs   ?Lab 01/10/22 ?2039 01/11/22 ?0414  ?WBC 8.3 8.0  ?HGB 14.6 13.1  ?HCT 44.2 39.3  ?MCV 97.6 96.6  ?PLT 147* 140*  ? ?Basic Metabolic Panel: ?Recent Labs  ?Lab 01/10/22 ?2039 01/11/22 ?0414  ?NA 138 140  ?K 3.2* 3.5  ?CL 100 107  ?CO2 28 27  ?GLUCOSE 119* 134*  ?BUN 15 13  ?CREATININE 0.78 0.79  ?CALCIUM 9.2 8.7*  ? ?GFR: ?Estimated Creatinine Clearance: 71 mL/min (by C-G formula based on SCr of 0.79 mg/dL). ?Liver Function Tests: ?Recent Labs  ?Lab 01/10/22 ?2039 01/11/22 ?0414  ?AST 16 17  ?ALT 9 10  ?ALKPHOS 76 64  ?BILITOT 1.1 1.1  ?PROT 7.2 6.2*  ?ALBUMIN 4.2 3.6  ? ?Recent Labs  ?Lab 01/10/22 ?2039  ?LIPASE 29  ? ?No results for input(s): AMMONIA in the last 168 hours. ?Coagulation Profile: ?Recent Labs  ?Lab 01/10/22 ?2039 01/11/22 ?1006 01/12/22 ?0512  ?INR 1.2 1.3* 1.2  ? ?Cardiac Enzymes: ?No results for input(s): CKTOTAL, CKMB, CKMBINDEX, TROPONINI in the last 168 hours. ?BNP (last 3 results) ?No results for input(s): PROBNP in the last 8760 hours. ?HbA1C: ?No results for input(s): HGBA1C in the last 72 hours. ?CBG: ?No results for input(s): GLUCAP in the last 168 hours. ?Lipid Profile: ?No results for input(s): CHOL, HDL, LDLCALC, TRIG, CHOLHDL, LDLDIRECT in the last 72 hours. ?Thyroid Function Tests: ?No results for input(s): TSH, T4TOTAL, FREET4, T3FREE, THYROIDAB in the last 72 hours. ?Anemia Panel: ?No results for input(s): VITAMINB12, FOLATE, FERRITIN, TIBC, IRON, RETICCTPCT in the last 72 hours. ?Sepsis Labs: ?No results for input(s): PROCALCITON, LATICACIDVEN in the last 168 hours. ? ?No results found for this or any previous visit (from the past 240 hour(s)).  ? ? ? ? ? ?Radiology Studies: ?DG Chest 2 View ? ?Result Date: 01/10/2022 ?CLINICAL DATA:  Rib pain and back pain EXAM: CHEST - 2 VIEW COMPARISON:  09/08/2021 FINDINGS: Frontal and lateral views of the chest demonstrate stable dual lead pacer. Continued ectasia and atherosclerosis of the thoracic aorta. Cardiac silhouette is unremarkable.  There is a new rounded density within the right lower lobe measuring up to 3.5 cm on lateral view. CT is recommended for further evaluation. Chronic elevation of the right hemidiaphragm, with platelike atelectasis at the right lung base. No effusion or pneumothorax. Bilateral shoulder arthroplasties. IMPRESSION: 1. New rounded right lower lobe density measuring up to 3.5 cm, concerning for pulmonary nodule. CT chest may be useful for further evaluation. 2. Hypoventilatory changes at the right lung base. Electronically Signed   By: Randa Ngo M.D.   On: 01/10/2022 21:15  ? ?CT CHEST ABDOMEN PELVIS W CONTRAST ? ?Result Date: 01/10/2022 ?CLINICAL DATA:  Severe epigastric and lower chest pain, abnormal finding on chest x-ray EXAM: CT CHEST, ABDOMEN, AND PELVIS WITH CONTRAST TECHNIQUE: Multidetector CT imaging of the chest, abdomen and pelvis was performed following the standard protocol during bolus administration of intravenous contrast. RADIATION DOSE REDUCTION: This exam was performed according to the departmental dose-optimization program which includes automated exposure control, adjustment of the mA and/or kV according to patient size and/or use of iterative reconstruction technique. CONTRAST:  158mL OMNIPAQUE IOHEXOL 300 MG/ML  SOLN COMPARISON:  01/10/2022, 10/21/2017 FINDINGS: CT CHEST FINDINGS  Cardiovascular: Marked left atrial dilatation. No pericardial effusion. No thoracic aortic aneurysm or dissection. Moderate atherosclerosis of the aorta and coronary vasculature. Dual lead pacemaker is noted. Mediastinum/Nodes: No enlarged mediastinal, hilar, or axillary lymph nodes. Thyroid gland, trachea, and esophagus demonstrate no significant findings. Lungs/Pleura: There is a 2.6 x 2.4 cm area of loculated fluid within the right major fissure, corresponding to the chest x-ray finding. No acute airspace disease or pneumothorax. Scattered hypoventilatory changes at the lung bases. Central airways are patent.  Musculoskeletal: Displaced posterior right ninth rib fracture appears chronic. There are 2 fractures in the posterior right tenth rib. One appears chronic, and a second appears subacute with only minimal cal

## 2022-01-12 NOTE — Care Management Important Message (Signed)
Important Message ? ?Patient Details  ?Name: Kyle Morales ?MRN: [id number] ?Date of Birth: June 03, 1936 ? ? ?Medicare Important Message Given:  N/A - LOS <3 / Initial given by admissions ? ? ? ? ?Dannette Barbara ?01/12/2022, 9:54 AM ?

## 2022-01-12 NOTE — Transfer of Care (Signed)
Immediate Anesthesia Transfer of Care Note ? ?Patient: SPENSER HARREN ? ?Procedure(s) Performed: XI ROBOTIC ASSISTED LAPAROSCOPIC CHOLECYSTECTOMY (Abdomen) ?INDOCYANINE GREEN FLUORESCENCE IMAGING (ICG) (Abdomen) ? ?Patient Location: PACU ? ?Anesthesia Type:General ? ?Level of Consciousness: awake and alert  ? ?Airway & Oxygen Therapy: Patient Spontanous Breathing and Patient connected to face mask ? ?Post-op Assessment: Report given to RN ? ?Post vital signs: Reviewed and stable ? ?Last Vitals:  ?Vitals Value Taken Time  ?BP 109/67 01/12/22 1238  ?Temp    ?Pulse 70 01/12/22 1240  ?Resp 9 01/12/22 1240  ?SpO2 92 % 01/12/22 1240  ?Vitals shown include unvalidated device data. ? ?Last Pain:  ?Vitals:  ? 01/12/22 0926  ?TempSrc: Oral  ?PainSc: 0-No pain  ?   ? ?  ? ?Complications: No notable events documented. ?

## 2022-01-12 NOTE — Anesthesia Procedure Notes (Signed)
Procedure Name: Intubation ?Date/Time: 01/12/2022 10:40 AM ?Performed by: Lesle Reek, CRNA ?Pre-anesthesia Checklist: Patient identified, Patient being monitored, Timeout performed, Emergency Drugs available and Suction available ?Patient Re-evaluated:Patient Re-evaluated prior to induction ?Oxygen Delivery Method: Circle system utilized ?Preoxygenation: Pre-oxygenation with 100% oxygen ?Induction Type: IV induction ?Ventilation: Mask ventilation without difficulty ?Laryngoscope Size: Mac and 3 ?Grade View: Grade I ?Tube type: Oral ?Tube size: 7.0 mm ?Number of attempts: 1 ?Airway Equipment and Method: Stylet ?Placement Confirmation: ETT inserted through vocal cords under direct vision, positive ETCO2 and breath sounds checked- equal and bilateral ?Secured at: 21 cm ?Tube secured with: Tape ?Dental Injury: Teeth and Oropharynx as per pre-operative assessment  ? ? ? ? ?

## 2022-01-12 NOTE — Progress Notes (Signed)
Patient returned from surgery 

## 2022-01-12 NOTE — Progress Notes (Addendum)
[facility] ?SURGICAL PROGRESS NOTE ? ?Hospital Day(s): 2.  ? ?Interval History:  ?Patient seen and examined ?No acute events or new complaints overnight.  ?Patient reports he is doing well; abdominal pain resolved ?No fever, chills, nausea, emesis ?Labs are reassuring; INR normal ?He is NPO this morning ?Plan for robotic assisted laparoscopic cholecystectomy this morning ? ?Vital signs in last 24 hours: [min-max] current  ?Temp:  [97.7 ?F (36.5 ?C)-99.1 ?F (37.3 ?C)] 98.2 ?F (36.8 ?C) (04/21 5885) ?Pulse Rate:  [69-78] 77 (04/21 0718) ?Resp:  [16-18] 18 (04/21 0222) ?BP: (97-117)/(62-76) 108/67 (04/21 0277) ?SpO2:  [93 %-97 %] 93 % (04/21 0718)     Height: 5\' 11"  (180.3 cm) Weight: 71.7 kg BMI (Calculated): 22.05  ? ?Intake/Output last 2 shifts:  ?04/20 0701 - 04/21 0700 ?In: 1876.3 [I.V.:1498.3; IV Piggyback:378] ?Out: 500 [Urine:500]  ? ?Physical Exam:  ?Constitutional: alert, cooperative and no distress  ?Respiratory: breathing non-labored at rest  ?Cardiovascular: regular rate and sinus rhythm  ?Gastrointestinal: soft, non-tender, and non-distended ?Integumentary: warm, dry ? ?Labs:  ? ?  Latest Ref Rng & Units 01/11/2022  ?  4:14 AM 01/10/2022  ?  8:39 PM 08/21/2021  ?  1:43 PM  ?CBC  ?WBC 4.0 - 10.5 K/uL 8.0   8.3   6.5    ?Hemoglobin 13.0 - 17.0 g/dL 08/23/2021   41.2   87.8    ?Hematocrit 39.0 - 52.0 % 39.3   44.2   41.4    ?Platelets 150 - 400 K/uL 140   147   185    ? ? ?  Latest Ref Rng & Units 01/11/2022  ?  4:14 AM 01/10/2022  ?  8:39 PM 08/21/2021  ?  1:43 PM  ?CMP  ?Glucose 70 - 99 mg/dL 08/23/2021   720   94    ?BUN 8 - 23 mg/dL 13   15   17     ?Creatinine 0.61 - 1.24 mg/dL 947     0.96    ?Sodium 135 - 145 mmol/L 140   138   137    ?Potassium 3.5 - 5.1 mmol/L 3.5   3.2   3.6    ?Chloride 98 - 111 mmol/L 107   100   103    ?CO2 22 - 32 mmol/L 27   28   29     ?Calcium 8.9 - 10.3 mg/dL 8.7   9.2   9.0    ?Total Protein 6.5 - 8.1 g/dL 6.2   7.2     ?Total Bilirubin 0.3 - 1.2 mg/dL 1.1   1.1      ?Alkaline Phos 38 - 126 U/L 64   76     ?AST 15 - 41 U/L 17   16     ?ALT 0 - 44 U/L 10   9     ? ? ? ?Imaging studies: No new pertinent imaging studies ? ? ?Assessment/Plan: (ICD-10's: K81.0) ?86 y.o. male with cholelithiasis and likely cholecystitis, complicated by pertinent comorbidities including PAF on Eliquis, last taken 04/19. ?  ?           - Will plan on robotic assisted laparoscopic cholecystectomy today with Dr pending OR/Anesthesia availability  ?           - All risks, benefits, and alternatives to above procedure(s) were discussed with the patient, all of his questions were answered to his expressed satisfaction, patient expresses he wishes to proceed, and informed consent was obtained.   ?           -  ICG on call to OR ? - NPO this morning + IVF support ?- Okay for CLD immediately post-op and advance as tolerated ?- IV Abx (Rocephin, Flagyl) ?- Monitor abdominal examination; on-going bowel function ?- Pain control prn; antiemetics ?           - HOLD Eliquis; typically okay to restart 24-48 hours post-operatively ?           - Further management per primary service; we will follow  ? ? - Discharge Planning: May be able to DC home tomorrow, follow up in 2 weeks. I will update DC instruction for weekend team.  ?  ?All of the above findings and recommendations were discussed with the patient, and all of patient's questions were answered to his expressed satisfaction. ? ?-- ?Lynden Oxford, PA-C ?[facility] ?01/12/2022, 7:23 AM ?M-F: 7am - 4pm ? ?

## 2022-01-12 NOTE — Discharge Instructions (Signed)
In addition to included general post-operative instructions, ? ?Diet: Resume home diet. Recommend avoiding or limiting fatty/greasy foods over the next few days/week. If you do eat these, you may (or may not) notice diarrhea. This is expected while your body adjusts to not having a gallbladder, and it typically resolves with time.   ? ?Activity: No heavy lifting >20 pounds (children, pets, laundry, garbage) for 4 weeks, but light activity and walking are encouraged. Do not drive or drink alcohol if taking narcotic pain medications or having pain that might distract from driving. ? ?Wound care: 2 days after surgery (04/23), you may shower/get incision wet with soapy water and pat dry (do not rub incisions), but no baths or submerging incision underwater until follow-up.  ? ?Medications: Resume all home medications. For mild to moderate pain: acetaminophen (Tylenol) or ibuprofen/naproxen (if no kidney disease). Combining Tylenol with alcohol can substantially increase your risk of causing liver disease. Narcotic pain medications, if prescribed, can be used for severe pain, though may cause nausea, constipation, and drowsiness. Do not combine Tylenol and Percocet (or similar) within a 6 hour period as Percocet (and similar) contain(s) Tylenol. If you do not need the narcotic pain medication, you do not need to fill the prescription. ? ?Call office [phone] / [phone]) at any time if any questions, worsening pain, fevers/chills, bleeding, drainage from incision site, or other concerns. ? ?

## 2022-01-12 NOTE — Anesthesia Preprocedure Evaluation (Addendum)
Anesthesia Evaluation  ?Patient identified by MRN, date of birth, ID band ?Patient awake ? ? ? ?Reviewed: ?Allergy & Precautions, NPO status , Patient's Chart, lab work & pertinent test results ? ?History of Anesthesia Complications ?(+) PONV and history of anesthetic complications (states Nausea occured while recovering on the floor while taking pain medications) ? ?Airway ?Mallampati: III ? ?TM Distance: >3 FB ?Neck ROM: full ? ? ? Dental ? ?(+) Chipped ?  ?Pulmonary ?neg pulmonary ROS,  ?  ?Pulmonary exam normal ? ? ? ? ? ? ? Cardiovascular ?hypertension, Pt. on medications ?+ CAD, + Cardiac Stents (one stent placed greater than a year ago (1996)) and +CHF (DHFpEF 50-55%)  ?+ dysrhythmias (SSS (s/p Medtronic pacemaker) Atrial Fibrillation + pacemaker (evaluatated 2/23) + Valvular Problems/Murmurs MR  ? ?Generator located left infraclavicular chest area ? ?ECHO 2018: ?Echocardiogram @ [facility] 03/14/17 ?Study Conclusions  ? ?- Left ventricle: The cavity size was normal. There was moderate  ???concentric hypertrophy. Systolic function was normal. The  ???estimated ejection fraction was in the range of 50% to 55%. Wall  ???motion was normal; there were no regional wall motion  ???abnormalities. The study is not technically sufficient to allow  ???evaluation of LV diastolic function.  ?- Aorta: Aortic root dimension: 39 mm (ED).  ?- Mitral valve: There was mild to moderate?regurgitation.  ?- Left atrium: The atrium was severely dilated.  ?- Pulmonary arteries: Systolic pressure could not be accurately  ???estimated. ? ?  ?Neuro/Psych ?negative neurological ROS ? negative psych ROS  ? GI/Hepatic ?Neg liver ROS, GERD  ,Acute Cholecystitis ? ?1/11 barium swallow with silent aspiration. ?  ?Endo/Other  ?negative endocrine ROS ? Renal/GU ?  ? ?  ?Musculoskeletal ? ? Abdominal ?  ?Peds ? Hematology ?thrombocytopenia   ?Anesthesia Other Findings ?Past Medical History: ?No date: CHF (congestive  heart failure) (HCC) ?No date: Coronary artery disease ?No date: Eczema ?No date: Hyperlipidemia ?No date: Hypertension ?No date: Osteoarthrosis ?No date: Paroxysmal A-fib (HCC) ?No date: Presence of permanent cardiac pacemaker ?No date: S/P cardiac pacemaker procedure ? ?Past Surgical History: ?1997: CARDIAC CATHETERIZATION ?2000: CARDIAC CATHETERIZATION ?2012: COLONOSCOPY ?1997: CORONARY ANGIOPLASTY ?    Comment:  ramus ?02/11/2017: ESOPHAGOGASTRODUODENOSCOPY; N/A ?    Comment:  Procedure: ESOPHAGOGASTRODUODENOSCOPY (EGD);  Surgeon:  ?             Christena Deem, MD;  Location: [facility] ENDOSCOPY;   ?             Service: Endoscopy;  Laterality: N/A; ?No date: HERNIA REPAIR ?12/2005: INSERT / REPLACE / REMOVE PACEMAKER ?    Comment:  Medtronic [id number] - placed in [place] ?No date: MANDIBLE SURGERY ?No date: TONSILLECTOMY ?No date: TOTAL SHOULDER REPLACEMENT ?    Comment:  bilateral  ? ?BMI   ? Body Mass Index: 22.04 kg/m?  ?  ? ? Reproductive/Obstetrics ?negative OB ROS ? ?  ? ? ? ? ? ? ? ? ? ? ? ? ? ?  ?  ? ? ? ? ? ? ?Anesthesia Physical ?Anesthesia Plan ? ?ASA: 3 ? ?Anesthesia Plan: General ETT  ? ?Post-op Pain Management: Toradol IV (intra-op)*, Ofirmev IV (intra-op)* and Regional block*  ? ?Induction: Intravenous ? ?PONV Risk Score and Plan: 3 and Ondansetron, Dexamethasone and Treatment may vary due to age or medical condition ? ?Airway Management Planned: Oral ETT ? ?Additional Equipment:  ? ?Intra-op Plan:  ? ?Post-operative Plan: Extubation in OR ? ?Informed Consent: I have reviewed the patients History and Physical,  chart, labs and discussed the procedure including the risks, benefits and alternatives for the proposed anesthesia with the patient or authorized representative who has indicated his/her understanding and acceptance.  ? ? ? ?Dental advisory given ? ?Plan Discussed with: Anesthesiologist, CRNA and Surgeon ? ?Anesthesia Plan Comments:   ? ? ? ? ? ?Anesthesia Quick Evaluation ? ?

## 2022-01-12 NOTE — Op Note (Signed)
Robotic cholecystectomy with Indocyamine Green Ductal Imaging.  ? ?Pre-operative Diagnosis: Acute calculus cholecystitis ? ?Post-operative Diagnosis:  Same. ? ?Procedure: Robotic assisted laparoscopic cholecystectomy with Indocyamine Green Ductal Imaging.  ? ?Surgeon: Ronny Bacon, M.D., FACS ? ?Anesthesia: General. with endotracheal tube ? ?Findings: As expected marked inflammation and clear peritoneal reaction fluid, markedly thickened dilated gallbladder cystic duct junction, 2 large clips applied and securing the cystic duct stump.  ICG illuminated the common ductal system. ? ?Estimated Blood Loss: 25 mL ?        ?Drains: None ?        ?Specimens: Gallbladder     ?      ?Complications: none ? ?Procedure Details  ?The patient was seen again in the Holding Room.  2.5 mg dose of ICG was administered intravenously.   ?The benefits, complications, treatment options, risks and expected outcomes were again reviewed with the patient. The likelihood of improving the patient's symptoms with return to their baseline status is good.  The patient and/or family concurred with the proposed plan, giving informed consent, again alternatives reviewed.  The patient was taken to Operating Room, identified, and the procedure verified as robotic assisted laparoscopic cholecystectomy. ? ?Prior to the induction of general anesthesia, antibiotic prophylaxis was administered. VTE prophylaxis was in place. General endotracheal anesthesia was then administered and tolerated well. The patient was positioned in the supine position.  After the induction, the abdomen was prepped with Chloraprep and draped in the sterile fashion.  ?A Time Out was held and the above information confirmed. ? ?Right infra-umbilical local infiltration with quarter percent Marcaine with epinephrine is utilized.  Made a 12 mm incision on the right periumbilical site, I advanced an optical 47mm port under direct visualization into the peritoneal cavity.  Once the  peritoneum was penetrated, insufflation was initiated.  The trocar was then advanced into the abdominal cavity under direct visualization. ?Pneumoperitoneum was then continued with CO2 at 15 mmHg or less and tolerated well without any adverse changes in the patient's vital signs.  Two 8.5-mm ports were placed in the left lower quadrant and laterally, and one to the right lower quadrant, all under direct vision. All skin incisions  were infiltrated with a local anesthetic agent before making the incision and placing the trocars.  ?The patient was positioned  in reverse Trendelenburg, tilted the patient's left side down.  Da Vinci XI robot was then positioned on to the patient's left side, and docked. ? ?The gallbladder was identified, the fundus was hiding behind the transverse colon and omentum, once this was drawn inferiorly, the fundus was grasped via the arm 4 Prograsp and retracted cephalad. Adhesions were lysed with scissors and cautery.  The infundibulum was identified grasped and retracted laterally, exposing the peritoneum overlying the triangle of Calot. This was then opened and dissected using cautery & scissors.  I also made use of the robotic suction irrigator to help with the blunt dissection of this region as there was a significant amount of vascularity, edema and dilation of the cystic duct at gallbladder junction.  An extended critical view of the cystic duct and cystic artery was obtained, aided by the ICG via FireFly which enabled ready visualization of the common ductal anatomy.   ? ?The cystic duct was clearly identified and dissected to isolation.   Artery well isolated and clipped, and the cystic duct was double clipped and divided with scissors, as close to the gallbladder neck as feasible, thus leaving two on the remaining stump.  The specimen side of the artery is sealed with bipolar and divided with monopolar scissors.  ? ?The gallbladder was taken from the gallbladder fossa in a  retrograde fashion with the electrocautery. The gallbladder was removed and placed in an Endocatch bag.  ?The liver bed is inspected. Hemostasis was confirmed.  ?The robot was undocked and moved away from the operative field. ?Copious 2+ liters normal saline irrigation was utilized and was aspirated clear.  The gallbladder and Endocatch sac were then removed through the infraumbilical port site.  ? ?Inspection of the right upper quadrant was performed. No bleeding, bile duct injury or leak, or bowel injury was noted. The infra-umbilical port site fascia was closed with interrumpted 0 Vicryl sutures using PMI/cone under direct visualization. Pneumoperitoneum was released and ports removed.  4-0 subcuticular Monocryl was used to close the skin. Dermabond was  applied.  The patient was then extubated and brought to the recovery room in stable condition. Sponge, lap, and needle counts were correct at closure and at the conclusion of the case.  ?        ?     ?Ronny Bacon, M.D., FACS ?01/12/2022 12:37 PM  ?

## 2022-01-12 NOTE — Progress Notes (Signed)
Order received from Dr Georgeann Oppenheim to discontinue telemetry. Patient transported to the OR via bed ?

## 2022-01-13 DIAGNOSIS — K810 Acute cholecystitis: Secondary | ICD-10-CM | POA: Diagnosis not present

## 2022-01-13 LAB — CBC
HCT: 34.8 % — ABNORMAL LOW (ref 39.0–52.0)
Hemoglobin: 11.4 g/dL — ABNORMAL LOW (ref 13.0–17.0)
MCH: 32.5 pg (ref 26.0–34.0)
MCHC: 32.8 g/dL (ref 30.0–36.0)
MCV: 99.1 fL (ref 80.0–100.0)
Platelets: 137 10*3/uL — ABNORMAL LOW (ref 150–400)
RBC: 3.51 MIL/uL — ABNORMAL LOW (ref 4.22–5.81)
RDW: 12.7 % (ref 11.5–15.5)
WBC: 6.7 10*3/uL (ref 4.0–10.5)
nRBC: 0 % (ref 0.0–0.2)

## 2022-01-13 LAB — BASIC METABOLIC PANEL
Anion gap: 4 — ABNORMAL LOW (ref 5–15)
BUN: 13 mg/dL (ref 8–23)
CO2: 24 mmol/L (ref 22–32)
Calcium: 8.1 mg/dL — ABNORMAL LOW (ref 8.9–10.3)
Chloride: 110 mmol/L (ref 98–111)
Creatinine, Ser: 0.79 mg/dL (ref 0.61–1.24)
GFR, Estimated: >60 mL/min (ref >60)
Glucose, Bld: 98 mg/dL (ref 70–99)
Potassium: 4 mmol/L (ref 3.5–5.1)
Sodium: 138 mmol/L (ref 135–145)

## 2022-01-13 LAB — MAGNESIUM: Magnesium: 1.8 mg/dL (ref 1.7–2.4)

## 2022-01-13 MED ORDER — CIPROFLOXACIN HCL 500 MG PO TABS: 500.0000 mg | ORAL_TABLET | Freq: Two times a day (BID) | ORAL | 0 refills | Status: AC

## 2022-01-13 MED ORDER — HYDROCODONE-ACETAMINOPHEN 5-325 MG PO TABS: 1.0000 | ORAL_TABLET | Freq: Four times a day (QID) | ORAL | 0 refills | Status: DC | PRN

## 2022-01-13 MED ORDER — METRONIDAZOLE 500 MG PO TABS: 500.0000 mg | ORAL_TABLET | Freq: Three times a day (TID) | ORAL | 0 refills | Status: AC

## 2022-01-13 NOTE — Plan of Care (Signed)
  Problem: Health Behavior/Discharge Planning: Goal: Ability to manage health-related needs will improve Outcome: Progressing   Problem: Pain Managment: Goal: General experience of comfort will improve Outcome: Progressing   Problem: Safety: Goal: Ability to remain free from injury will improve Outcome: Progressing   Problem: Skin Integrity: Goal: Risk for impaired skin integrity will decrease Outcome: Progressing   

## 2022-01-13 NOTE — Progress Notes (Signed)
Subjective:  ?CC: ?SINGLETON Kyle Morales is a 86 y.o. male  Hospital stay day 3, 1 Day Post-Op robotic assisted laparoscopic cholecystectomy secondary to acute cholecystitis ? ?HPI: ?No issues overnight. ? ?ROS:  ?General: Denies weight loss, weight gain, fatigue, fevers, chills, and night sweats. ?Heart: Denies chest pain, palpitations, racing heart, irregular heartbeat, leg pain or swelling, and decreased activity tolerance. ?Respiratory: Denies breathing difficulty, shortness of breath, wheezing, cough, and sputum. ?GI: Denies change in appetite, heartburn, nausea, vomiting, constipation, diarrhea, and blood in stool. ?GU: Denies difficulty urinating, pain with urinating, urgency, frequency, blood in urine. ? ? ?Objective:  ? ?Temp:  [97.3 ?F (36.3 ?C)-98.8 ?F (37.1 ?C)] 97.5 ?F (36.4 ?C) (04/22 [id number]) ?Pulse Rate:  [68-75] 70 (04/22 0737) ?Resp:  [9-28] 18 (04/22 0737) ?BP: (90-121)/(60-82) 95/60 (04/22 0737) ?SpO2:  [88 %-99 %] 94 % (04/22 0737)     Height: 5\' 11"  (180.3 cm) Weight: 71.7 kg BMI (Calculated): 22.05  ? ?Intake/Output this shift:  ? ?Intake/Output Summary (Last 24 hours) at 01/13/2022 1209 ?Last data filed at 01/13/2022 1024 ?Gross per 24 hour  ?Intake 1783.68 ml  ?Output 300 ml  ?Net 1483.68 ml  ? ? ?Constitutional :  alert, cooperative, appears stated age, and no distress  ?Respiratory:  clear to auscultation bilaterally  ?Cardiovascular:  regular rate and rhythm  ?Gastrointestinal: Soft, no guarding, moderate amount of bruising noted at all port incision sites but minimal tenderness to palpation.  No sign of active bleeding. Marland Kitchen   ?Skin: Cool and moist.   ?Psychiatric: Normal affect, non-agitated, not confused  ?   ?  ?LABS:  ? ?  Latest Ref Rng & Units 01/13/2022  ?  7:52 AM 01/11/2022  ?  4:14 AM 01/10/2022  ?  8:39 PM  ?CMP  ?Glucose 70 - 99 mg/dL 98   134   119    ?BUN 8 - 23 mg/dL 13   13   15     ?Creatinine 0.61 - 1.24 mg/dL 0.79   0.79   0.78    ?Sodium 135 - 145 mmol/L 138   140   138    ?Potassium  3.5 - 5.1 mmol/L 4.0   3.5   3.2    ?Chloride 98 - 111 mmol/L 110   107   100    ?CO2 22 - 32 mmol/L 24   27   28     ?Calcium 8.9 - 10.3 mg/dL 8.1   8.7   9.2    ?Total Protein 6.5 - 8.1 g/dL  6.2   7.2    ?Total Bilirubin 0.3 - 1.2 mg/dL  1.1   1.1    ?Alkaline Phos 38 - 126 U/L  64   76    ?AST 15 - 41 U/L  17   16    ?ALT 0 - 44 U/L  10   9    ? ? ?  Latest Ref Rng & Units 01/13/2022  ?  7:52 AM 01/11/2022  ?  4:14 AM 01/10/2022  ?  8:39 PM  ?CBC  ?WBC 4.0 - 10.5 K/uL 6.7   8.0   8.3    ?Hemoglobin 13.0 - 17.0 g/dL 11.4   13.1   14.6    ?Hematocrit 39.0 - 52.0 % 34.8   39.3   44.2    ?Platelets 150 - 400 K/uL 137   140   147    ? ? ?RADS: ?N/a ?Assessment:  ? ?S/p robotic assisted laparoscopic cholecystectomy for acute cholecystitis.  Healing well.  Bruising as expected for patient on anticoagulation prior to surgery. ? ?Patient otherwise tolerating a regular diet.  Okay to be discharged from surgery standpoint.  Resume anticoagulation in a couple days due to the bruising noted around port sites.  Recommend completing course of antibiotics at home as well due to the degree of inflammation and infection noted at time of surgery per op report. ? ?labs/images/medications/previous chart entries reviewed personally and relevant changes/updates noted above. ? ? ?

## 2022-01-13 NOTE — Discharge Summary (Signed)
Physician Discharge Summary  ?Kyle Morales [id number] DOB: [birth year] DOA: 01/10/2022 ? ?PCP: Konrad Saha, MD ? ?Admit date: 01/10/2022 ?Discharge date: 01/13/2022 ? ?Admitted From: Home ?Disposition: Home ? ?Recommendations for Outpatient Follow-up:  ?Follow up with PCP in 1-2 weeks ?Follow-up with general surgery 2 weeks ? ?Home Health: No ?Equipment/Devices: None ? ?Discharge Condition: Stable ?CODE STATUS: Full ?Diet recommendation: Regular ? ?Brief/Interim Summary: ? ?86 year old male with history significant for chronic congestive heart failure, coronary artery disease, hyperlipidemia, hypertension, paroxysmal atrial fibrillation on chronic anticoagulation who presents with acute onset bilateral lower chest pain and epigastric abdominal pain associated with mild nausea and vomiting x2 days.  Vital signs stable.  Abdominal imaging reveals cholelithiasis concerning for cholecystitis.  Right upper quadrant ultrasound ordered and follow-up per surgery recommendations.  This reveals evidence concerning for acute cholecystitis with likely stone at the gallbladder neck. ?  ?General surgery consulted.  Status post robotic assisted laparoscopic cholecystectomy 4/21.  Tolerated procedure well.  No immediate complications.  Seen by general surgery in consultation 4/22.  Cleared for discharge.  Discussion regarding anticoagulation.  Patient with some fairly significant bruising near surgical site.  Recommend to hold home anticoagulation until 4/24.  Can restart at that time.  Antibiotics ciprofloxacin and metronidazole sent by surgical consultant as well as a as needed pain medication.  Stable for discharge home. ? ? ?Discharge Diagnoses:  ?Principal Problem: ?  Acute cholecystitis ?Active Problems: ?  Acute lower UTI ?  Hypokalemia ?  A-fib ([place]) ?  Essential hypertension ?  Dyslipidemia ?  BPH (benign prostatic hyperplasia) ?  GERD without esophagitis ?  Loculated pleural effusion ?  Atypical chest pain ? ?Acute  calculus cholecystitis ?Associated with cholelithiasis ?Normal LFTs, no obstructive jaundice ?Status post laparoscopic cholecystectomy 4/21 ?Tolerated procedure well, no complications ?Cleared for discharge on postoperative day #1 by general surgery ?Plan: ?DC home.  Discontinue Zosyn.  Start ciprofloxacin/metronidazole.  Complete additional 5 days.  As needed pain and nausea control.  Okay for diet.  Given ecchymoses near surgical site will recommend patient hold his Eliquis until Monday 4/24.  Can restart at that time.  Discussed with patient at length at bedside.  All questions answered ? ?Discharge Instructions ? ?Discharge Instructions   ? ? Diet - low sodium heart healthy   Complete by: As directed ?  ? Increase activity slowly   Complete by: As directed ?  ? ?  ? ?Allergies as of 01/13/2022   ? ?   Reactions  ? Amoxicillin Rash, Itching  ? Bactrim [sulfamethoxazole-trimethoprim] Rash  ? ?  ? ?  ?Medication List  ?  ? ?STOP taking these medications   ? ?albuterol 108 (90 Base) MCG/ACT inhaler ?Commonly known as: VENTOLIN HFA ?  ? ?  ? ?TAKE these medications   ? ?acetaminophen 500 MG tablet ?Commonly known as: TYLENOL ?Take 500 mg by mouth every 8 (eight) hours as needed. ?  ?apixaban 5 MG Tabs tablet ?Commonly known as: Eliquis ?Take 1 tablet (5 mg total) by mouth 2 (two) times daily. ?Notes to patient: RESUME ON Monday 01/15/22 ?  ?ciprofloxacin 500 MG tablet ?Commonly known as: CIPRO ?Take 1 tablet (500 mg total) by mouth 2 (two) times daily for 5 days. ?  ?[employer] Zinc 50 MG tablet ?Generic drug: zinc gluconate ?Take 50 mg by mouth daily. ?  ?finasteride 5 MG tablet ?Commonly known as: PROSCAR ?Take 5 mg by mouth daily. ?  ?fluticasone 50 MCG/ACT nasal spray ?Commonly known as: FLONASE ?Place  2 sprays into both nostrils 2 (two) times daily. ?  ?furosemide 40 MG tablet ?Commonly known as: LASIX ?Take by mouth. 3 tablets daily and may take 4 tablets if needed ?  ?HYDROcodone-acetaminophen 5-325 MG tablet ?Commonly  known as: Norco ?Take 1 tablet by mouth every 6 (six) hours as needed for up to 6 doses for moderate pain. ?  ?ipratropium 17 MCG/ACT inhaler ?Commonly known as: ATROVENT HFA ?Inhale 2 puffs into the lungs 4 (four) times daily. ?  ?lovastatin 20 MG tablet ?Commonly known as: MEVACOR ?Take 1 tablet (20 mg total) by mouth at bedtime. ?  ?metoprolol succinate 50 MG 24 hr tablet ?Commonly known as: TOPROL-XL ?Take 1 tablet (50 mg total) by mouth in the morning and at bedtime. Take with or immediately following a meal. ?  ?metoprolol tartrate 25 MG tablet ?Commonly known as: LOPRESSOR ?Take 1 tablet (25 mg total) by mouth 2 (two) times daily as needed. Take as needed for palpitations, HR > 100 ?  ?metroNIDAZOLE 500 MG tablet ?Commonly known as: Flagyl ?Take 1 tablet (500 mg total) by mouth 3 (three) times daily for 5 days. ?  ?nitroGLYCERIN 0.4 MG SL tablet ?Commonly known as: NITROSTAT ?Place 1 tablet (0.4 mg total) under the tongue every 5 (five) minutes as needed for chest pain. ?  ?omeprazole 20 MG capsule ?Commonly known as: PRILOSEC ?Take 20 mg by mouth as needed. ?  ?potassium chloride SA 20 MEQ tablet ?Commonly known as: KLOR-CON M ?Take 1 tablet (20 mEq total) by mouth daily. Take with lasix ?  ?predniSONE 50 MG tablet ?Commonly known as: DELTASONE ?Take 1 tablet (50 mg total) by mouth daily with breakfast. ?  ?tamsulosin 0.4 MG Caps capsule ?Commonly known as: FLOMAX ?Take 0.8 mg by mouth daily after breakfast. ?  ? ?  ? ? Follow-up Information   ? ? Tylene Fantasia, PA-C. Schedule an appointment as soon as possible for a visit in 3 week(s).   ?Specialty: Physician Assistant ?Why: Follow up in ~3 weeks, s/p laparoscopic cholecystectomy ?Contact information: ?[address] ?[address] ?[place] [address] ?[phone] ? ? ?  ?  ? ?  ?  ? ?  ? ?Allergies  ?Allergen Reactions  ? Amoxicillin Rash and Itching  ? Bactrim [Sulfamethoxazole-Trimethoprim] Rash  ? ? ?Consultations: ?General  surgery ? ? ?Procedures/Studies: ?DG Chest 2 View ? ?Result Date: 01/10/2022 ?CLINICAL DATA:  Rib pain and back pain EXAM: CHEST - 2 VIEW COMPARISON:  09/08/2021 FINDINGS: Frontal and lateral views of the chest demonstrate stable dual lead pacer. Continued ectasia and atherosclerosis of the thoracic aorta. Cardiac silhouette is unremarkable. There is a new rounded density within the right lower lobe measuring up to 3.5 cm on lateral view. CT is recommended for further evaluation. Chronic elevation of the right hemidiaphragm, with platelike atelectasis at the right lung base. No effusion or pneumothorax. Bilateral shoulder arthroplasties. IMPRESSION: 1. New rounded right lower lobe density measuring up to 3.5 cm, concerning for pulmonary nodule. CT chest may be useful for further evaluation. 2. Hypoventilatory changes at the right lung base. Electronically Signed   By: Randa Ngo M.D.   On: 01/10/2022 21:15  ? ?CT CHEST ABDOMEN PELVIS W CONTRAST ? ?Result Date: 01/10/2022 ?CLINICAL DATA:  Severe epigastric and lower chest pain, abnormal finding on chest x-ray EXAM: CT CHEST, ABDOMEN, AND PELVIS WITH CONTRAST TECHNIQUE: Multidetector CT imaging of the chest, abdomen and pelvis was performed following the standard protocol during bolus administration of intravenous contrast. RADIATION DOSE  REDUCTION: This exam was performed according to the departmental dose-optimization program which includes automated exposure control, adjustment of the mA and/or kV according to patient size and/or use of iterative reconstruction technique. CONTRAST:  159mL OMNIPAQUE IOHEXOL 300 MG/ML  SOLN COMPARISON:  01/10/2022, 10/21/2017 FINDINGS: CT CHEST FINDINGS Cardiovascular: Marked left atrial dilatation. No pericardial effusion. No thoracic aortic aneurysm or dissection. Moderate atherosclerosis of the aorta and coronary vasculature. Dual lead pacemaker is noted. Mediastinum/Nodes: No enlarged mediastinal, hilar, or axillary lymph  nodes. Thyroid gland, trachea, and esophagus demonstrate no significant findings. Lungs/Pleura: There is a 2.6 x 2.4 cm area of loculated fluid within the right major fissure, corresponding to the chest x-ray finding. No acute airsp

## 2022-01-13 NOTE — Progress Notes (Signed)
Kyle Morales to be D/C'd Home per MD order.  Discussed prescriptions and follow up appointments with the patient. Prescriptions given to patient, medication list explained in detail. Pt verbalized understanding. ? ?Allergies as of 01/13/2022   ? ?   Reactions  ? Amoxicillin Rash, Itching  ? Bactrim [sulfamethoxazole-trimethoprim] Rash  ? ?  ? ?  ?Medication List  ?  ? ?STOP taking these medications   ? ?albuterol 108 (90 Base) MCG/ACT inhaler ?Commonly known as: VENTOLIN HFA ?  ? ?  ? ?TAKE these medications   ? ?acetaminophen 500 MG tablet ?Commonly known as: TYLENOL ?Take 500 mg by mouth every 8 (eight) hours as needed. ?  ?apixaban 5 MG Tabs tablet ?Commonly known as: Eliquis ?Take 1 tablet (5 mg total) by mouth 2 (two) times daily. ?Notes to patient: RESUME ON Monday 01/15/22 ?  ?ciprofloxacin 500 MG tablet ?Commonly known as: CIPRO ?Take 1 tablet (500 mg total) by mouth 2 (two) times daily for 5 days. ?  ?[employer] Zinc 50 MG tablet ?Generic drug: zinc gluconate ?Take 50 mg by mouth daily. ?  ?finasteride 5 MG tablet ?Commonly known as: PROSCAR ?Take 5 mg by mouth daily. ?  ?fluticasone 50 MCG/ACT nasal spray ?Commonly known as: FLONASE ?Place 2 sprays into both nostrils 2 (two) times daily. ?  ?furosemide 40 MG tablet ?Commonly known as: LASIX ?Take by mouth. 3 tablets daily and may take 4 tablets if needed ?  ?HYDROcodone-acetaminophen 5-325 MG tablet ?Commonly known as: Norco ?Take 1 tablet by mouth every 6 (six) hours as needed for up to 6 doses for moderate pain. ?  ?ipratropium 17 MCG/ACT inhaler ?Commonly known as: ATROVENT HFA ?Inhale 2 puffs into the lungs 4 (four) times daily. ?  ?lovastatin 20 MG tablet ?Commonly known as: MEVACOR ?Take 1 tablet (20 mg total) by mouth at bedtime. ?  ?metoprolol succinate 50 MG 24 hr tablet ?Commonly known as: TOPROL-XL ?Take 1 tablet (50 mg total) by mouth in the morning and at bedtime. Take with or immediately following a meal. ?  ?metoprolol tartrate 25 MG  tablet ?Commonly known as: LOPRESSOR ?Take 1 tablet (25 mg total) by mouth 2 (two) times daily as needed. Take as needed for palpitations, HR > 100 ?  ?metroNIDAZOLE 500 MG tablet ?Commonly known as: Flagyl ?Take 1 tablet (500 mg total) by mouth 3 (three) times daily for 5 days. ?  ?nitroGLYCERIN 0.4 MG SL tablet ?Commonly known as: NITROSTAT ?Place 1 tablet (0.4 mg total) under the tongue every 5 (five) minutes as needed for chest pain. ?  ?omeprazole 20 MG capsule ?Commonly known as: PRILOSEC ?Take 20 mg by mouth as needed. ?  ?potassium chloride SA 20 MEQ tablet ?Commonly known as: KLOR-CON M ?Take 1 tablet (20 mEq total) by mouth daily. Take with lasix ?  ?predniSONE 50 MG tablet ?Commonly known as: DELTASONE ?Take 1 tablet (50 mg total) by mouth daily with breakfast. ?  ?tamsulosin 0.4 MG Caps capsule ?Commonly known as: FLOMAX ?Take 0.8 mg by mouth daily after breakfast. ?  ? ?  ? ? ?Vitals:  ? 01/13/22 0443 01/13/22 0737  ?BP: 94/64 95/60  ?Pulse: 71 70  ?Resp: 18 18  ?Temp: 98.8 ?F (37.1 ?C) (!) 97.5 ?F (36.4 ?C)  ?SpO2: 95% 94%  ? ? ?Skin clean, dry and intact without evidence of skin break down, no evidence of skin tears noted. IV catheter discontinued intact. Site without signs and symptoms of complications. Dressing and pressure applied. Pt denies  pain at this time. No complaints noted. ? ?An After Visit Summary was printed and given to the patient. ?Patient escorted via WC, and D/C home via private auto. ? ?Madie Reno, RN  ? ?

## 2022-01-14 ENCOUNTER — Other Ambulatory Visit: Payer: Self-pay

## 2022-01-15 LAB — SURGICAL PATHOLOGY

## 2022-01-30 ENCOUNTER — Ambulatory Visit (INDEPENDENT_AMBULATORY_CARE_PROVIDER_SITE_OTHER): Payer: Medicare Other

## 2022-01-30 DIAGNOSIS — I495 Sick sinus syndrome: Secondary | ICD-10-CM | POA: Diagnosis not present

## 2022-01-31 LAB — CUP PACEART REMOTE DEVICE CHECK
Battery Remaining Longevity: 42 mo
Battery Voltage: 2.97 V
Brady Statistic AP VP Percent: 1.48 %
Brady Statistic AP VS Percent: 67.07 %
Brady Statistic AS VP Percent: 4.16 %
Brady Statistic AS VS Percent: 27.29 %
Brady Statistic RA Percent Paced: 67.8 %
Brady Statistic RV Percent Paced: 5.9 %
Date Time Interrogation Session: 20230510070741
Implantable Lead Implant Date: 20070411
Implantable Lead Implant Date: 20070411
Implantable Lead Location: 753859
Implantable Lead Location: 753860
Implantable Lead Model: 5076
Implantable Lead Model: 5076
Implantable Pulse Generator Implant Date: 20170224
Lead Channel Impedance Value: 342 Ohm
Lead Channel Impedance Value: 361 Ohm
Lead Channel Impedance Value: 399 Ohm
Lead Channel Impedance Value: 456 Ohm
Lead Channel Pacing Threshold Amplitude: 0.875 V
Lead Channel Pacing Threshold Amplitude: 1.375 V
Lead Channel Pacing Threshold Pulse Width: 0.4 ms
Lead Channel Pacing Threshold Pulse Width: 0.4 ms
Lead Channel Sensing Intrinsic Amplitude: 1.875 mV
Lead Channel Sensing Intrinsic Amplitude: 1.875 mV
Lead Channel Sensing Intrinsic Amplitude: 4.75 mV
Lead Channel Sensing Intrinsic Amplitude: 4.75 mV
Lead Channel Setting Pacing Amplitude: 2 V
Lead Channel Setting Pacing Amplitude: 2.75 V
Lead Channel Setting Pacing Pulse Width: 0.4 ms
Lead Channel Setting Sensing Sensitivity: 0.9 mV

## 2022-02-01 ENCOUNTER — Encounter: Payer: Self-pay | Admitting: Surgery

## 2022-02-01 ENCOUNTER — Other Ambulatory Visit: Payer: Self-pay

## 2022-02-01 ENCOUNTER — Ambulatory Visit (INDEPENDENT_AMBULATORY_CARE_PROVIDER_SITE_OTHER): Payer: Medicare Other | Admitting: Surgery

## 2022-02-01 VITALS — BP 109/75 | HR 80 | Temp 97.7°F | Ht 71.0 in | Wt 159.6 lb

## 2022-02-01 DIAGNOSIS — Z9049 Acquired absence of other specified parts of digestive tract: Secondary | ICD-10-CM

## 2022-02-01 DIAGNOSIS — Z09 Encounter for follow-up examination after completed treatment for conditions other than malignant neoplasm: Secondary | ICD-10-CM

## 2022-02-01 DIAGNOSIS — K4090 Unilateral inguinal hernia, without obstruction or gangrene, not specified as recurrent: Secondary | ICD-10-CM

## 2022-02-01 DIAGNOSIS — K8000 Calculus of gallbladder with acute cholecystitis without obstruction: Secondary | ICD-10-CM

## 2022-02-01 NOTE — Patient Instructions (Signed)

## 2022-02-01 NOTE — Progress Notes (Signed)
[facility] ?POST-OP OFFICE VISIT ? ?02/01/2022 ? ?HPI: ?Kyle Morales is a 86 y.o. male 20 days s/p robotic cholecystectomy.  For gangrenous cholecystitis with cholelithiasis.  He reports having no issues, doing well, eating well, no fevers no chills, no bowel habit issues.  We discussed anticipated changes or adjustments due to not having a gallbladder.  We also discussed that he has a longstanding, and well-known left groin hernia that he is pursuing repair and would like to now have it completed here in [place]. ? ?Vital signs: ?BP 109/75   Pulse 80   Temp 97.7 ?F (36.5 ?C) (Oral)   Ht 5\' 11"  (1.803 m)   Wt 159 lb 9.6 oz (72.4 kg)   SpO2 97%   BMI 22.26 kg/m?   ? ?Physical Exam: ?Constitutional: Appears well. ?Abdomen: Incisions are clean, dry and intact abdomen is soft and nontender.  There is a large wide-based left groin bulge that is consistent with a large left inguinal hernia and is reducible with minimal discomfort. ? ? ?Assessment/Plan: ?This is a 86 y.o. male 20 days s/p robotic cholecystectomy. ? ?Patient Active Problem List  ? Diagnosis Date Noted  ? Acute lower UTI 01/11/2022  ? Hypokalemia 01/11/2022  ? Dyslipidemia 01/11/2022  ? BPH (benign prostatic hyperplasia) 01/11/2022  ? GERD without esophagitis 01/11/2022  ? Loculated pleural effusion 01/11/2022  ? Atypical chest pain 01/11/2022  ? Acute cholecystitis 01/10/2022  ? Abnormal barium swallow 12/07/2021  ? At risk for aspiration 12/07/2021  ? Change in voice 12/07/2021  ? Bilateral recurrent inguinal hernia without obstruction or gangrene 09/26/2021  ? Current use of long term anticoagulation 09/26/2021  ? S/P hip replacement 07/25/2021  ? Left inguinal hernia 07/25/2021  ? Dysphagia 06/22/2016  ? Low back pain 06/22/2016  ? Tachy-brady syndrome (HCC) 10/22/2015  ? IBS (irritable bowel syndrome) 09/14/2015  ? Acute bronchitis 07/29/2015  ? Chronic diastolic CHF (congestive heart failure) (HCC) 01/28/2015  ? Dermatitis  fungal 12/14/2014  ? Nephrolithiasis 12/01/2014  ? CHF (congestive heart failure) (HCC)   ? Coronary artery disease   ? Diarrhea 07/30/2014  ? Encounter for therapeutic drug monitoring 07/14/2014  ? Numbness and tingling in left hand 05/11/2014  ? Orthostatic hypotension 04/28/2014  ? Leg swelling 12/08/2013  ? Disequilibrium 06/02/2013  ? Coronary atherosclerosis of native coronary artery 03/12/2013  ? SOB (shortness of breath) 03/12/2013  ? A-fib (HCC) 03/12/2013  ? Hyperlipidemia 03/12/2013  ? Cardiac pacemaker in situ 03/12/2013  ? Essential hypertension 03/12/2013  ? Depression 01/20/2013  ? Stasis dermatitis of both legs 01/20/2013  ? Myopia 09/05/2012  ? Presbyopia 09/05/2012  ? Tear film insufficiency 09/05/2012  ? Vitreous opacity 09/05/2012  ? Primary localized osteoarthrosis, lower leg 11/29/2010  ? History of nonmelanoma skin cancer 10/25/2010  ? Myalgia and myositis 12/04/2005  ? ? -They would like to defer pursuing hernia repair till August, and of asked that will schedule follow-up visit or sooner if needed. ? ? ?September M.D., FACS ?02/01/2022, 12:13 PM  ?

## 2022-02-13 ENCOUNTER — Ambulatory Visit: Payer: Medicare Other | Attending: Student | Admitting: Speech Pathology

## 2022-02-13 DIAGNOSIS — R1314 Dysphagia, pharyngoesophageal phase: Secondary | ICD-10-CM | POA: Diagnosis present

## 2022-02-13 DIAGNOSIS — R490 Dysphonia: Secondary | ICD-10-CM | POA: Diagnosis not present

## 2022-02-13 NOTE — Progress Notes (Signed)
Remote pacemaker transmission.   

## 2022-02-15 ENCOUNTER — Ambulatory Visit: Payer: Medicare Other | Admitting: Speech Pathology

## 2022-02-15 DIAGNOSIS — R490 Dysphonia: Secondary | ICD-10-CM

## 2022-02-15 DIAGNOSIS — R1314 Dysphagia, pharyngoesophageal phase: Secondary | ICD-10-CM

## 2022-02-15 NOTE — Therapy (Signed)
[facility] [address] [place], [place], [address] Phone: [phone]   Fax:  [phone]  Speech Language Pathology Evaluation  Patient Details  Name: Kyle Morales MRN: [id number] Date of Birth: 1935/12/26 Referring Provider (SLP): Adaline SillKaren Halpert   Encounter Date: 02/13/2022   End of Session - 02/15/22 1642     Visit Number 1    Number of Visits 17    Date for SLP Re-Evaluation 04/12/22    Authorization Type Medicare    Authorization Time Period 02/13/2022 thru 04/12/2022    Authorization - Visit Number 1    Progress Note Due on Visit 10    SLP Start Time 0900    SLP Stop Time  1000    SLP Time Calculation (min) 60 min    Activity Tolerance Patient tolerated treatment well             Past Medical History:  Diagnosis Date   CHF (congestive heart failure) (HCC)    Coronary artery disease    Eczema    Hyperlipidemia    Hypertension    Osteoarthrosis    Paroxysmal A-fib (HCC)    Presence of permanent cardiac pacemaker    S/P cardiac pacemaker procedure     Past Surgical History:  Procedure Laterality Date   CARDIAC CATHETERIZATION  1997   CARDIAC CATHETERIZATION  2000   COLONOSCOPY  2012   CORONARY ANGIOPLASTY  1997   ramus   ESOPHAGOGASTRODUODENOSCOPY N/A 02/11/2017   Procedure: ESOPHAGOGASTRODUODENOSCOPY (EGD);  Surgeon: Christena DeemSkulskie, Martin U, MD;  [facility];  Service: Endoscopy;  Laterality: N/A;   HERNIA REPAIR     INSERT / REPLACE / REMOVE PACEMAKER  12/2005   Medtronic [id number] - placed in [place]   MANDIBLE SURGERY     TONSILLECTOMY     TOTAL SHOULDER REPLACEMENT     bilateral     There were no vitals filed for this visit.   Subjective Assessment - 02/15/22 1555     Subjective Pt accompanied by his wife, multiple questions about previous studies    Patient is accompained by: Family member    Currently in Pain? No/denies                SLP Evaluation [facility] -  02/15/22 1555       SLP Visit Information   SLP Received On 02/13/22    Referring Provider (SLP) Adaline SillKaren Halpert    Onset Date 02/07/2022   referral date, chronic ~ 2018   Medical Diagnosis Voice/dysphagia      General Information   HPI Pt is an 86 year old male who presents for evaluation of his voice. Pt referred by his PCP, Dr Adaline SillKaren Halpert as pt's "voice fades." Pt with otolaryngology appt on 02/27/2022 with Dr Rella Larveupali Harvie BridgeNavin [facility] Pt's medical history remarkable for chronic presbyesophagus and more recently dx of severe pharyngoesophageal dysphagia.       DG Esophagus - 10/24/2016   There were prominent tertiary contractions observed intermittently.    Changes of presbyesophagus. Mild narrowing at the GE junction which delayed passage of the barium tablet until the patient took an  additional sip of water. If the patient's symptoms or worsen, upper  endoscopy with consideration of possible dilation may be useful.      DG Esophagus - 10/04/2021   Laryngeal penetration and silent aspiration occurred with thick barium.  Intermittent tertiary contractions again noted - presbyesophagus, similar to prior study.  Modified Barium Swallow Study - 11/17/2021  Moderate to severe pharyngoesophageal phase dysphagia that results in intermittent silent aspiration of all consistencies. 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 This impedes bolus transit thru the esophagus and results in partial backflow of all boluses into the pharynx. It is then that the bolus fills the pyriform sinuses with intermittent spillage into trachea and is silently aspirated. Unfortunately diet and liquid modification was not helpful in altering this dysfunction. Pt with wet vocal quality and moderate dysphonia.        Recent hospitalization with chest x-ray (01/10/2022) concerning for new rounded density within the  right lower lobe measuring up to 3.5 cm on lateral view. CT is recommended for further evaluation. Chronic elevation of the right hemidiaphragm, with platelike atelectasis at the right lung base. Follow up chest x-ray revealed Small area of loculated pleural fluid within the right major  fissure, corresponding to the x-ray finding.    Behavioral/Cognition appropriate    Mobility Status ambulatory      Balance Screen   Has the patient fallen in the past 6 months No    Has the patient had a decrease in activity level because of a fear of falling?  No    Is the patient reluctant to leave their home because of a fear of falling?  No      Prior Functional Status   Cognitive/Linguistic Baseline Within functional limits      Motor Speech   Overall Motor Speech Impaired    Phonation Impaired    Vocal Abuses Habitual Cough/Throat Clear    Volume Soft    Pitch High                             SLP Education - 02/15/22 1642     Education Details provided on barium and modified barium studies    Person(s) Educated Patient;Spouse    Methods Explanation;Demonstration;Handout    Comprehension Verbalized understanding;Returned demonstration              SLP Short Term Goals - 02/15/22 1643       SLP SHORT TERM GOAL #1   Title Pt will demonstrate understanding of aspiration and dysphagia.    Time 10    Period --   sessions   Status New      SLP SHORT TERM GOAL #2   Title Pt will demonstrate understanding of ENT report.    Time 10    Period --   sessions   Status New              SLP Long Term Goals - 02/15/22 1644       SLP LONG TERM GOAL #1   Title Pt will improve vocal hygiene with Mod I assistance.    Time 8    Period Weeks    Status New    Target Date 04/12/22              Plan - 02/15/22 1643     Clinical Impression Statement Pt presents with wet vocal quality, continual throat clears, moderate dysphonia that is c/b as hoarse, fading with  reduced speech intelligibility of ~ 75% at the sentence level in a quiet environment. He and his wife further describe his voice as fading at the end of sentences with his wife constantly having to ask  pt repeat himself. In addition, pt no longer has the ability to project his voice. In addition to these symptoms, pt and his wife voiced frustration with lack of understanding of pt's dysphonia as well as his swallowing problems. SLP provided detailed written information on the results as well as video playback of pt's barium swallow (10/04/2021) and Modified Barium Swallow Study (11/17/2021). It appears that pt continues with chronic presbyesophagus, explanation and definition provided.  SLP provided detailed education on ways to reduce aspiration risks such as continuing to be physical active, eat/drink sitting upright and remain upright for 45 minutes after eating/drinking, sleep with HOB elevated.    Speech Therapy Frequency 2x / week    Duration 8 weeks    Treatment/Interventions SLP instruction and feedback;Patient/family education    Potential to Achieve Goals Fair    Potential Considerations Co-morbidities;Previous level of function    Consulted and Agree with Plan of Care Patient;Family member/caregiver    Family Member Consulted pt's wife             Patient will benefit from skilled therapeutic intervention in order to improve the following deficits and impairments:   Dysphonia  Dysphagia, pharyngoesophageal phase    Problem List Patient Active Problem List   Diagnosis Date Noted   Status post laparoscopic cholecystectomy 02/01/2022   Acute lower UTI 01/11/2022   Hypokalemia 01/11/2022   Dyslipidemia 01/11/2022   BPH (benign prostatic hyperplasia) 01/11/2022   GERD without esophagitis 01/11/2022   Loculated pleural effusion 01/11/2022   Atypical chest pain 01/11/2022   Abnormal barium swallow 12/07/2021   At risk for aspiration 12/07/2021   Change in voice 12/07/2021    Bilateral recurrent inguinal hernia without obstruction or gangrene 09/26/2021   Current use of long term anticoagulation 09/26/2021   S/P hip replacement 07/25/2021   Left inguinal hernia 07/25/2021   Dysphagia 06/22/2016   Low back pain 06/22/2016   Tachy-brady syndrome (HCC) 10/22/2015   IBS (irritable bowel syndrome) 09/14/2015   Acute bronchitis 07/29/2015   Chronic diastolic CHF (congestive heart failure) (HCC) 01/28/2015   Dermatitis fungal 12/14/2014   Nephrolithiasis 12/01/2014   CHF (congestive heart failure) (HCC)    Coronary artery disease    Diarrhea 07/30/2014   Encounter for therapeutic drug monitoring 07/14/2014   Numbness and tingling in left hand 05/11/2014   Orthostatic hypotension 04/28/2014   Leg swelling 12/08/2013   Disequilibrium 06/02/2013   Coronary atherosclerosis of native coronary artery 03/12/2013   SOB (shortness of breath) 03/12/2013   A-fib (HCC) 03/12/2013   Hyperlipidemia 03/12/2013   Cardiac pacemaker in situ 03/12/2013   Essential hypertension 03/12/2013   Depression 01/20/2013   Stasis dermatitis of both legs 01/20/2013   Myopia 09/05/2012   Presbyopia 09/05/2012   Tear film insufficiency 09/05/2012   Vitreous opacity 09/05/2012   Primary localized osteoarthrosis, lower leg 11/29/2010   History of nonmelanoma skin cancer 10/25/2010   Myalgia and myositis 12/04/2005    B. Dreama Saa M.S., CCC-SLP, CBIS Speech-Language Pathologist Certified Brain Injury Specialist [facility]  [facility] [phone] Ascom [phone] Fax [phone]  Leo Rod 02/15/2022, 4:50 PM  [facility] MAIN [facility] SERVICES [address] [place], [place], [address] Phone: [phone]   Fax:  [phone]  Name: Kyle Morales MRN: [id number] Date of Birth: 06-13-1936

## 2022-02-16 NOTE — Therapy (Addendum)
[facility] MAIN [facility] SERVICES [address] [place], [place], [address] Phone: [phone]   Fax:  [phone]  Speech Language Pathology Treatment  Patient Details  Name: Kyle Morales MRN: [id number] Date of Birth: Sep 14, 1936 Referring Provider (SLP): Adaline Sill   Encounter Date: 02/15/2022   End of Session - 02/16/22 0943     Visit Number 2    Number of Visits 17    Date for SLP Re-Evaluation 04/12/22    Authorization Type Medicare    Authorization Time Period 02/13/2022 thru 04/12/2022    Authorization - Visit Number 2    Progress Note Due on Visit 10    SLP Start Time 1500    SLP Stop Time  1600    SLP Time Calculation (min) 60 min    Activity Tolerance Patient tolerated treatment well             Past Medical History:  Diagnosis Date   CHF (congestive heart failure) (HCC)    Coronary artery disease    Eczema    Hyperlipidemia    Hypertension    Osteoarthrosis    Paroxysmal A-fib (HCC)    Presence of permanent cardiac pacemaker    S/P cardiac pacemaker procedure     Past Surgical History:  Procedure Laterality Date   CARDIAC CATHETERIZATION  1997   CARDIAC CATHETERIZATION  2000   COLONOSCOPY  2012   CORONARY ANGIOPLASTY  1997   ramus   ESOPHAGOGASTRODUODENOSCOPY N/A 02/11/2017   Procedure: ESOPHAGOGASTRODUODENOSCOPY (EGD);  Surgeon: Christena Deem, MD;  Location: [facility] ENDOSCOPY;  Service: Endoscopy;  Laterality: N/A;   HERNIA REPAIR     INSERT / REPLACE / REMOVE PACEMAKER  12/2005   Medtronic [id number] - placed in [place]   MANDIBLE SURGERY     TONSILLECTOMY     TOTAL SHOULDER REPLACEMENT     bilateral     There were no vitals filed for this visit.   Subjective Assessment - 02/16/22 0939     Subjective "I am dizzy today"    Currently in Pain? No/denies                   ADULT SLP TREATMENT - 02/16/22 0001       Cognitive-Linquistic Treatment   Treatment focused on  Voice;Patient/family/caregiver education    Skilled Teacher, adult education.   SUBJECTIVE:   REFERRING PROVIDER: PCP, Adaline Sill  PERTINENT HISTORY: Chronic presbyesophagus; severe pharyngoesophageal dysphagia   LIVING ENVIRONMENT: Lives with: lives with their spouse Lives in: House/apartment  PLOF:  Level of assistance: Independent with ADLs Employment: Retired  PATIENT GOALS  to have a stronger voice   OBJECTIVE:     COGNITION: Overall cognitive status: Within functional limits for tasks assessed ORAL MOTOR ASSESSMENT:   WFL   PERCEPTUAL VOICE EVALUATION:     Voice Case History    Health risks:    Pt is a  Non-smoker Caffeine use approx. 8 oz daily, Daily H20 intake average  30 oz, GERD and Insufficient water intake   Characteristic voice use: uses voice approximately 3 hours per day for conversation   Environmental risks: None reported  Occupational risks: None identified   Misuse: Excessively high pitch, Speaks without adequate breath support, and Speaks on residual capacity   Phonotraumatic behaviors: Excessive and/or habitual throat clearing   Vocal characteristics: Vocal fatigue, Limited pitch range, Reduced volume, Reduced ability to project, High pitch, Hoarseness, and Breathiness  Objective Voice Measurements  Maximum phonation time for sustained "ah": 13.5   Average fundamental frequency during sustained "ah": 173   (1.5 SD above average of  145 Hz +/- 23 for gender)   Average dB for sustained "ah": 77     Habitual pitch: 176 Hz  Average dB in conversational speech:  77dB    Highest dynamic pitch in conversational speech: 217 Hz   Lowest dynamic pitch in conversational speech: 107 Hz   Average time patient was able to sustain /s/:  8.3 seconds   Average time patient was able to sustain /z/: 4.5 seconds   s/z ratio:  (suggestive of dysfunction > 1.0) 1.8   PATIENT REPORTED OUTCOME MEASURES  (PROM):  VHI Score:  The Voice Handicap Index is comprised of a series of questions to assess the patient's perception of their voice. It is designed to evaluate the emotional, physical and functional components of the voice problem.    Functional: 20 Physical: 24 Emotional: 10 Total: 54 (Normal mean 8.75, SD =14.97)   z score =  3.0 - severe = 3.00+                 SLP Education - 02/16/22 0942     Education Details results of voice evaluation, placing pt on hold pending ENT appt on 02/27/2022    Person(s) Educated Patient    Methods Explanation;Demonstration;Verbal cues    Comprehension Verbalized understanding;Returned demonstration;Need further instruction              SLP Short Term Goals - 02/15/22 1643       SLP SHORT TERM GOAL #1   Title Pt will demonstrate understanding of aspiration and dysphagia.    Time 10    Period --   sessions   Status New      SLP SHORT TERM GOAL #2   Title Pt will demonstrate understanding of ENT report.    Time 10    Period --   sessions   Status New              SLP Long Term Goals - 02/15/22 1644       SLP LONG TERM GOAL #1   Title Pt will improve vocal hygiene with Mod I assistance.    Time 8    Period Weeks    Status New    Target Date 04/12/22              Plan - 02/16/22 0943     Clinical Impression Statement Pt in agreement with being placed on hold until ENT evaluation. Will communicate all of pt's information to ENT provider prior to pt's appt. Will determine future ST POC based on ENT findings.    Speech Therapy Frequency --   on hold until 03/01/2022   Duration 8 weeks    Treatment/Interventions SLP instruction and feedback;Patient/family education    Potential to Achieve Goals Fair    Potential Considerations Co-morbidities;Previous level of function;Severity of impairments    Consulted and Agree with Plan of Care Patient             Patient will benefit from skilled therapeutic  intervention in order to improve the following deficits and impairments:   Dysphonia  Dysphagia, pharyngoesophageal phase    Problem List Patient Active Problem List   Diagnosis Date Noted   Status post laparoscopic cholecystectomy 02/01/2022   Acute lower UTI 01/11/2022   Hypokalemia 01/11/2022   Dyslipidemia 01/11/2022   BPH (benign prostatic hyperplasia) 01/11/2022   GERD  without esophagitis 01/11/2022   Loculated pleural effusion 01/11/2022   Atypical chest pain 01/11/2022   Abnormal barium swallow 12/07/2021   At risk for aspiration 12/07/2021   Change in voice 12/07/2021   Bilateral recurrent inguinal hernia without obstruction or gangrene 09/26/2021   Current use of long term anticoagulation 09/26/2021   S/P hip replacement 07/25/2021   Left inguinal hernia 07/25/2021   Dysphagia 06/22/2016   Low back pain 06/22/2016   Tachy-brady syndrome (HCC) 10/22/2015   IBS (irritable bowel syndrome) 09/14/2015   Acute bronchitis 07/29/2015   Chronic diastolic CHF (congestive heart failure) (HCC) 01/28/2015   Dermatitis fungal 12/14/2014   Nephrolithiasis 12/01/2014   CHF (congestive heart failure) (HCC)    Coronary artery disease    Diarrhea 07/30/2014   Encounter for therapeutic drug monitoring 07/14/2014   Numbness and tingling in left hand 05/11/2014   Orthostatic hypotension 04/28/2014   Leg swelling 12/08/2013   Disequilibrium 06/02/2013   Coronary atherosclerosis of native coronary artery 03/12/2013   SOB (shortness of breath) 03/12/2013   A-fib (HCC) 03/12/2013   Hyperlipidemia 03/12/2013   Cardiac pacemaker in situ 03/12/2013   Essential hypertension 03/12/2013   Depression 01/20/2013   Stasis dermatitis of both legs 01/20/2013   Myopia 09/05/2012   Presbyopia 09/05/2012   Tear film insufficiency 09/05/2012   Vitreous opacity 09/05/2012   Primary localized osteoarthrosis, lower leg 11/29/2010   History of nonmelanoma skin cancer 10/25/2010   Myalgia and  myositis 12/04/2005    B. Dreama Saaverton, M.S., CCC-SLP, CBIS Speech-Language Pathologist Certified Brain Injury Specialist [facility]  [facility] Office [phone] Ascom [phone] Fax [phone]  Leo Rod , CCC-SLP 02/16/2022, 9:49 AM  [facility]IN [facility] [address] [place], [place], [address] Phone: [phone]   Fax:  [phone]   Name: Kyle Morales MRN: [id number] Date of Birth: 1935-10-13

## 2022-02-20 ENCOUNTER — Ambulatory Visit: Payer: Medicare Other | Admitting: Speech Pathology

## 2022-02-22 ENCOUNTER — Ambulatory Visit: Payer: Medicare Other | Admitting: Speech Pathology

## 2022-03-01 ENCOUNTER — Ambulatory Visit: Payer: Medicare Other | Admitting: Speech Pathology

## 2022-03-02 ENCOUNTER — Ambulatory Visit: Payer: Medicare Other | Admitting: Speech Pathology

## 2022-03-05 ENCOUNTER — Ambulatory Visit: Payer: Medicare Other | Admitting: Speech Pathology

## 2022-03-07 ENCOUNTER — Ambulatory Visit: Payer: Medicare Other | Admitting: Speech Pathology

## 2022-03-09 ENCOUNTER — Ambulatory Visit: Payer: Medicare Other | Attending: Student | Admitting: Speech Pathology

## 2022-03-09 DIAGNOSIS — R1314 Dysphagia, pharyngoesophageal phase: Secondary | ICD-10-CM | POA: Insufficient documentation

## 2022-03-09 DIAGNOSIS — R1312 Dysphagia, oropharyngeal phase: Secondary | ICD-10-CM | POA: Insufficient documentation

## 2022-03-09 DIAGNOSIS — R490 Dysphonia: Secondary | ICD-10-CM | POA: Insufficient documentation

## 2022-03-09 NOTE — Therapy (Signed)
[facility] MAIN [facility] SERVICES [address] [place], [place], [address] Phone: [phone]   Fax:  [phone]  Speech Language Pathology Treatment  Patient Details  Name: Kyle Morales MRN: [id number] Date of Birth: Aug 09, 1936 Referring Provider (SLP): Adaline Sill   Encounter Date: 03/09/2022   End of Session - 03/09/22 1318     Visit Number 3    Number of Visits 17    Date for SLP Re-Evaluation 04/12/22    Authorization Type Medicare    Authorization Time Period 02/13/2022 thru 04/12/2022    Authorization - Visit Number 3    Progress Note Due on Visit 10    SLP Start Time 1100    SLP Stop Time  1215    SLP Time Calculation (min) 75 min    Activity Tolerance Patient tolerated treatment well             Past Medical History:  Diagnosis Date   CHF (congestive heart failure) (HCC)    Coronary artery disease    Eczema    Hyperlipidemia    Hypertension    Osteoarthrosis    Paroxysmal A-fib (HCC)    Presence of permanent cardiac pacemaker    S/P cardiac pacemaker procedure     Past Surgical History:  Procedure Laterality Date   CARDIAC CATHETERIZATION  1997   CARDIAC CATHETERIZATION  2000   COLONOSCOPY  2012   CORONARY ANGIOPLASTY  1997   ramus   ESOPHAGOGASTRODUODENOSCOPY N/A 02/11/2017   Procedure: ESOPHAGOGASTRODUODENOSCOPY (EGD);  Surgeon: Christena Deem, MD;  Location: [facility] ENDOSCOPY;  Service: Endoscopy;  Laterality: N/A;   HERNIA REPAIR     INSERT / REPLACE / REMOVE PACEMAKER  12/2005   Medtronic [id number] - placed in [place]   MANDIBLE SURGERY     TONSILLECTOMY     TOTAL SHOULDER REPLACEMENT     bilateral     There were no vitals filed for this visit.   Subjective Assessment - 03/09/22 1317     Subjective pt pleasant, accompanied by his wife - "I am so confused about everything"    Patient is accompained by: Family member    Currently in Pain? No/denies                   ADULT SLP  TREATMENT - 03/09/22 0001       Cognitive-Linquistic Treatment   Treatment focused on Voice;Patient/family/caregiver education    Skilled Treatment Pt with recent evaluation by otolaryngologist, Dr Martina Sinner, as well as voice evaluation by Speech Pathologist at [facility] on 02/27/2022. 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 Behavioral Voice Therapy recommended.       Results of this evaluation were shared with pt as well as possible effective that pt's midcord gap might have on pt's pharyngeal phase dysphagia. Pt interested in pursuing voice therapy at [facility] as it is local for pt and his wife. After 4-6 weeks of voice therapy, recommend another Modified Barium Swallow Study after completion of voice therapy in the hopes that pt might have improved glottic closure.       In addition, SLP facilitated session by introducing and instructing pt in EMST exercises. EMST initially set at [facility] with 2 out of 10 effort level reported. EMST increased to ~45cmH2O with increased effort level to 7 out of 10. Pt required moderate verbal cues to slow reps and focus on strong blows each time.  SLP Education - 03/09/22 1318     Education Details results of otolaryngology evaluation, ST POC, EMST    Person(s) Educated Patient;Spouse    Methods Explanation;Demonstration;Verbal cues;Handout    Comprehension Verbalized understanding;Returned demonstration;Need further instruction;Verbal cues required              SLP Short Term Goals - 02/15/22 1643       SLP SHORT TERM GOAL #1   Title Pt will demonstrate understanding of aspiration and dysphagia.    Time 10    Period --   sessions   Status New      SLP SHORT TERM GOAL #2   Title Pt will demonstrate understanding of ENT report.    Time 10    Period --   sessions   Status New              SLP Long Term Goals - 02/15/22 1644       SLP LONG TERM GOAL #1   Title Pt will  improve vocal hygiene with Mod I assistance.    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  I recommend skilled ST to train pt in vocal hygiene, improve breath support for voice and reduce laryngeal tension in order to improve vocal quality, endurance, and quality of life.    Speech Therapy Frequency 2x / week    Duration 8 weeks    Treatment/Interventions SLP instruction and feedback;Patient/family education    Potential to Achieve Goals Fair    Potential Considerations Co-morbidities;Previous level of function;Severity of impairments    SLP Home Exercise Plan provided, see pt instructions section    Consulted and Agree with Plan of Care Patient;Family member/caregiver    Family Member Consulted pt's wife             Patient will benefit from skilled therapeutic intervention in order to improve the following deficits and impairments:   Dysphonia  Muscle tension dysphonia    Problem List Patient Active Problem List   Diagnosis Date Noted   Status post laparoscopic cholecystectomy 02/01/2022   Acute lower UTI 01/11/2022   Hypokalemia 01/11/2022   Dyslipidemia 01/11/2022   BPH (benign prostatic hyperplasia) 01/11/2022   GERD without esophagitis 01/11/2022   Loculated pleural effusion 01/11/2022   Atypical chest pain 01/11/2022   Abnormal barium swallow 12/07/2021   At risk for aspiration 12/07/2021   Change in voice 12/07/2021   Bilateral recurrent inguinal hernia without obstruction or gangrene 09/26/2021   Current use of long term anticoagulation 09/26/2021   S/P hip replacement 07/25/2021   Left inguinal hernia 07/25/2021   Dysphagia 06/22/2016   Low back pain 06/22/2016   Tachy-brady syndrome (HCC) 10/22/2015   IBS (irritable bowel syndrome) 09/14/2015   Acute  bronchitis 07/29/2015   Chronic diastolic CHF (congestive heart failure) (HCC) 01/28/2015   Dermatitis fungal 12/14/2014   Nephrolithiasis 12/01/2014   CHF (congestive heart failure) (HCC)    Coronary artery disease    Diarrhea 07/30/2014   Encounter for therapeutic drug monitoring 07/14/2014   Numbness and tingling in left hand 05/11/2014   Orthostatic hypotension 04/28/2014   Leg swelling 12/08/2013   Disequilibrium 06/02/2013   Coronary atherosclerosis of native coronary artery 03/12/2013  SOB (shortness of breath) 03/12/2013   A-fib (HCC) 03/12/2013   Hyperlipidemia 03/12/2013   Cardiac pacemaker in situ 03/12/2013   Essential hypertension 03/12/2013   Depression 01/20/2013   Stasis dermatitis of both legs 01/20/2013   Myopia 09/05/2012   Presbyopia 09/05/2012   Tear film insufficiency 09/05/2012   Vitreous opacity 09/05/2012   Primary localized osteoarthrosis, lower leg 11/29/2010   History of nonmelanoma skin cancer 10/25/2010   Myalgia and myositis 12/04/2005    B. Dreama Saa M.S., CCC-SLP, CBIS Speech-Language Pathologist Certified Brain Injury Specialist [facility]  [facility] [phone] Ascom [phone] Fax [phone]  Reuel Derby, Franchot Erichsen 03/09/2022, 1:22 PM  [facility] MAIN [facility] SERVICES [address] [place], [place], [address] Phone: [phone]   Fax:  [phone]   Name: Kyle Morales MRN: [id number] Date of Birth: [birth year]

## 2022-03-12 ENCOUNTER — Ambulatory Visit: Payer: Medicare Other | Admitting: Speech Pathology

## 2022-03-13 ENCOUNTER — Ambulatory Visit: Payer: Medicare Other | Admitting: Speech Pathology

## 2022-03-13 DIAGNOSIS — R490 Dysphonia: Secondary | ICD-10-CM

## 2022-03-14 NOTE — Therapy (Signed)
[facility] MAIN [facility] SERVICES [address] [place], [place], [address] Phone: [phone]   Fax:  [phone]  Speech Language Pathology Treatment  Patient Details  Name: Kyle Morales MRN: [id number] Date of Birth: 09/06/1936 Referring Provider (SLP): Adaline Sill   Encounter Date: 03/13/2022   End of Session - 03/14/22 1306     Visit Number 5    Number of Visits 17    Date for SLP Re-Evaluation 04/12/22    Authorization Type Medicare    Authorization Time Period 02/13/2022 thru 04/12/2022    Authorization - Visit Number 5    Progress Note Due on Visit 10    SLP Start Time 0900    SLP Stop Time  1000    SLP Time Calculation (min) 60 min    Activity Tolerance Patient tolerated treatment well             Past Medical History:  Diagnosis Date   CHF (congestive heart failure) (HCC)    Coronary artery disease    Eczema    Hyperlipidemia    Hypertension    Osteoarthrosis    Paroxysmal A-fib (HCC)    Presence of permanent cardiac pacemaker    S/P cardiac pacemaker procedure     Past Surgical History:  Procedure Laterality Date   CARDIAC CATHETERIZATION  1997   CARDIAC CATHETERIZATION  2000   COLONOSCOPY  2012   CORONARY ANGIOPLASTY  1997   ramus   ESOPHAGOGASTRODUODENOSCOPY N/A 02/11/2017   Procedure: ESOPHAGOGASTRODUODENOSCOPY (EGD);  Surgeon: Christena Deem, MD;  Location: [facility] ENDOSCOPY;  Service: Endoscopy;  Laterality: N/A;   HERNIA REPAIR     INSERT / REPLACE / REMOVE PACEMAKER  12/2005   Medtronic [id number] - placed in [place]   MANDIBLE SURGERY     TONSILLECTOMY     TOTAL SHOULDER REPLACEMENT     bilateral     There were no vitals filed for this visit.   Subjective Assessment - 03/14/22 1255     Subjective pt pleasant, "I am alitte dizzy today"    Currently in Pain? No/denies                   ADULT SLP TREATMENT - 03/14/22 0001       Cognitive-Linquistic Treatment   Treatment focused  on Voice;Patient/family/caregiver education    Skilled Treatment Skilled treatment session focused on pt's goals related to dysphonia. SLP facilitated session by providing moderate verbal cues for short bursts of air and slow rate when performing EMST exercises. Resistance increased from 45cmH2O to 60cmH2O. Written information on voice building and abdominal breathing was also provided. SLP further facilitated session by providing moderate visual, tactile and verbal cues to use abdominal breathing.              SLP Education - 03/14/22 1305     Education Details EMST, abdominal breathing, vocie building    Person(s) Educated Patient    Methods Explanation;Demonstration;Verbal cues;Handout    Comprehension Verbalized understanding;Need further instruction;Returned demonstration              SLP Short Term Goals - 02/15/22 1643       SLP SHORT TERM GOAL #1   Title Pt will demonstrate understanding of aspiration and dysphagia.    Time 10    Period --   sessions   Status New      SLP SHORT TERM GOAL #2   Title Pt will demonstrate understanding of ENT report.  Time 10    Period --   sessions   Status New              SLP Long Term Goals - 02/15/22 1644       SLP LONG TERM GOAL #1   Title Pt will improve vocal hygiene with Mod I assistance.    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  I recommend skilled ST to train pt in vocal hygiene, improve breath support for voice and reduce laryngeal tension in order to improve vocal quality, endurance, and quality of life.    Speech Therapy Frequency 2x / week    Duration 8 weeks    Treatment/Interventions SLP instruction and feedback;Patient/family education    Potential to Achieve Goals Fair    Potential Considerations  Co-morbidities;Previous level of function;Severity of impairments    SLP Home Exercise Plan provided, see pt instructions section    Consulted and Agree with Plan of Care Patient             Patient will benefit from skilled therapeutic intervention in order to improve the following deficits and impairments:   Dysphonia  Muscle tension dysphonia    Problem List Patient Active Problem List   Diagnosis Date Noted   Status post laparoscopic cholecystectomy 02/01/2022   Acute lower UTI 01/11/2022   Hypokalemia 01/11/2022   Dyslipidemia 01/11/2022   BPH (benign prostatic hyperplasia) 01/11/2022   GERD without esophagitis 01/11/2022   Loculated pleural effusion 01/11/2022   Atypical chest pain 01/11/2022   Abnormal barium swallow 12/07/2021   At risk for aspiration 12/07/2021   Change in voice 12/07/2021   Bilateral recurrent inguinal hernia without obstruction or gangrene 09/26/2021   Current use of long term anticoagulation 09/26/2021   S/P hip replacement 07/25/2021   Left inguinal hernia 07/25/2021   Dysphagia 06/22/2016   Low back pain 06/22/2016   Tachy-brady syndrome (HCC) 10/22/2015   IBS (irritable bowel syndrome) 09/14/2015   Acute bronchitis 07/29/2015   Chronic diastolic CHF (congestive heart failure) (HCC) 01/28/2015   Dermatitis fungal 12/14/2014   Nephrolithiasis 12/01/2014   CHF (congestive heart failure) (HCC)    Coronary artery disease    Diarrhea 07/30/2014   Encounter for therapeutic drug monitoring 07/14/2014   Numbness and tingling in left hand 05/11/2014   Orthostatic hypotension 04/28/2014   Leg swelling 12/08/2013   Disequilibrium 06/02/2013   Coronary atherosclerosis of native coronary artery 03/12/2013   SOB (shortness of breath) 03/12/2013   A-fib (HCC) 03/12/2013   Hyperlipidemia 03/12/2013   Cardiac pacemaker in situ 03/12/2013   Essential hypertension 03/12/2013   Depression 01/20/2013   Stasis dermatitis of both legs 01/20/2013    Myopia 09/05/2012   Presbyopia 09/05/2012   Tear film insufficiency 09/05/2012   Vitreous opacity 09/05/2012   Primary localized osteoarthrosis, lower leg 11/29/2010   History of nonmelanoma skin cancer 10/25/2010   Myalgia and myositis 12/04/2005    B. Dreama Saa, M.S., CCC-SLP, CBIS Speech-Language Pathologist Certified Brain Injury Specialist [facility]  [facility] Office [phone] Ascom [phone] Fax [phone]  Reuel Derby, CCC-SLP 03/14/2022, 1:07 PM  [facility] MAIN [facility] SERVICES [address] [place], [place],  [address] Phone: [phone]   Fax:  [phone]   Name: MERRILL VILLARRUEL MRN: [id number] Date of Birth: November 14, 1935

## 2022-03-14 NOTE — Patient Instructions (Signed)
EMST - continue 3 set of 10 - 3 times per day Add abdominal breathing 3 x 10 3 times per day

## 2022-03-16 ENCOUNTER — Ambulatory Visit: Payer: Medicare Other | Admitting: Speech Pathology

## 2022-03-16 DIAGNOSIS — R490 Dysphonia: Secondary | ICD-10-CM

## 2022-03-16 DIAGNOSIS — R1314 Dysphagia, pharyngoesophageal phase: Secondary | ICD-10-CM

## 2022-03-16 DIAGNOSIS — R1312 Dysphagia, oropharyngeal phase: Secondary | ICD-10-CM

## 2022-03-17 ENCOUNTER — Encounter: Payer: Self-pay | Admitting: Speech Pathology

## 2022-03-19 ENCOUNTER — Ambulatory Visit: Payer: Medicare Other | Admitting: Speech Pathology

## 2022-03-21 ENCOUNTER — Ambulatory Visit: Payer: Medicare Other | Admitting: Speech Pathology

## 2022-03-21 DIAGNOSIS — R490 Dysphonia: Secondary | ICD-10-CM | POA: Diagnosis not present

## 2022-03-22 NOTE — Therapy (Signed)
OUTPATIENT SPEECH LANGUAGE PATHOLOGY TREATMENT NOTE   Patient Name: Kyle Morales MRN: [id number] DOB:06-08-1936, 86 y.o., male Today's Date: 03/22/2022  PCP: Adaline Sill, MD REFERRING PROVIDER: Adaline Sill, MD   End of Session - 03/22/22 1318     Visit Number 7    Number of Visits 17    Date for SLP Re-Evaluation 04/12/22    Authorization Type Medicare    Authorization Time Period 02/13/2022 thru 04/12/2022    Authorization - Visit Number 7    Progress Note Due on Visit 10    SLP Start Time 1145    SLP Stop Time  1230    SLP Time Calculation (min) 45 min    Activity Tolerance Patient tolerated treatment well             Past Medical History:  Diagnosis Date   CHF (congestive heart failure) (HCC)    Coronary artery disease    Eczema    Hyperlipidemia    Hypertension    Osteoarthrosis    Paroxysmal A-fib (HCC)    Presence of permanent cardiac pacemaker    S/P cardiac pacemaker procedure    Past Surgical History:  Procedure Laterality Date   CARDIAC CATHETERIZATION  1997   CARDIAC CATHETERIZATION  2000   COLONOSCOPY  2012   CORONARY ANGIOPLASTY  1997   ramus   ESOPHAGOGASTRODUODENOSCOPY N/A 02/11/2017   Procedure: ESOPHAGOGASTRODUODENOSCOPY (EGD);  Surgeon: Christena Deem, MD;  Location: [facility] ENDOSCOPY;  Service: Endoscopy;  Laterality: N/A;   HERNIA REPAIR     INSERT / REPLACE / REMOVE PACEMAKER  12/2005   Medtronic [id number] - placed in [place]   MANDIBLE SURGERY     TONSILLECTOMY     TOTAL SHOULDER REPLACEMENT     bilateral    Patient Active Problem List   Diagnosis Date Noted   Status post laparoscopic cholecystectomy 02/01/2022   Acute lower UTI 01/11/2022   Hypokalemia 01/11/2022   Dyslipidemia 01/11/2022   BPH (benign prostatic hyperplasia) 01/11/2022   GERD without esophagitis 01/11/2022   Loculated pleural effusion 01/11/2022   Atypical chest pain 01/11/2022   Abnormal barium swallow 12/07/2021   At risk for aspiration 12/07/2021    Change in voice 12/07/2021   Bilateral recurrent inguinal hernia without obstruction or gangrene 09/26/2021   Current use of long term anticoagulation 09/26/2021   S/P hip replacement 07/25/2021   Left inguinal hernia 07/25/2021   Dysphagia 06/22/2016   Low back pain 06/22/2016   Tachy-brady syndrome (HCC) 10/22/2015   IBS (irritable bowel syndrome) 09/14/2015   Acute bronchitis 07/29/2015   Chronic diastolic CHF (congestive heart failure) (HCC) 01/28/2015   Dermatitis fungal 12/14/2014   Nephrolithiasis 12/01/2014   CHF (congestive heart failure) (HCC)    Coronary artery disease    Diarrhea 07/30/2014   Encounter for therapeutic drug monitoring 07/14/2014   Numbness and tingling in left hand 05/11/2014   Orthostatic hypotension 04/28/2014   Leg swelling 12/08/2013   Disequilibrium 06/02/2013   Coronary atherosclerosis of native coronary artery 03/12/2013   SOB (shortness of breath) 03/12/2013   A-fib (HCC) 03/12/2013   Hyperlipidemia 03/12/2013   Cardiac pacemaker in situ 03/12/2013   Essential hypertension 03/12/2013   Depression 01/20/2013   Stasis dermatitis of both legs 01/20/2013   Myopia 09/05/2012   Presbyopia 09/05/2012   Tear film insufficiency 09/05/2012   Vitreous opacity 09/05/2012   Primary localized osteoarthrosis, lower leg 11/29/2010   History of nonmelanoma skin cancer 10/25/2010   Myalgia and myositis 12/04/2005  ONSET DATE: 02/07/2022  REFERRING DIAG:  R49.0 (ICD-10-CM) - Dysphonia  R49.8 (ICD-10-CM) - Weakness of voice    THERAPY DIAG:  Dysphonia  Muscle tension dysphonia  Rationale for Evaluation and Treatment Rehabilitation  SUBJECTIVE: Pt pleasant, reports doing EMST, brought in info from recent ENT visit and MBSS  PAIN:  Are you having pain? No     OBJECTIVE:   TODAY'S TREATMENT: Skilled treatment session focused on pt's dysphonia goals, specifically respiratory muscle strength training, abdominal breathing and increased  vocal intensity. Pt was Mod I with EMST reporting a 3 out of 10 effort level at previous setting with resistance increased to 60 cmH2O to achieve 7-8 out of 10 effort level.  SLP further facilitated session by providing max multimodal cues including audio-visual feedback to increase vocal intensity to 72dB during drill work. Pt states that if he "yells people get mad" at him. However he is describing a situation when he was frustrated with his wife.   PATIENT EDUCATION: Education details: voice therapy vs instruction provided by the [employer] while a Therapist, music Person educated: Patient Education method: Explanation, Demonstration, Tactile cues, Verbal cues, and Handouts Education comprehension: needs further education   SLP Short Term Goals -       SLP SHORT TERM GOAL #1   Title Pt will demonstrate understanding of aspiration and dysphagia.    Time 10    Period --   sessions   Status New      SLP SHORT TERM GOAL #2   Title Pt will demonstrate understanding of ENT report.    Time 10    Period --   sessions   Status New              SLP Long Term Goals -       SLP LONG TERM GOAL #1   Title Pt will improve vocal hygiene with Mod I assistance.    Time 8    Period Weeks    Status New              Plan -     Clinical Impression Statement Pt continues to struggle with concepts introduced in voice therapy. Prognosis for improvement is guarded. As such, skilled ST intervention continues to be indicated to increase pt's QOL.    Speech Therapy Frequency 2x / week    Duration 8 weeks    Treatment/Interventions SLP instruction and feedback;Patient/family education    Potential to Achieve Goals Fair    Potential Considerations Co-morbidities;Previous level of function;Severity of impairments    Consulted and Agree with Plan of Care Patient               B. Dreama Saa, M.S., CCC-SLP, Tree surgeon Certified Brain Injury Specialist [facility]   [facility] Office [phone] Ascom [phone] Fax [phone]

## 2022-03-23 ENCOUNTER — Ambulatory Visit: Payer: Medicare Other | Admitting: Speech Pathology

## 2022-03-23 DIAGNOSIS — R490 Dysphonia: Secondary | ICD-10-CM | POA: Diagnosis not present

## 2022-03-23 NOTE — Therapy (Signed)
OUTPATIENT SPEECH LANGUAGE PATHOLOGY TREATMENT NOTE   Patient Name: Kyle Morales MRN: [id number] DOB:1936/05/17, 86 y.o., male Today's Date: 03/23/2022  PCP: Adaline Sill, MD REFERRING PROVIDER: Adaline Sill, MD   End of Session - 03/23/22 0910     Visit Number 8    Number of Visits 17    Date for SLP Re-Evaluation 04/12/22    Authorization Type Medicare    Authorization Time Period 02/13/2022 thru 04/12/2022    Authorization - Visit Number 8    Progress Note Due on Visit 10    SLP Start Time 0905    SLP Stop Time  1000    SLP Time Calculation (min) 55 min    Activity Tolerance Patient tolerated treatment well             Past Medical History:  Diagnosis Date   CHF (congestive heart failure) (HCC)    Coronary artery disease    Eczema    Hyperlipidemia    Hypertension    Osteoarthrosis    Paroxysmal A-fib (HCC)    Presence of permanent cardiac pacemaker    S/P cardiac pacemaker procedure    Past Surgical History:  Procedure Laterality Date   CARDIAC CATHETERIZATION  1997   CARDIAC CATHETERIZATION  2000   COLONOSCOPY  2012   CORONARY ANGIOPLASTY  1997   ramus   ESOPHAGOGASTRODUODENOSCOPY N/A 02/11/2017   Procedure: ESOPHAGOGASTRODUODENOSCOPY (EGD);  Surgeon: Christena Deem, MD;  Location: [facility] ENDOSCOPY;  Service: Endoscopy;  Laterality: N/A;   HERNIA REPAIR     INSERT / REPLACE / REMOVE PACEMAKER  12/2005   Medtronic [id number] - placed in [place]   MANDIBLE SURGERY     TONSILLECTOMY     TOTAL SHOULDER REPLACEMENT     bilateral    Patient Active Problem List   Diagnosis Date Noted   Status post laparoscopic cholecystectomy 02/01/2022   Acute lower UTI 01/11/2022   Hypokalemia 01/11/2022   Dyslipidemia 01/11/2022   BPH (benign prostatic hyperplasia) 01/11/2022   GERD without esophagitis 01/11/2022   Loculated pleural effusion 01/11/2022   Atypical chest pain 01/11/2022   Abnormal barium swallow 12/07/2021   At risk for aspiration 12/07/2021    Change in voice 12/07/2021   Bilateral recurrent inguinal hernia without obstruction or gangrene 09/26/2021   Current use of long term anticoagulation 09/26/2021   S/P hip replacement 07/25/2021   Left inguinal hernia 07/25/2021   Dysphagia 06/22/2016   Low back pain 06/22/2016   Tachy-brady syndrome (HCC) 10/22/2015   IBS (irritable bowel syndrome) 09/14/2015   Acute bronchitis 07/29/2015   Chronic diastolic CHF (congestive heart failure) (HCC) 01/28/2015   Dermatitis fungal 12/14/2014   Nephrolithiasis 12/01/2014   CHF (congestive heart failure) (HCC)    Coronary artery disease    Diarrhea 07/30/2014   Encounter for therapeutic drug monitoring 07/14/2014   Numbness and tingling in left hand 05/11/2014   Orthostatic hypotension 04/28/2014   Leg swelling 12/08/2013   Disequilibrium 06/02/2013   Coronary atherosclerosis of native coronary artery 03/12/2013   SOB (shortness of breath) 03/12/2013   A-fib (HCC) 03/12/2013   Hyperlipidemia 03/12/2013   Cardiac pacemaker in situ 03/12/2013   Essential hypertension 03/12/2013   Depression 01/20/2013   Stasis dermatitis of both legs 01/20/2013   Myopia 09/05/2012   Presbyopia 09/05/2012   Tear film insufficiency 09/05/2012   Vitreous opacity 09/05/2012   Primary localized osteoarthrosis, lower leg 11/29/2010   History of nonmelanoma skin cancer 10/25/2010   Myalgia and myositis 12/04/2005  ONSET DATE: 02/07/2022  REFERRING DIAG:  R49.0 (ICD-10-CM) - Dysphonia  R49.8 (ICD-10-CM) - Weakness of voice    THERAPY DIAG:  Dysphonia  Muscle tension dysphonia  Rationale for Evaluation and Treatment Rehabilitation  SUBJECTIVE: Pt's wife attended session with pt. She is helpful in providing information from previous appts.   PAIN:  Are you having pain? No     OBJECTIVE:   TODAY'S TREATMENT: Skilled treatment session focused on pt's speech intelligibility goals. SLP facilitated session by providing extensive  information on hypo/hypernasality with diagrams shown to pt of nasal cavity as well as demonstration of each example of resonance. 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 With moderate faded to minimal pt able to increase vocal intensity by applying a 6 out of 10 effort level to achieve 83 dB with sustained "ah" and 75 dB when reading list of functional phases. Audio-visual feedback provided as pt continues to perceive that he is yelling. His wife also provided support that pt is using an appropriately loud voice. SLP further compared his vocal intensity to her own and each were the same. Despite maximal multimodal cues, pt continues to struggle with concepts introduced within session.   PATIENT EDUCATION: Education details: voice therapy vs instruction provided by the [employer] while a Therapist, music Person educated: Patient Education method: Explanation, Demonstration, Tactile cues, Verbal cues, and Handouts Education comprehension: needs further education   SLP Short Term Goals -       SLP SHORT TERM GOAL #1   Title Pt will demonstrate understanding of aspiration and dysphagia.    Time 10    Period --   sessions   Status New      SLP SHORT TERM GOAL #2   Title Pt will demonstrate understanding of ENT report.    Time 10    Period --   sessions   Status New              SLP Long Term Goals -       SLP LONG TERM GOAL #1   Title Pt will improve vocal hygiene with Mod I assistance.    Time 8    Period Weeks    Status New              Plan -     Clinical Impression Statement Pt continues to struggle with concepts introduced in voice therapy. Prognosis for improvement is guarded. As such, skilled ST intervention continues to be indicated to increase pt's QOL.    Speech Therapy Frequency 2x / week    Duration 8 weeks    Treatment/Interventions SLP instruction and  feedback;Patient/family education    Potential to Achieve Goals Fair    Potential Considerations Co-morbidities;Previous level of function;Severity of impairments    Consulted and Agree with Plan of Care Patient               B. Dreama Saa, M.S., CCC-SLP, Tree surgeon Certified Brain Injury Specialist [facility]  [facility] Rehabilitation Services Office [phone] Ascom [phone] Fax [phone]

## 2022-03-26 ENCOUNTER — Ambulatory Visit: Payer: Medicare Other | Admitting: Speech Pathology

## 2022-03-29 ENCOUNTER — Ambulatory Visit: Payer: Medicare Other | Attending: Student | Admitting: Speech Pathology

## 2022-03-29 DIAGNOSIS — J383 Other diseases of vocal cords: Secondary | ICD-10-CM | POA: Diagnosis present

## 2022-03-29 DIAGNOSIS — R490 Dysphonia: Secondary | ICD-10-CM | POA: Diagnosis not present

## 2022-03-29 NOTE — Therapy (Signed)
OUTPATIENT SPEECH LANGUAGE PATHOLOGY TREATMENT NOTE   Patient Name: Kyle Morales MRN: [id number] DOB:October 24, 1935, 86 y.o., male Today's Date: 03/29/2022  PCP: Adaline Sill, MD REFERRING PROVIDER: Adaline Sill, MD   End of Session - 03/29/22 0906     Visit Number 9    Number of Visits 17    Date for SLP Re-Evaluation 04/12/22    Authorization Type Medicare    Authorization Time Period 02/13/2022 thru 04/12/2022    Authorization - Visit Number 9    Progress Note Due on Visit 10    SLP Start Time 0900    SLP Stop Time  0945    SLP Time Calculation (min) 45 min    Activity Tolerance Patient tolerated treatment well             Past Medical History:  Diagnosis Date   CHF (congestive heart failure) (HCC)    Coronary artery disease    Eczema    Hyperlipidemia    Hypertension    Osteoarthrosis    Paroxysmal A-fib (HCC)    Presence of permanent cardiac pacemaker    S/P cardiac pacemaker procedure    Past Surgical History:  Procedure Laterality Date   CARDIAC CATHETERIZATION  1997   CARDIAC CATHETERIZATION  2000   COLONOSCOPY  2012   CORONARY ANGIOPLASTY  1997   ramus   ESOPHAGOGASTRODUODENOSCOPY N/A 02/11/2017   Procedure: ESOPHAGOGASTRODUODENOSCOPY (EGD);  Surgeon: Christena Deem, MD;  Location: [facility] ENDOSCOPY;  Service: Endoscopy;  Laterality: N/A;   HERNIA REPAIR     INSERT / REPLACE / REMOVE PACEMAKER  12/2005   [employer]tronic [id number] H - placed in [place]   MANDIBLE SURGERY     TONSILLECTOMY     TOTAL SHOULDER REPLACEMENT     bilateral    Patient Active Problem List   Diagnosis Date Noted   Status post laparoscopic cholecystectomy 02/01/2022   Acute lower UTI 01/11/2022   Hypokalemia 01/11/2022   Dyslipidemia 01/11/2022   BPH (benign prostatic hyperplasia) 01/11/2022   GERD without esophagitis 01/11/2022   Loculated pleural effusion 01/11/2022   Atypical chest pain 01/11/2022   Abnormal barium swallow 12/07/2021   At risk for aspiration 12/07/2021    Change in voice 12/07/2021   Bilateral recurrent inguinal hernia without obstruction or gangrene 09/26/2021   Current use of long term anticoagulation 09/26/2021   S/P hip replacement 07/25/2021   Left inguinal hernia 07/25/2021   Dysphagia 06/22/2016   Low back pain 06/22/2016   Tachy-brady syndrome (HCC) 10/22/2015   IBS (irritable bowel syndrome) 09/14/2015   Acute bronchitis 07/29/2015   Chronic diastolic CHF (congestive heart failure) (HCC) 01/28/2015   Dermatitis fungal 12/14/2014   Nephrolithiasis 12/01/2014   CHF (congestive heart failure) (HCC)    Coronary artery disease    Diarrhea 07/30/2014   Encounter for therapeutic drug monitoring 07/14/2014   Numbness and tingling in left hand 05/11/2014   Orthostatic hypotension 04/28/2014   Leg swelling 12/08/2013   Disequilibrium 06/02/2013   Coronary atherosclerosis of native coronary artery 03/12/2013   SOB (shortness of breath) 03/12/2013   A-fib (HCC) 03/12/2013   Hyperlipidemia 03/12/2013   Cardiac pacemaker in situ 03/12/2013   Essential hypertension 03/12/2013   Depression 01/20/2013   Stasis dermatitis of both legs 01/20/2013   Myopia 09/05/2012   Presbyopia 09/05/2012   Tear film insufficiency 09/05/2012   Vitreous opacity 09/05/2012   Primary localized osteoarthrosis, lower leg 11/29/2010   History of nonmelanoma skin cancer 10/25/2010   Myalgia and myositis 12/04/2005  ONSET DATE: 02/07/2022  REFERRING DIAG:  R49.0 (ICD-10-CM) - Dysphonia  R49.8 (ICD-10-CM) - Weakness of voice    THERAPY DIAG:  Dysphonia  Muscle tension dysphonia  Rationale for Evaluation and Treatment Rehabilitation  SUBJECTIVE: Pt reports sporadic practice - "I am not use to sitting around my house by myself and reading sentences" Vocal strain noted in voice with initial conversation  PAIN:  Are you having pain? No     OBJECTIVE: use a loud, good quality voice to improve speech intelligibility  TODAY'S TREATMENT: Skilled  treatment session focused on pt's voice goals. SLP facilitated session by providing extensive information with diagrams on function of vocal cords, vocal fold atrophy, abdominal breathing. Pt attributes his dysphonia to mucus and not to muscle tension dysphonia and vocal cord atrophy. He continues to struggle with these concepts and thus he states that he is unsure that he wants to put in "this much effort" to change his voice.   Pt with Mod I when reading functional sentences using a good quality loud voice (~80 dB). During off the cuff remarks, pt with intermittent vocal strain and aphonia but improved with moderate cues to use loud, good quality voice. Audio visual feedback helpful in contrasting his voice when using a loud, good quality voice vs his habitual voice.    PATIENT EDUCATION: Education details:  Person educated: Patient Education method: Programmer, multimedia, Facilities manager, Actor cues, Verbal cues, and Handouts Education comprehension: needs further education   SLP Short Term Goals -       SLP SHORT TERM GOAL #1   Title Pt will demonstrate understanding of aspiration and dysphagia.    Time 10    Period --   sessions   Status New      SLP SHORT TERM GOAL #2   Title Pt will demonstrate understanding of ENT report.    Time 10    Period --   sessions   Status New              SLP Long Term Goals -       SLP LONG TERM GOAL #1   Title Pt will improve vocal hygiene with Mod I assistance.    Time 8    Period Weeks    Status New              Plan -     Clinical Impression Statement Pt continues to struggle with concepts introduced in voice therapy thus he is uncertain that he wants to continue. Pt does demonstrate some difficulty recalling information from appt with ENT which makes it more difficulty to fully adhere to therapy concepts. Prognosis for improvement is guarded. As such, skilled ST intervention continues to be indicated to increase pt's QOL.    Speech  Therapy Frequency 2x / week    Duration 8 weeks    Treatment/Interventions SLP instruction and feedback;Patient/family education    Potential to Achieve Goals Fair    Potential Considerations Co-morbidities;Previous level of function;Severity of impairments    Consulted and Agree with Plan of Care Patient               B. Dreama Saa, M.S., CCC-SLP, Tree surgeon Certified Brain Injury Specialist [facility]  [facility] Rehabilitation Services Office [phone] Ascom [phone] Fax [phone]

## 2022-04-03 ENCOUNTER — Ambulatory Visit: Payer: Medicare Other | Admitting: Speech Pathology

## 2022-04-05 ENCOUNTER — Ambulatory Visit: Payer: Medicare Other | Admitting: Speech Pathology

## 2022-04-05 DIAGNOSIS — R490 Dysphonia: Secondary | ICD-10-CM | POA: Diagnosis not present

## 2022-04-05 DIAGNOSIS — J383 Other diseases of vocal cords: Secondary | ICD-10-CM

## 2022-04-05 NOTE — Therapy (Signed)
OUTPATIENT SPEECH LANGUAGE PATHOLOGY TREATMENT NOTE 10TH VISIT PROGRESS NOTE   Patient Name: Kyle Morales MRN: [id number] DOB:November 01, 1935, 86 y.o., male Today's Date: 04/05/2022  PCP: Adaline Sill, MD REFERRING PROVIDER: Adaline Sill, MD   End of Session - 04/05/22 0908     Visit Number 10    Number of Visits 17    Date for SLP Re-Evaluation 04/12/22    Authorization Type Medicare    Authorization Time Period 02/13/2022 thru 04/12/2022    Authorization - Visit Number 10    Progress Note Due on Visit 10    SLP Start Time 0910    SLP Stop Time  0950    SLP Time Calculation (min) 40 min    Activity Tolerance Patient tolerated treatment well             Past Medical History:  Diagnosis Date   CHF (congestive heart failure) (HCC)    Coronary artery disease    Eczema    Hyperlipidemia    Hypertension    Osteoarthrosis    Paroxysmal A-fib (HCC)    Presence of permanent cardiac pacemaker    S/P cardiac pacemaker procedure    Past Surgical History:  Procedure Laterality Date   CARDIAC CATHETERIZATION  1997   CARDIAC CATHETERIZATION  2000   COLONOSCOPY  2012   CORONARY ANGIOPLASTY  1997   ramus   ESOPHAGOGASTRODUODENOSCOPY N/A 02/11/2017   Procedure: ESOPHAGOGASTRODUODENOSCOPY (EGD);  Surgeon: Christena Deem, MD;  Location: [facility] ENDOSCOPY;  Service: Endoscopy;  Laterality: N/A;   HERNIA REPAIR     INSERT / REPLACE / REMOVE PACEMAKER  12/2005   Medtronic [id number] H - placed in [place]   MANDIBLE SURGERY     TONSILLECTOMY     TOTAL SHOULDER REPLACEMENT     bilateral    Patient Active Problem List   Diagnosis Date Noted   Status post laparoscopic cholecystectomy 02/01/2022   Acute lower UTI 01/11/2022   Hypokalemia 01/11/2022   Dyslipidemia 01/11/2022   BPH (benign prostatic hyperplasia) 01/11/2022   GERD without esophagitis 01/11/2022   Loculated pleural effusion 01/11/2022   Atypical chest pain 01/11/2022   Abnormal barium swallow 12/07/2021   At risk  for aspiration 12/07/2021   Change in voice 12/07/2021   Bilateral recurrent inguinal hernia without obstruction or gangrene 09/26/2021   Current use of long term anticoagulation 09/26/2021   S/P hip replacement 07/25/2021   Left inguinal hernia 07/25/2021   Dysphagia 06/22/2016   Low back pain 06/22/2016   Tachy-brady syndrome (HCC) 10/22/2015   IBS (irritable bowel syndrome) 09/14/2015   Acute bronchitis 07/29/2015   Chronic diastolic CHF (congestive heart failure) (HCC) 01/28/2015   Dermatitis fungal 12/14/2014   Nephrolithiasis 12/01/2014   CHF (congestive heart failure) (HCC)    Coronary artery disease    Diarrhea 07/30/2014   Encounter for therapeutic drug monitoring 07/14/2014   Numbness and tingling in left hand 05/11/2014   Orthostatic hypotension 04/28/2014   Leg swelling 12/08/2013   Disequilibrium 06/02/2013   Coronary atherosclerosis of native coronary artery 03/12/2013   SOB (shortness of breath) 03/12/2013   A-fib (HCC) 03/12/2013   Hyperlipidemia 03/12/2013   Cardiac pacemaker in situ 03/12/2013   Essential hypertension 03/12/2013   Depression 01/20/2013   Stasis dermatitis of both legs 01/20/2013   Myopia 09/05/2012   Presbyopia 09/05/2012   Tear film insufficiency 09/05/2012   Vitreous opacity 09/05/2012   Primary localized osteoarthrosis, lower leg 11/29/2010   History of nonmelanoma skin cancer 10/25/2010   Myalgia and  myositis 12/04/2005    ONSET DATE: 02/07/2022  REFERRING DIAG:  R49.0 (ICD-10-CM) - Dysphonia  R49.8 (ICD-10-CM) - Weakness of voice    THERAPY DIAG:  Dysphonia  Muscle tension dysphonia  Vocal cord atrophy  Rationale for Evaluation and Treatment Rehabilitation  SUBJECTIVE: "Can my vocal cords be fixed?"  PAIN:  Are you having pain? No     OBJECTIVE: use a loud, good quality voice to improve speech intelligibility  TODAY'S TREATMENT: Skilled treatment session focused on pt's voice goals. Unfortunately, SLP continues  to facilitate session by reviewing previous topics, thus little therapeutic activity is completed. With maximal assistance, pt able to produce a loud, good quality voice at the phrase level.   PATIENT EDUCATION: Education details:  Person educated: Patient Education method: Programmer, multimedia, Facilities manager, Actor cues, Verbal cues, and Handouts Education comprehension: needs further education   SLP Short Term Goals -       SLP SHORT TERM GOAL #1   Title Pt will demonstrate understanding of aspiration and dysphagia.    Time 10    Period --   sessions   Status ongoing     SLP SHORT TERM GOAL #2   Title Pt will demonstrate understanding of ENT report.    Time 10    Period --   sessions   Status ongoing             SLP Long Term Goals -       SLP LONG TERM GOAL #1   Title Pt will improve vocal hygiene with Mod I assistance.    Time 8    Period Weeks    Status ongoing             Plan -     Clinical Impression Statement Prognosis for improvement is guarded. Requested that pt's wife attend future ST session to promote increased carryover to concepts taught.    Speech Therapy Frequency 2x / week    Duration 8 weeks    Treatment/Interventions SLP instruction and feedback;Patient/family education    Potential to Achieve Goals Fair    Potential Considerations Co-morbidities;Previous level of function;Severity of impairments    Consulted and Agree with Plan of Care Patient               B. Dreama Saa, M.S., CCC-SLP, Tree surgeon Certified Brain Injury Specialist [facility]  [facility] Rehabilitation Services Office [phone] Ascom [phone] Fax [phone]

## 2022-04-10 ENCOUNTER — Ambulatory Visit: Payer: Medicare Other | Admitting: Speech Pathology

## 2022-04-10 DIAGNOSIS — J383 Other diseases of vocal cords: Secondary | ICD-10-CM

## 2022-04-10 DIAGNOSIS — R490 Dysphonia: Secondary | ICD-10-CM

## 2022-04-10 NOTE — Therapy (Signed)
OUTPATIENT SPEECH LANGUAGE PATHOLOGY TREATMENT NOTE DISCHARGE SUMMARY   Patient Name: Kyle Morales MRN: [id number] DOB:05-May-1936, 86 y.o., male Today's Date: 04/10/2022  PCP: Billie Lade, MD REFERRING PROVIDER: Billie Lade, MD   End of Session - 04/10/22 0900     Visit Number 11    Number of Visits 17    Date for SLP Re-Evaluation 04/12/22    Authorization Type Medicare    Authorization Time Period 02/13/2022 thru 04/12/2022    Authorization - Visit Number 11    Progress Note Due on Visit 20    SLP Start Time 0900    SLP Stop Time  0945    SLP Time Calculation (min) 45 min    Activity Tolerance Patient tolerated treatment well             Past Medical History:  Diagnosis Date   CHF (congestive heart failure) ([place])    Coronary artery disease    Eczema    Hyperlipidemia    Hypertension    Osteoarthrosis    Paroxysmal A-fib ([place])    Presence of permanent cardiac pacemaker    S/P cardiac pacemaker procedure    Past Surgical History:  Procedure Laterality Date   [place]   COLONOSCOPY  2012   CORONARY ANGIOPLASTY  1997   ramus   ESOPHAGOGASTRODUODENOSCOPY N/A 02/11/2017   Procedure: ESOPHAGOGASTRODUODENOSCOPY (EGD);  Surgeon: Lollie Sails, MD;  Location: [facility] ENDOSCOPY;  Service: Endoscopy;  Laterality: N/A;   HERNIA REPAIR     INSERT / REPLACE / REMOVE PACEMAKER  12/2005   Medtronic [id number] H - placed in [place]     bilateral    Patient Active Problem List   Diagnosis Date Noted   Status post laparoscopic cholecystectomy 02/01/2022   Acute lower UTI 01/11/2022   Hypokalemia 01/11/2022   Dyslipidemia 01/11/2022   BPH (benign prostatic hyperplasia) 01/11/2022   GERD without esophagitis 01/11/2022   Loculated pleural effusion 01/11/2022   Atypical chest pain 01/11/2022   Abnormal barium swallow 12/07/2021   At risk for  aspiration 12/07/2021   Change in voice 12/07/2021   Bilateral recurrent inguinal hernia without obstruction or gangrene 09/26/2021   Current use of long term anticoagulation 09/26/2021   S/P hip replacement 07/25/2021   Left inguinal hernia 07/25/2021   Dysphagia 06/22/2016   Low back pain 06/22/2016   Tachy-brady syndrome ([place]) 10/22/2015   IBS (irritable bowel syndrome) 09/14/2015   Acute bronchitis 07/29/2015   Chronic diastolic CHF (congestive heart failure) ([place]) 01/28/2015   Dermatitis fungal 12/14/2014   Nephrolithiasis 12/01/2014   CHF (congestive heart failure) ([place])    Coronary artery disease    Diarrhea 07/30/2014   Encounter for therapeutic drug monitoring 07/14/2014   Numbness and tingling in left hand 05/11/2014   Orthostatic hypotension 04/28/2014   Leg swelling 12/08/2013   Disequilibrium 06/02/2013   Coronary atherosclerosis of native coronary artery 03/12/2013   SOB (shortness of breath) 03/12/2013   A-fib ([place]) 03/12/2013   Hyperlipidemia 03/12/2013   Cardiac pacemaker in situ 03/12/2013   Essential hypertension 03/12/2013   Depression 01/20/2013   Stasis dermatitis of both legs 01/20/2013   Myopia 09/05/2012   Presbyopia 09/05/2012   Tear film insufficiency 09/05/2012   Vitreous opacity 09/05/2012   Primary localized osteoarthrosis, lower leg 11/29/2010   History of nonmelanoma skin cancer 10/25/2010   Myalgia and myositis 12/04/2005  ONSET DATE: 02/07/2022  REFERRING DIAG:  R49.0 (ICD-10-CM) - Dysphonia  R49.8 (ICD-10-CM) - Weakness of voice    THERAPY DIAG:  Dysphonia  Muscle tension dysphonia  Vocal cord atrophy  Rationale for Evaluation and Treatment Rehabilitation  SUBJECTIVE: "I am doing really good, exactly" pt's wife present per SLP request to assist with carryover of concepts taught in   PAIN:  Are you having pain? No     OBJECTIVE: use a loud, good quality voice to improve speech intelligibility  TODAY'S  TREATMENT: Pt's wife attended per SLP request as pt has struggled with concepts introduced within session as well carryover of HEP. 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 His wife does report that she is able to hear him better in the house as he is intermittently talking louder but his voice fades at the end of phrases and sentences. SLP facilitated session by providing moderate verbal cues to produce quick burst of air when completing 3 sets of 10 EMST exercises. EMST increased to 75cmH2O to achieve 7 out of 10 effort level.   SLP attempted to engage pt in therapeutic activities but pt continues with "but" or "I disagree with that." Pt is unable to recall information from ENT visit but continues to assert "I think if we cure the mucus, my voice will be better, the mucus is interfering with the function of my vocal cords."  PATIENT EDUCATION: Education details:  Person educated: Patient Education method: Explanation, Demonstration, Tactile cues, Verbal cues, and Handouts Education comprehension: needs further education   SLP Short Term Goals -       SLP SHORT TERM GOAL #1   Title Pt will demonstrate understanding of aspiration and dysphagia.    Time 10    Period --   sessions   Status Not met     SLP SHORT TERM GOAL #2   Title Pt will demonstrate understanding of ENT report.    Time 10    Period --   sessions   Status Not met d/t difficulty with recall             SLP Long Term Goals -       SLP LONG TERM GOAL #1   Title Pt will improve vocal hygiene with Mod I assistance.    Time 8    Period Weeks    Status Not met             Plan -     Clinical Impression Statement Over the course of skilled ST, pt has not made a significant amount of progress d/t continued fixation on his mucus and suspected age related cognitive deficits. As such, recommend resuming skilled ST intervention  after pt has received treatment for his nasal polyps. Pt and his wife are in agreement with this plan.                        Consulted and Agree with Plan of Care Patient and his wife              B. Rutherford Nail, M.S., CCC-SLP, Mining engineer Certified Brain Injury [place]  [place] Office [phone] Ascom [phone] Fax [phone]

## 2022-04-12 ENCOUNTER — Ambulatory Visit: Payer: Medicare Other | Admitting: Speech Pathology

## 2022-04-17 ENCOUNTER — Ambulatory Visit: Payer: Medicare Other | Admitting: Speech Pathology

## 2022-04-19 ENCOUNTER — Ambulatory Visit: Payer: Medicare Other | Admitting: Speech Pathology

## 2022-04-24 ENCOUNTER — Ambulatory Visit: Payer: Medicare Other | Admitting: Speech Pathology

## 2022-04-26 ENCOUNTER — Ambulatory Visit: Payer: Medicare Other | Admitting: Speech Pathology

## 2022-05-01 ENCOUNTER — Ambulatory Visit (INDEPENDENT_AMBULATORY_CARE_PROVIDER_SITE_OTHER): Payer: Medicare Other

## 2022-05-01 DIAGNOSIS — I495 Sick sinus syndrome: Secondary | ICD-10-CM | POA: Diagnosis not present

## 2022-05-01 LAB — CUP PACEART REMOTE DEVICE CHECK
Battery Remaining Longevity: 39 mo
Battery Voltage: 2.97 V
Brady Statistic AP VP Percent: 0.82 %
Brady Statistic AP VS Percent: 93.92 %
Brady Statistic AS VP Percent: 0.13 %
Brady Statistic AS VS Percent: 5.13 %
Brady Statistic RA Percent Paced: 93.21 %
Brady Statistic RV Percent Paced: 1.07 %
Date Time Interrogation Session: 20230808075712
Implantable Lead Implant Date: 20070411
Implantable Lead Implant Date: 20070411
Implantable Lead Location: 753859
Implantable Lead Location: 753860
Implantable Lead Model: 5076
Implantable Lead Model: 5076
Implantable Pulse Generator Implant Date: 20170224
Lead Channel Impedance Value: 323 Ohm
Lead Channel Impedance Value: 342 Ohm
Lead Channel Impedance Value: 399 Ohm
Lead Channel Impedance Value: 456 Ohm
Lead Channel Pacing Threshold Amplitude: 0.625 V
Lead Channel Pacing Threshold Amplitude: 1.125 V
Lead Channel Pacing Threshold Pulse Width: 0.4 ms
Lead Channel Pacing Threshold Pulse Width: 0.4 ms
Lead Channel Sensing Intrinsic Amplitude: 1.5 mV
Lead Channel Sensing Intrinsic Amplitude: 1.5 mV
Lead Channel Sensing Intrinsic Amplitude: 5.375 mV
Lead Channel Sensing Intrinsic Amplitude: 5.375 mV
Lead Channel Setting Pacing Amplitude: 2 V
Lead Channel Setting Pacing Amplitude: 2.5 V
Lead Channel Setting Pacing Pulse Width: 0.4 ms
Lead Channel Setting Sensing Sensitivity: 0.9 mV

## 2022-05-15 ENCOUNTER — Encounter: Payer: Self-pay | Admitting: Surgery

## 2022-05-15 ENCOUNTER — Other Ambulatory Visit: Payer: Self-pay

## 2022-05-15 ENCOUNTER — Ambulatory Visit (INDEPENDENT_AMBULATORY_CARE_PROVIDER_SITE_OTHER): Payer: Medicare Other | Admitting: Surgery

## 2022-05-15 ENCOUNTER — Telehealth: Payer: Self-pay

## 2022-05-15 ENCOUNTER — Telehealth: Payer: Self-pay | Admitting: Cardiovascular Disease

## 2022-05-15 ENCOUNTER — Ambulatory Visit: Payer: Self-pay | Admitting: Surgery

## 2022-05-15 VITALS — BP 115/80 | HR 87 | Temp 97.9°F | Ht 71.0 in | Wt 170.6 lb

## 2022-05-15 DIAGNOSIS — K4090 Unilateral inguinal hernia, without obstruction or gangrene, not specified as recurrent: Secondary | ICD-10-CM | POA: Diagnosis not present

## 2022-05-15 NOTE — Patient Instructions (Addendum)
Our surgery scheduler will call you within 24-48 hours to schedule your surgery. Please have the Blue surgery sheet available when speaking with her.   Cardiac Clearance faxed to Dr.Timothy Gollan.    Inguinal Hernia, Adult An inguinal hernia develops when fat or the intestines push through a weak spot in a muscle where the leg meets the lower abdomen (groin). This creates a bulge. This kind of hernia could also be: In the scrotum, if you are male. In folds of skin around the vagina, if you are male. There are three types of inguinal hernias: Hernias that can be pushed back into the abdomen (are reducible). This type rarely causes pain. Hernias that are not reducible (are incarcerated). Hernias that are not reducible and lose their blood supply (are strangulated). This type of hernia requires emergency surgery. What are the causes? This condition is caused by having a weak spot in the muscles or tissues in your groin. This develops over time. The hernia may poke through the weak spot when you suddenly strain your lower abdominal muscles, such as when you: Lift a heavy object. Strain to have a bowel movement. Constipation can lead to straining. Cough. What increases the risk? This condition is more likely to develop in: Males. Pregnant females. People who: Are overweight. Work in jobs that require long periods of standing or heavy lifting. Have had an inguinal hernia before. Smoke or have lung disease. These factors can lead to long-term (chronic) coughing. What are the signs or symptoms? Symptoms may depend on the size of the hernia. Often, a small inguinal hernia has no symptoms. Symptoms of a larger hernia may include: A bulge in the groin area. This is easier to see when standing. It might not be visible when lying down. Pain or burning in the groin. This may get worse when lifting, straining, or coughing. A dull ache or a feeling of pressure in the groin. An unusual bulge in the  scrotum, in males. Symptoms of a strangulated inguinal hernia may include: A bulge in your groin that is very painful and tender to the touch. A bulge that turns red or purple. Fever, nausea, and vomiting. Inability to have a bowel movement or to pass gas. How is this diagnosed? This condition is diagnosed based on your symptoms, your medical history, and a physical exam. Your health care provider may feel your groin area and ask you to cough. How is this treated? Treatment depends on the size of your hernia and whether you have symptoms. If you do not have symptoms, your health care provider may have you watch your hernia carefully and have you come in for follow-up visits. If your hernia is large or if you have symptoms, you may need surgery to repair the hernia. Follow these instructions at home: Lifestyle Avoid lifting heavy objects. Avoid standing for long periods of time. Do not use any products that contain nicotine or tobacco. These products include cigarettes, chewing tobacco, and vaping devices, such as e-cigarettes. If you need help quitting, ask your health care provider. Maintain a healthy weight. Preventing constipation You may need to take these actions to prevent or treat constipation: Drink enough fluid to keep your urine pale yellow. Take over-the-counter or prescription medicines. Eat foods that are high in fiber, such as beans, whole grains, and fresh fruits and vegetables. Limit foods that are high in fat and processed sugars, such as fried or sweet foods. General instructions You may try to push the hernia back in place  by very gently pressing on it while lying down. Do not try to force the bulge back in if it will not push in easily. Watch your hernia for any changes in shape, size, or color. Get help right away if you notice any changes. Take over-the-counter and prescription medicines only as told by your health care provider. Keep all follow-up visits. This is  important. Contact a health care provider if: You have a fever or chills. You develop new symptoms. Your symptoms get worse. Get help right away if: You have pain in your groin that suddenly gets worse. You have a bulge in your groin that: Suddenly gets bigger and does not get smaller. Becomes red or purple or painful to the touch. You are a man and you have a sudden pain in your scrotum, or the size of your scrotum suddenly changes. You cannot push the hernia back in place by very gently pressing on it when you are lying down. You have nausea or vomiting that does not go away. You have a fast heartbeat. You cannot have a bowel movement or pass gas. These symptoms may represent a serious problem that is an emergency. Do not wait to see if the symptoms will go away. Get medical help right away. Call your local emergency services (911 in the [place] Summary An inguinal hernia develops when fat or the intestines push through a weak spot in a muscle where your leg meets your lower abdomen (groin). This condition is caused by having a weak spot in muscles or tissues in your groin. Symptoms may depend on the size of the hernia, and they may include pain or swelling in your groin. A small inguinal hernia often has no symptoms. Treatment may not be needed if you do not have symptoms. If you have symptoms or a large hernia, you may need surgery to repair the hernia. Avoid lifting heavy objects. Also, avoid standing for long periods of time. This information is not intended to replace advice given to you by your health care provider. Make sure you discuss any questions you have with your health care provider. Document Revised: 05/10/2020 Document Reviewed: 05/10/2020 Elsevier Patient Education  [place].

## 2022-05-15 NOTE — Telephone Encounter (Signed)
Cardiac clearance faxed to Dr.Timothy Gollan.  

## 2022-05-15 NOTE — Telephone Encounter (Signed)
   Pre-operative Risk Assessment    Patient Name: Kyle Morales  DOB: [birth year] MRN: [id number]      Request for Surgical Clearance    Procedure:  Inguinal Hernia Repair  Date of Surgery:  Clearance TBD                                 Surgeon:  Campbell Lerner Surgeon's Group or Practice Name:  [facility] Phone number:  [phone] Fax number:  [phone]   Type of Clearance Requested:   Pharmacy, Eliquis    Type of Anesthesia:  Not Indicated   Additional requests/questions:    Signed, Pilar A Ham   05/15/2022, 3:10 PM

## 2022-05-15 NOTE — Telephone Encounter (Signed)
Patient with diagnosis of afib on Eliquis for anticoagulation.    Procedure: inguinal hernia repair Date of procedure: 06/08/22  CHA2DS2-VASc Score = 5  This indicates a 7.2% annual risk of stroke. The patient's score is based upon: CHF History: 1 HTN History: 1 Diabetes History: 0 Stroke History: 0 Vascular Disease History: 1 Age Score: 2 Gender Score: 0  CrCl 71mL/min Platelet count 137K  Per office protocol, patient can hold Eliquis for 2-3 days prior to procedure.    **This guidance is not considered finalized until pre-operative APP has relayed final recommendations.**

## 2022-05-15 NOTE — H&P (View-Only) (Signed)
Patient ID: Kyle Morales, male   DOB: 02-06-1936, 86 y.o.   MRN: [id number]  Chief Complaint: Left inguinal hernia  History of Present Illness Kyle Morales is a 86 y.o. male with left inguinal hernia, symptoms involve sporadic left groin pain, difficulty with voiding.  Increased discomfort with activities.  Concerns raised about exacerbation with or while traveling.  Worsened with cough or sneezing.  Past Medical History Past Medical History:  Diagnosis Date   CHF (congestive heart failure) (HCC)    Coronary artery disease    Eczema    Hyperlipidemia    Hypertension    Osteoarthrosis    Paroxysmal A-fib (HCC)    Presence of permanent cardiac pacemaker    S/P cardiac pacemaker procedure       Past Surgical History:  Procedure Laterality Date   CARDIAC CATHETERIZATION  09/25/1995   CARDIAC CATHETERIZATION  09/24/1998   COLONOSCOPY  09/24/2010   CORONARY ANGIOPLASTY  09/25/1995   ramus   ESOPHAGOGASTRODUODENOSCOPY N/A 02/11/2017   Procedure: ESOPHAGOGASTRODUODENOSCOPY (EGD);  Surgeon: Christena Deem, MD;  Location: [facility] ENDOSCOPY;  Service: Endoscopy;  Laterality: N/A;   HERNIA REPAIR     INSERT / REPLACE / REMOVE PACEMAKER  12/23/2005   [employer] [id number] H - placed in [place]   MANDIBLE SURGERY     ROBOTIC ASSISTED LAPAROSCOPIC CHOLECYSTECTOMY  01/12/2022   TONSILLECTOMY     TOTAL SHOULDER REPLACEMENT     bilateral     Allergies  Allergen Reactions   Amoxicillin Rash and Itching   Bactrim [Sulfamethoxazole-Trimethoprim] Rash    Current Outpatient Medications  Medication Sig Dispense Refill   acetaminophen (TYLENOL) 500 MG tablet Take 500 mg by mouth every 8 (eight) hours as needed.     apixaban (ELIQUIS) 5 MG TABS tablet Take 1 tablet (5 mg total) by mouth 2 (two) times daily. 180 tablet 1   [employer] ZINC 50 MG TABS Take 50 mg by mouth daily.     finasteride (PROSCAR) 5 MG tablet Take 5 mg by mouth daily.     furosemide (LASIX) 40 MG tablet Take by mouth. 3  tablets daily and may take 4 tablets if needed     lovastatin (MEVACOR) 20 MG tablet Take 1 tablet (20 mg total) by mouth at bedtime. 90 tablet 3   nitroGLYCERIN (NITROSTAT) 0.4 MG SL tablet Place 1 tablet (0.4 mg total) under the tongue every 5 (five) minutes as needed for chest pain. 25 tablet 1   omeprazole (PRILOSEC) 20 MG capsule Take 20 mg by mouth as needed.     potassium chloride SA (KLOR-CON) 20 MEQ tablet Take 1 tablet (20 mEq total) by mouth daily. Take with lasix 90 tablet 3   tamsulosin (FLOMAX) 0.4 MG CAPS capsule Take 0.8 mg by mouth daily after breakfast.      fluticasone (FLONASE) 50 MCG/ACT nasal spray Place 2 sprays into both nostrils 2 (two) times daily.     ipratropium (ATROVENT HFA) 17 MCG/ACT inhaler Inhale 2 puffs into the lungs 4 (four) times daily. 1 Inhaler 0   metoprolol succinate (TOPROL-XL) 50 MG 24 hr tablet Take 1 tablet (50 mg total) by mouth in the morning and at bedtime. Take with or immediately following a meal. 90 tablet 3   No current facility-administered medications for this visit.    Family History Family History  Problem Relation Age of Onset   Heart disease Mother    Hypertension Mother    Hyperlipidemia Mother    Heart attack Father  Heart disease Brother       Social History Social History   Tobacco Use   Smoking status: Never   Smokeless tobacco: Never  Vaping Use   Vaping Use: Never used  Substance Use Topics   Alcohol use: No    Comment: social drinker; 1 bottle of beer every 2 weeks.   Drug use: No        Review of Systems  All other systems reviewed and are negative.     Physical Exam Blood pressure 115/80, pulse 87, temperature 97.9 F (36.6 C), temperature source Oral, height 5\' 11"  (1.803 m), weight 170 lb 9.6 oz (77.4 kg), SpO2 97 %. Last Weight  Most recent update: 05/15/2022 11:07 AM    Weight  77.4 kg (170 lb 9.6 oz)             CONSTITUTIONAL: Well developed, and nourished, appropriately responsive and  aware without distress.   EYES: Sclera non-icteric.   EARS, NOSE, MOUTH AND THROAT: The oropharynx is clear. Oral mucosa is pink and moist.    Hearing is intact to voice.  NECK: Trachea is midline, and there is no jugular venous distension.  LYMPH NODES:  Lymph nodes in the neck are not enlarged. RESPIRATORY:  Lungs are clear, and breath sounds are equal bilaterally. Normal respiratory effort without pathologic use of accessory muscles. CARDIOVASCULAR: Heart is regular in rate and rhythm. GI: The abdomen is soft, nontender, and nondistended. There were no palpable masses. I did not appreciate hepatosplenomegaly. There were normal bowel sounds. GU: [place] left inguinal hernia, reducible. MUSCULOSKELETAL:  Symmetrical muscle tone appreciated in all four extremities.    SKIN: Skin turgor is normal. No pathologic skin lesions appreciated.  NEUROLOGIC:  Motor and sensation appear grossly normal.  Cranial nerves are grossly without defect. PSYCH:  Alert and oriented to person, place and time. Affect is appropriate for situation.  Data Reviewed I have personally reviewed what is currently available of the patient's imaging, recent labs and medical records.   Labs:     Latest Ref Rng & Units 01/13/2022    7:52 AM 01/11/2022    4:14 AM 01/10/2022    8:39 PM  CBC  WBC 4.0 - 10.5 K/uL 6.7  8.0  8.3   Hemoglobin 13.0 - 17.0 g/dL 01/12/2022  37.9  02.4   Hematocrit 39.0 - 52.0 % 34.8  39.3  44.2   Platelets 150 - 400 K/uL 137  140  147       Latest Ref Rng & Units 01/13/2022    7:52 AM 01/11/2022    4:14 AM 01/10/2022    8:39 PM  CMP  Glucose 70 - 99 mg/dL 98  01/12/2022  353   BUN 8 - 23 mg/dL 13  13  15    Creatinine 0.61 - 1.24 mg/dL 299   2.42   Sodium 135 - 145 mmol/L 138  140  138   Potassium 3.5 - 5.1 mmol/L 4.0  3.5  3.2   Chloride 98 - 111 mmol/L 110  107  100   CO2 22 - 32 mmol/L 24  27  28    Calcium 8.9 - 10.3 mg/dL 8.1  8.7  9.2   Total Protein 6.5 - 8.1 g/dL  6.2  7.2   Total  Bilirubin 0.3 - 1.2 mg/dL  1.1  1.1   Alkaline Phos 38 - 126 U/L  64  76   AST 15 - 41 U/L  17  16   ALT  0 - 44 U/L  10  9       Imaging: Radiology review:  CLINICAL DATA:  Severe epigastric and lower chest pain, abnormal finding on chest x-ray   EXAM: CT CHEST, ABDOMEN, AND PELVIS WITH CONTRAST   TECHNIQUE: Multidetector CT imaging of the chest, abdomen and pelvis was performed following the standard protocol during bolus administration of intravenous contrast.   RADIATION DOSE REDUCTION: This exam was performed according to the departmental dose-optimization program which includes automated exposure control, adjustment of the mA and/or kV according to patient size and/or use of iterative reconstruction technique.   CONTRAST:  113mL OMNIPAQUE IOHEXOL 300 MG/ML  SOLN   COMPARISON:  01/10/2022, 10/21/2017   FINDINGS: CT CHEST FINDINGS   Cardiovascular: Marked left atrial dilatation. No pericardial effusion. No thoracic aortic aneurysm or dissection. Moderate atherosclerosis of the aorta and coronary vasculature. Dual lead pacemaker is noted.   Mediastinum/Nodes: No enlarged mediastinal, hilar, or axillary lymph nodes. Thyroid gland, trachea, and esophagus demonstrate no significant findings.   Lungs/Pleura: There is a 2.6 x 2.4 cm area of loculated fluid within the right major fissure, corresponding to the chest x-ray finding. No acute airspace disease or pneumothorax. Scattered hypoventilatory changes at the lung bases. Central airways are patent.   Musculoskeletal: Displaced posterior right ninth rib fracture appears chronic. There are 2 fractures in the posterior right tenth rib. One appears chronic, and a second appears subacute with only minimal callus formation. Multiple prior healed left rib fractures are again noted. There are no acute bony abnormalities. Reconstructed images demonstrate no additional findings.   CT ABDOMEN PELVIS FINDINGS    Hepatobiliary: Multiple gallstones. Mild pericholecystic stranding adjacent to the gallbladder fundus, with no gallbladder wall thickening. Findings are equivocal for acute cholecystitis.   The liver is grossly unremarkable, with multiple hepatic cysts identified.   Pancreas: Unremarkable. No pancreatic ductal dilatation or surrounding inflammatory changes.   Spleen: Normal in size without focal abnormality.   Adrenals/Urinary Tract: Multiple bilateral renal cysts. Bilateral nonobstructing renal calculi, measuring up to 11 mm on the right and less than 2 mm on the left. No obstructive uropathy within either kidney. Mild bilateral renal cortical thinning.   The bladder is moderately distended, with trabeculations compatible with chronic bladder outlet obstruction. The adrenals are unremarkable.   Stomach/Bowel: No bowel obstruction or ileus. No bowel wall thickening or inflammatory change. Sigmoid diverticulosis without diverticulitis.   Vascular/Lymphatic: Aortic atherosclerosis. No enlarged abdominal or pelvic lymph nodes.   Reproductive: Prostate is enlarged, measuring 4.7 x 6.1 cm.   Other: No free fluid or free gas. There is a large left inguinal hernia containing a portion of the sigmoid colon. No bowel obstruction or incarceration.   Musculoskeletal: Unremarkable right hip arthroplasty. There are no acute or destructive bony lesions. Reconstructed images demonstrate no additional findings.   IMPRESSION: 1. Cholelithiasis, with pericholecystic fat stranding compatible with acute cholecystitis. If gallbladder pathology is suspected, correlation with ultrasound may be useful. 2. Subacute posterior right tenth rib fracture, with mild callus formation. There also chronic appearing fractures in the right posterior ninth and tenth ribs which may reflect nonunions. No acute bony abnormalities. 3. Large left inguinal hernia containing a portion of the sigmoid colon. No  incarceration or obstruction. 4. Nonobstructing bilateral renal calculi. 5. Small area of loculated pleural fluid within the right major fissure, corresponding to the x-ray finding. 6. Sigmoid diverticulosis without diverticulitis. 7. Multiple benign hepatic and renal cysts. No specific imaging follow-up is required. 8. Enlarged prostate,  with evidence of chronic bladder outlet obstruction. 9.  Aortic Atherosclerosis (ICD10-I70.0). 10. Marked left atrial dilatation.     Electronically Signed   By: Randa Ngo M.D.   On: 01/10/2022 22:27  Within last 24 hrs: No results found.  Assessment    Left inguinal hernia. Patient Active Problem List   Diagnosis Date Noted   Status post laparoscopic cholecystectomy 02/01/2022   Acute lower UTI 01/11/2022   Hypokalemia 01/11/2022   Dyslipidemia 01/11/2022   BPH (benign prostatic hyperplasia) 01/11/2022   GERD without esophagitis 01/11/2022   Loculated pleural effusion 01/11/2022   Atypical chest pain 01/11/2022   Abnormal barium swallow 12/07/2021   At risk for aspiration 12/07/2021   Change in voice 12/07/2021   Bilateral recurrent inguinal hernia without obstruction or gangrene 09/26/2021   Current use of long term anticoagulation 09/26/2021   S/P hip replacement 07/25/2021   Left inguinal hernia 07/25/2021   Dysphagia, 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   Dysphagia 06/22/2016   Low back pain 06/22/2016   Tachy-brady syndrome ([place]) 10/22/2015   IBS (irritable bowel syndrome) 09/14/2015   Acute bronchitis 07/29/2015   Chronic diastolic CHF (congestive heart failure) ([place]) 01/28/2015   Dermatitis fungal 12/14/2014   Nephrolithiasis 12/01/2014   CHF (congestive heart failure) ([place])    Coronary artery disease    Diarrhea 07/30/2014   Encounter for therapeutic drug monitoring 07/14/2014   Numbness and tingling in left hand 05/11/2014   Orthostatic hypotension 04/28/2014   Leg swelling 12/08/2013   Disequilibrium 06/02/2013    Coronary atherosclerosis of native coronary artery 03/12/2013   SOB (shortness of breath) 03/12/2013   A-fib ([place]) 03/12/2013   Hyperlipidemia 03/12/2013   Cardiac pacemaker in situ 03/12/2013   Essential hypertension 03/12/2013   Depression 01/20/2013   Stasis dermatitis of both legs 01/20/2013   Myopia 09/05/2012   Presbyopia 09/05/2012   Tear film insufficiency 09/05/2012   Vitreous opacity 09/05/2012   Primary localized osteoarthrosis, lower leg 11/29/2010   History of nonmelanoma skin cancer 10/25/2010   Myalgia and myositis 12/04/2005    Plan    Robotic left inguinal hernia repair.  We will hold Eliquis 2 days preop.   I discussed possibility of incarceration, strangulation, enlargement in size over time, and the need for emergency surgery in the face of these.  Also reviewed the techniques of reduction should incarceration occur, and when unsuccessful to present to the ED.  Also discussed that surgery risks include recurrence which can be up to 30% in the case of complex hernias, use of prosthetic materials (mesh) and the increased risk of infection and the possible need for re-operation and removal of mesh, possibility of post-op SBO or ileus, and the risks of general anesthetic including heart attack, stroke, sudden death or some reaction to anesthetic medications. The patient, and those present, appear to understand the risks, any and all questions were answered to the patient's satisfaction.  No guarantees were ever expressed or implied.  Face-to-face time spent with the patient and accompanying care providers(if present) was 30 minutes, with more than 50% of the time spent counseling, educating, and coordinating care of the patient.    These notes generated with voice recognition software. I apologize for typographical errors.  Ronny Bacon M.D., FACS 05/15/2022, 12:39 PM

## 2022-05-15 NOTE — Progress Notes (Signed)
Patient ID: Kyle Morales, male   DOB: 02-06-1936, 86 y.o.   MRN: [id number]  Chief Complaint: Left inguinal hernia  History of Present Illness Kyle Morales is a 86 y.o. male with left inguinal hernia, symptoms involve sporadic left groin pain, difficulty with voiding.  Increased discomfort with activities.  Concerns raised about exacerbation with or while traveling.  Worsened with cough or sneezing.  Past Medical History Past Medical History:  Diagnosis Date   CHF (congestive heart failure) (HCC)    Coronary artery disease    Eczema    Hyperlipidemia    Hypertension    Osteoarthrosis    Paroxysmal A-fib (HCC)    Presence of permanent cardiac pacemaker    S/P cardiac pacemaker procedure       Past Surgical History:  Procedure Laterality Date   CARDIAC CATHETERIZATION  09/25/1995   CARDIAC CATHETERIZATION  09/24/1998   COLONOSCOPY  09/24/2010   CORONARY ANGIOPLASTY  09/25/1995   ramus   ESOPHAGOGASTRODUODENOSCOPY N/A 02/11/2017   Procedure: ESOPHAGOGASTRODUODENOSCOPY (EGD);  Surgeon: Christena Deem, MD;  Location: [facility] ENDOSCOPY;  Service: Endoscopy;  Laterality: N/A;   HERNIA REPAIR     INSERT / REPLACE / REMOVE PACEMAKER  12/23/2005   [employer] [id number] - placed in [place]   MANDIBLE SURGERY     ROBOTIC ASSISTED LAPAROSCOPIC CHOLECYSTECTOMY  01/12/2022   TONSILLECTOMY     TOTAL SHOULDER REPLACEMENT     bilateral     Allergies  Allergen Reactions   Amoxicillin Rash and Itching   Bactrim [Sulfamethoxazole-Trimethoprim] Rash    Current Outpatient Medications  Medication Sig Dispense Refill   acetaminophen (TYLENOL) 500 MG tablet Take 500 mg by mouth every 8 (eight) hours as needed.     apixaban (ELIQUIS) 5 MG TABS tablet Take 1 tablet (5 mg total) by mouth 2 (two) times daily. 180 tablet 1   [employer] ZINC 50 MG TABS Take 50 mg by mouth daily.     finasteride (PROSCAR) 5 MG tablet Take 5 mg by mouth daily.     furosemide (LASIX) 40 MG tablet Take by mouth. 3  tablets daily and may take 4 tablets if needed     lovastatin (MEVACOR) 20 MG tablet Take 1 tablet (20 mg total) by mouth at bedtime. 90 tablet 3   nitroGLYCERIN (NITROSTAT) 0.4 MG SL tablet Place 1 tablet (0.4 mg total) under the tongue every 5 (five) minutes as needed for chest pain. 25 tablet 1   omeprazole (PRILOSEC) 20 MG capsule Take 20 mg by mouth as needed.     potassium chloride SA (KLOR-CON) 20 MEQ tablet Take 1 tablet (20 mEq total) by mouth daily. Take with lasix 90 tablet 3   tamsulosin (FLOMAX) 0.4 MG CAPS capsule Take 0.8 mg by mouth daily after breakfast.      fluticasone (FLONASE) 50 MCG/ACT nasal spray Place 2 sprays into both nostrils 2 (two) times daily.     ipratropium (ATROVENT HFA) 17 MCG/ACT inhaler Inhale 2 puffs into the lungs 4 (four) times daily. 1 Inhaler 0   metoprolol succinate (TOPROL-XL) 50 MG 24 hr tablet Take 1 tablet (50 mg total) by mouth in the morning and at bedtime. Take with or immediately following a meal. 90 tablet 3   No current facility-administered medications for this visit.    Family History Family History  Problem Relation Age of Onset   Heart disease Mother    Hypertension Mother    Hyperlipidemia Mother    Heart attack Father  Heart disease Brother       Social History Social History   Tobacco Use   Smoking status: Never   Smokeless tobacco: Never  Vaping Use   Vaping Use: Never used  Substance Use Topics   Alcohol use: No    Comment: social drinker; 1 bottle of beer every 2 weeks.   Drug use: No        Review of Systems  All other systems reviewed and are negative.     Physical Exam Blood pressure 115/80, pulse 87, temperature 97.9 F (36.6 C), temperature source Oral, height 5\' 11"  (1.803 m), weight 170 lb 9.6 oz (77.4 kg), SpO2 97 %. Last Weight  Most recent update: 05/15/2022 11:07 AM    Weight  77.4 kg (170 lb 9.6 oz)             CONSTITUTIONAL: Well developed, and nourished, appropriately responsive and  aware without distress.   EYES: Sclera non-icteric.   EARS, NOSE, MOUTH AND THROAT: The oropharynx is clear. Oral mucosa is pink and moist.    Hearing is intact to voice.  NECK: Trachea is midline, and there is no jugular venous distension.  LYMPH NODES:  Lymph nodes in the neck are not enlarged. RESPIRATORY:  Lungs are clear, and breath sounds are equal bilaterally. Normal respiratory effort without pathologic use of accessory muscles. CARDIOVASCULAR: Heart is regular in rate and rhythm. GI: The abdomen is soft, nontender, and nondistended. There were no palpable masses. I did not appreciate hepatosplenomegaly. There were normal bowel sounds. GU: [place] left inguinal hernia, reducible. MUSCULOSKELETAL:  Symmetrical muscle tone appreciated in all four extremities.    SKIN: Skin turgor is normal. No pathologic skin lesions appreciated.  NEUROLOGIC:  Motor and sensation appear grossly normal.  Cranial nerves are grossly without defect. PSYCH:  Alert and oriented to person, place and time. Affect is appropriate for situation.  Data Reviewed I have personally reviewed what is currently available of the patient's imaging, recent labs and medical records.   Labs:     Latest Ref Rng & Units 01/13/2022    7:52 AM 01/11/2022    4:14 AM 01/10/2022    8:39 PM  CBC  WBC 4.0 - 10.5 K/uL 6.7  8.0  8.3   Hemoglobin 13.0 - 17.0 g/dL 01/12/2022  37.9  02.4   Hematocrit 39.0 - 52.0 % 34.8  39.3  44.2   Platelets 150 - 400 K/uL 137  140  147       Latest Ref Rng & Units 01/13/2022    7:52 AM 01/11/2022    4:14 AM 01/10/2022    8:39 PM  CMP  Glucose 70 - 99 mg/dL 98  01/12/2022  353   BUN 8 - 23 mg/dL 13  13  15    Creatinine 0.61 - 1.24 mg/dL 299   2.42   Sodium 135 - 145 mmol/L 138  140  138   Potassium 3.5 - 5.1 mmol/L 4.0  3.5  3.2   Chloride 98 - 111 mmol/L 110  107  100   CO2 22 - 32 mmol/L 24  27  28    Calcium 8.9 - 10.3 mg/dL 8.1  8.7  9.2   Total Protein 6.5 - 8.1 g/dL  6.2  7.2   Total  Bilirubin 0.3 - 1.2 mg/dL  1.1  1.1   Alkaline Phos 38 - 126 U/L  64  76   AST 15 - 41 U/L  17  16   ALT  0 - 44 U/L  10  9       Imaging: Radiology review:  CLINICAL DATA:  Severe epigastric and lower chest pain, abnormal finding on chest x-ray   EXAM: CT CHEST, ABDOMEN, AND PELVIS WITH CONTRAST   TECHNIQUE: Multidetector CT imaging of the chest, abdomen and pelvis was performed following the standard protocol during bolus administration of intravenous contrast.   RADIATION DOSE REDUCTION: This exam was performed according to the departmental dose-optimization program which includes automated exposure control, adjustment of the mA and/or kV according to patient size and/or use of iterative reconstruction technique.   CONTRAST:  100mL OMNIPAQUE IOHEXOL 300 MG/ML  SOLN   COMPARISON:  01/10/2022, 10/21/2017   FINDINGS: CT CHEST FINDINGS   Cardiovascular: Marked left atrial dilatation. No pericardial effusion. No thoracic aortic aneurysm or dissection. Moderate atherosclerosis of the aorta and coronary vasculature. Dual lead pacemaker is noted.   Mediastinum/Nodes: No enlarged mediastinal, hilar, or axillary lymph nodes. Thyroid gland, trachea, and esophagus demonstrate no significant findings.   Lungs/Pleura: There is a 2.6 x 2.4 cm area of loculated fluid within the right major fissure, corresponding to the chest x-ray finding. No acute airspace disease or pneumothorax. Scattered hypoventilatory changes at the lung bases. Central airways are patent.   Musculoskeletal: Displaced posterior right ninth rib fracture appears chronic. There are 2 fractures in the posterior right tenth rib. One appears chronic, and a second appears subacute with only minimal callus formation. Multiple prior healed left rib fractures are again noted. There are no acute bony abnormalities. Reconstructed images demonstrate no additional findings.   CT ABDOMEN PELVIS FINDINGS    Hepatobiliary: Multiple gallstones. Mild pericholecystic stranding adjacent to the gallbladder fundus, with no gallbladder wall thickening. Findings are equivocal for acute cholecystitis.   The liver is grossly unremarkable, with multiple hepatic cysts identified.   Pancreas: Unremarkable. No pancreatic ductal dilatation or surrounding inflammatory changes.   Spleen: Normal in size without focal abnormality.   Adrenals/Urinary Tract: Multiple bilateral renal cysts. Bilateral nonobstructing renal calculi, measuring up to 11 mm on the right and less than 2 mm on the left. No obstructive uropathy within either kidney. Mild bilateral renal cortical thinning.   The bladder is moderately distended, with trabeculations compatible with chronic bladder outlet obstruction. The adrenals are unremarkable.   Stomach/Bowel: No bowel obstruction or ileus. No bowel wall thickening or inflammatory change. Sigmoid diverticulosis without diverticulitis.   Vascular/Lymphatic: Aortic atherosclerosis. No enlarged abdominal or pelvic lymph nodes.   Reproductive: Prostate is enlarged, measuring 4.7 x 6.1 cm.   Other: No free fluid or free gas. There is a large left inguinal hernia containing a portion of the sigmoid colon. No bowel obstruction or incarceration.   Musculoskeletal: Unremarkable right hip arthroplasty. There are no acute or destructive bony lesions. Reconstructed images demonstrate no additional findings.   IMPRESSION: 1. Cholelithiasis, with pericholecystic fat stranding compatible with acute cholecystitis. If gallbladder pathology is suspected, correlation with ultrasound may be useful. 2. Subacute posterior right tenth rib fracture, with mild callus formation. There also chronic appearing fractures in the right posterior ninth and tenth ribs which may reflect nonunions. No acute bony abnormalities. 3. Large left inguinal hernia containing a portion of the sigmoid colon. No  incarceration or obstruction. 4. Nonobstructing bilateral renal calculi. 5. Small area of loculated pleural fluid within the right major fissure, corresponding to the x-ray finding. 6. Sigmoid diverticulosis without diverticulitis. 7. Multiple benign hepatic and renal cysts. No specific imaging follow-up is required. 8. Enlarged prostate,   with evidence of chronic bladder outlet obstruction. 9.  Aortic Atherosclerosis (ICD10-I70.0). 10. Marked left atrial dilatation.     Electronically Signed   By: Michael  Brown M.D.   On: 01/10/2022 22:27  Within last 24 hrs: No results found.  Assessment    Left inguinal hernia. Patient Active Problem List   Diagnosis Date Noted   Status post laparoscopic cholecystectomy 02/01/2022   Acute lower UTI 01/11/2022   Hypokalemia 01/11/2022   Dyslipidemia 01/11/2022   BPH (benign prostatic hyperplasia) 01/11/2022   GERD without esophagitis 01/11/2022   Loculated pleural effusion 01/11/2022   Atypical chest pain 01/11/2022   Abnormal barium swallow 12/07/2021   At risk for aspiration 12/07/2021   Change in voice 12/07/2021   Bilateral recurrent inguinal hernia without obstruction or gangrene 09/26/2021   Current use of long term anticoagulation 09/26/2021   S/P hip replacement 07/25/2021   Left inguinal hernia 07/25/2021   Dysphagia, 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   Dysphagia 06/22/2016   Low back pain 06/22/2016   Tachy-brady syndrome (HCC) 10/22/2015   IBS (irritable bowel syndrome) 09/14/2015   Acute bronchitis 07/29/2015   Chronic diastolic CHF (congestive heart failure) (HCC) 01/28/2015   Dermatitis fungal 12/14/2014   Nephrolithiasis 12/01/2014   CHF (congestive heart failure) (HCC)    Coronary artery disease    Diarrhea 07/30/2014   Encounter for therapeutic drug monitoring 07/14/2014   Numbness and tingling in left hand 05/11/2014   Orthostatic hypotension 04/28/2014   Leg swelling 12/08/2013   Disequilibrium 06/02/2013    Coronary atherosclerosis of native coronary artery 03/12/2013   SOB (shortness of breath) 03/12/2013   A-fib (HCC) 03/12/2013   Hyperlipidemia 03/12/2013   Cardiac pacemaker in situ 03/12/2013   Essential hypertension 03/12/2013   Depression 01/20/2013   Stasis dermatitis of both legs 01/20/2013   Myopia 09/05/2012   Presbyopia 09/05/2012   Tear film insufficiency 09/05/2012   Vitreous opacity 09/05/2012   Primary localized osteoarthrosis, lower leg 11/29/2010   History of nonmelanoma skin cancer 10/25/2010   Myalgia and myositis 12/04/2005    Plan    Robotic left inguinal hernia repair.  We will hold Eliquis 2 days preop.   I discussed possibility of incarceration, strangulation, enlargement in size over time, and the need for emergency surgery in the face of these.  Also reviewed the techniques of reduction should incarceration occur, and when unsuccessful to present to the ED.  Also discussed that surgery risks include recurrence which can be up to 30% in the case of complex hernias, use of prosthetic materials (mesh) and the increased risk of infection and the possible need for re-operation and removal of mesh, possibility of post-op SBO or ileus, and the risks of general anesthetic including heart attack, stroke, sudden death or some reaction to anesthetic medications. The patient, and those present, appear to understand the risks, any and all questions were answered to the patient's satisfaction.  No guarantees were ever expressed or implied.  Face-to-face time spent with the patient and accompanying care providers(if present) was 30 minutes, with more than 50% of the time spent counseling, educating, and coordinating care of the patient.    These notes generated with voice recognition software. I apologize for typographical errors.    M.D., FACS 05/15/2022, 12:39 PM     

## 2022-05-16 ENCOUNTER — Telehealth: Payer: Self-pay | Admitting: *Deleted

## 2022-05-16 ENCOUNTER — Telehealth: Payer: Self-pay | Admitting: Surgery

## 2022-05-16 NOTE — Telephone Encounter (Signed)
I s/w both the pt and his wife and pt is agreeable to plan of care. Tele pre op appt 05/29/22 @ 10:20, med rec and consent are done.      Patient Consent for Virtual Visit        Kyle Morales has provided verbal consent on 05/16/2022 for a virtual visit (video or telephone).   CONSENT FOR VIRTUAL VISIT FOR:  Kyle Morales  By participating in this virtual visit I agree to the following:  I hereby voluntarily request, consent and authorize [facility] and its employed or contracted physicians, physician assistants, nurse practitioners or other licensed health care professionals (the Practitioner), to provide me with telemedicine health care services (the "Services") as deemed necessary by the treating Practitioner. I acknowledge and consent to receive the Services by the Practitioner via telemedicine. I understand that the telemedicine visit will involve communicating with the Practitioner through live audiovisual communication technology and the disclosure of certain medical information by electronic transmission. I acknowledge that I have been given the opportunity to request an in-person assessment or other available alternative prior to the telemedicine visit and am voluntarily participating in the telemedicine visit.  I understand that I have the right to withhold or withdraw my consent to the use of telemedicine in the course of my care at any time, without affecting my right to future care or treatment, and that the Practitioner or I may terminate the telemedicine visit at any time. I understand that I have the right to inspect all information obtained and/or recorded in the course of the telemedicine visit and may receive copies of available information for a reasonable fee.  I understand that some of the potential risks of receiving the Services via telemedicine include:  Delay or interruption in medical evaluation due to technological equipment failure or disruption; Information  transmitted may not be sufficient (e.g. poor resolution of images) to allow for appropriate medical decision making by the Practitioner; and/or  In rare instances, security protocols could fail, causing a breach of personal health information.  Furthermore, I acknowledge that it is my responsibility to provide information about my medical history, conditions and care that is complete and accurate to the best of my ability. I acknowledge that Practitioner's advice, recommendations, and/or decision may be based on factors not within their control, such as incomplete or inaccurate data provided by me or distortions of diagnostic images or specimens that may result from electronic transmissions. I understand that the practice of medicine is not an exact science and that Practitioner makes no warranties or guarantees regarding treatment outcomes. I acknowledge that a copy of this consent can be made available to me via my patient portal [facility] MyChart), or I can request a printed copy by calling the office of [facility].    I understand that my insurance will be billed for this visit.   I have read or had this consent read to me. I understand the contents of this consent, which adequately explains the benefits and risks of the Services being provided via telemedicine.  I have been provided ample opportunity to ask questions regarding this consent and the Services and have had my questions answered to my satisfaction. I give my informed consent for the services to be provided through the use of telemedicine in my medical care

## 2022-05-16 NOTE — Telephone Encounter (Signed)
Pharmacy has addressed anticoagulation.  Preoperative team, please contact this patient and set up a phone call appointment for further cardiac evaluation.  Thank you for your help.  Thomasene Ripple.  NP-C    05/16/2022, 9:32 AM [facility] [address] Office [phone] Fax [phone]

## 2022-05-16 NOTE — Telephone Encounter (Signed)
Wife called wanting to know if patient will need to see Dr. Mariah Milling before this surgery and when the patient will need to stop his Eliquis prior to the hernia surgery.  Wife stated message can be left on their voice mail.

## 2022-05-16 NOTE — Telephone Encounter (Signed)
I s/w both the pt and his wife and pt is agreeable to plan of care. Tele pre op appt 05/29/22 @ 10:20, med rec and consent are done.

## 2022-05-16 NOTE — Telephone Encounter (Signed)
Patient has been advised of Pre-Admission date/time, and Surgery date.  Surgery Date: 06/08/22 @ [facility] Preadmission Testing Date: 05/30/22 (phone 8a-1p)  Patient has been made aware to call [phone], between 1-3:00pm the day before surgery, to find out what time to arrive for surgery.    

## 2022-05-28 NOTE — Progress Notes (Signed)
Virtual Visit via Telephone Note   Because of Kyle Morales's co-morbid illnesses, he is at least at moderate risk for complications without adequate follow up.  This format is felt to be most appropriate for this patient at this time.  The patient did not have access to video technology/had technical difficulties with video requiring transitioning to audio format only (telephone).  All issues noted in this document were discussed and addressed.  No physical exam could be performed with this format.  Please refer to the patient's chart for his consent to telehealth for [facility].  Evaluation Performed:  Preoperative cardiovascular risk assessment _____________   Date:  05/29/2022   Patient ID:  Kyle Morales, DOB 04/14/1936, MRN [id number] Patient Location:  Home Provider location:   Office  Primary Care Provider:  Katrinka Blazing, MD Primary Cardiologist:  Julien Nordmann, MD  Chief Complaint / Patient Profile   86 y.o. y/o male with a h/o sinus node dysfunction s/p PPM implant, atrial fibrillation on chronic anticoagulation, CAD with prior stent in 1997, and fatigue who is pending inguinal hernia repair and presents today for telephonic preoperative cardiovascular risk assessment.  Past Medical History    Past Medical History:  Diagnosis Date   CHF (congestive heart failure) (HCC)    Coronary artery disease    Eczema    Hyperlipidemia    Hypertension    Osteoarthrosis    Paroxysmal A-fib (HCC)    Presence of permanent cardiac pacemaker    S/P cardiac pacemaker procedure    Past Surgical History:  Procedure Laterality Date   CARDIAC CATHETERIZATION  09/25/1995   CARDIAC CATHETERIZATION  09/24/1998   COLONOSCOPY  09/24/2010   CORONARY ANGIOPLASTY  09/25/1995   ramus   ESOPHAGOGASTRODUODENOSCOPY N/A 02/11/2017   Procedure: ESOPHAGOGASTRODUODENOSCOPY (EGD);  Surgeon: Christena Deem, MD;  Location: [facility] ENDOSCOPY;  Service: Endoscopy;  Laterality: N/A;    HERNIA REPAIR     INSERT / REPLACE / REMOVE PACEMAKER  12/23/2005   Medtronic [id number] - placed in [place]   MANDIBLE SURGERY     ROBOTIC ASSISTED LAPAROSCOPIC CHOLECYSTECTOMY  01/12/2022   TONSILLECTOMY     TOTAL SHOULDER REPLACEMENT     bilateral     Allergies  Allergies  Allergen Reactions   Amoxicillin Rash and Itching   Bactrim [Sulfamethoxazole-Trimethoprim] Rash    History of Present Illness    Kyle Morales is a 86 y.o. male who presents via audio/video conferencing for a telehealth visit today.  Pt was last seen in cardiology clinic on 09/04/21 by Dr. Graciela Husbands.  At that time Kyle Morales was experiencing fatigue.  He had a trial off metoprolol to determine if symptoms improved which they did not and he resumed metoprolol 50 mg daily. The patient is now pending procedure as outlined above. Since his last visit, he  denies chest pain, shortness of breath, lower extremity edema, palpitations, melena, hematuria, hemoptysis, diaphoresis, weakness, presyncope, syncope, orthopnea, and PND. He reports BP is well-controlled. Is frustrated by altered gait at times. He continues to have some fatigue but does not feel that it is 2/2 cardiac symptoms.  He remains physically active without any concerns.  Home Medications    Prior to Admission medications   Medication Sig Start Date End Date Taking? Authorizing Provider  acetaminophen (TYLENOL) 500 MG tablet Take 500 mg by mouth every 8 (eight) hours as needed.    [provider]  apixaban (ELIQUIS) 5 MG TABS tablet Take 1 tablet (5  mg total) by mouth 2 (two) times daily. 07/18/21   Duke Salvia, MD  [facility] ZINC 50 MG TABS Take 50 mg by mouth daily.    [provider]  finasteride (PROSCAR) 5 MG tablet Take 5 mg by mouth daily. 04/03/19   [provider]  fluticasone (FLONASE) 50 MCG/ACT nasal spray Place 2 sprays into both nostrils 2 (two) times daily. 04/20/16 05/16/22  [provider]  furosemide  (LASIX) 40 MG tablet Take by mouth. 3 tablets daily and may take 4 tablets if needed    [provider]  ipratropium (ATROVENT HFA) 17 MCG/ACT inhaler Inhale 2 puffs into the lungs 4 (four) times daily. 03/18/18 05/16/22  Duke Salvia, MD  lovastatin (MEVACOR) 20 MG tablet Take 1 tablet (20 mg total) by mouth at bedtime. 08/07/21   Antonieta Iba, MD  metoprolol succinate (TOPROL-XL) 50 MG 24 hr tablet Take 1 tablet (50 mg total) by mouth in the morning and at bedtime. Take with or immediately following a meal. 11/03/21 05/16/22  Duke Salvia, MD  nitroGLYCERIN (NITROSTAT) 0.4 MG SL tablet Place 1 tablet (0.4 mg total) under the tongue every 5 (five) minutes as needed for chest pain. 07/29/15   Antonieta Iba, MD  omeprazole (PRILOSEC) 20 MG capsule Take 20 mg by mouth as needed.    [provider]  potassium chloride SA (KLOR-CON) 20 MEQ tablet Take 1 tablet (20 mEq total) by mouth daily. Take with lasix 08/07/21   Antonieta Iba, MD  tamsulosin (FLOMAX) 0.4 MG CAPS capsule Take 0.8 mg by mouth daily after breakfast.     [provider]    Physical Exam    Vital Signs:  Kyle Morales does not have vital signs available for review today.  Given telephonic nature of communication, physical exam is limited. AAOx3. NAD. Normal affect.  Speech and respirations are unlabored.  Accessory Clinical Findings    None  Assessment & Plan    1.  Preoperative Cardiovascular Risk Assessment: The patient is doing well from a cardiac perspective. Therefore, based on ACC/AHA guidelines, the patient would be at acceptable risk for the planned procedure without further cardiovascular testing. The patient was advised that if he develops new symptoms prior to surgery to contact our office to arrange for a follow-up visit, and he verbalized understanding. According to the Revised Cardiac Risk Index (RCRI), his Perioperative Risk of Major Cardiac Event is (%): 6.6. His Functional  Capacity in METs is: 7.04 according to the Duke Activity Status Index (DASI).  Per office protocol, patient can hold Eliquis for 2-3 days prior to procedure. He plans to hold after taking the evening dose on 06/05/22.  Advised him to resume as soon as cleared by surgeon.  A copy of this note will be routed to requesting surgeon.  Time:   Today, I have spent 10 minutes with the patient with telehealth technology discussing medical history, symptoms, and management plan.     Levi Aland, NP-C    05/29/2022, 10:26 AM [address], [address] Office [phone] Fax [phone]

## 2022-05-29 ENCOUNTER — Ambulatory Visit: Payer: Medicare Other | Attending: Cardiovascular Disease | Admitting: Nurse Practitioner

## 2022-05-29 ENCOUNTER — Encounter: Payer: Self-pay | Admitting: Nurse Practitioner

## 2022-05-29 DIAGNOSIS — Z01810 Encounter for preprocedural cardiovascular examination: Secondary | ICD-10-CM | POA: Insufficient documentation

## 2022-05-29 NOTE — Progress Notes (Unsigned)
Cardiac Clearance has been obtained from Dr Windell Hummingbird office at [facility]. The patient is at acceptable risk for surgery. He may hold his Eliquis 2-3 days prior to the procedure. All notes are in Epic.

## 2022-05-30 ENCOUNTER — Encounter
Admission: RE | Admit: 2022-05-30 | Discharge: 2022-05-30 | Disposition: A | Discharge status: Home / Self Care | Payer: Medicare Other | Source: Ambulatory Visit | Attending: Surgery | Admitting: Surgery

## 2022-05-30 ENCOUNTER — Encounter: Payer: Self-pay | Admitting: Internal Medicine

## 2022-05-30 ENCOUNTER — Other Ambulatory Visit: Payer: Self-pay

## 2022-05-30 VITALS — Ht 71.0 in | Wt 163.0 lb

## 2022-05-30 DIAGNOSIS — I2510 Atherosclerotic heart disease of native coronary artery without angina pectoris: Secondary | ICD-10-CM

## 2022-05-30 DIAGNOSIS — Z01812 Encounter for preprocedural laboratory examination: Secondary | ICD-10-CM

## 2022-05-30 DIAGNOSIS — Z79899 Other long term (current) drug therapy: Secondary | ICD-10-CM

## 2022-05-30 DIAGNOSIS — I5032 Chronic diastolic (congestive) heart failure: Secondary | ICD-10-CM

## 2022-05-30 HISTORY — DX: Gastro-esophageal reflux disease without esophagitis: K21.9

## 2022-05-30 HISTORY — DX: Depression, unspecified: F32.A

## 2022-05-30 NOTE — Progress Notes (Signed)
PERIOPERATIVE PRESCRIPTION FOR IMPLANTED CARDIAC DEVICE PROGRAMMING  Patient Information: Name:  ADI DORO  DOB:  Jul 21, 1936  MRN:  [id number]    Planned Procedure: LEFT XI ROBOTIC ASSISTED INGUINAL HERNIA    Surgeon:  Dr. Claudine Mouton, MD  Requesting device clearance: Quentin Mulling, FNP-C  Date of Procedure:  06/08/2022  Cautery will be used.   Please route documentation back me via [facility], or may fax report to [facility] PAT APP at [phone].   Device Information:  Clinic EP Physician:  Sherryl Manges, MD   Device Type:  Pacemaker Manufacturer and Phone #:  Medtronic: [id number] Pacemaker Dependent?:  No. Date of Last Device Check:  05/01/22 Normal Device Function?:  Yes.    Electrophysiologist's Recommendations:  Have magnet available. Provide continuous ECG monitoring when magnet is used or reprogramming is to be performed.  Procedure may interfere with device function.  Magnet should be placed over device during procedure.  Per Device Clinic Standing Orders, Lenor Coffin, RN  4:34 PM 05/30/2022

## 2022-05-30 NOTE — Patient Instructions (Signed)
Your procedure is scheduled on:June 08, 2022 FRIDAY Report to the Registration Desk on the 1st floor of the [facility]. To find out your arrival time, please call [phone] between 1PM - 3PM on: June 07, 2022 THURSDAY If your arrival time is 6:00 am, do not arrive prior to that time as the [facility] entrance doors do not open until 6:00 am.  REMEMBER: Instructions that are not followed completely may result in serious medical risk, up to and including death; or upon the discretion of your surgeon and anesthesiologist your surgery may need to be rescheduled.  Do not EAT FOOD OR DRINK ANYTHING after midnight the night before surgery.  No gum chewing, lozengers or hard candies.  TAKE THESE MEDICATIONS THE MORNING OF SURGERY WITH A SIP OF WATER: METOPROLOL SERTRALINE OMEPRAZOLE (take one the night before and one on the morning of surgery - helps to prevent nausea after surgery.)  Use inhalers on the day of surgery   STOP ELIQUIS LAST DOSE 06/05/2022  One week prior to surgery: Stop Anti-inflammatories (NSAIDS) such as Advil, Aleve, Ibuprofen, Motrin, Naproxen, Naprosyn and Aspirin based products such as Excedrin, Goodys Powder, BC Powder. Stop ANY OVER THE COUNTER supplements until after surgery. You may however, continue to take Tylenol if needed for pain up until the day of surgery.  No Alcohol for 24 hours before or after surgery.  No Smoking including e-cigarettes for 24 hours prior to surgery.  No chewable tobacco products for at least 6 hours prior to surgery.  No nicotine patches on the day of surgery.  Do not use any "recreational" drugs for at least a week prior to your surgery.  Please be advised that the combination of cocaine and anesthesia may have negative outcomes, up to and including death. If you test positive for cocaine, your surgery will be cancelled.  On the morning of surgery brush your teeth with toothpaste and water, you may rinse your mouth  with mouthwash if you wish. Do not swallow any toothpaste or mouthwash.  Use CHG Soap as directed on instruction sheet.  Do not wear jewelry, make-up, hairpins, clips or nail polish.  Do not wear lotions, powders, or perfumes OR DEODORANT   Do not shave body from the neck down 48 hours prior to surgery just in case you cut yourself which could leave a site for infection.  Also, freshly shaved skin may become irritated if using the CHG soap.  Contact lenses, hearing aids and dentures may not be worn into surgery.  Do not bring valuables to the hospital. [facility] is not responsible for any missing/lost belongings or valuables.   Notify your doctor if there is any change in your medical condition (cold, fever, infection).  Wear comfortable clothing (specific to your surgery type) to the hospital.  After surgery, you can help prevent lung complications by doing breathing exercises.  Take deep breaths and cough every 1-2 hours. Your doctor may order a device called an Incentive Spirometer to help you take deep breaths. When coughing or sneezing, hold a pillow firmly against your incision with both hands. This is called "splinting." Doing this helps protect your incision. It also decreases belly discomfort.  If you are being discharged the day of surgery, you will not be allowed to drive home. You will need a responsible adult (18 years or older) to drive you home and stay with you that night.   If you are taking public transportation, you will need to have a responsible  adult (18 years or older) with you. Please confirm with your physician that it is acceptable to use public transportation.   Please call the Pre-admissions Testing Dept. at [phone] if you have any questions about these instructions.  Surgery Visitation Policy:  Patients undergoing a surgery or procedure may have two family members or support persons with them as long as the person is not COVID-19 positive or  experiencing its symptoms.

## 2022-05-31 ENCOUNTER — Encounter
Admission: RE | Admit: 2022-05-31 | Discharge: 2022-05-31 | Disposition: A | Discharge status: Home / Self Care | Payer: Medicare Other | Source: Ambulatory Visit | Attending: Surgery | Admitting: Surgery

## 2022-05-31 DIAGNOSIS — I2510 Atherosclerotic heart disease of native coronary artery without angina pectoris: Secondary | ICD-10-CM | POA: Diagnosis not present

## 2022-05-31 DIAGNOSIS — Z01812 Encounter for preprocedural laboratory examination: Secondary | ICD-10-CM | POA: Diagnosis present

## 2022-05-31 DIAGNOSIS — Z79899 Other long term (current) drug therapy: Secondary | ICD-10-CM | POA: Diagnosis not present

## 2022-05-31 LAB — COMPREHENSIVE METABOLIC PANEL
ALT: 13 U/L (ref 0–44)
AST: 20 U/L (ref 15–41)
Albumin: 4.1 g/dL (ref 3.5–5.0)
Alkaline Phosphatase: 59 U/L (ref 38–126)
Anion gap: 8 (ref 5–15)
BUN: 14 mg/dL (ref 8–23)
CO2: 28 mmol/L (ref 22–32)
Calcium: 9.1 mg/dL (ref 8.9–10.3)
Chloride: 105 mmol/L (ref 98–111)
Creatinine, Ser: 0.94 mg/dL (ref 0.61–1.24)
GFR, Estimated: >60 mL/min (ref >60)
Glucose, Bld: 123 mg/dL — ABNORMAL HIGH (ref 70–99)
Potassium: 3.5 mmol/L (ref 3.5–5.1)
Sodium: 141 mmol/L (ref 135–145)
Total Bilirubin: 1.4 mg/dL — ABNORMAL HIGH (ref 0.3–1.2)
Total Protein: 6.9 g/dL (ref 6.5–8.1)

## 2022-05-31 LAB — CBC
HCT: 42.8 % (ref 39.0–52.0)
Hemoglobin: 14.2 g/dL (ref 13.0–17.0)
MCH: 32.6 pg (ref 26.0–34.0)
MCHC: 33.2 g/dL (ref 30.0–36.0)
MCV: 98.2 fL (ref 80.0–100.0)
Platelets: 156 10*3/uL (ref 150–400)
RBC: 4.36 MIL/uL (ref 4.22–5.81)
RDW: 12.9 % (ref 11.5–15.5)
WBC: 5.6 10*3/uL (ref 4.0–10.5)
nRBC: 0 % (ref 0.0–0.2)

## 2022-06-04 ENCOUNTER — Encounter: Payer: Self-pay | Admitting: Surgery

## 2022-06-04 DIAGNOSIS — J3489 Other specified disorders of nose and nasal sinuses: Secondary | ICD-10-CM | POA: Insufficient documentation

## 2022-06-04 NOTE — Progress Notes (Incomplete)
Perioperative Services  Pre-Admission/Anesthesia Testing Clinical Review  Date: 06/05/22  Patient Demographics:  Name: Kyle Morales DOB:   07/04/1936 MRN:   [id number]  Planned Surgical Procedure(s):    Case: [id number] Date/Time: 06/08/22 1001   Procedure: XI ROBOTIC ASSISTED INGUINAL HERNIA (Left)   Anesthesia type: General   Pre-op diagnosis: left inguinal hernia   Location: [facility] OR ROOM 04 / [facility]   Surgeons: Campbell Lerner, MD   NOTE: Available PAT nursing documentation and vital signs have been reviewed. Clinical nursing staff has updated patient's PMH/PSHx, current medication list, and drug allergies/intolerances to ensure comprehensive history available to assist in medical decision making as it pertains to the aforementioned surgical procedure and anticipated anesthetic course. Extensive review of available clinical information performed. [place] PMH and PSHx updated with any diagnoses/procedures that  may have been inadvertently omitted during his intake with the pre-admission testing department's nursing staff.  Clinical Discussion:  Kyle Morales is a 86 y.o. male who is submitted for pre-surgical anesthesia review and clearance prior to him undergoing the above procedure. Patient has never been a smoker. Pertinent PMH includes: CAD, PAF, CHF, NSVT, SSS/CHB (s/p PPM placement), RBBB, aortic root dilatation, aortic atherosclerosis, HTN, HLD, GERD (on daily PPI), pharyngeal dysphagia, OA, LEFT inguinal hernia, BPH.  Patient is followed by cardiology Mariah Milling, MD). He was last seen in the cardiology clinic on 10/03/2021; notes reviewed.  At the time of his clinic visit, patient doing well overall from a cardiovascular perspective.  He denied any episodes of chest pain, shortness of breath, PND, orthopnea, significant peripheral edema, palpitations, vertiginous symptoms, or presyncope/syncope.  Patient did complain of fatigue.  Past medical history  significant for cardiovascular diagnoses.  Of note, records regarding patient's complete cardiovascular care unavailable at time of consult.  Patient has received care in the state of [place].  Information gathered from patient report and from notes from local cardiologist.  Patient reports a history of remote diagnostic left heart catheterization and subsequent PCI of the ramus intermedius that was done in 1997.  Stent was placed, however type unknown.  Patient with a history of sinoatrial dysfunction (sick sinus syndrome).  He underwent placement of a Medtronic PPM in 2007.  Generator was changed out at [facility] on 11/18/2015.  Myocardial perfusion imaging study performed on 06/29/2014 revealed a mildly reduced left ventricular systolic function with an EF of 46%.  There were no regional wall motion abnormalities.  Baseline EKG showed a RBBB with diffuse T wave abnormalities.  There was no evidence of stress-induced myocardial ischemia or arrhythmia; no scintigraphic evidence of scar.  Study determined to be low risk overall.  Most recent TTE was performed on 03/14/2017 revealing a low normal left ventricular systolic function with an EF of 50-55%.  There were no regional wall motion abnormalities.  Left atrium noted to be severely dilated.  There was mild to moderate mitral valve regurgitation.  There was no evidence of a significant transvalvular gradient to suggest stenosis.  Aortic root mildly dilated at 39 mm.  Patient with an atrial fibrillation diagnosis; CHA2DS2-VASc Score = 5 (age x 2, CHF, HTN, vascular disease history). His rate and rhythm are currently being maintained on oral metoprolol succinate. He is chronically anticoagulated using apixaban; reported to be compliant with therapy with no evidence or reports of GI bleeding. Blood pressure well controlled at 102/68 mmHg on currently prescribed diuretic (furosemide) and beta-blocker (metoprolol succinate) therapies.  Patient is on atorvastatin  for his HLD  diagnosis and ASCVD prevention.  He is nondiabetic.  Patient does not have an OSAH diagnosis.  Functional capacity limited somewhat by previously reported fatigue, however this was felt to be medication induced.  With that being said, patient felt to be able to achieve at least 4 METS of activity without experiencing any significant angina/anginal equivalent symptoms.  Patient was to be weaned off of his beta-blocker therapy, however in review of his medication reconciliation and pharmacy fill history, it is noted that patient has continued to take this medication.  No other changes were made to his medication regimen.  Patient to follow-up with outpatient cardiology in 6 months or sooner if needed.  Kyle Morales is scheduled for an elective XI ROBOTIC ASSISTED LEFT INGUINAL HERNIA REPAIR on 06/08/2022 with Dr. Campbell Lernerenny Rodenberg, MD. Given patient's past medical history significant for cardiovascular diagnoses, presurgical cardiac clearance was sought by the PAT team. Per cardiology, "based ACC/AHA guidelines, the patient's past medical history, and the amount of time since his last clinic visit, this patient would be at an overall ACCEPTABLE risk for the planned procedure without further cardiovascular testing or intervention at this time".  Again, this patient is on daily anticoagulation therapy.  Patient will has been instructed on recommendations from his cardiologist for holding his apixaban dose for 2 days prior to his procedure with plans to restart as soon as postoperatively respectively minimized by his primary attending surgeon.  Patient is aware that his last dose of apixaban should be on 06/05/2022.  Patient denies previous perioperative complications with anesthesia in the past. In review of the available records, it is noted that [facility] without documented complications.       05/30/2022    8:43 AM 05/15/2022   11:05 AM 02/01/2022   10:41 AM  Vitals with BMI  Height 5\' 11"  5\' 11"  5\' 11"   Weight 163 lbs 170 lbs 10 oz 159 lbs 10 oz  BMI 22.74 23.8 22.27  Systolic  115 109  Diastolic  80 75  Pulse  87 80    Providers/Specialists:   NOTE: Primary physician provider listed below. Patient may have been seen by APP or partner within same practice.   PROVIDER ROLE / SPECIALTY LAST Barton FannyV  Rodenberg, Denny, MD General Surgery (Surgeon) 05/15/2022  Katrinka BlazingHalpert, Karen D, MD Primary Care Provider 05/01/2022  Julien NordmannGollan, Timothy, MD Cardiology 08/07/2021  Sherryl MangesKlein, Steven, MD Electrophysiology 10/03/2021   Allergies:  Amoxicillin and Bactrim [sulfamethoxazole-trimethoprim]  Current Home Medications:   No current facility-administered medications for this encounter.    acetaminophen (TYLENOL) 500 MG tablet   apixaban (ELIQUIS) 5 MG TABS tablet   [employer] ZINC 50 MG TABS   finasteride (PROSCAR) 5 MG tablet   fluticasone (FLONASE) 50 MCG/ACT nasal spray   furosemide (LASIX) 40 MG tablet   ipratropium (ATROVENT HFA) 17 MCG/ACT inhaler   lovastatin (MEVACOR) 20 MG tablet   metoprolol succinate (TOPROL-XL) 50 MG 24 hr tablet   nitroGLYCERIN (NITROSTAT) 0.4 MG SL tablet   omeprazole (PRILOSEC) 20 MG capsule   potassium chloride SA (KLOR-CON) 20 MEQ tablet   sertraline (ZOLOFT) 50 MG tablet   tamsulosin (FLOMAX) 0.4 MG CAPS capsule   History:   Past Medical History:  Diagnosis Date   Aortic atherosclerosis (HCC)    Aortic root dilation (HCC)    a.) TTE [id number]: Ao root measured 39 mm.   BPH (benign prostatic hyperplasia)  CHF (congestive heart failure) (HCC)    Coronary artery disease    a.) remote PCI of the RI in 1997; stent (unknown type) placed.   Depression    Diverticulosis    Eczema    GERD (gastroesophageal reflux disease)    Hyperlipidemia    Hypertension    Inguinal hernia, left    Long term current use of anticoagulant    a.) apixaban    Nephrolithiasis    NSVT (nonsustained ventricular tachycardia) (HCC)    Osteoarthrosis    Paroxysmal A-fib (HCC)    a.) CHA2DS2VASc = 5 (age x2, CHF, HTN, vascular disease history);  b.) rate/rhythm maintained on oral metoprolol succinate; chronically anticoagulated with apixaban   Pharyngeal dysphagia    Presence of permanent cardiac pacemaker 2007   a.) Medtronic device placed in [place] in 2007; b.) generator changed at [facility] on 11/18/2015   RBBB    SSS (sick sinus syndrome) (HCC) 2007   a.) s/p PPM placement in [place] in 2007   Past Surgical History:  Procedure Laterality Date   BLEPHAROPLASTY Bilateral    CARDIAC CATHETERIZATION  09/25/1995   CARDIAC CATHETERIZATION  09/24/1998   COLONOSCOPY  09/24/2010   CORONARY ANGIOPLASTY  09/25/1995   ramus   ESOPHAGOGASTRODUODENOSCOPY N/A 02/11/2017   Procedure: ESOPHAGOGASTRODUODENOSCOPY (EGD);  Surgeon: Christena Deem, MD;  [facility]: [facility] ENDOSCOPY;  Service: Endoscopy;  Laterality: N/A;   INGUINAL HERNIA REPAIR Left 1977   INSERT / REPLACE / REMOVE PACEMAKER  12/23/2005   Medtronic [id number] - placed in [place]   MANDIBLE SURGERY  1966   PACEMAKER GENERATOR CHANGE N/A 11/18/2015   ROBOTIC ASSISTED LAPAROSCOPIC CHOLECYSTECTOMY  01/12/2022   TONSILLECTOMY     TOTAL HIP ARTHROPLASTY Right 2022   TOTAL SHOULDER REPLACEMENT     bilateral approx 2020 and 2021   Family History  Problem Relation Age of Onset   Heart disease Mother    Hypertension Mother    Hyperlipidemia Mother    Heart attack Father    Heart disease Brother    Social History   Tobacco Use   Smoking status: Never   Smokeless tobacco: Never  Vaping Use   Vaping Use: Never used  Substance Use Topics   Alcohol use: No    Comment: social drinker; 1 bottle of beer every 2 weeks.   Drug use: No    Pertinent Clinical Results:  LABS: Labs reviewed: Acceptable for surgery.  No visits with results within 3 Day(s) from this visit.  Latest known visit with  results is:  Hospital Outpatient Visit on 05/31/2022  Component Date Value Ref Range Status   WBC 05/31/2022 5.6  4.0 - 10.5 K/uL Final   RBC 05/31/2022 4.36  4.22 - 5.81 MIL/uL Final   Hemoglobin 05/31/2022 14.2  13.0 - 17.0 g/dL Final   HCT 12/75/1700 42.8  39.0 - 52.0 % Final   MCV 05/31/2022 98.2  80.0 - 100.0 fL Final   MCH 05/31/2022 32.6  26.0 - 34.0 pg Final   MCHC 05/31/2022 33.2  30.0 - 36.0 g/dL Final   RDW 17/49/4496 12.9  11.5 - 15.5 % Final   Platelets 05/31/2022 156  150 - 400 K/uL Final   nRBC 05/31/2022 0.0  0.0 - 0.2 % Final   Performed at [facility], [address] [place], [place] [address]   Sodium 05/31/2022 141  135 - 145 mmol/L Final   Potassium 05/31/2022 3.5  3.5 - 5.1 mmol/L Final   Chloride 05/31/2022 105  98 -  111 mmol/L Final   CO2 05/31/2022 28  22 - 32 mmol/L Final   Glucose, Bld 05/31/2022 123 (H)  70 - 99 mg/dL Final   Glucose reference range applies only to samples taken after fasting for at least 8 hours.   BUN 05/31/2022 14  8 - 23 mg/dL Final   Creatinine, Ser 05/31/2022 0.94  0.61 - 1.24 mg/dL Final   Calcium 16/06/9603 9.1  8.9 - 10.3 mg/dL Final   Total Protein 54/05/8118 6.9  6.5 - 8.1 g/dL Final   Albumin 14/78/2956 4.1  3.5 - 5.0 g/dL Final   AST 21/30/8657 20  15 - 41 U/L Final   ALT 05/31/2022 13  0 - 44 U/L Final   Alkaline Phosphatase 05/31/2022 59  38 - 126 U/L Final   Total Bilirubin 05/31/2022 1.4 (H)  0.3 - 1.2 mg/dL Final   GFR, Estimated 05/31/2022 >60  >60 mL/min Final   Comment: (NOTE) Calculated using the CKD-EPI Creatinine Equation (2021)    Anion gap 05/31/2022 8  5 - 15 Final   Performed at [facility], [address] [place], [place] [address]    ECG: Date: 01/10/2022 Time ECG obtained: 2037 PM Rate: 71 bpm Rhythm: atrial fibrillation Axis (leads I and aVF): Normal Intervals: QRS 105 ms. QTc 487 ms. ST segment and T wave changes: Nonspecific anterior T wave abnormalities.  Abnormal R wave  progression.  Prolonged QT interval.   Comparison: Previously obtained tracing on 10/03/2021 showed atrial paced rhythm with prolonged AV conduction; IRBBB; and anterolateral T wave abnormalities.   IMAGING / PROCEDURES: US ABDOMEN LIMITED RUQ (LIVER/GB) performed on 01/11/2022 Cholelithiasis, with additional sonographic findings worrisome for acute cholecystitis and with 1.3 cm stone impacted in the gallbladder neck. Cirrhotic liver configuration with stable cysts. No suspicious liver abnormality. No portal vein dilatation or flow reversal. Upper-normal common bile duct caliber, but similar to prior CTs. Laboratory and clinical correlation suggested.  CT CHEST ABDOMEN PELVIS W CONTRAST performed on 01/10/2022 Cholelithiasis, with pericholecystic fat stranding compatible with acute cholecystitis. If gallbladder pathology is suspected, correlation with ultrasound may be useful. Subacute posterior right tenth rib fracture, with mild callus formation. There also chronic appearing fractures in the right posterior ninth and tenth ribs which may reflect nonunions. No acute bony abnormalities. Large left inguinal hernia containing a portion of the sigmoid colon. No incarceration or obstruction. Nonobstructing bilateral renal calculi. Small area of loculated pleural fluid within the right major fissure, corresponding to the x-ray finding. Sigmoid diverticulosis without diverticulitis. Multiple benign hepatic and renal cysts. No specific imaging follow-up is required. Enlarged prostate, with evidence of chronic bladder outlet obstruction. Aortic atherosclerosis Marked left atrial dilatation  TRANSTHORACIC ECHOCARDIOGRAM performed on 12/23/2020 Technically difficult study Normal left ventricular systolic function with an EF of 55-60% Right ventricle not well visualized, but probably normal in size with normal systolic function Trivial PR, MR, and TR No AR Normal gradients; no valvular  stenosis  MYOCARDIAL PERFUSION IMAGING STUDY (LEXISCAN) performed on 06/29/2014 Mildly reduced left ventricular systolic function with an EF of 46% Normal myocardial thickening and wall motion Baseline EKG with RBBB and diffuse T wave abnormalities Left ventricular cavity size normal SPECT images demonstrate homogenous tracer distribution throughout the myocardium No evidence of stress-induced myocardial ischemia or arrhythmia Overall low risk study  Impression and Plan:  Kyle Morales has been referred for pre-anesthesia review and clearance prior to him undergoing the planned anesthetic and procedural courses. Available labs, pertinent testing, and imaging results were  personally reviewed by me. This patient has been appropriately cleared by cardiology with an overall ACCEPTABLE risk of significant perioperative cardiovascular complications. Completed perioperative prescription for cardiac device management documentation completed by primary cardiology team and placed on patient's chart for review by the surgical/anesthetic team on the day of his procedure.    Based on clinical review performed today (06/05/22), barring any significant acute changes in the patient's overall condition, it is anticipated that he will be able to proceed with the planned surgical intervention. Any acute changes in clinical condition may necessitate his procedure being postponed and/or cancelled. Patient will meet with anesthesia team (MD and/or CRNA) on the day of his procedure for preoperative evaluation/assessment. Questions regarding anesthetic course will be fielded at that time.   Pre-surgical instructions were reviewed with the patient during his PAT appointment and questions were fielded by PAT clinical staff. Patient was advised that if any questions or concerns arise prior to his procedure then he should return a call to PAT and/or his surgeon's office to discuss.  Quentin Mulling, MSN, APRN, FNP-C, CEN [facility]  Peri-operative Services Nurse Practitioner Phone: [phone] Fax: [phone] 06/05/22 11:12 AM  NOTE: This note has been prepared using Dragon dictation software. Despite my best ability to proofread, there is always the potential that unintentional transcriptional errors may still occur from this process.

## 2022-06-05 NOTE — Progress Notes (Signed)
Remote pacemaker transmission.   

## 2022-06-07 MED ORDER — BUPIVACAINE LIPOSOME 1.3 % IJ SUSP: 20.0000 mL | Freq: Once | INTRAMUSCULAR | Status: DC

## 2022-06-07 MED ORDER — ACETAMINOPHEN 500 MG PO TABS: 1000.0000 mg | ORAL_TABLET | ORAL | Status: AC

## 2022-06-07 MED ORDER — ORAL CARE MOUTH RINSE: 15.0000 mL | Freq: Once | OROMUCOSAL | Status: AC

## 2022-06-07 MED ORDER — GABAPENTIN 300 MG PO CAPS: 300.0000 mg | ORAL_CAPSULE | ORAL | Status: AC

## 2022-06-07 MED ORDER — CHLORHEXIDINE GLUCONATE CLOTH 2 % EX PADS: 6.0000 | MEDICATED_PAD | Freq: Once | CUTANEOUS | Status: DC

## 2022-06-07 MED ORDER — CHLORHEXIDINE GLUCONATE 0.12 % MT SOLN: 15.0000 mL | Freq: Once | OROMUCOSAL | Status: AC

## 2022-06-07 MED ORDER — CIPROFLOXACIN IN D5W 400 MG/200ML IV SOLN
400.0000 mg | INTRAVENOUS | Status: AC
  Administered 2022-06-08: 400 mg via INTRAVENOUS

## 2022-06-08 ENCOUNTER — Ambulatory Visit
Admission: RE | Admit: 2022-06-08 | Discharge: 2022-06-08 | Disposition: A | Discharge status: Home / Self Care | Payer: Medicare Other | Attending: Surgery | Admitting: Surgery

## 2022-06-08 ENCOUNTER — Other Ambulatory Visit: Payer: Self-pay

## 2022-06-08 ENCOUNTER — Encounter: Payer: Self-pay | Admitting: Surgery

## 2022-06-08 ENCOUNTER — Ambulatory Visit: Payer: Medicare Other | Admitting: Urgent Care

## 2022-06-08 ENCOUNTER — Encounter
Admission: RE | Disposition: A | Discharge status: Home / Self Care | Payer: Self-pay | Source: Home / Self Care | Attending: Surgery

## 2022-06-08 DIAGNOSIS — I480 Paroxysmal atrial fibrillation: Secondary | ICD-10-CM | POA: Insufficient documentation

## 2022-06-08 DIAGNOSIS — Z950 Presence of cardiac pacemaker: Secondary | ICD-10-CM | POA: Diagnosis not present

## 2022-06-08 DIAGNOSIS — I4510 Unspecified right bundle-branch block: Secondary | ICD-10-CM | POA: Insufficient documentation

## 2022-06-08 DIAGNOSIS — I25118 Atherosclerotic heart disease of native coronary artery with other forms of angina pectoris: Secondary | ICD-10-CM

## 2022-06-08 DIAGNOSIS — I2510 Atherosclerotic heart disease of native coronary artery without angina pectoris: Secondary | ICD-10-CM | POA: Diagnosis not present

## 2022-06-08 DIAGNOSIS — I110 Hypertensive heart disease with heart failure: Secondary | ICD-10-CM | POA: Diagnosis not present

## 2022-06-08 DIAGNOSIS — Z955 Presence of coronary angioplasty implant and graft: Secondary | ICD-10-CM | POA: Insufficient documentation

## 2022-06-08 DIAGNOSIS — I509 Heart failure, unspecified: Secondary | ICD-10-CM | POA: Insufficient documentation

## 2022-06-08 DIAGNOSIS — N400 Enlarged prostate without lower urinary tract symptoms: Secondary | ICD-10-CM | POA: Insufficient documentation

## 2022-06-08 DIAGNOSIS — R1310 Dysphagia, unspecified: Secondary | ICD-10-CM | POA: Diagnosis not present

## 2022-06-08 DIAGNOSIS — K4091 Unilateral inguinal hernia, without obstruction or gangrene, recurrent: Secondary | ICD-10-CM

## 2022-06-08 DIAGNOSIS — K219 Gastro-esophageal reflux disease without esophagitis: Secondary | ICD-10-CM | POA: Diagnosis not present

## 2022-06-08 DIAGNOSIS — E785 Hyperlipidemia, unspecified: Secondary | ICD-10-CM | POA: Insufficient documentation

## 2022-06-08 DIAGNOSIS — K4090 Unilateral inguinal hernia, without obstruction or gangrene, not specified as recurrent: Secondary | ICD-10-CM

## 2022-06-08 HISTORY — DX: Unilateral inguinal hernia, without obstruction or gangrene, not specified as recurrent: K40.90

## 2022-06-08 HISTORY — DX: Diverticulosis of intestine, part unspecified, without perforation or abscess without bleeding: K57.90

## 2022-06-08 HISTORY — DX: Unspecified right bundle-branch block: I45.10

## 2022-06-08 HISTORY — DX: Thoracic aortic ectasia: I77.810

## 2022-06-08 HISTORY — DX: Calculus of kidney: N20.0

## 2022-06-08 HISTORY — DX: Dysphagia, pharyngeal phase: R13.13

## 2022-06-08 HISTORY — DX: Benign prostatic hyperplasia without lower urinary tract symptoms: N40.0

## 2022-06-08 HISTORY — DX: Other ventricular tachycardia: I47.29

## 2022-06-08 HISTORY — DX: Long term (current) use of anticoagulants: Z79.01

## 2022-06-08 HISTORY — DX: Atherosclerosis of aorta: I70.0

## 2022-06-08 HISTORY — PX: INSERTION OF MESH: SHX5868

## 2022-06-08 LAB — POCT I-STAT, CHEM 8
BUN: <3 mg/dL — ABNORMAL LOW (ref 8–23)
Calcium, Ion: 1.19 mmol/L (ref 1.15–1.40)
Chloride: 99 mmol/L (ref 98–111)
Creatinine, Ser: 0.9 mg/dL (ref 0.61–1.24)
Glucose, Bld: 98 mg/dL (ref 70–99)
HCT: 41 % (ref 39.0–52.0)
Hemoglobin: 13.9 g/dL (ref 13.0–17.0)
Potassium: 3.4 mmol/L — ABNORMAL LOW (ref 3.5–5.1)
Sodium: 142 mmol/L (ref 135–145)
TCO2: 28 mmol/L (ref 22–32)

## 2022-06-08 SURGERY — HERNIORRHAPHY, INGUINAL, ROBOT-ASSISTED, LAPAROSCOPIC
Anesthesia: General | Site: Inguinal | Laterality: Left

## 2022-06-08 MED ORDER — HYDROCODONE-ACETAMINOPHEN 5-325 MG PO TABS: 1.0000 | ORAL_TABLET | Freq: Four times a day (QID) | ORAL | 0 refills | Status: DC | PRN

## 2022-06-08 MED ORDER — SUGAMMADEX SODIUM 200 MG/2ML IV SOLN
INTRAVENOUS | Status: DC | PRN
  Administered 2022-06-08: 200 mg via INTRAVENOUS

## 2022-06-08 MED ORDER — ONDANSETRON HCL 4 MG/2ML IJ SOLN
INTRAMUSCULAR | Status: DC | PRN
  Administered 2022-06-08: 4 mg via INTRAVENOUS

## 2022-06-08 MED ORDER — CIPROFLOXACIN IN D5W 400 MG/200ML IV SOLN
INTRAVENOUS | Status: AC
  Filled 2022-06-08: qty 200

## 2022-06-08 MED ORDER — GABAPENTIN 300 MG PO CAPS
ORAL_CAPSULE | ORAL | Status: AC
  Administered 2022-06-08: 300 mg via ORAL
  Filled 2022-06-08: qty 1

## 2022-06-08 MED ORDER — POLYETHYLENE GLYCOL 3350 17 G PO PACK: 17.0000 g | PACK | Freq: Every day | ORAL | 0 refills | Status: DC

## 2022-06-08 MED ORDER — DEXMEDETOMIDINE HCL IN NACL 200 MCG/50ML IV SOLN
INTRAVENOUS | Status: DC | PRN
  Administered 2022-06-08 (×5): 4 ug via INTRAVENOUS

## 2022-06-08 MED ORDER — BUPIVACAINE-EPINEPHRINE (PF) 0.25% -1:200000 IJ SOLN
INTRAMUSCULAR | Status: AC
  Filled 2022-06-08: qty 30

## 2022-06-08 MED ORDER — OXYCODONE HCL 5 MG PO TABS
5.0000 mg | ORAL_TABLET | Freq: Once | ORAL | Status: AC | PRN
  Administered 2022-06-08: 5 mg via ORAL

## 2022-06-08 MED ORDER — FENTANYL CITRATE (PF) 100 MCG/2ML IJ SOLN: 25.0000 ug | INTRAMUSCULAR | Status: DC | PRN

## 2022-06-08 MED ORDER — LACTATED RINGERS IV SOLN: INTRAVENOUS | Status: DC

## 2022-06-08 MED ORDER — OXYCODONE HCL 5 MG/5ML PO SOLN: 5.0000 mg | Freq: Once | ORAL | Status: AC | PRN

## 2022-06-08 MED ORDER — FENTANYL CITRATE (PF) 100 MCG/2ML IJ SOLN
INTRAMUSCULAR | Status: AC
  Filled 2022-06-08: qty 2

## 2022-06-08 MED ORDER — CHLORHEXIDINE GLUCONATE 0.12 % MT SOLN
OROMUCOSAL | Status: AC
  Administered 2022-06-08: 15 mL via OROMUCOSAL
  Filled 2022-06-08: qty 15

## 2022-06-08 MED ORDER — DEXAMETHASONE SODIUM PHOSPHATE 10 MG/ML IJ SOLN
INTRAMUSCULAR | Status: DC | PRN
  Administered 2022-06-08: 10 mg via INTRAVENOUS

## 2022-06-08 MED ORDER — LIDOCAINE HCL (CARDIAC) PF 100 MG/5ML IV SOSY
PREFILLED_SYRINGE | INTRAVENOUS | Status: DC | PRN
  Administered 2022-06-08: 50 mg via INTRAVENOUS

## 2022-06-08 MED ORDER — OXYCODONE HCL 5 MG PO TABS
ORAL_TABLET | ORAL | Status: AC
  Filled 2022-06-08: qty 1

## 2022-06-08 MED ORDER — PROPOFOL 10 MG/ML IV BOLUS
INTRAVENOUS | Status: AC
  Filled 2022-06-08: qty 20

## 2022-06-08 MED ORDER — BUPIVACAINE-EPINEPHRINE (PF) 0.25% -1:200000 IJ SOLN
INTRAMUSCULAR | Status: DC | PRN
  Administered 2022-06-08: 27 mL

## 2022-06-08 MED ORDER — ROCURONIUM BROMIDE 100 MG/10ML IV SOLN
INTRAVENOUS | Status: DC | PRN
  Administered 2022-06-08: 50 mg via INTRAVENOUS

## 2022-06-08 MED ORDER — ACETAMINOPHEN 500 MG PO TABS
ORAL_TABLET | ORAL | Status: AC
  Administered 2022-06-08: 1000 mg via ORAL
  Filled 2022-06-08: qty 2

## 2022-06-08 MED ORDER — PHENYLEPHRINE 80 MCG/ML (10ML) SYRINGE FOR IV PUSH (FOR BLOOD PRESSURE SUPPORT)
PREFILLED_SYRINGE | INTRAVENOUS | Status: DC | PRN
  Administered 2022-06-08 (×4): 80 ug via INTRAVENOUS
  Administered 2022-06-08: 160 ug via INTRAVENOUS

## 2022-06-08 MED ORDER — FENTANYL CITRATE (PF) 100 MCG/2ML IJ SOLN
INTRAMUSCULAR | Status: DC | PRN
  Administered 2022-06-08 (×2): 50 ug via INTRAVENOUS

## 2022-06-08 MED ORDER — PROPOFOL 10 MG/ML IV BOLUS
INTRAVENOUS | Status: DC | PRN
  Administered 2022-06-08: 120 mg via INTRAVENOUS

## 2022-06-08 SURGICAL SUPPLY — 37 items
ADH SKN CLS APL DERMABOND .7 (GAUZE/BANDAGES/DRESSINGS) ×2
BLADE CLIPPER SURG (BLADE) ×2 IMPLANT
COVER TIP SHEARS 8 DVNC (MISCELLANEOUS) ×2 IMPLANT
COVER TIP SHEARS 8MM DA VINCI (MISCELLANEOUS) ×2
COVER WAND RF STERILE (DRAPES) ×2 IMPLANT
DERMABOND ADVANCED .7 DNX12 (GAUZE/BANDAGES/DRESSINGS) ×2 IMPLANT
DRAPE ARM DVNC X/XI (DISPOSABLE) ×6 IMPLANT
DRAPE COLUMN DVNC XI (DISPOSABLE) ×2 IMPLANT
DRAPE DA VINCI XI ARM (DISPOSABLE) ×6
DRAPE DA VINCI XI COLUMN (DISPOSABLE) ×2
ELECT REM PT RETURN 9FT ADLT (ELECTROSURGICAL) ×2
ELECTRODE REM PT RTRN 9FT ADLT (ELECTROSURGICAL) ×2 IMPLANT
GLOVE ORTHO TXT STRL SZ7.5 (GLOVE) ×6 IMPLANT
GOWN STRL REUS W/ TWL LRG LVL3 (GOWN DISPOSABLE) ×2 IMPLANT
GOWN STRL REUS W/ TWL XL LVL3 (GOWN DISPOSABLE) ×4 IMPLANT
GOWN STRL REUS W/TWL LRG LVL3 (GOWN DISPOSABLE) ×2
GOWN STRL REUS W/TWL XL LVL3 (GOWN DISPOSABLE) ×4
KIT PINK PAD W/HEAD ARE REST (MISCELLANEOUS) ×2
KIT PINK PAD W/HEAD ARM REST (MISCELLANEOUS) ×2 IMPLANT
LABEL OR SOLS (LABEL) ×2 IMPLANT
MANIFOLD NEPTUNE II (INSTRUMENTS) ×2 IMPLANT
MESH 3DMAX LIGHT 4.8X6.7 LT XL (Mesh General) IMPLANT
NDL INSUFFLATION 14GA 120MM (NEEDLE) IMPLANT
NEEDLE HYPO 22GX1.5 SAFETY (NEEDLE) ×2 IMPLANT
NEEDLE INSUFFLATION 14GA 120MM (NEEDLE) ×2 IMPLANT
PACK LAP CHOLECYSTECTOMY (MISCELLANEOUS) ×2 IMPLANT
SEAL CANN UNIV 5-8 DVNC XI (MISCELLANEOUS) ×6 IMPLANT
SEAL XI 5MM-8MM UNIVERSAL (MISCELLANEOUS) ×6
SET TUBE SMOKE EVAC HIGH FLOW (TUBING) ×2 IMPLANT
SOLUTION ELECTROLUBE (MISCELLANEOUS) ×2 IMPLANT
SUT MNCRL 4-0 (SUTURE) ×2
SUT MNCRL 4-0 27XMFL (SUTURE) ×2
SUT VIC AB 0 CT2 27 (SUTURE) ×2 IMPLANT
SUT VIC AB 2-0 RB1 27 (SUTURE) ×2 IMPLANT
SUTURE MNCRL 4-0 27XMF (SUTURE) ×2 IMPLANT
TRAP FLUID SMOKE EVACUATOR (MISCELLANEOUS) ×2 IMPLANT
WATER STERILE IRR 500ML POUR (IV SOLUTION) ×2 IMPLANT

## 2022-06-08 NOTE — Interval H&P Note (Signed)
History and Physical Interval Note:  06/08/2022 9:45 AM  Kyle Morales  has presented today for surgery, with the diagnosis of left inguinal hernia.  The various methods of treatment have been discussed with the patient and family. After consideration of risks, benefits and other options for treatment, the patient has consented to  Procedure(s): XI ROBOTIC ASSISTED INGUINAL HERNIA (Left) as a surgical intervention.  The patient's history has been reviewed, patient examined, no change in status, stable for surgery.  I have reviewed the patient's chart and labs.  Questions were answered to the patient's satisfaction.   The left side is marked.   Campbell Lerner

## 2022-06-08 NOTE — Anesthesia Preprocedure Evaluation (Signed)
Anesthesia Evaluation  Patient identified by MRN, date of birth, ID band Patient awake    Reviewed: Allergy & Precautions, NPO status , Patient's Chart, lab work & pertinent test results  History of Anesthesia Complications Negative for: history of anesthetic complications  Airway Mallampati: III  TM Distance: >3 FB Neck ROM: full    Dental  (+) Chipped, Poor Dentition, Missing   Pulmonary neg pulmonary ROS, neg shortness of breath,    Pulmonary exam normal        Cardiovascular Exercise Tolerance: Good hypertension, + CAD, + Cardiac Stents and +CHF  Normal cardiovascular exam+ dysrhythmias + pacemaker      Neuro/Psych  Neuromuscular disease negative psych ROS   GI/Hepatic Neg liver ROS, GERD  Controlled,  Endo/Other  negative endocrine ROS  Renal/GU Renal disease     Musculoskeletal   Abdominal   Peds  Hematology negative hematology ROS (+)   Anesthesia Other Findings Past Medical History: No date: Aortic atherosclerosis (HCC) No date: Aortic root dilation (HCC)     Comment:  a.) TTE [id number]: Ao root measured 39 mm. No date: BPH (benign prostatic hyperplasia) No date: CHF (congestive heart failure) (HCC) No date: Coronary artery disease     Comment:  a.) remote PCI of the RI in 1997; stent (unknown type)               placed. No date: Depression No date: Diverticulosis No date: Eczema No date: GERD (gastroesophageal reflux disease) No date: Hyperlipidemia No date: Hypertension No date: Inguinal hernia, left No date: Long term current use of anticoagulant     Comment:  a.) apixaban No date: Nephrolithiasis No date: NSVT (nonsustained ventricular tachycardia) (HCC) No date: Osteoarthrosis No date: Paroxysmal A-fib [facility])     Comment:  a.) CHA2DS2VASc = 5 (age x2, CHF, HTN, vascular disease               history);  b.) rate/rhythm maintained on oral metoprolol               succinate; chronically  anticoagulated with apixaban No date: Pharyngeal dysphagia 2007: Presence of permanent cardiac pacemaker     Comment:  a.) Medtronic device placed in [place] in 2007; b.)               generator changed at [facility] on 11/18/2015 No date: RBBB 2007: SSS (sick sinus syndrome) (HCC)     Comment:  a.) s/p PPM placement in [place] in 2007  Past Surgical History: No date: BLEPHAROPLASTY; Bilateral 09/25/1995: CARDIAC CATHETERIZATION 09/24/1998: CARDIAC CATHETERIZATION 09/24/2010: COLONOSCOPY 09/25/1995: CORONARY ANGIOPLASTY     Comment:  ramus 02/11/2017: ESOPHAGOGASTRODUODENOSCOPY; N/A     Comment:  Procedure: ESOPHAGOGASTRODUODENOSCOPY (EGD);  Surgeon:               Christena Deem, MD;  Location: [facility] ENDOSCOPY;                Service: Endoscopy;  Laterality: N/A; 1977: INGUINAL HERNIA REPAIR; Left 12/23/2005: INSERT / REPLACE / REMOVE PACEMAKER     Comment:  Medtronic [id number] - placed in [place] 1966: MANDIBLE SURGERY 11/18/2015: PACEMAKER GENERATOR CHANGE; N/A 01/12/2022: ROBOTIC ASSISTED LAPAROSCOPIC CHOLECYSTECTOMY No date: TONSILLECTOMY 2022: TOTAL HIP ARTHROPLASTY; Right No date: TOTAL SHOULDER REPLACEMENT     Comment:  bilateral approx 2020 and 2021  BMI    Body Mass Index: 22.73 kg/m      Reproductive/Obstetrics negative OB ROS  Anesthesia Physical Anesthesia Plan  ASA: 4  Anesthesia Plan: General ETT   Post-op Pain Management:    Induction: Intravenous  PONV Risk Score and Plan: Ondansetron, Dexamethasone, Midazolam and Treatment may vary due to age or medical condition  Airway Management Planned: Oral ETT  Additional Equipment:   Intra-op Plan:   Post-operative Plan: Extubation in OR  Informed Consent: I have reviewed the patients History and Physical, chart, labs and discussed the procedure including the risks, benefits and alternatives for the proposed anesthesia with the patient or authorized  representative who has indicated his/her understanding and acceptance.     Dental Advisory Given  Plan Discussed with: Anesthesiologist, CRNA and Surgeon  Anesthesia Plan Comments: (Patient consented for risks of anesthesia including but not limited to:  - adverse reactions to medications - damage to eyes, teeth, lips or other oral mucosa - nerve damage due to positioning  - sore throat or hoarseness - Damage to heart, brain, nerves, lungs, other parts of body or loss of life  Patient voiced understanding.)        Anesthesia Quick Evaluation

## 2022-06-08 NOTE — Discharge Instructions (Signed)

## 2022-06-08 NOTE — Anesthesia Procedure Notes (Signed)
Procedure Name: Intubation Date/Time: 06/08/2022 10:26 AM  Performed by: Cheral Bay, CRNAPre-anesthesia Checklist: Patient identified, Emergency Drugs available, Suction available and Patient being monitored Patient Re-evaluated:Patient Re-evaluated prior to induction Oxygen Delivery Method: Circle system utilized Preoxygenation: Pre-oxygenation with 100% oxygen Induction Type: IV induction Ventilation: Mask ventilation without difficulty Laryngoscope Size: McGraph and 4 Grade View: Grade I Tube type: Oral Tube size: 7.0 mm Number of attempts: 1 Airway Equipment and Method: Stylet Placement Confirmation: ETT inserted through vocal cords under direct vision, positive ETCO2 and breath sounds checked- equal and bilateral Secured at: 21 cm Tube secured with: Tape Dental Injury: Teeth and Oropharynx as per pre-operative assessment

## 2022-06-08 NOTE — Anesthesia Postprocedure Evaluation (Signed)
Anesthesia Post Note  Patient: Kyle Morales  Procedure(s) Performed: XI ROBOTIC ASSISTED INGUINAL HERNIA (Left: Abdomen) INSERTION OF MESH (Left: Inguinal)  Patient location during evaluation: PACU Anesthesia Type: General Level of consciousness: awake and alert Pain management: pain level controlled Vital Signs Assessment: post-procedure vital signs reviewed and stable Respiratory status: spontaneous breathing, nonlabored ventilation, respiratory function stable and patient connected to nasal cannula oxygen Cardiovascular status: blood pressure returned to baseline and stable Postop Assessment: no apparent nausea or vomiting Anesthetic complications: no   No notable events documented.   Last Vitals:  Vitals:   06/08/22 1231 06/08/22 1242  BP: 102/73 108/73  Pulse: 70 69  Resp: 14 16  Temp: (!) 36.2 C (!) 36.1 C  SpO2: 95% 95%    Last Pain:  Vitals:   06/08/22 1242  TempSrc: Temporal  PainSc:                  Cleda Mccreedy 

## 2022-06-08 NOTE — Transfer of Care (Signed)
Immediate Anesthesia Transfer of Care Note  Patient: Kyle Morales  Procedure(s) Performed: XI ROBOTIC ASSISTED INGUINAL HERNIA (Left: Abdomen) INSERTION OF MESH (Left: Inguinal)  Patient Location: PACU  Anesthesia Type:General  Level of Consciousness: awake  Airway & Oxygen Therapy: Patient Spontanous Breathing and Patient connected to face mask oxygen  Post-op Assessment: Report given to RN and Post -op Vital signs reviewed and stable  Post vital signs: Reviewed and stable  Last Vitals:  Vitals Value Taken Time  BP 112/76 06/08/22 1145  Temp    Pulse 71 06/08/22 1148  Resp 18 06/08/22 1148  SpO2 97 % 06/08/22 1148  Vitals shown include unvalidated device data.  Last Pain:  Vitals:   06/08/22 0834  TempSrc: Temporal  PainSc: 0-No pain         Complications: No notable events documented.

## 2022-06-08 NOTE — Op Note (Addendum)
Robotic assisted Laparoscopic Transabdominal left recurrent inguinal Hernia Repair with Mesh       Pre-operative Diagnosis: Left recurrent inguinal Hernia   Post-operative Diagnosis: Same   Procedure: Robotic assisted Laparoscopic  repair of left recurrent inguinal hernia(s)   Surgeon: Campbell Lerner, M.D., FACS   Anesthesia: GETA   Findings: Large left inguinal hernia, no evidence of right sided hernia.         Procedure Details  The patient was seen again in the Holding Room. The benefits, complications, treatment options, and expected outcomes were discussed with the patient. The risks of bleeding, infection, recurrence of symptoms, failure to resolve symptoms, recurrence of hernia, ischemic orchitis, chronic pain syndrome or neuroma, were reviewed again. The likelihood of improving the patient's symptoms with return to their baseline status is good.  The patient and/or family concurred with the proposed plan, giving informed consent.  The patient was taken to Operating Room, identified  and the procedure verified as Laparoscopic Inguinal Hernia Repair. Laterality confirmed.  A Time Out was held and the above information confirmed.   Prior to the induction of general anesthesia, antibiotic prophylaxis was administered. VTE prophylaxis was in place. General endotracheal anesthesia was then administered and tolerated well. After the induction, the abdomen was prepped with Chloraprep and draped in the sterile fashion. The patient was positioned in the supine position.   After local infiltration of quarter percent Marcaine with epinephrine, stab incision was made left upper quadrant.  On the left at Palmer's point, the Veress needle is passed with sensation of the layers to penetrate the abdominal wall and into the peritoneum.  Saline drop test is confirmed peritoneal placement.  Insufflation is initiated with carbon dioxide to pressures of 15 mmHg. An 8.5 mm port is placed to the left off of  the midline, with blunt tipped trocar.  Pneumoperitoneum maintained w/o HD changes using the AirSeal to pressures of 15 mm Hg with CO2. No evidence of bowel injuries.  Two 8.5 mm ports placed under direct vision in each upper quadrant. The laparoscopy revealed a large left indirect defect(s).   The robot was brought ot the table and docked in the standard fashion, no collision between arms was observed. Instruments were kept under direct view at all times. For left inguinal hernia repair,  I developed a peritoneal flap, leaving the sigmoid colon tethered to the sac. The sac was reduced and dissected free from adjacent structures. We preserved the vas and the vessels, and visualized them to their convergence and beyond in the retroperitoneum. Once dissection was completed a extra-large left sided BARD 3D Light mesh was placed and secured at three points with interrupted 0 Vicryl to the pubic tubercle and anteriorly. There was good coverage of the direct, indirect and femoral spaces.  Second look revealed no complications or injuries.  The flap was then closed with 2-0 V-lock suture.  Peritoneal closure without defects.  Under direct visualization I placed the Veress needle into the preperitoneal space, the Veress' valve was released allowing extraperitoneal CO2 to escape.  Once assuring that hemostasis was adequate, all needles/sponges removed, and the robot was undocked.  The ports were removed, the abdomen desulflated.  4-0 subcuticular Monocryl was used at all skin edges. Dermabond was placed.  Patient tolerated the procedure well. There were no complications. He was taken to the recovery room in stable condition.           Campbell Lerner, M.D., FACS 06/08/2022, 12:18 PM

## 2022-06-11 ENCOUNTER — Encounter: Payer: Self-pay | Admitting: Surgery

## 2022-06-14 ENCOUNTER — Telehealth: Payer: Self-pay | Admitting: Internal Medicine

## 2022-06-14 NOTE — Telephone Encounter (Signed)
   Pre-operative Risk Assessment    Patient Name: Kyle Morales  DOB: 05-26-1936 MRN: [id number]      Request for Surgical Clearance    Procedure:   SINUS SURGERY  Date of Surgery:  Clearance TBD                               Surgeon:  DR Lawson Fiscal Surgeon's Group or Practice Name:  [facility] DEPT OF OTOLARYNGOLOGY/HEAD AND NECK SURGERY Phone number:  [phone] Fax number:  [phone]  Type of Clearance Requested:   - Pharmacy:  Hold Apixaban (Eliquis)   Not Indicated   Additional requests/questions:    [name]   06/14/2022, 4:03 PM

## 2022-06-18 NOTE — Telephone Encounter (Signed)
   Patient Name: Kyle Morales  DOB: Dec 31, 1935 MRN: [id number]  Primary Cardiologist: Ida Rogue, MD  Clinical pharmacists have reviewed the patient's past medical history, labs, and current medications as part of preoperative protocol coverage. The following recommendations have been made:   Patient with diagnosis of A Fib on Eliquis for anticoagulation.     Procedure: sinus surgery Date of procedure: TBD     CHA2DS2-VASc Score = 5  This indicates a 7.2% annual risk of stroke. The patient's score is based upon: CHF History: 1 HTN History: 1 Diabetes History: 0 Stroke History: 0 Vascular Disease History: 1 Age Score: 2 Gender Score: 0     CrCl 64 mL/min Platelet count 156K     Per office protocol, patient can hold Eliquis for 2-3 days prior to procedure.     I will route this recommendation to the requesting party via Epic fax function and remove from pre-op pool.  Please call with questions.  Lenna Sciara, NP 06/18/2022, 4:22 PM

## 2022-06-18 NOTE — Telephone Encounter (Signed)
Patient with diagnosis of A Fib on Eliquis for anticoagulation.    Procedure: sinus surgery Date of procedure: TBD   CHA2DS2-VASc Score = 5  This indicates a 7.2% annual risk of stroke. The patient's score is based upon: CHF History: 1 HTN History: 1 Diabetes History: 0 Stroke History: 0 Vascular Disease History: 1 Age Score: 2 Gender Score: 0    CrCl 64 mL/min Platelet count 156K   Per office protocol, patient can hold Eliquis for 2-3 days prior to procedure.     **This guidance is not considered finalized until pre-operative APP has relayed final recommendations.**

## 2022-06-19 ENCOUNTER — Ambulatory Visit (INDEPENDENT_AMBULATORY_CARE_PROVIDER_SITE_OTHER): Payer: Medicare Other | Admitting: Physician Assistant

## 2022-06-19 ENCOUNTER — Encounter: Payer: Self-pay | Admitting: Physician Assistant

## 2022-06-19 VITALS — BP 121/78 | HR 94 | Temp 98.0°F | Wt 168.8 lb

## 2022-06-19 DIAGNOSIS — Z9049 Acquired absence of other specified parts of digestive tract: Secondary | ICD-10-CM

## 2022-06-19 DIAGNOSIS — K4090 Unilateral inguinal hernia, without obstruction or gangrene, not specified as recurrent: Secondary | ICD-10-CM

## 2022-06-19 DIAGNOSIS — Z09 Encounter for follow-up examination after completed treatment for conditions other than malignant neoplasm: Secondary | ICD-10-CM

## 2022-06-19 NOTE — Progress Notes (Signed)
[facility] POST-OP OFFICE VISIT  06/19/2022  HPI: [name] is a 86 y.o. male 11 days s/p robotic assisted laparoscopic left inguinal hernia repair with Dr Kyle Morales   He is doing well Biggest complaint is fatigue but this has been present since gallbladder surgery in April No fever, chills, nausea, emesis Stools are looser; not taking anything No issues with incisions No other complaints   Vital signs: BP 121/78   Pulse 94   Temp 98 F (36.7 C) (Oral)   Wt 168 lb 12.8 oz (76.6 kg)   SpO2 96%   BMI 23.54 kg/m    Physical Exam: Constitutional: Well appearing male, NAD Abdomen: Soft, non-tender, non-distended, no rebound/guarding Skin: Laparoscopic incisions are healing well, no erythema or drainage   Assessment/Plan: This is a 86 y.o. male  robotic assisted laparoscopic left inguinal hernia repair with Dr Kyle Morales    - Pain control prn  - Reviewed wound care recommendation  - Reviewed lifting restrictions; 6 weeks total  - He can follow up on as needed basis; He understands to call with questions/concerns  -- Kyle Simon, PA-C [facility] 06/19/2022, 3:31 PM M-F: 7am - 4pm

## 2022-06-19 NOTE — Patient Instructions (Signed)

## 2022-06-21 ENCOUNTER — Encounter: Payer: Medicare Other | Admitting: Physician Assistant

## 2022-07-17 DIAGNOSIS — I77819 Aortic ectasia, unspecified site: Secondary | ICD-10-CM | POA: Insufficient documentation

## 2022-07-31 ENCOUNTER — Ambulatory Visit (INDEPENDENT_AMBULATORY_CARE_PROVIDER_SITE_OTHER): Payer: Medicare Other

## 2022-07-31 DIAGNOSIS — I495 Sick sinus syndrome: Secondary | ICD-10-CM

## 2022-07-31 LAB — CUP PACEART REMOTE DEVICE CHECK
Battery Remaining Longevity: 35 mo
Battery Voltage: 2.96 V
Brady Statistic AP VP Percent: 0.65 %
Brady Statistic AP VS Percent: 97.07 %
Brady Statistic AS VP Percent: 0.1 %
Brady Statistic AS VS Percent: 2.18 %
Brady Statistic RA Percent Paced: 96.48 %
Brady Statistic RV Percent Paced: 0.88 %
Date Time Interrogation Session: 20231107151940
Implantable Lead Connection Status: 753985
Implantable Lead Connection Status: 753985
Implantable Lead Implant Date: 20070411
Implantable Lead Implant Date: 20070411
Implantable Lead Location: 753859
Implantable Lead Location: 753860
Implantable Lead Model: 5076
Implantable Lead Model: 5076
Implantable Pulse Generator Implant Date: 20170224
Lead Channel Impedance Value: 323 Ohm
Lead Channel Impedance Value: 361 Ohm
Lead Channel Impedance Value: 380 Ohm
Lead Channel Impedance Value: 437 Ohm
Lead Channel Pacing Threshold Amplitude: 0.625 V
Lead Channel Pacing Threshold Amplitude: 1.25 V
Lead Channel Pacing Threshold Pulse Width: 0.4 ms
Lead Channel Pacing Threshold Pulse Width: 0.4 ms
Lead Channel Sensing Intrinsic Amplitude: 1 mV
Lead Channel Sensing Intrinsic Amplitude: 1 mV
Lead Channel Sensing Intrinsic Amplitude: 5.25 mV
Lead Channel Sensing Intrinsic Amplitude: 5.25 mV
Lead Channel Setting Pacing Amplitude: 2 V
Lead Channel Setting Pacing Amplitude: 2.5 V
Lead Channel Setting Pacing Pulse Width: 0.4 ms
Lead Channel Setting Sensing Sensitivity: 0.9 mV
Zone Setting Status: 755011
Zone Setting Status: 755011

## 2022-08-06 ENCOUNTER — Other Ambulatory Visit: Payer: Self-pay | Admitting: Cardiovascular Disease

## 2022-08-06 NOTE — Telephone Encounter (Signed)
Please schedule F/U appointment for 90 day refills. Thank you! 

## 2022-08-14 ENCOUNTER — Ambulatory Visit: Payer: Medicare Other

## 2022-08-14 DIAGNOSIS — I10 Essential (primary) hypertension: Secondary | ICD-10-CM

## 2022-08-14 DIAGNOSIS — E782 Mixed hyperlipidemia: Secondary | ICD-10-CM

## 2022-08-14 DIAGNOSIS — Z950 Presence of cardiac pacemaker: Secondary | ICD-10-CM

## 2022-08-14 DIAGNOSIS — I480 Paroxysmal atrial fibrillation: Secondary | ICD-10-CM

## 2022-08-14 DIAGNOSIS — I495 Sick sinus syndrome: Secondary | ICD-10-CM

## 2022-08-14 DIAGNOSIS — I5032 Chronic diastolic (congestive) heart failure: Secondary | ICD-10-CM

## 2022-08-27 ENCOUNTER — Encounter: Payer: Self-pay | Admitting: Cardiovascular Disease

## 2022-08-27 ENCOUNTER — Telehealth: Payer: Self-pay | Admitting: Internal Medicine

## 2022-08-27 NOTE — Progress Notes (Signed)
Remote pacemaker transmission.   

## 2022-08-27 NOTE — Telephone Encounter (Signed)
I called and spoke with the patient. I advised him that we are unable to advise him when he is safe to resume his blood thinner post sinus surgery. He is aware the resumption of his blood thinner is at the discretion of the performing physician so he will need to contact that office for instructions regarding restarting his blood thinner post surgery.   The patient voices understanding of the above and is agreeable.

## 2022-08-27 NOTE — Telephone Encounter (Signed)
Pt c/o medication issue:  1. Name of Medication: Eliquis   2. How are you currently taking this medication (dosage and times per day)?   3. Are you having a reaction (difficulty breathing--STAT)?   4. What is your medication issue?  Pt had surgery on left sinus cavity on Friday 12/01. Pt was never given instructions as to when to start his Eliquis back. Was instructed to call his cardiologist. Please advise.

## 2022-08-27 NOTE — Telephone Encounter (Signed)
Error

## 2022-09-26 ENCOUNTER — Encounter: Payer: Self-pay | Admitting: Urology

## 2022-09-26 ENCOUNTER — Ambulatory Visit (INDEPENDENT_AMBULATORY_CARE_PROVIDER_SITE_OTHER): Payer: Medicare Other | Admitting: Urology

## 2022-09-26 VITALS — BP 126/80 | HR 99 | Ht 71.0 in | Wt 161.0 lb

## 2022-09-26 DIAGNOSIS — Z87440 Personal history of urinary (tract) infections: Secondary | ICD-10-CM | POA: Diagnosis not present

## 2022-09-26 DIAGNOSIS — N529 Male erectile dysfunction, unspecified: Secondary | ICD-10-CM | POA: Diagnosis not present

## 2022-09-26 DIAGNOSIS — N401 Enlarged prostate with lower urinary tract symptoms: Secondary | ICD-10-CM | POA: Diagnosis not present

## 2022-09-26 DIAGNOSIS — N390 Urinary tract infection, site not specified: Secondary | ICD-10-CM

## 2022-09-26 LAB — BLADDER SCAN AMB NON-IMAGING

## 2022-09-26 MED ORDER — FINASTERIDE 5 MG PO TABS: 5.0000 mg | ORAL_TABLET | Freq: Every day | ORAL | 3 refills | Status: DC

## 2022-09-26 MED ORDER — TAMSULOSIN HCL 0.4 MG PO CAPS: 0.8000 mg | ORAL_CAPSULE | Freq: Every day | ORAL | 3 refills | Status: AC

## 2022-09-26 NOTE — Progress Notes (Signed)
09/26/22 12:04 PM   Kyle Morales 08-28-1936 [id number]  CC: BPH, recurrent UTIs, ED, nephrolithiasis  HPI: Comorbid and frail 87 year old male who presents to transition his urologic care here from [facility].  He has a long history of BPH and is on maximal medical therapy with Flomax and finasteride.  He also has had problems with recurrent urinary infections, most recently early December 2023.  He reports he gets infections about every 3 months.  Prostate measures 80 g on CT from April 2023.  They discontinued PSA screening at [facility] based on his age.  They previously had offered outlet procedures but he deferred.  His PVRs were previously normal at [facility], but PVR elevated today at 280ml.  He had a negative hematuria workup in 2021 and cystoscopy reportedly showed lateral lobe hypertrophy.  He has a history of nonobstructive kidney stones seen on CT.  He has a long history of ED, he is not interested in trying medications at this time.  Primary urinary symptoms are difficulty initiating stream, intermittency, weak stream.   PMH: Past Medical History:  Diagnosis Date   Aortic atherosclerosis ([place])    Aortic root dilation ([place])    a.) TTE [id number]: Ao root measured 39 mm.   BPH (benign prostatic hyperplasia)    CHF (congestive heart failure) ([place])    Coronary artery disease    a.) remote PCI of the RI in 1997; stent (unknown type) placed.   Depression    Diverticulosis    Eczema    GERD (gastroesophageal reflux disease)    Hyperlipidemia    Hypertension    Inguinal hernia, left    Long term current use of anticoagulant    a.) apixaban   Nephrolithiasis    NSVT (nonsustained ventricular tachycardia) ([facility])    Osteoarthrosis    Paroxysmal A-fib ([place])    a.) CHA2DS2VASc = 5 (age x2, CHF, HTN, vascular disease history);  b.) rate/rhythm maintained on oral metoprolol succinate; chronically anticoagulated with apixaban   Pharyngeal dysphagia    Presence of permanent cardiac  pacemaker 2007   a.) Medtronic device placed in [place] in 2007; b.) generator changed at [facility] on 11/18/2015   RBBB    SSS (sick sinus syndrome) ([place]) 2007   a.) s/p PPM placement in [place] in 2007    Surgical History: Past Surgical History:  Procedure Laterality Date   BLEPHAROPLASTY Bilateral    CARDIAC CATHETERIZATION  09/25/1995   CARDIAC CATHETERIZATION  09/24/1998   COLONOSCOPY  09/24/2010   CORONARY ANGIOPLASTY  09/25/1995   ramus   ESOPHAGOGASTRODUODENOSCOPY N/A 02/11/2017   Procedure: ESOPHAGOGASTRODUODENOSCOPY (EGD);  Surgeon: Lollie Sails, MD;  Location: [facility] ENDOSCOPY;  Service: Endoscopy;  Laterality: N/A;   INGUINAL HERNIA REPAIR Left 1977   INSERT / REPLACE / REMOVE PACEMAKER  12/23/2005   Medtronic [id number] - placed in [place] Left 06/08/2022   Procedure: INSERTION OF MESH;  Surgeon: Ronny Bacon, MD;  Location: [facility] ORS;  Service: General;  Laterality: Left;   [place] N/A 11/18/2015   ROBOTIC ASSISTED LAPAROSCOPIC CHOLECYSTECTOMY  01/12/2022   TONSILLECTOMY     TOTAL HIP ARTHROPLASTY Right 2022   TOTAL SHOULDER REPLACEMENT     bilateral approx 2020 and 2021   Family History: Family History  Problem Relation Age of Onset   Heart disease Mother    Hypertension Mother    Hyperlipidemia Mother    Heart attack Father    Heart disease  Brother     Social History:  reports that he has never smoked. He has never used smokeless tobacco. He reports that he does not drink alcohol and does not use drugs.  Physical Exam: BP 126/80 (BP Location: Left Arm, Patient Position: Sitting, Cuff Size: Normal)   Pulse 99   Ht 5\' 11"  (1.803 m)   Wt 161 lb (73 kg)   BMI 22.45 kg/m    Constitutional: Frail appearing Cardiovascular: No clubbing, cyanosis, or edema. Respiratory: Normal respiratory effort, no increased work of breathing. GI: Abdomen is soft, nontender, nondistended, no abdominal  masses   Laboratory Data: Reviewed, see HPI  Pertinent Imaging: Personally viewed and interpreted the CT from April 2023 showing an 80 g prostate, no hydronephrosis, bilateral nonobstructive stones  Assessment & Plan:   Very comorbid 87 year old male with a number of urologic issues including BPH with incomplete emptying despite maximal medical therapy with Flomax and finasteride, recurrent UTIs, and ED.  I had a long conversation with the patient about consideration of outlet procedures like HOLEP or PAE(prostatic artery embolization).  Risks and benefits discussed extensively.  He is unwilling to accept any potential risks of procedures at this time, but a frank conversation that if he continues to have infections, or develop retention, I would again strongly recommend considering intervention.  He may benefit more from PAE with his overall frailty and his anticoagulation.  RTC 3 months for repeat PVR, re-consider PAE or HoLEP if recurrent infections Refilled Flomax and finasteride  Nickolas Madrid, MD 09/26/2022  [place] [address], [place], [place] [address] [phone]

## 2022-09-26 NOTE — Patient Instructions (Signed)
HOLEP or PAE(prostatic artery embolization) would be 2 surgical options to improve your urination, and be able to come off of prostate medications.  Information about HOLEP is below.  Prostatic artery embolization is a less invasive procedure where a radiologist blocked the blood vessels to the prostate, causing the prostate to shrink significantly and often patients have significant improvement in the urinary symptoms and empties her bladder better.  There is very little to no risk of leakage or incontinence with that procedure, but it can take a few weeks to see improvement.  Holmium Laser Enucleation of the Prostate (HoLEP)  HoLEP is a treatment for men with benign prostatic hyperplasia (BPH). The laser surgery removed blockages of urine flow, and is done without any incisions on the body.     What is HoLEP?  HoLEP is a type of laser surgery used to treat obstruction (blockage) of urine flow as a result of benign prostatic hyperplasia (BPH). In men with BPH, the prostate gland is not cancerous, but has become enlarged. An enlarged prostate can result in a number of urinary tract symptoms such as weak urinary stream, difficulty in starting urination, inability to urinate, frequent urination, or getting up at night to urinate.  HoLEP was developed in the 1990's as a more effective and less expensive surgical option for BPH, compared to other surgical options such as laser vaporization(PVP/greenlight laser), transurethral resection of the prostate(TURP), and open simple prostatectomy.   What happens during a HoLEP?  HoLEP requires general anesthesia ("asleep" throughout the procedure).   An antibiotic is given to reduce the risk of infection  A surgical instrument called a resectoscope is inserted through the urethra (the tube that carries urine from the bladder). The resectoscope has a camera that allows the surgeon to view the internal structure of the prostate gland, and to see where the  incisions are being made during surgery.  The laser is inserted into the resectoscope and is used to enucleate (free up) the enlarged prostate tissue from the capsule (outer shell) and then to seal up any blood vessels. The tissue that has been removed is pushed back into the bladder.  A morcellator is placed through the resectoscope, and is used to suction out the prostate tissue that has been pushed into the bladder.  When the prostate tissue has been removed, the resectoscope is removed, and a foley catheter is placed to allow healing and drain the urine from the bladder.     What happens after a HoLEP?  More than 90% of patients go home the same day a few hours after surgery. Less than 10% will be admitted to the hospital overnight for observation to monitor the urine, or if they have other medical problems.  Fluid is flushed through the catheter for about 1 hour after surgery to clear any blood from the urine. It is normal to have some blood in the urine after surgery. The need for blood transfusion is extremely rare.  Eating and drinking are permitted after the procedure once the patient has fully awakened from anesthesia.  The catheter is usually removed 2-3 days after surgery- the patient will come to clinic to have the catheter removed and make sure they can urinate on their own.  It is very important to drink lots of fluids after surgery for one week to keep the bladder flushed.  At first, there may be some burning with urination, but this typically improved within a few hours to days. Most patients do not have  a significant amount of pain, and narcotic pain medications are rarely needed.  Symptoms of urinary frequency, urgency, and even leakage are NORMAL for the first few weeks after surgery as the bladder adjusts after having to work hard against blockage from the prostate for many years. This will improve, but can sometimes take several months.  The use of pelvic floor  exercises (Kegel exercises) can help improve problems with urinary incontinence.   After catheter removal, patients will be seen at 6 weeks and 6 months for symptom check  No heavy lifting for at least 2-3 weeks after surgery, however patients can walk and do light activities the first day after surgery. Return to work time depends on occupation.    What are the advantages of HoLEP?  HoLEP has been studied in many different parts of the world and has been shown to be a safe and effective procedure. Although there are many types of BPH surgeries available, HoLEP offers a unique advantage in being able to remove a large amount of tissue without any incisions on the body, even in very large prostates, while decreasing the risk of bleeding and providing tissue for pathology (to look for cancer). This decreases the need for blood transfusions during surgery, minimizes hospital stay, and reduces the risk of needing repeat treatment.  What are the side effects of HoLEP?  Temporary burning and bleeding during urination. Some blood may be seen in the urine for weeks after surgery and is part of the healing process.  Urinary incontinence (inability to control urine flow) is expected in all patients immediately after surgery and they should wear pads for the first few days/weeks. This typically improves over the course of several weeks. Performing Kegel exercises can help decrease leakage from stress maneuvers such as coughing, sneezing, or lifting. The rate of long term leakage is very low. Patients may also have leakage with urgency and this may be treated with medication. The risk of urge incontinence can be dependent on several factors including age, prostate size, symptoms, and other medical problems.  Retrograde ejaculation or "backwards ejaculation." In 75% of cases, the patient will not see any fluid during ejaculation after surgery.  Erectile function is generally not significantly affected.    What are the risks of HoLEP?  Injury to the urethra or development of scar tissue at a later date  Injury to the capsule of the prostate (typically treated with longer catheterization).  Injury to the bladder or ureteral orifices (where the urine from the kidney drains out)  Infection of the bladder, testes, or kidneys  Return of urinary obstruction at a later date requiring another operation (<2%)  Need for blood transfusion or re-operation due to bleeding  Failure to relieve all symptoms and/or need for prolonged catheterization after surgery  5-15% of patients are found to have previously undiagnosed prostate cancer in their specimen. Prostate cancer can be treated after HoLEP.  Standard risks of anesthesia including blood clots, heart attacks, etc  When should I call my doctor?  Fever over 101.3 degrees  Inability to urinate, or large blood clots in the urine

## 2022-09-30 NOTE — Progress Notes (Unsigned)
Cardiology Office Note  Date:  10/01/2022   ID:  Morales, Kyle 03/19/1936, MRN [id number]  PCP:  Katrinka Blazing, MD   Chief Complaint  Patient presents with   12 month follow up     Patient c/o shortness of breath with any amount of exertion. Medications reviewed by the patient verbally.      HPI:  [name] is a  87 year-old gentleman history of  paroxysmal atrial fibrillation dating back to 1992   coronary artery disease with stent placement at [facility] in 1997,  sinus node dysfunction pacemaker placement in [place] in 2007 for diagnosis of heart block chronic diarrhea Pacemaker change out done at [facility],  Who presents for followup of his paroxysmal atrial fibrillation and coronary artery disease.  LOV with myself 11/22 Seen by Dr. Graciela Husbands 7/22 for atrial fib Reported fatigue, pacer reprogrammed  Lives at [facility] Numerous recent surgeries over the past year requiring general anesthesia Back injections,  Gall bladder surgery, was urgent Hernia repair surgery Left sinus cavity polyp tumor resected Has had slow recovery from 4 surgeries No regular exercise program, sedentary at baseline Walks with a cane  Reports he is taking Lasix 120 mg daily,  potassium 20 daily BMP reviewed, potassium 3.5, stable renal function  Pacer download reviewed, A-fib burden 0.2%, longest was 3 hours Tolerating anticoagulation with metoprolol  EKG personally reviewed by myself on todays visit Paced  rhythm rate 74 bpm no significant ST-T wave changes  With past medical history reviewed  hip surgery 12/20/20 at [facility] [place] [facility] Notes reviewed, EKGs indicating he developed accelerated junctional rhythm  ECG: 12/23/20 atrial tach, RBBB, single V paced complex Cardiology was consulted at [facility], they reported he had atrial tachycardia Initially placed on diltiazem and metoprolol, metoprolol was held for low blood pressure  Echo at [facility]   2. Echo contrast utilized to enhance endocardial border definition.     3. The left ventricular systolic function is normal, LVEF is visually  estimated at 55-60%.    4. The right ventricle is not well visualized but probably normal in size,  with normal systolic function.   Went to [facility] 10 days for PT  Prior history of UTI, went to [facility] treated with ABX, enterococcus Faecalis in the urine culture  Echocardiogram March 14, 2017 Results discussed with him in detail Ejection fraction 50-55%, mildly dilated aortic root 39 mm  Left atrium severely dilated, mild to moderate MR   Previous history of atrial fibrillation in the setting of GI distress and diarrhea   cultures were negative. Managed by GI   PMH:   has a past medical history of Aortic atherosclerosis (HCC), Aortic root dilation (HCC), BPH (benign prostatic hyperplasia), CHF (congestive heart failure) (HCC), Coronary artery disease, Depression, Diverticulosis, Eczema, GERD (gastroesophageal reflux disease), Hyperlipidemia, Hypertension, Inguinal hernia, left, Long term current use of anticoagulant, Nephrolithiasis, NSVT (nonsustained ventricular tachycardia) (HCC), Osteoarthrosis, Paroxysmal A-fib (HCC), Pharyngeal dysphagia, Presence of permanent cardiac pacemaker (2007), RBBB, and SSS (sick sinus syndrome) (HCC) (2007).  PSH:    Past Surgical History:  Procedure Laterality Date   BLEPHAROPLASTY Bilateral    CARDIAC CATHETERIZATION  09/25/1995   CARDIAC CATHETERIZATION  09/24/1998   CHOLECYSTECTOMY     COLONOSCOPY  09/24/2010   CORONARY ANGIOPLASTY  09/25/1995   ramus   ESOPHAGOGASTRODUODENOSCOPY N/A 02/11/2017   Procedure: ESOPHAGOGASTRODUODENOSCOPY (EGD);  Surgeon: Christena Deem, MD;  Location: [facility] ENDOSCOPY;  Service: Endoscopy;  Laterality: N/A;   INGUINAL HERNIA REPAIR Left 1977  INSERT / REPLACE / REMOVE PACEMAKER  12/23/2005   [employer] [id number] - placed in [place]   INSERTION OF MESH Left 06/08/2022   Procedure: INSERTION OF MESH;  Surgeon: Campbell Lerner, MD;  Location:  [facility] ORS;  Service: General;  Laterality: Left;   MANDIBLE SURGERY  1966   PACEMAKER GENERATOR CHANGE N/A 11/18/2015   ROBOTIC ASSISTED LAPAROSCOPIC CHOLECYSTECTOMY  01/12/2022   TONSILLECTOMY     TOTAL HIP ARTHROPLASTY Right 2022   TOTAL SHOULDER REPLACEMENT     bilateral approx 2020 and 2021    Current Outpatient Medications  Medication Sig Dispense Refill   acetaminophen (TYLENOL) 500 MG tablet Take 500 mg by mouth every 8 (eight) hours as needed.     apixaban (ELIQUIS) 5 MG TABS tablet Take 1 tablet (5 mg total) by mouth 2 (two) times daily. 180 tablet 1   [facility] ZINC 50 MG TABS Take 50 mg by mouth daily.     finasteride (PROSCAR) 5 MG tablet Take 1 tablet (5 mg total) by mouth daily. 90 tablet 3   fluticasone (FLONASE) 50 MCG/ACT nasal spray Place 2 sprays into both nostrils 2 (two) times daily.     furosemide (LASIX) 40 MG tablet Take by mouth. 3 tablets daily and may take 4 tablets if needed     ipratropium (ATROVENT HFA) 17 MCG/ACT inhaler Inhale 2 puffs into the lungs 4 (four) times daily. 1 Inhaler 0   lovastatin (MEVACOR) 20 MG tablet TAKE 1 TABLET AT BEDTIME 90 tablet 3   metoprolol succinate (TOPROL-XL) 50 MG 24 hr tablet Take 1 tablet (50 mg total) by mouth in the morning and at bedtime. Take with or immediately following a meal. 90 tablet 3   nitroGLYCERIN (NITROSTAT) 0.4 MG SL tablet Place 1 tablet (0.4 mg total) under the tongue every 5 (five) minutes as needed for chest pain. 25 tablet 1   omeprazole (PRILOSEC) 20 MG capsule Take 20 mg by mouth as needed.     potassium chloride SA (KLOR-CON) 20 MEQ tablet Take 1 tablet (20 mEq total) by mouth daily. Take with lasix 90 tablet 3   sertraline (ZOLOFT) 50 MG tablet Take 50 mg by mouth daily.     tamsulosin (FLOMAX) 0.4 MG CAPS capsule Take 2 capsules (0.8 mg total) by mouth daily after breakfast. 180 capsule 3   No current facility-administered medications for this visit.    Allergies:   Amoxicillin and Bactrim  [sulfamethoxazole-trimethoprim]   Social History:  The patient  reports that he has never smoked. He has never used smokeless tobacco. He reports that he does not drink alcohol and does not use drugs.   Family History:   family history includes Heart attack in his father; Heart disease in his brother and mother; Hyperlipidemia in his mother; Hypertension in his mother.    Review of Systems: Review of Systems  Constitutional:  Positive for malaise/fatigue.  HENT: Negative.    Respiratory:  Positive for shortness of breath.   Cardiovascular: Negative.   Gastrointestinal: Negative.   Musculoskeletal: Negative.        Leg weakness  Neurological: Negative.   Psychiatric/Behavioral: Negative.    All other systems reviewed and are negative.   PHYSICAL EXAM: VS:  BP 104/64 (BP Location: Left Arm, Patient Position: Sitting, Cuff Size: Normal)   Pulse 74   Ht 5\' 11"  (1.803 m)   Wt 167 lb 6 oz (75.9 kg)   SpO2 98%   BMI 23.34 kg/m  , BMI Body mass index is  23.34 kg/m. Constitutional:  oriented to person, place, and time. No distress.,  Walks with a cane HENT:  Head: Grossly normal Eyes:  no discharge. No scleral icterus.  Neck: No JVD, no carotid bruits  Cardiovascular: Regular rate and rhythm, no murmurs appreciated Pulmonary/Chest: Clear to auscultation bilaterally, no wheezes or rails Abdominal: Soft.  no distension.  no tenderness.  Musculoskeletal: Normal range of motion Neurological:  normal muscle tone. Coordination normal. No atrophy Skin: Skin warm and dry Psychiatric: normal affect, pleasant  Recent Labs: 01/13/2022: Magnesium 1.8 05/31/2022: ALT 13; Platelets 156 06/08/2022: BUN <3; Creatinine, Ser 0.90; Hemoglobin 13.9; Potassium 3.4; Sodium 142    Lipid Panel Lab Results  Component Value Date   CHOL 136 02/20/2017   HDL 32 (L) 02/20/2017   LDLCALC 70 02/20/2017   TRIG 172 (H) 02/20/2017      Wt Readings from Last 3 Encounters:  10/01/22 167 lb 6 oz (75.9 kg)   09/26/22 161 lb (73 kg)  06/19/22 168 lb 12.8 oz (76.6 kg)     ASSESSMENT AND PLAN:  Atrial fibrillation, unspecified type (HCC) - Low A-fib burden, asymptomatic On eliquis, no sx  Nonsustained VT Monitored by EP Rare VT  Shortness of breath Echocardiogram ordered Suspect secondary to deconditioning, sedentary over the past year with numerous surgeries requiring general anesthesia  Pure hypercholesterolemia On lovastatin, total chol 105, LDL75 Primary care to check labs  Atherosclerosis of native coronary artery of native heart  Currently with no symptoms of angina.Continue current medication regimen. Unable to exclude shortness of breath and fatigue as anginal symptoms, echocardiogram ordered If symptoms get worse may need Myoview  Orthostatic hypotension Asymptomatic Continue metoprolol succinate 50 BID for rate and rhythm control  Leg swelling Trace edema Suspect a component of venous insufficiency Taking lasix 120 mg daily Potassium up to 30 medical once daily  Hypothyroidism Followed by primary care   Total encounter time more than 30 minutes  Greater than 50% was spent in counseling and coordination of care with the patient   No orders of the defined types were placed in this encounter.    Signed, Esmond Plants, M.D., Ph.D. 10/01/2022  [place], [place]

## 2022-10-01 ENCOUNTER — Encounter: Payer: Self-pay | Admitting: Cardiovascular Disease

## 2022-10-01 ENCOUNTER — Ambulatory Visit: Payer: Medicare Other | Attending: Cardiovascular Disease | Admitting: Cardiovascular Disease

## 2022-10-01 VITALS — BP 104/64 | HR 74 | Ht 71.0 in | Wt 167.4 lb

## 2022-10-01 DIAGNOSIS — I2510 Atherosclerotic heart disease of native coronary artery without angina pectoris: Secondary | ICD-10-CM | POA: Diagnosis not present

## 2022-10-01 DIAGNOSIS — E782 Mixed hyperlipidemia: Secondary | ICD-10-CM

## 2022-10-01 DIAGNOSIS — I495 Sick sinus syndrome: Secondary | ICD-10-CM | POA: Insufficient documentation

## 2022-10-01 DIAGNOSIS — R0602 Shortness of breath: Secondary | ICD-10-CM | POA: Diagnosis present

## 2022-10-01 DIAGNOSIS — I480 Paroxysmal atrial fibrillation: Secondary | ICD-10-CM | POA: Diagnosis not present

## 2022-10-01 DIAGNOSIS — Z950 Presence of cardiac pacemaker: Secondary | ICD-10-CM | POA: Diagnosis not present

## 2022-10-01 DIAGNOSIS — I10 Essential (primary) hypertension: Secondary | ICD-10-CM | POA: Diagnosis present

## 2022-10-01 DIAGNOSIS — I5032 Chronic diastolic (congestive) heart failure: Secondary | ICD-10-CM | POA: Insufficient documentation

## 2022-10-01 MED ORDER — POTASSIUM CHLORIDE CRYS ER 20 MEQ PO TBCR: EXTENDED_RELEASE_TABLET | ORAL | 2 refills | Status: DC

## 2022-10-01 MED ORDER — APIXABAN 5 MG PO TABS: 5.0000 mg | ORAL_TABLET | Freq: Two times a day (BID) | ORAL | 3 refills | Status: DC

## 2022-10-01 MED ORDER — NITROGLYCERIN 0.4 MG SL SUBL: 0.4000 mg | SUBLINGUAL_TABLET | SUBLINGUAL | 1 refills | Status: DC | PRN

## 2022-10-01 NOTE — Patient Instructions (Addendum)
Echo for shortness of breath  Medication Instructions:  Potassium daily Please take extra potassium 3 days a week  If you need a refill on your cardiac medications before your next appointment, please call your pharmacy.   Lab work: No new labs needed  Testing/Procedures: Your physician has requested that you have an echocardiogram. Echocardiography is a painless test that uses sound waves to create images of your heart. It provides your doctor with information about the size and shape of your heart and how well your heart's chambers and valves are working. This procedure takes approximately one hour. There are no restrictions for this procedure. Please do NOT wear cologne, perfume, aftershave, or lotions (deodorant is allowed). Please arrive 15 minutes prior to your appointment time.   Follow-Up: At [facility], you and your health needs are our priority.  As part of our continuing mission to provide you with exceptional heart care, we have created designated Provider Care Teams.  These Care Teams include your primary Cardiologist (physician) and Advanced Practice Providers (APPs -  Physician Assistants and Nurse Practitioners) who all work together to provide you with the care you need, when you need it.  You will need a follow up appointment in 8 months  Providers on your designated Care Team:   Murray Hodgkins, NP Christell Faith, PA-C Cadence Kathlen Mody, [place]  COVID-19 Vaccine Information can be found at: [email or web address] For questions related to vaccine distribution or appointments, please email [email or web address] or call [phone].

## 2022-10-11 ENCOUNTER — Ambulatory Visit: Payer: Medicare Other | Attending: Cardiovascular Disease

## 2022-10-11 DIAGNOSIS — R0602 Shortness of breath: Secondary | ICD-10-CM | POA: Insufficient documentation

## 2022-10-11 LAB — ECHOCARDIOGRAM COMPLETE
AR max vel: 3.3 cm2
AV Area VTI: 3.27 cm2
AV Area mean vel: 3.19 cm2
AV Mean grad: 2 mmHg
AV Peak grad: 3 mmHg
Ao pk vel: 0.86 m/s
Area-P 1/2: 4.15 cm2
S' Lateral: 3.4 cm

## 2022-10-11 MED ORDER — PERFLUTREN LIPID MICROSPHERE
1.0000 mL | INTRAVENOUS | Status: AC | PRN
  Administered 2022-10-11: 2 mL via INTRAVENOUS

## 2022-10-12 ENCOUNTER — Encounter: Payer: Self-pay | Admitting: Urology

## 2022-10-16 ENCOUNTER — Encounter: Payer: Medicare Other | Admitting: Internal Medicine

## 2022-10-22 ENCOUNTER — Ambulatory Visit: Payer: Medicare Other | Admitting: Student

## 2022-10-22 ENCOUNTER — Encounter: Payer: Self-pay | Admitting: Student

## 2022-10-22 VITALS — BP 122/68 | HR 77 | Temp 98.1°F | Ht 71.0 in | Wt 174.0 lb

## 2022-10-22 DIAGNOSIS — R062 Wheezing: Secondary | ICD-10-CM | POA: Diagnosis not present

## 2022-10-22 DIAGNOSIS — I482 Chronic atrial fibrillation, unspecified: Secondary | ICD-10-CM

## 2022-10-22 DIAGNOSIS — I509 Heart failure, unspecified: Secondary | ICD-10-CM

## 2022-10-22 DIAGNOSIS — I77819 Aortic ectasia, unspecified site: Secondary | ICD-10-CM

## 2022-10-22 DIAGNOSIS — I5032 Chronic diastolic (congestive) heart failure: Secondary | ICD-10-CM

## 2022-10-22 DIAGNOSIS — K219 Gastro-esophageal reflux disease without esophagitis: Secondary | ICD-10-CM

## 2022-10-22 DIAGNOSIS — I495 Sick sinus syndrome: Secondary | ICD-10-CM

## 2022-10-22 DIAGNOSIS — J449 Chronic obstructive pulmonary disease, unspecified: Secondary | ICD-10-CM

## 2022-10-22 DIAGNOSIS — I25118 Atherosclerotic heart disease of native coronary artery with other forms of angina pectoris: Secondary | ICD-10-CM

## 2022-10-22 DIAGNOSIS — F321 Major depressive disorder, single episode, moderate: Secondary | ICD-10-CM

## 2022-10-22 MED ORDER — ALBUTEROL SULFATE HFA 108 (90 BASE) MCG/ACT IN AERS: 2.0000 | INHALATION_SPRAY | Freq: Four times a day (QID) | RESPIRATORY_TRACT | 0 refills | Status: DC | PRN

## 2022-10-22 NOTE — Progress Notes (Signed)
Location:   [facility]   Place of Service:   [facility]  Provider: Sydnee CabalBeamer  Code Status: Full Code   Goals of Care:     10/22/2022    1:32 PM  Advanced Directives  Does Patient Have a Medical Advance Directive? Yes  Type of Advance Directive Living will  Does patient want to make changes to medical advance directive? No - Patient declined     Chief Complaint  Patient presents with   Establish Care    NP to Establish Care    HPI: Patient is a 87 y.o. male seen today for medical management of chronic diseases.  "Kyle Morales"  The Chronic cough has been bothering him, but other than that he has been doing pretty well. He had nasal surgery last month. He had nasal tumor removed. He uses a lavage for the nose. He feels like he has had more drainage since then. His legs have been itching and burning for 3 weeks, but then he started to have some swelling today.   Multimorbidity: Afib, CHF, Aortic ectasia, Tchy-brady: He doesn't have symptoms with his heart rate. He has a Visual merchandiserpace maker. His cardiologist is now at the [facility]. Dr. Odis LusterGollam and Graciela Hus[facility]Klein [facility]. His next appt in 10/2022. He usually wears compression stockings. 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  Gerd: No problems. He has been on it for  Stasis dermatitis BPH: Hopes to eventual  Depression - started 4 months ago with Dr. Boneta LucksHalpert. Some improvement, but they wanted to talk about the mood again.  Atrovent  Medications - interested in discontinuing meds if/when possible.   Memory: No issue with memory in his opinion  Mobility: No concerns about his mobility. Last year he had one fall and injured his hip and ribs. He has had good recovery. This was after his previous hip replacement. He uses a cane. He has balance issues. When he gets up at night he uses a rollator -- some fear of falling and rollator keeps him more  stable. He feels unsteady on his feet. No lightheaded or dizziness. He was in physical therapy last year after the fall. He has had 2 shoulder replacements.   Matters Most - mobility maintence is most important to him. He and his wife have driven across the nation 6x. He would like to go to [place] as well.   He was in the miliatry - in the [employer] for 21 years (he is grateful he had medical insurance and he was an Secondary school teacherinstructor, Quarry managercomputer programmer, and a cop). 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 They have been married for 38 years. Lived in [place] almost 13 years. He never smoked.   S/p cataract surgery, and has dermatologist.  Past Medical History:  Diagnosis Date   Aortic atherosclerosis (HCC)    Aortic root dilation (HCC)    a.) TTE [id number]: Ao root measured 39 mm.   BPH (benign prostatic hyperplasia)    CHF (congestive heart failure) (HCC)    Coronary artery disease    a.) remote PCI of the RI in 1997; stent (unknown type) placed.   Depression    Diverticulosis    Eczema    GERD (gastroesophageal reflux disease)    Hyperlipidemia    Hypertension    Inguinal hernia, left  Long term current use of anticoagulant    a.) apixaban   Nephrolithiasis    NSVT (nonsustained ventricular tachycardia) (HCC)    Osteoarthrosis    Paroxysmal A-fib (HCC)    a.) CHA2DS2VASc = 5 (age x2, CHF, HTN, vascular disease history);  b.) rate/rhythm maintained on oral metoprolol succinate; chronically anticoagulated with apixaban   Pharyngeal dysphagia    Presence of permanent cardiac pacemaker 2007   a.) Medtronic device placed in [place] in 2007; b.) generator changed at [facility] on 11/18/2015   RBBB    SSS (sick sinus syndrome) (HCC) 2007   a.) s/p PPM placement in [place] in 2007    Past Surgical History:  Procedure Laterality Date   BLEPHAROPLASTY Bilateral    CARDIAC CATHETERIZATION  09/25/1995   CARDIAC  CATHETERIZATION  09/24/1998   CHOLECYSTECTOMY     COLONOSCOPY  09/24/2010   CORONARY ANGIOPLASTY  09/25/1995   ramus   ESOPHAGOGASTRODUODENOSCOPY N/A 02/11/2017   Procedure: ESOPHAGOGASTRODUODENOSCOPY (EGD);  Surgeon: Christena Deem, MD;  Location: [facility] ENDOSCOPY;  Service: Endoscopy;  Laterality: N/A;   INGUINAL HERNIA REPAIR Left 1977   INSERT / REPLACE / REMOVE PACEMAKER  12/23/2005   Medtronic [id number] - placed in [place]   INSERTION OF MESH Left 06/08/2022   Procedure: INSERTION OF MESH;  Surgeon: Campbell Lerner, MD;  Location: [facility] ORS;  Service: General;  Laterality: Left;   MANDIBLE SURGERY  1966   PACEMAKER GENERATOR CHANGE N/A 11/18/2015   ROBOTIC ASSISTED LAPAROSCOPIC CHOLECYSTECTOMY  01/12/2022   TONSILLECTOMY     TOTAL HIP ARTHROPLASTY Right 2022   TOTAL SHOULDER REPLACEMENT     bilateral approx 2020 and 2021    Allergies  Allergen Reactions   Amoxicillin Rash and Itching   Bactrim [Sulfamethoxazole-Trimethoprim] Rash    Outpatient Encounter Medications as of 10/22/2022  Medication Sig   acetaminophen (TYLENOL) 500 MG tablet Take 500 mg by mouth every 8 (eight) hours as needed.   albuterol (VENTOLIN HFA) 108 (90 Base) MCG/ACT inhaler Inhale 2 puffs into the lungs every 6 (six) hours as needed for wheezing or shortness of breath.   apixaban (ELIQUIS) 5 MG TABS tablet Take 1 tablet (5 mg total) by mouth 2 (two) times daily.   [employer] ZINC 50 MG TABS Take 50 mg by mouth daily.   finasteride (PROSCAR) 5 MG tablet Take 1 tablet (5 mg total) by mouth daily.   fluticasone (FLONASE) 50 MCG/ACT nasal spray Place 2 sprays into both nostrils 2 (two) times daily.   furosemide (LASIX) 40 MG tablet Take by mouth. 3 tablets daily and may take 4 tablets if needed   ipratropium (ATROVENT HFA) 17 MCG/ACT inhaler Inhale 2 puffs into the lungs 4 (four) times daily.   lovastatin (MEVACOR) 20 MG tablet TAKE 1 TABLET AT BEDTIME   metoprolol succinate (TOPROL-XL) 50 MG 24 hr tablet  Take 1 tablet (50 mg total) by mouth in the morning and at bedtime. Take with or immediately following a meal.   nitroGLYCERIN (NITROSTAT) 0.4 MG SL tablet Place 1 tablet (0.4 mg total) under the tongue every 5 (five) minutes as needed for chest pain.   omeprazole (PRILOSEC) 20 MG capsule Take 20 mg by mouth as needed.   potassium chloride SA (KLOR-CON M) 20 MEQ tablet Take with 1 tablet daily alternating with 2 tablets daily every other day. Take with lasix   sertraline (ZOLOFT) 50 MG tablet Take 50 mg by mouth daily.   tamsulosin (FLOMAX) 0.4 MG CAPS capsule Take 2 capsules (0.8 mg  total) by mouth daily after breakfast.   No facility-administered encounter medications on file as of 10/22/2022.    Review of Systems:  Review of Systems  All other systems reviewed and are negative.   Health Maintenance  Topic Date Due   Medicare Annual Wellness (AWV)  Never done   DTaP/Tdap/Td (1 - Tdap) Never done   COVID-19 Vaccine (5 - 2023-24 season) 05/25/2022   Pneumonia Vaccine 66+ Years old  Completed   INFLUENZA VACCINE  Completed   Zoster Vaccines- Shingrix  Completed   HPV VACCINES  Aged Out    Physical Exam: Vitals:   10/22/22 1333  BP: 122/68  Pulse: 77  Temp: 98.1 F (36.7 C)  SpO2: 96%  Weight: 174 lb (78.9 kg)  Height: 5\' 11"  (1.803 m)   Body mass index is 24.27 kg/m. Physical Exam Vitals reviewed.  Constitutional:      Appearance: Normal appearance.  Cardiovascular:     Rate and Rhythm: Normal rate and regular rhythm.  Pulmonary:     Breath sounds: Wheezing present.  Abdominal:     General: Bowel sounds are normal.     Palpations: Abdomen is soft.  Skin:    General: Skin is warm and dry.  Neurological:     Mental Status: He is alert and oriented to person, place, and time.  Psychiatric:        Mood and Affect: Mood normal.        Behavior: Behavior normal.        Thought Content: Thought content normal.        Judgment: Judgment normal.     Labs  reviewed: Basic Metabolic Panel: Recent Labs    01/11/22 0414 01/13/22 0752 05/31/22 1144 06/08/22 0903  NA 140 138 141 142  K 3.5 4.0 3.5 3.4*  CL 107 110 105 99  CO2 27 24 28   --   GLUCOSE 134* 98 123* 98  BUN 13 13 14  <3*  CREATININE 0.79 0.79 0.94 0.90  CALCIUM 8.7* 8.1* 9.1  --   MG  --  1.8  --   --    Liver Function Tests: Recent Labs    01/10/22 2039 01/11/22 0414 05/31/22 1144  AST 16 17 20   ALT 9 10 13   ALKPHOS 76 64 59  BILITOT 1.1 1.1 1.4*  PROT 7.2 6.2* 6.9  ALBUMIN 4.2 3.6 4.1   Recent Labs    01/10/22 2039  LIPASE 29   No results for input(s): "AMMONIA" in the last 8760 hours. CBC: Recent Labs    01/11/22 0414 01/13/22 0752 05/31/22 1144 06/08/22 0903  WBC 8.0 6.7 5.6  --   HGB 13.1 11.4* 14.2 13.9  HCT 39.3 34.8* 42.8 41.0  MCV 96.6 99.1 98.2  --   PLT 140* 137* 156  --    Lipid Panel: No results for input(s): "CHOL", "HDL", "LDLCALC", "TRIG", "CHOLHDL", "LDLDIRECT" in the last 8760 hours. No results found for: "HGBA1C"  Procedures since last visit: ECHOCARDIOGRAM COMPLETE  Result Date: 10/11/2022    ECHOCARDIOGRAM REPORT   Patient Name:   Kyle Morales Date of Exam: 10/11/2022 Medical Rec #:  07/31/22      Height:       71.0 in Accession #:    06/10/22     Weight:       167.4 lb Date of Birth:  [birth year]      BSA:          1.955 m Patient Age:  86 years       BP:           124/78 mmHg Patient Gender: M              HR:           90 bpm. Exam Location:  [facility] Procedure: 2D Echo, Cardiac Doppler, Color Doppler and Intracardiac            Opacification Agent Indications:     R06.02 SOB  History:         Patient has prior history of Echocardiogram examinations, most                  recent 03/14/2017. CHF, CAD, Arrythmias:Atrial Fibrillation,                  Signs/Symptoms:Shortness of Breath; Risk Factors:Hypertension,                  Dyslipidemia and Non-Smoker.  Sonographer:     Wilkie Aye RVT RCS Referring Phys:  [place] Diagnosing Phys: Kyle Rogue MD  Sonographer Comments: Suboptimal apical window. IMPRESSIONS  1. Left ventricular ejection fraction, by estimation, is 50 to 55%. The left ventricle has low normal function. The left ventricle has no regional wall motion abnormalities. There is moderate to severe left ventricular hypertrophy mid ventricle extending distally, severe in the apical region with near cavity obliteration. Left ventricular diastolic parameters are indeterminate.  2. Right ventricular systolic function is normal. The right ventricular size is normal. There is mildly elevated pulmonary artery systolic pressure. The estimated right ventricular systolic pressure is 96.2 mmHg.  3. Left atrial size was severely dilated.  4. The mitral valve is normal in structure. Mild mitral valve regurgitation. No evidence of mitral stenosis.  5. The aortic valve is tricuspid. Aortic valve regurgitation is not visualized. No aortic stenosis is present.  6. There is borderline dilatation of the ascending aorta, measuring 37 mm.  7. The inferior vena cava is normal in size with greater than 50% respiratory variability, suggesting right atrial pressure of 3 mmHg. Comparison(s): 03/14/17-EF 50-55%. FINDINGS  Left Ventricle: Left ventricular ejection fraction, by estimation, is 50 to 55%. The left ventricle has low normal function. The left ventricle has no regional wall motion abnormalities. The left ventricular internal cavity size was normal in size. There is moderate to severe left ventricular hypertrophy mid ventricle extending distally, severe in the apical region with near cavity obliteration. Left ventricular diastolic parameters are indeterminate. Right Ventricle: The right ventricular size is normal. No increase in right ventricular wall thickness. Right ventricular systolic function is normal. There is mildly elevated pulmonary artery systolic pressure. The tricuspid regurgitant velocity is 2.79  m/s, and with an  assumed right atrial pressure of 5 mmHg, the estimated right ventricular systolic pressure is 95.2 mmHg. Left Atrium: Left atrial size was severely dilated. Right Atrium: Right atrial size was normal in size. Pericardium: There is no evidence of pericardial effusion. Mitral Valve: The mitral valve is normal in structure. Mild mitral valve regurgitation. No evidence of mitral valve stenosis. Tricuspid Valve: The tricuspid valve is normal in structure. Tricuspid valve regurgitation is mild . No evidence of tricuspid stenosis. Aortic Valve: The aortic valve is tricuspid. Aortic valve regurgitation is not visualized. No aortic stenosis is present. Aortic valve mean gradient measures 2.0 mmHg. Aortic valve peak gradient measures 3.0 mmHg. Aortic valve area, by VTI measures 3.27 cm. Pulmonic Valve: The pulmonic valve was normal  in structure. Pulmonic valve regurgitation is not visualized. No evidence of pulmonic stenosis. Aorta: The aortic root is normal in size and structure. There is borderline dilatation of the ascending aorta, measuring 37 mm. Venous: The inferior vena cava is normal in size with greater than 50% respiratory variability, suggesting right atrial pressure of 3 mmHg. IAS/Shunts: No atrial level shunt detected by color flow Doppler. Additional Comments: A device lead is visualized.  LEFT VENTRICLE PLAX 2D LVIDd:         4.90 cm   Diastology LVIDs:         3.40 cm   LV e' medial:    5.50 cm/s LV PW:         1.40 cm   LV E/e' medial:  16.2 LV IVS:        1.20 cm   LV e' lateral:   9.15 cm/s LVOT diam:     2.50 cm   LV E/e' lateral: 9.7 LV SV:         55 LV SV Index:   28 LVOT Area:     4.91 cm  RIGHT VENTRICLE             IVC RV Basal diam:  3.30 cm     IVC diam: 1.70 cm RV Mid diam:    2.60 cm RV S prime:     11.20 cm/s TAPSE (M-mode): 1.6 cm LEFT ATRIUM              Index        RIGHT ATRIUM           Index LA diam:        5.90 cm  3.02 cm/m   RA Area:     15.00 cm LA Vol (A2C):   162.0 ml 82.87 ml/m   RA Volume:   32.10 ml  16.42 ml/m LA Vol (A4C):   161.0 ml 82.36 ml/m LA Biplane Vol: 163.0 ml 83.38 ml/m  AORTIC VALVE                    PULMONIC VALVE AV Area (Vmax):    3.30 cm     PV Vmax:          0.56 m/s AV Area (Vmean):   3.19 cm     PV Peak grad:     1.3 mmHg AV Area (VTI):     3.27 cm     PR End Diast Vel: 1.78 msec AV Vmax:           86.20 cm/s AV Vmean:          63.000 cm/s AV VTI:            0.168 m AV Peak Grad:      3.0 mmHg AV Mean Grad:      2.0 mmHg LVOT Vmax:         58.00 cm/s LVOT Vmean:        41.000 cm/s LVOT VTI:          0.112 m LVOT/AV VTI ratio: 0.67  AORTA Ao Root diam: 3.60 cm Ao Asc diam:  3.70 cm Ao Arch diam: 3.5 cm MITRAL VALVE               TRICUSPID VALVE MV Area (PHT): 4.15 cm    TR Peak grad:   31.1 mmHg MV Decel Time: 183 msec    TR Vmax:        279.00 cm/s MV E  velocity: 89.10 cm/s                            SHUNTS                            Systemic VTI:  0.11 m                            Systemic Diam: 2.50 cm Kyle Rogue MD Electronically signed by Kyle Rogue MD Signature Date/Time: 10/11/2022/4:56:14 PM    Final (Updated)     Assessment/Plan 1. Wheezing  COPD  Patient has had chronic cough. He has wheezing in all lung fields. Discussed the importance of albuterol and tiotropium. Patient has not been on chronic ICS or LABA. Concern for progression of symptoms from COPD. Discussed possible improvement with SABA, however, would like clarity on improvement with this intervention. Plan to start Albuterol q4 hours PRN. Continue Tiotropium. Will also order Spirometry for evaluation of obstructive disease.   2. Chronic atrial fibrillation (HCC) Rate Well-controlled. Continue apixiban and metoprolol.   3. Congestive heart failure, unspecified HF chronicity, unspecified heart failure type ([place]) Appears euvolemic on exam. Continue lasix   4. Chronic diastolic CHF (congestive heart failure) ([place]) 5. Tachy-brady syndrome ([place])  Pace Maker in place, continue  Cardiology management.   6. Aortic ectasia, unspecified site ([place]) Known per recent echocardiogram. CTM.   7. GERD without esophagitis Symptoms well-controlled on omeprazole. Plan to wean after 27mo treatment.   8. Current moderate episode of major depressive disorder, unspecified whether recurrent ([place]) Recently started on treatment. Discussed increasing dose. Consider rapid upward titration of medication.   9. Atherosclerosis of native coronary artery of native heart with stable angina pectoris ([place]) No symptoms at this time. Continue nitroglycerine and lovastatin.     Labs/tests ordered:  * No order type specified * Next appt: 1 mo. Plan to order spirometry at this time to further evaluate symptoms.

## 2022-10-22 NOTE — Patient Instructions (Addendum)
Nice to meet you both!  Please start using albuterol every 4 hours as needed. If you are not seeing relief, we may need to try the inhaled steroid treatment.   I'll see you in 1 month.

## 2022-10-29 ENCOUNTER — Other Ambulatory Visit: Payer: Self-pay | Admitting: Cardiovascular Disease

## 2022-10-30 ENCOUNTER — Ambulatory Visit: Payer: Medicare Other

## 2022-10-30 ENCOUNTER — Telehealth: Payer: Self-pay | Admitting: Internal Medicine

## 2022-10-30 DIAGNOSIS — I495 Sick sinus syndrome: Secondary | ICD-10-CM

## 2022-10-30 NOTE — Telephone Encounter (Signed)
I called [employer] tech support to get help for the patient. They are sending him a new handheld in 7-10 business days.

## 2022-10-30 NOTE — Telephone Encounter (Signed)
Patient is following up due to not hearing back from anyone. Please advise.

## 2022-10-30 NOTE — Telephone Encounter (Signed)
Patient is calling stating his handset is not working. He reports it is giving two error codes. Patient called [employer] and was unable to speak with a live person. He states it advised him to try something which he did and if that didn't work to call our office regarding it. Please advise.

## 2022-10-31 ENCOUNTER — Other Ambulatory Visit: Payer: Self-pay | Admitting: Student

## 2022-10-31 DIAGNOSIS — R062 Wheezing: Secondary | ICD-10-CM

## 2022-11-05 LAB — CUP PACEART REMOTE DEVICE CHECK
Battery Remaining Longevity: 32 mo
Battery Voltage: 2.95 V
Brady Statistic AP VP Percent: 0.59 %
Brady Statistic AP VS Percent: 93.37 %
Brady Statistic AS VP Percent: 0.24 %
Brady Statistic AS VS Percent: 5.81 %
Brady Statistic RA Percent Paced: 93.16 %
Brady Statistic RV Percent Paced: 0.8 %
Date Time Interrogation Session: 20240212121107
Implantable Lead Connection Status: 753985
Implantable Lead Connection Status: 753985
Implantable Lead Implant Date: 20070411
Implantable Lead Implant Date: 20070411
Implantable Lead Location: 753859
Implantable Lead Location: 753860
Implantable Lead Model: 5076
Implantable Lead Model: 5076
Implantable Pulse Generator Implant Date: 20170224
Lead Channel Impedance Value: 323 Ohm
Lead Channel Impedance Value: 361 Ohm
Lead Channel Impedance Value: 399 Ohm
Lead Channel Impedance Value: 437 Ohm
Lead Channel Pacing Threshold Amplitude: 0.625 V
Lead Channel Pacing Threshold Amplitude: 1.25 V
Lead Channel Pacing Threshold Pulse Width: 0.4 ms
Lead Channel Pacing Threshold Pulse Width: 0.4 ms
Lead Channel Sensing Intrinsic Amplitude: 1.25 mV
Lead Channel Sensing Intrinsic Amplitude: 1.25 mV
Lead Channel Sensing Intrinsic Amplitude: 5.5 mV
Lead Channel Sensing Intrinsic Amplitude: 5.5 mV
Lead Channel Setting Pacing Amplitude: 2 V
Lead Channel Setting Pacing Amplitude: 2.5 V
Lead Channel Setting Pacing Pulse Width: 0.4 ms
Lead Channel Setting Sensing Sensitivity: 0.9 mV
Zone Setting Status: 755011
Zone Setting Status: 755011

## 2022-11-06 ENCOUNTER — Encounter: Payer: Self-pay | Admitting: Internal Medicine

## 2022-11-06 ENCOUNTER — Ambulatory Visit: Payer: Medicare Other | Attending: Internal Medicine | Admitting: Internal Medicine

## 2022-11-06 VITALS — BP 118/70 | HR 88 | Ht 71.0 in | Wt 171.8 lb

## 2022-11-06 DIAGNOSIS — I2510 Atherosclerotic heart disease of native coronary artery without angina pectoris: Secondary | ICD-10-CM | POA: Diagnosis not present

## 2022-11-06 DIAGNOSIS — I482 Chronic atrial fibrillation, unspecified: Secondary | ICD-10-CM | POA: Diagnosis present

## 2022-11-06 LAB — PACEMAKER DEVICE OBSERVATION

## 2022-11-06 MED ORDER — ATENOLOL 50 MG PO TABS: 50.0000 mg | ORAL_TABLET | Freq: Every day | ORAL | 0 refills | Status: DC

## 2022-11-06 MED ORDER — NEBIVOLOL HCL 5 MG PO TABS: 5.0000 mg | ORAL_TABLET | Freq: Every day | ORAL | 0 refills | Status: DC

## 2022-11-06 MED ORDER — BISOPROLOL FUMARATE 5 MG PO TABS: 5.0000 mg | ORAL_TABLET | Freq: Every day | ORAL | 0 refills | Status: DC

## 2022-11-06 MED ORDER — METOPROLOL TARTRATE 50 MG PO TABS: 50.0000 mg | ORAL_TABLET | Freq: Two times a day (BID) | ORAL | 0 refills | Status: DC

## 2022-11-06 NOTE — Patient Instructions (Signed)
Medication Instructions:  Your physician has recommended you make the following change in your medication:   Increase furosemide (LASIX) 40 MG tablet to 4 tablets (160 MG) by mouth daily for 5 days . You have been given 4 different beta blockers to try. You may take them in any order, but DO NOT take more than 1 at a time. Please try to give at least 2 weeks on each medication to see what works the best for you. If you decide which 1 you like, please call the office at [phone] so we may send in additional refills for you.   *If you need a refill on your cardiac medications before your next appointment, please call your pharmacy*   Lab Work: - None ordered If you have labs (blood work) drawn today and your tests are completely normal, you will receive your results only by: [place] (if you have MyChart) OR A paper copy in the mail If you have any lab test that is abnormal or we need to change your treatment, we will call you to review the results.   Testing/Procedures: -None ordered   Follow-Up: At [facility], you and your health needs are our priority.  As part of our continuing mission to provide you with exceptional heart care, we have created designated Provider Care Teams.  These Care Teams include your primary Cardiologist (physician) and Advanced Practice Providers (APPs -  Physician Assistants and Nurse Practitioners) who all work together to provide you with the care you need, when you need it.  We recommend signing up for the patient portal called "MyChart".  Sign up information is provided on this After Visit Summary.  MyChart is used to connect with patients for Virtual Visits (Telemedicine).  Patients are able to view lab/test results, encounter notes, upcoming appointments, etc.  Non-urgent messages can be sent to your provider as well.   To learn more about what you can do with MyChart, go to [email or web address].    Your next appointment:   1  year(s)  Provider:   Virl Axe, MD    Other Instructions - None

## 2022-11-06 NOTE — Progress Notes (Signed)
Patient ID: Kyle Morales, male   DOB: March 07, 1936, 87 y.o.   MRN: [id number]      Patient Care Team: Dewayne Shorter, MD as PCP - General (Family Medicine) Rockey Situ Kathlene November, MD as PCP - Cardiology (Cardiology) Minna Merritts, MD as Consulting Physician (Cardiology) Heber Halawa, MD as Referring Physician (Otolaryngology) Billey Co, MD as Consulting Physician (Urology) Jamelle Rushing, MD as Referring Physician (Dermatology)   HPI  Kyle Morales is a 87 y.o. male Seen in  follow-up for Medtronic pacemaker implanted for sinus node dysfunction remotely w gen change 2/17  Also atrial fibrillation dating at least back to 2014  for which he had been treated with amiodarone and warfarin.  Now on apixoban and no Anti-arrhythmic drugs no bleeding  Coronary artery disease with prior stenting remotely at [facility] postoperative "atrial tachycardia "treated with calcium blockers and metoprolol.  Interval surgery as outlined in Dr. Donivan Scull note 1/24  Problems with recurrent atrial fibrillation but this has been infrequent.  Also complaining of fatigue in the past prompting a beta-blocker exclusion which was never consummated.  Also now complaining of orthopnea and some dyspnea on exertion.  He is chronic edema.  Drinks a lot because he is thirsty     DATE TEST EF%   6/14 Myoview    65 %   6/15 Myoview    6/18 Echo 50%   6/19 CTA  Aorta 3.7 cm   4/22 Echo (CE)  55-60%   1/24 Echo  50-55% LVH 12-80m   Date Cr K Hgb  8/18  1.11 4.2    5/20 0.87 4.3 15.2  12/21 0.89  15.3  4/22 0.83 3.8 11.6  11/22 0.76 3.6 14.2  11/23 (CE) 0.81 3.5 13.9 (9/23)    Past Medical History:  Diagnosis Date   Aortic atherosclerosis ([facility]    Aortic root dilation ([facility]    a.) TTE [id number] Ao root measured 39 mm.   BPH (benign prostatic hyperplasia)    CHF (congestive heart failure) ([facility]    Coronary artery disease    a.) remote PCI of the RI in 1997; stent (unknown type) placed.    Depression    Diverticulosis    Eczema    GERD (gastroesophageal reflux disease)    Hyperlipidemia    Hypertension    Inguinal hernia, left    Long term current use of anticoagulant    a.) apixaban   Nephrolithiasis    NSVT (nonsustained ventricular tachycardia) (HCC)    Osteoarthrosis    Paroxysmal A-fib ([facility]    a.) CHA2DS2VASc = 5 (age x2, CHF, HTN, vascular disease history);  b.) rate/rhythm maintained on oral metoprolol succinate; chronically anticoagulated with apixaban   Pharyngeal dysphagia    Presence of permanent cardiac pacemaker 2007   a[place]; [place] [facility]   RBBB    SSS (sick sinus syndrome) ([place] 2007   a[place]    Past Surgical History:  Procedure Laterality Date   BLEPHAROPLASTY Bilateral    CARDIAC CATHETERIZATION  09/25/1995   CARDIAC CATHETERIZATION  09/24/1998   CHOLECYSTECTOMY     COLONOSCOPY  09/24/2010   CORONARY ANGIOPLASTY  09/25/1995   ramus   ESOPHAGOGASTRODUODENOSCOPY N/A 02/11/2017   Procedure: ESOPHAGOGASTRODUODENOSCOPY (EGD);  Surgeon: SLollie Sails MD;  [facility];  Service: Endoscopy;  Laterality: N/A;   INGUINAL HERNIA REPAIR Left 1977   INSERT / REPLACE /  REMOVE PACEMAKER  12/23/2005   Medtronic [id number] H - placed in [place] Left 06/08/2022   Procedure: INSERTION OF MESH;  Surgeon: Ronny Bacon, MD;  Location: [facility] ORS;  Service: General;  Laterality: Left;   [place] N/A 11/18/2015   ROBOTIC ASSISTED LAPAROSCOPIC CHOLECYSTECTOMY  01/12/2022   TONSILLECTOMY     TOTAL HIP ARTHROPLASTY Right 2022   TOTAL SHOULDER REPLACEMENT     bilateral approx 2020 and 2021    Current Outpatient Medications  Medication Sig Dispense Refill   acetaminophen (TYLENOL) 500 MG tablet Take 500 mg by mouth every 8 (eight) hours as needed.     albuterol (VENTOLIN HFA)  108 (90 Base) MCG/ACT inhaler Inhale 2 puffs into the lungs every 6 (six) hours as needed for wheezing or shortness of breath. 8 g 0   apixaban (ELIQUIS) 5 MG TABS tablet Take 1 tablet (5 mg total) by mouth 2 (two) times daily. 180 tablet 3   [facility] ZINC 50 MG TABS Take 50 mg by mouth daily.     finasteride (PROSCAR) 5 MG tablet Take 1 tablet (5 mg total) by mouth daily. 90 tablet 3   fluticasone (FLONASE) 50 MCG/ACT nasal spray Place 2 sprays into both nostrils 2 (two) times daily.     furosemide (LASIX) 40 MG tablet Take by mouth. 3 tablets daily and may take 4 tablets if needed     ipratropium (ATROVENT HFA) 17 MCG/ACT inhaler Inhale 2 puffs into the lungs 4 (four) times daily. 1 Inhaler 0   lovastatin (MEVACOR) 20 MG tablet TAKE 1 TABLET AT BEDTIME 90 tablet 3   metoprolol succinate (TOPROL-XL) 50 MG 24 hr tablet TAKE 1 TABLET TWICE A DAY. TAKE WITH OR IMMEDIATELY FOLLOWING A MEAL 60 tablet 0   nitroGLYCERIN (NITROSTAT) 0.4 MG SL tablet Place 1 tablet (0.4 mg total) under the tongue every 5 (five) minutes as needed for chest pain. 25 tablet 1   omeprazole (PRILOSEC) 20 MG capsule Take 20 mg by mouth as needed.     potassium chloride SA (KLOR-CON M) 20 MEQ tablet Take with 1 tablet daily alternating with 2 tablets daily every other day. Take with lasix 135 tablet 2   sertraline (ZOLOFT) 50 MG tablet Take 50 mg by mouth daily.     tamsulosin (FLOMAX) 0.4 MG CAPS capsule Take 2 capsules (0.8 mg total) by mouth daily after breakfast. 180 capsule 3   No current facility-administered medications for this visit.    Allergies  Allergen Reactions   Amoxicillin Rash and Itching   Bactrim [Sulfamethoxazole-Trimethoprim] Rash   Review of Systems negative except from HPI and PMH  Physical Exam:  BP 118/70   Pulse 88   Ht 5' 11"$  (1.803 m)   Wt 171 lb 12.8 oz (77.9 kg)   SpO2 96%   BMI 23.96 kg/m   Well developed and well nourished in no acute distress HENT normal Neck supple with JVP 8 cm  positive HJR  clear Device pocket well healed; without hematoma or erythema.  There is no tethering  Regular rate and rhythm, no  gallop No  murmur Abd-soft with active BS No Clubbing cyanosis 1-2+ edema Skin-warm and dry A & Oriented  Grossly normal sensory and motor function  ECG atrial pacing at 88 Goals 29/11/43 Right bundle branch block incomplete  Device function is normal. Programming changes none  See Paceart for details    Assessment and  Plan Atrial fibrillation-paroxysmal  Sinus node dysfunction  Pacemaker-Medtronic       Cardiomyopathy  Ectatic Aorta  Fatigue  HFpEF   VT NS   Continues to complain of fatigue.  Again we discussed the possibility of the metoprolol, and I have given him prescriptions for atenolol 50, metoprolol tartrate 50 twice daily, bisoprolol 5 nebivolol 5.  Each to take for a month and see if we can find something that relieves his fatigue.  I am aware that some of these doses are little bit low we will see if we get some benefit first  He is volume overloaded.  He has neck vein distention, peripheral edema and bendopnea.  I will have him increase his furosemide from 120--160 mg daily x 5 days.  He may well benefit from transition to torsemide.  No bleeding.  Continue Eliquis 5 twice daily dose for weight and renal function

## 2022-11-22 ENCOUNTER — Encounter: Payer: Self-pay | Admitting: Student

## 2022-11-22 LAB — BASIC METABOLIC PANEL
BUN: 18 (ref 4–21)
CO2: 30 — AB (ref 13–22)
Chloride: 103 (ref 99–108)
Creatinine: 1 (ref 0.6–1.3)
Glucose: 106
Potassium: 3.3 mEq/L — AB (ref 3.5–5.1)
Sodium: 140 (ref 137–147)

## 2022-11-22 LAB — CBC AND DIFFERENTIAL
HCT: 42 (ref 41–53)
Hemoglobin: 14.2 (ref 13.5–17.5)
Platelets: 146 10*3/uL — AB (ref 150–400)
WBC: 5.6

## 2022-11-22 LAB — COMPREHENSIVE METABOLIC PANEL
Albumin: 4.3 (ref 3.5–5.0)
Calcium: 9.3 (ref 8.7–10.7)
Globulin: 2.5

## 2022-11-22 LAB — VITAMIN D 25 HYDROXY (VIT D DEFICIENCY, FRACTURES): Vit D, 25-Hydroxy: 77

## 2022-11-22 LAB — HEPATIC FUNCTION PANEL
ALT: 9 U/L — AB (ref 10–40)
AST: 15 (ref 14–40)
Alkaline Phosphatase: 71 (ref 25–125)

## 2022-11-22 LAB — CBC: RBC: 4.32 (ref 3.87–5.11)

## 2022-11-22 LAB — HEMOGLOBIN A1C: Hemoglobin A1C: 5.7

## 2022-11-23 ENCOUNTER — Ambulatory Visit: Payer: Medicare Other | Admitting: Student

## 2022-11-23 ENCOUNTER — Encounter: Payer: Self-pay | Admitting: Student

## 2022-11-23 VITALS — BP 134/78 | HR 80 | Temp 97.6°F | Ht 71.0 in | Wt 172.0 lb

## 2022-11-23 DIAGNOSIS — R062 Wheezing: Secondary | ICD-10-CM

## 2022-11-23 DIAGNOSIS — R053 Chronic cough: Secondary | ICD-10-CM

## 2022-11-23 MED ORDER — ALBUTEROL SULFATE HFA 108 (90 BASE) MCG/ACT IN AERS: 2.0000 | INHALATION_SPRAY | Freq: Four times a day (QID) | RESPIRATORY_TRACT | 3 refills | Status: DC | PRN

## 2022-11-23 NOTE — Patient Instructions (Addendum)
Advair is the new medication I would like you to start for your breathing. Please find additional information about the medication attached.   I have refilled the Albuterol prescription as well. My hope is that you will need this less since we are starting the Advair. Since you have not had breathing testing in >5 years, I have ordered spirometry and would like to start the Advair after receiving the results.   I would like to see you in a month since starting this new medication so we can follow up on how you are doing, as well as discuss the Beta Blocker Trial Dr. Caryl Comes has for you.  Please get your Tdap (tetanus vaccine) at your pharmacy when you pick up your medications.

## 2022-11-23 NOTE — Progress Notes (Unsigned)
Location:  [facility] clinic [facility].   Provider: Dr. Dewayne Shorter  Code Status: Patient not sure Goals of Care:     11/23/2022    1:14 PM  Advanced Directives  Does Patient Have a Medical Advance Directive? Yes  Type of Advance Directive Living will  Does patient want to make changes to medical advance directive? No - Patient declined    Chief Complaint  Patient presents with   Medical Management of Chronic Issues    Medical Management of Chronic Issues. 1 Month follow up Wheezing.    Quality Metric Gaps    To discuss need for Tdap and Covid.  AWV scheduled 12/20/22    HPI: Patient is a 87 y.o. male seen today for medical management of chronic diseases.    He has needed the albuterol 2-3x per day.   Cardiologist adjusted his metoprolol and he has been on the tartrate for 2 weeks.   He has never smoked. He was in the [employer] for 21 years and had no major exposures. No wars.  Past Medical History:  Diagnosis Date   Aortic atherosclerosis ([place])    Aortic root dilation ([place])    a.) TTE [id number]: Ao root measured 39 mm.   BPH (benign prostatic hyperplasia)    CHF (congestive heart failure) ([place])    Coronary artery disease    a.) remote PCI of the RI in 1997; stent (unknown type) placed.   Depression    Diverticulosis    Eczema    GERD (gastroesophageal reflux disease)    Hyperlipidemia    Hypertension    Inguinal hernia, left    Long term current use of anticoagulant    a.) apixaban   Nephrolithiasis    NSVT (nonsustained ventricular tachycardia) (HCC)    Osteoarthrosis    Paroxysmal A-fib ([place])    a.) CHA2DS2VASc = 5 (age x2, CHF, HTN, vascular disease history);  b.) rate/rhythm maintained on oral metoprolol succinate; chronically anticoagulated with apixaban   Pharyngeal dysphagia    Presence of permanent cardiac pacemaker 2007   a.) Medtronic device placed in [place] in 2007; b.) generator changed at [facility] on 11/18/2015   RBBB    SSS (sick sinus syndrome)  ([place]) 2007   a.) s/p PPM placement in [place] in 2007    Past Surgical History:  Procedure Laterality Date   BLEPHAROPLASTY Bilateral    CARDIAC CATHETERIZATION  09/25/1995   CARDIAC CATHETERIZATION  09/24/1998   CHOLECYSTECTOMY     COLONOSCOPY  09/24/2010   CORONARY ANGIOPLASTY  09/25/1995   ramus   ESOPHAGOGASTRODUODENOSCOPY N/A 02/11/2017   Procedure: ESOPHAGOGASTRODUODENOSCOPY (EGD);  Surgeon: Lollie Sails, MD;  Location[facility] ENDOSCOPY;  Service: Endoscopy;  Laterality: N/A;   INGUINAL HERNIA REPAIR Left 1977   INSERT / REPLACE / REMOVE PACEMAKER  12/23/2005   Medtronic [id number] H - placed in [place] Left 06/08/2022   Procedure: INSERTION OF MESH;  Surgeon: Ronny Bacon, MD;  Location: [facility] ORS;  Service: General;  Laterality: Left;   [place] N/A 11/18/2015   ROBOTIC ASSISTED LAPAROSCOPIC CHOLECYSTECTOMY  01/12/2022   TONSILLECTOMY     TOTAL HIP ARTHROPLASTY Right 2022   TOTAL SHOULDER REPLACEMENT     bilateral approx 2020 and 2021    Allergies  Allergen Reactions   Amoxicillin Rash and Itching   Bactrim [Sulfamethoxazole-Trimethoprim] Rash    Outpatient Encounter Medications as of 11/23/2022  Medication Sig   acetaminophen (TYLENOL) 500  MG tablet Take 500 mg by mouth every 8 (eight) hours as needed.   albuterol (VENTOLIN HFA) 108 (90 Base) MCG/ACT inhaler Inhale 2 puffs into the lungs every 6 (six) hours as needed for wheezing or shortness of breath.   apixaban (ELIQUIS) 5 MG TABS tablet Take 1 tablet (5 mg total) by mouth 2 (two) times daily.   atenolol (TENORMIN) 50 MG tablet Take 1 tablet (50 mg total) by mouth daily.   bisoprolol (ZEBETA) 5 MG tablet Take 1 tablet (5 mg total) by mouth daily.   [facility]S ZINC 50 MG TABS Take 50 mg by mouth daily.   finasteride (PROSCAR) 5 MG tablet Take 1 tablet (5 mg total) by mouth daily.   fluticasone (FLONASE) 50 MCG/ACT nasal spray Place 2 sprays into both  nostrils 2 (two) times daily.   furosemide (LASIX) 40 MG tablet Take by mouth. 3 tablets daily and may take 4 tablets if needed   ipratropium (ATROVENT HFA) 17 MCG/ACT inhaler Inhale 2 puffs into the lungs 4 (four) times daily.   lovastatin (MEVACOR) 20 MG tablet TAKE 1 TABLET AT BEDTIME   metoprolol succinate (TOPROL-XL) 50 MG 24 hr tablet TAKE 1 TABLET TWICE A DAY. TAKE WITH OR IMMEDIATELY FOLLOWING A MEAL   metoprolol tartrate (LOPRESSOR) 50 MG tablet Take 1 tablet (50 mg total) by mouth 2 (two) times daily.   nebivolol (BYSTOLIC) 5 MG tablet Take 1 tablet (5 mg total) by mouth daily.   nitroGLYCERIN (NITROSTAT) 0.4 MG SL tablet Place 1 tablet (0.4 mg total) under the tongue every 5 (five) minutes as needed for chest pain.   omeprazole (PRILOSEC) 20 MG capsule Take 20 mg by mouth as needed.   potassium chloride SA (KLOR-CON M) 20 MEQ tablet Take with 1 tablet daily alternating with 2 tablets daily every other day. Take with lasix   sertraline (ZOLOFT) 50 MG tablet Take 50 mg by mouth daily.   tamsulosin (FLOMAX) 0.4 MG CAPS capsule Take 2 capsules (0.8 mg total) by mouth daily after breakfast.   No facility-administered encounter medications on file as of 11/23/2022.    Review of Systems:  Review of Systems  Health Maintenance  Topic Date Due   Medicare Annual Wellness (AWV)  Never done   DTaP/Tdap/Td (1 - Tdap) Never done   COVID-19 Vaccine (8 - 2023-24 season) 09/28/2022   Pneumonia Vaccine 53+ Years old  Completed   INFLUENZA VACCINE  Completed   Zoster Vaccines- Shingrix  Completed   HPV VACCINES  Aged Out    Physical Exam: Vitals:   11/23/22 1309  BP: 134/78  Pulse: 80  Temp: 97.6 F (36.4 C)  SpO2: 97%  Weight: 172 lb (78 kg)  Height: '5\' 11"'$  (1.803 m)   Body mass index is 23.99 kg/m. Physical Exam  Labs reviewed: Basic Metabolic Panel: Recent Labs    01/11/22 0414 01/13/22 0752 05/31/22 1144 06/08/22 0903  NA 140 138 141 142  K 3.5 4.0 3.5 3.4*  CL 107  110 105 99  CO2 '27 24 28  '$ --   GLUCOSE 134* 98 123* 98  BUN '13 13 14 '$ <3*  CREATININE 0.79 0.79 0.94 0.90  CALCIUM 8.7* 8.1* 9.1  --   MG  --  1.8  --   --    Liver Function Tests: Recent Labs    01/10/22 2039 01/11/22 0414 05/31/22 1144  AST '16 17 20  '$ ALT '9 10 13  '$ ALKPHOS 76 64 59  BILITOT 1.1 1.1 1.4*  PROT  7.2 6.2* 6.9  ALBUMIN 4.2 3.6 4.1   Recent Labs    01/10/22 2039  LIPASE 29   No results for input(s): "AMMONIA" in the last 8760 hours. CBC: Recent Labs    01/11/22 0414 01/13/22 0752 05/31/22 1144 06/08/22 0903  WBC 8.0 6.7 5.6  --   HGB 13.1 11.4* 14.2 13.9  HCT 39.3 34.8* 42.8 41.0  MCV 96.6 99.1 98.2  --   PLT 140* 137* 156  --    Lipid Panel: No results for input(s): "CHOL", "HDL", "LDLCALC", "TRIG", "CHOLHDL", "LDLDIRECT" in the last 8760 hours. No results found for: "HGBA1C"  Procedures since last visit: CUP [place]  Result Date: 11/05/2022 Scheduled remote reviewed. Normal device function.  22 NSVT, available EGM show <20 beats of NSVT Hx of PAF, longest duration 14hrs, overall controlled rates, burden 1.7%, Eliquis Next remote 91 days. LA   Assessment/Plan Chronic cough - Plan: Spirometry with Graph  Wheezing - Plan: albuterol (VENTOLIN HFA) 108 (90 Base) MCG/ACT inhaler, Spirometry with Graph Last sp[facility]rometry in 2017 [facility] was within normal limits. Now having chronic cough, sputum production, and improved symptoms with PRN albuteral TID. Discussed concern for underlying lung pathology. Followed by cardiology. Plan to order spirometry and initiate therapy with advair after results are finalized.   Follow up in 1 month.     Labs/tests ordered:  * No order type specified * Next appt:  12/20/2022

## 2022-11-26 ENCOUNTER — Other Ambulatory Visit: Payer: Self-pay | Admitting: Student

## 2022-11-26 DIAGNOSIS — R062 Wheezing: Secondary | ICD-10-CM

## 2022-11-26 DIAGNOSIS — R053 Chronic cough: Secondary | ICD-10-CM

## 2022-11-30 ENCOUNTER — Telehealth: Payer: Self-pay | Admitting: Student

## 2022-11-30 ENCOUNTER — Encounter: Payer: Self-pay | Admitting: Internal Medicine

## 2022-11-30 DIAGNOSIS — I509 Heart failure, unspecified: Secondary | ICD-10-CM

## 2022-11-30 MED ORDER — POTASSIUM CHLORIDE CRYS ER 20 MEQ PO TBCR: EXTENDED_RELEASE_TABLET | ORAL | 2 refills | Status: DC

## 2022-11-30 NOTE — Telephone Encounter (Signed)
-----   Message from Carroll Kinds, [place] sent at 11/30/2022  9:59 AM EST ----- Abstracted 11/26/2022

## 2022-11-30 NOTE — Telephone Encounter (Signed)
Wife Notified and agreed. 

## 2022-11-30 NOTE — Telephone Encounter (Signed)
Please call Kyle Morales to inform him that his labs were all normal except for his potassium. This was low, likely due to chronic use of lasix. He should take two tablets of his current prescription daily. Perhaps this dosing will need to be increased long term. We can recheck his potassium levels in ~1 week. Please add the order for March 18 at 7:30 AM. I will send rx to his pharmacy.

## 2022-12-04 NOTE — Progress Notes (Signed)
Remote pacemaker transmission.   

## 2022-12-10 LAB — BASIC METABOLIC PANEL: Potassium: 3.8 mEq/L (ref 3.5–5.1)

## 2022-12-13 ENCOUNTER — Ambulatory Visit: Payer: Medicare Other | Attending: Student

## 2022-12-13 DIAGNOSIS — R053 Chronic cough: Secondary | ICD-10-CM

## 2022-12-13 DIAGNOSIS — R062 Wheezing: Secondary | ICD-10-CM

## 2022-12-13 LAB — PULMONARY FUNCTION TEST ARMC ONLY
DL/VA % pred: 58 %
DL/VA: 2.22 ml/min/mmHg/L
DLCO unc % pred: 49 %
DLCO unc: 12.14 ml/min/mmHg
FEF 25-75 Post: 2.64 L/sec
FEF 25-75 Pre: 2.05 L/sec
FEF2575-%Change-Post: 28 %
FEF2575-%Pred-Post: 141 %
FEF2575-%Pred-Pre: 109 %
FEV1-%Change-Post: 6 %
FEV1-%Pred-Post: 84 %
FEV1-%Pred-Pre: 79 %
FEV1-Post: 2.4 L
FEV1-Pre: 2.24 L
FEV1FVC-%Change-Post: -1 %
FEV1FVC-%Pred-Pre: 109 %
FEV6-%Change-Post: 8 %
FEV6-%Pred-Post: 83 %
FEV6-%Pred-Pre: 76 %
FEV6-Post: 3.11 L
FEV6-Pre: 2.87 L
FEV6FVC-%Change-Post: 0 %
FEV6FVC-%Pred-Post: 107 %
FEV6FVC-%Pred-Pre: 106 %
FVC-%Change-Post: 7 %
FVC-%Pred-Post: 77 %
FVC-%Pred-Pre: 71 %
FVC-Post: 3.12 L
FVC-Pre: 2.89 L
Post FEV1/FVC ratio: 77 %
Post FEV6/FVC ratio: 100 %
Pre FEV1/FVC ratio: 78 %
Pre FEV6/FVC Ratio: 99 %
RV % pred: 87 %
RV: 2.44 L
TLC % pred: 77 %
TLC: 5.65 L

## 2022-12-13 MED ORDER — ALBUTEROL SULFATE (2.5 MG/3ML) 0.083% IN NEBU
2.5000 mg | INHALATION_SOLUTION | Freq: Once | RESPIRATORY_TRACT | Status: AC
  Administered 2022-12-13: 2.5 mg via RESPIRATORY_TRACT

## 2022-12-20 ENCOUNTER — Ambulatory Visit (INDEPENDENT_AMBULATORY_CARE_PROVIDER_SITE_OTHER): Payer: Medicare Other | Admitting: Nurse Practitioner

## 2022-12-20 ENCOUNTER — Encounter: Payer: Self-pay | Admitting: Nurse Practitioner

## 2022-12-20 ENCOUNTER — Telehealth: Payer: Self-pay

## 2022-12-20 DIAGNOSIS — Z0000 Encounter for general adult medical examination without abnormal findings: Secondary | ICD-10-CM

## 2022-12-20 NOTE — Progress Notes (Signed)
   This service is provided via telemedicine  No vital signs collected/recorded due to the encounter was a telemedicine visit.   Location of patient (ex: home, work):  Home  Patient consents to a telephone visit: Yes, see telephone visit dated 12/20/22   Location of the provider (ex: office, home): [place], [place] Location   Name of any referring provider:  N/A  Names of all persons participating in the telemedicine service and their role in the encounter:  S.Chrae B/CMA, Sherrie Mustache, NP, and Patient   Time spent on call:  13 min with medical assistant

## 2022-12-20 NOTE — Patient Instructions (Signed)
[name] , Thank you for taking time to come for your Medicare Wellness Visit. I appreciate your ongoing commitment to your health goals. Please review the following plan we discussed and let me know if I can assist you in the future.   Screening recommendations/referrals: Colonoscopy aged out Recommended yearly ophthalmology/optometry visit for glaucoma screening and checkup Recommended yearly dental visit for hygiene and checkup  Vaccinations: Influenza vaccine due annually in September/October Pneumococcal vaccine up to date Tdap vaccine DUE- recommend to get at your local pharmacy Shingles vaccine up to date    Advanced directives: on file  Conditions/risks identified: advanced age, heart disease  Next appointment: yearly   Preventive Care 6 Years and Older, Male Preventive care refers to lifestyle choices and visits with your health care provider that can promote health and wellness. What does preventive care include? A yearly physical exam. This is also called an annual well check. Dental exams once or twice a year. Routine eye exams. Ask your health care provider how often you should have your eyes checked. Personal lifestyle choices, including: Daily care of your teeth and gums. Regular physical activity. Eating a healthy diet. Avoiding tobacco and drug use. Limiting alcohol use. Practicing safe sex. Taking low doses of aspirin every day. Taking vitamin and mineral supplements as recommended by your health care provider. What happens during an annual well check? The services and screenings done by your health care provider during your annual well check will depend on your age, overall health, lifestyle risk factors, and family history of disease. Counseling  Your health care provider may ask you questions about your: Alcohol use. Tobacco use. Drug use. Emotional well-being. Home and relationship well-being. Sexual activity. Eating habits. History of  falls. Memory and ability to understand (cognition). Work and work Statistician. Screening  You may have the following tests or measurements: Height, weight, and BMI. Blood pressure. Lipid and cholesterol levels. These may be checked every 5 years, or more frequently if you are over 26 years old. Skin check. Lung cancer screening. You may have this screening every year starting at age 52 if you have a 30-pack-year history of smoking and currently smoke or have quit within the past 15 years. Fecal occult blood test (FOBT) of the stool. You may have this test every year starting at age 26. Flexible sigmoidoscopy or colonoscopy. You may have a sigmoidoscopy every 5 years or a colonoscopy every 10 years starting at age 26. Prostate cancer screening. Recommendations will vary depending on your family history and other risks. Hepatitis C blood test. Hepatitis B blood test. Sexually transmitted disease (STD) testing. Diabetes screening. This is done by checking your blood sugar (glucose) after you have not eaten for a while (fasting). You may have this done every 1-3 years. Abdominal aortic aneurysm (AAA) screening. You may need this if you are a current or former smoker. Osteoporosis. You may be screened starting at age 41 if you are at high risk. Talk with your health care provider about your test results, treatment options, and if necessary, the need for more tests. Vaccines  Your health care provider may recommend certain vaccines, such as: Influenza vaccine. This is recommended every year. Tetanus, diphtheria, and acellular pertussis (Tdap, Td) vaccine. You may need a Td booster every 10 years. Zoster vaccine. You may need this after age 90. Pneumococcal 13-valent conjugate (PCV13) vaccine. One dose is recommended after age 69. Pneumococcal polysaccharide (PPSV23) vaccine. One dose is recommended after age 66. Talk to your health  care provider about which screenings and vaccines you need and  how often you need them. This information is not intended to replace advice given to you by your health care provider. Make sure you discuss any questions you have with your health care provider. Document Released: 10/07/2015 Document Revised: 05/30/2016 Document Reviewed: 07/12/2015 Elsevier Interactive Patient Education  2017 [place] Prevention in the Home Falls can cause injuries. They can happen to people of all ages. There are many things you can do to make your home safe and to help prevent falls. What can I do on the outside of my home? Regularly fix the edges of walkways and driveways and fix any cracks. Remove anything that might make you trip as you walk through a door, such as a raised step or threshold. Trim any bushes or trees on the path to your home. Use bright outdoor lighting. Clear any walking paths of anything that might make someone trip, such as rocks or tools. Regularly check to see if handrails are loose or broken. Make sure that both sides of any steps have handrails. Any raised decks and porches should have guardrails on the edges. Have any leaves, snow, or ice cleared regularly. Use sand or salt on walking paths during winter. Clean up any spills in your garage right away. This includes oil or grease spills. What can I do in the bathroom? Use night lights. Install grab bars by the toilet and in the tub and shower. Do not use towel bars as grab bars. Use non-skid mats or decals in the tub or shower. If you need to sit down in the shower, use a plastic, non-slip stool. Keep the floor dry. Clean up any water that spills on the floor as soon as it happens. Remove soap buildup in the tub or shower regularly. Attach bath mats securely with double-sided non-slip rug tape. Do not have throw rugs and other things on the floor that can make you trip. What can I do in the bedroom? Use night lights. Make sure that you have a light by your bed that is easy to  reach. Do not use any sheets or blankets that are too big for your bed. They should not hang down onto the floor. Have a firm chair that has side arms. You can use this for support while you get dressed. Do not have throw rugs and other things on the floor that can make you trip. What can I do in the kitchen? Clean up any spills right away. Avoid walking on wet floors. Keep items that you use a lot in easy-to-reach places. If you need to reach something above you, use a strong step stool that has a grab bar. Keep electrical cords out of the way. Do not use floor polish or wax that makes floors slippery. If you must use wax, use non-skid floor wax. Do not have throw rugs and other things on the floor that can make you trip. What can I do with my stairs? Do not leave any items on the stairs. Make sure that there are handrails on both sides of the stairs and use them. Fix handrails that are broken or loose. Make sure that handrails are as long as the stairways. Check any carpeting to make sure that it is firmly attached to the stairs. Fix any carpet that is loose or worn. Avoid having throw rugs at the top or bottom of the stairs. If you do have throw rugs, attach them to  the floor with carpet tape. Make sure that you have a light switch at the top of the stairs and the bottom of the stairs. If you do not have them, ask someone to add them for you. What else can I do to help prevent falls? Wear shoes that: Do not have high heels. Have rubber bottoms. Are comfortable and fit you well. Are closed at the toe. Do not wear sandals. If you use a stepladder: Make sure that it is fully opened. Do not climb a closed stepladder. Make sure that both sides of the stepladder are locked into place. Ask someone to hold it for you, if possible. Clearly mark and make sure that you can see: Any grab bars or handrails. First and last steps. Where the edge of each step is. Use tools that help you move  around (mobility aids) if they are needed. These include: Canes. Walkers. Scooters. Crutches. Turn on the lights when you go into a dark area. Replace any light bulbs as soon as they burn out. Set up your furniture so you have a clear path. Avoid moving your furniture around. If any of your floors are uneven, fix them. If there are any pets around you, be aware of where they are. Review your medicines with your doctor. Some medicines can make you feel dizzy. This can increase your chance of falling. Ask your doctor what other things that you can do to help prevent falls. This information is not intended to replace advice given to you by your health care provider. Make sure you discuss any questions you have with your health care provider. Document Released: 07/07/2009 Document Revised: 02/16/2016 Document Reviewed: 10/15/2014 Elsevier Interactive Patient Education  2017 [employer].

## 2022-12-20 NOTE — Telephone Encounter (Signed)
[name], Kyle Morales are scheduled for a virtual visit with your provider today.    Just as we do with appointments in the office, we must obtain your consent to participate.  Your consent will be active for this visit and any virtual visit you may have with one of our providers in the next 365 days.    If you have a MyChart account, I can also send a copy of this consent to you electronically.  All virtual visits are billed to your insurance company just like a traditional visit in the office.  As this is a virtual visit, video technology does not allow for your provider to perform a traditional examination.  This may limit your provider's ability to fully assess your condition.  If your provider identifies any concerns that need to be evaluated in person or the need to arrange testing such as labs, EKG, etc, we will make arrangements to do so.    Although advances in technology are sophisticated, we cannot ensure that it will always work on either your end or our end.  If the connection with a video visit is poor, we may have to switch to a telephone visit.  With either a video or telephone visit, we are not always able to ensure that we have a secure connection.   I need to obtain your verbal consent now.   Are you willing to proceed with your visit today?   Kyle Morales has provided verbal consent on 12/20/2022 for a virtual visit (video or telephone).   Leigh Aurora Dund[place]ach, [place] 12/20/2022  9:59 AM

## 2022-12-20 NOTE — Progress Notes (Signed)
Subjective:   Kyle Morales is a 87 y.o. male who presents for Medicare Annual/Subsequent preventive examination.  Review of Systems     Cardiac Risk Factors include: advanced age (>43men, >90 women);hypertension;sedentary lifestyle     Objective:    There were no vitals filed for this visit. There is no height or weight on file to calculate BMI.     12/20/2022    9:14 AM 11/23/2022    1:14 PM 10/22/2022    1:32 PM 06/08/2022    8:45 AM 05/30/2022    8:48 AM 01/12/2022    9:23 AM 01/11/2022    1:20 AM  Advanced Directives  Does Patient Have a Medical Advance Directive? Yes Yes Yes Yes Yes No No  Type of Advance Directive Living will Living will Living will [place] of [place];Living will    Does patient want to make changes to medical advance directive? No - Patient declined No - Patient declined No - Patient declined No - Guardian declined     Copy of [place] in Chart?    No - copy requested No - copy requested    Would patient like information on creating a medical advance directive?      No - Patient declined No - Patient declined    Current Medications (verified) Outpatient Encounter Medications as of 12/20/2022  Medication Sig   acetaminophen (TYLENOL) 500 MG tablet Take 500 mg by mouth every 8 (eight) hours as needed.   albuterol (VENTOLIN HFA) 108 (90 Base) MCG/ACT inhaler Inhale 2 puffs into the lungs every 6 (six) hours as needed for wheezing or shortness of breath.   apixaban (ELIQUIS) 5 MG TABS tablet Take 1 tablet (5 mg total) by mouth 2 (two) times daily.   atenolol (TENORMIN) 50 MG tablet Take 1 tablet (50 mg total) by mouth daily.   bisoprolol (ZEBETA) 5 MG tablet Take 1 tablet (5 mg total) by mouth daily.   [facility] ZINC 50 MG TABS Take 50 mg by mouth daily.   finasteride (PROSCAR) 5 MG tablet Take 1 tablet (5 mg total) by mouth daily.   fluticasone (FLONASE) 50 MCG/ACT nasal spray Place 2 sprays into both  nostrils 2 (two) times daily.   furosemide (LASIX) 40 MG tablet Take by mouth. 3 tablets daily and may take 4 tablets if needed   ipratropium (ATROVENT HFA) 17 MCG/ACT inhaler Inhale 2 puffs into the lungs 4 (four) times daily.   lovastatin (MEVACOR) 20 MG tablet TAKE 1 TABLET AT BEDTIME   metoprolol tartrate (LOPRESSOR) 50 MG tablet Take 1 tablet (50 mg total) by mouth 2 (two) times daily.   nebivolol (BYSTOLIC) 5 MG tablet Take 1 tablet (5 mg total) by mouth daily.   nitroGLYCERIN (NITROSTAT) 0.4 MG SL tablet Place 1 tablet (0.4 mg total) under the tongue every 5 (five) minutes as needed for chest pain.   omeprazole (PRILOSEC) 20 MG capsule Take 20 mg by mouth as needed.   potassium chloride SA (KLOR-CON M) 20 MEQ tablet Take 2 tablets daily every other day. Take with lasix   sertraline (ZOLOFT) 50 MG tablet Take 50 mg by mouth daily.   tamsulosin (FLOMAX) 0.4 MG CAPS capsule Take 2 capsules (0.8 mg total) by mouth daily after breakfast.   No facility-administered encounter medications on file as of 12/20/2022.    Allergies (verified) Amoxicillin and Bactrim [sulfamethoxazole-trimethoprim]   History: Past Medical History:  Diagnosis Date   Aortic atherosclerosis ([place])  Aortic root dilation ([place])    a.) TTE [id number]: Ao root measured 39 mm.   BPH (benign prostatic hyperplasia)    CHF (congestive heart failure) ([place])    Coronary artery disease    a.) remote PCI of the RI in 1997; stent (unknown type) placed.   Depression    Diverticulosis    Eczema    GERD (gastroesophageal reflux disease)    Hyperlipidemia    Hypertension    Inguinal hernia, left    Long term current use of anticoagulant    a.) apixaban   Nephrolithiasis    NSVT (nonsustained ventricular tachycardia) ([facility])    Osteoarthrosis    Paroxysmal A-fib ([place])    a.) CHA2DS2VASc = 5 (age x2, CHF, HTN, vascular disease history);  b.) rate/rhythm maintained on oral metoprolol succinate; chronically anticoagulated with  apixaban   Pharyngeal dysphagia    Presence of permanent cardiac pacemaker 2007   a.) Medtronic device placed in [place] in 2007; b.) generator changed at [facility] on 11/18/2015   RBBB    SSS (sick sinus syndrome) ([place]) 2007   a.) s/p PPM placement in [place] in 2007   Past Surgical History:  Procedure Laterality Date   BLEPHAROPLASTY Bilateral    CARDIAC CATHETERIZATION  09/25/1995   CARDIAC CATHETERIZATION  09/24/1998   CHOLECYSTECTOMY     COLONOSCOPY  09/24/2010   CORONARY ANGIOPLASTY  09/25/1995   ramus   ESOPHAGOGASTRODUODENOSCOPY N/A 02/11/2017   Procedure: ESOPHAGOGASTRODUODENOSCOPY (EGD);  Surgeon: Lollie Sails, MD;  [facility];  Service: Endoscopy;  Laterality: N/A;   INGUINAL HERNIA REPAIR Left 1977   INSERT / REPLACE / REMOVE PACEMAKER  12/23/2005   Medtronic [id number] H - placed in [place] Left 06/08/2022   Procedure: INSERTION OF MESH;  Surgeon: Ronny Bacon, MD;  Location: [facility] ORS;  Service: General;  Laterality: Left;   [place] N/A 11/18/2015   ROBOTIC ASSISTED LAPAROSCOPIC CHOLECYSTECTOMY  01/12/2022   TONSILLECTOMY     TOTAL HIP ARTHROPLASTY Right 2022   TOTAL SHOULDER REPLACEMENT     bilateral approx 2020 and 2021   Family History  Problem Relation Age of Onset   Heart disease Mother    Hypertension Mother    Hyperlipidemia Mother    Heart attack Father    Heart disease Brother    Social History   Socioeconomic History   Marital status: Married    Spouse name: Not on file   Number of children: Not on file   Years of education: Not on file   Highest education level: Not on file  Occupational History   Not on file  Tobacco Use   Smoking status: Never   Smokeless tobacco: Never  Vaping Use   Vaping Use: Never used  Substance and Sexual Activity   Alcohol use: Yes    Comment: 1 beer monthly, mixed drink 2-3 times yearly   Drug use: No   Sexual activity: Not on  file  Other Topics Concern   Not on file  Social History Narrative   Not on file   Social Determinants of Health   Financial Resource Strain: Not on file  Food Insecurity: Not on file  Transportation Needs: Not on file  Physical Activity: Not on file  Stress: Not on file  Social Connections: Not on file    Tobacco Counseling Counseling given: Not Answered   Clinical Intake:  Diabetic?no         Activities of Daily Living    12/20/2022    9:52 AM 05/30/2022    8:53 AM  In your present state of health, do you have any difficulty performing the following activities:  Hearing? 1 0  Comment  hearing aids  Vision? 0 0  Difficulty concentrating or making decisions? 0 0  Walking or climbing stairs? 0   Dressing or bathing? 0 0  Doing errands, shopping? 0   Preparing Food and eating ? N   Using the Toilet? N   In the past six months, have you accidently leaked urine? N   Do you have problems with loss of bowel control? N   Managing your Medications? N   Managing your Finances? N   Housekeeping or managing your Housekeeping? N     Patient Care Team: Dewayne Shorter, MD as PCP - General (Family Medicine) Rockey Situ Kathlene November, MD as PCP - Cardiology (Cardiology) Minna Merritts, MD as Consulting Physician (Cardiology) Heber Rantoul, MD as Referring Physician (Otolaryngology) Billey Co, MD as Consulting Physician (Urology) Jamelle Rushing, MD as Referring Physician (Dermatology)  Indicate any recent Medical Services you may have received from other than [facility] providers in the past year (date may be approximate).     Assessment:   This is a routine wellness examination for Kyle Morales.  Hearing/Vision screen Hearing Screening - Comments:: Patient wears hearing aids  Vision Screening - Comments:: Last eye exam less than 12 months ago with [facility], [place]   Dietary issues and exercise activities discussed: Current Exercise  Habits: The patient does not participate in regular exercise at present   Goals Addressed   None    Depression Screen    12/20/2022    9:16 AM 10/22/2022    1:33 PM  PHQ 2/9 Scores  PHQ - 2 Score 0 0    Fall Risk    12/20/2022    9:15 AM 10/22/2022    1:33 PM 05/15/2022   11:05 AM 02/01/2022   10:41 AM  [place] in the past year? 0 0 0 0  Number falls in past yr: 0 0    Injury with Fall? 0 0    Risk for fall due to : No Fall Risks     Follow up Falls evaluation completed       [place]:  Any stairs in or around the home? Yes  If so, are there any without handrails? No  Home free of loose throw rugs in walkways, pet beds, electrical cords, etc? Yes  Adequate lighting in your home to reduce risk of falls? Yes   ASSISTIVE DEVICES UTILIZED TO PREVENT FALLS:  Life alert? No  Use of a cane, walker or w/c? Yes  Grab bars in the bathroom? Yes  Shower chair or bench in shower? Yes  Elevated toilet seat or a handicapped toilet? Yes   TIMED UP AND GO:  Was the test performed? No .    Cognitive Function:        12/20/2022    9:16 AM  6CIT Screen  What Year? 0 points  What month? 0 points  What time? 0 points  Count back from 20 0 points  Months in reverse 0 points  Repeat phrase 2 points  Total Score 2 points    Immunizations Immunization History  Administered Date(s) Administered   Covid-19, Mrna,Vaccine(Spikevax)1yrs and older 08/03/2022  Fluad Quad(high Dose 65+) 07/25/2021, 07/17/2022   H1N1 09/02/2008   Influenza, Seasonal, Injecte, Preservative Fre 08/01/2006, 09/11/2007, 06/10/2008, 07/28/2009, 06/08/2010, 07/12/2011, 08/05/2012, 07/07/2013   Influenza,inj,Quad PF,6+ Mos 08/05/2012, 06/14/2015, 06/22/2016, 08/05/2017, 07/01/2018, 06/09/2019, 06/02/2020   Influenza-Unspecified 06/13/2014, 07/25/2021, 07/17/2022   Moderna Covid-19 Vaccine Bivalent Booster 94yrs & up 06/15/2021, 02/20/2022   Moderna Sars-Covid-2  Vaccination 11/06/2019, 12/04/2019, 08/09/2020, 02/09/2021   Pneumococcal Conjugate-13 06/14/2015   Pneumococcal Polysaccharide-23 08/01/2006, 08/05/2012   RSV,unspecified 07/28/2022   Respiratory Syncytial Virus Vaccine,Recomb Aduvanted(Arexvy) 07/28/2022   Zoster Recombinat (Shingrix) 08/07/2019, 10/09/2019    TDAP status: Up to date  Flu Vaccine status: Up to date  Pneumococcal vaccine status: Up to date  Covid-19 vaccine status: Information provided on how to obtain vaccines.   Qualifies for Shingles Vaccine? Yes   Zostavax completed No   Shingrix Completed?: Yes  Screening Tests Health Maintenance  Topic Date Due   DTaP/Tdap/Td (1 - Tdap) Never done   COVID-19 Vaccine (8 - 2023-24 season) 09/28/2022   Medicare Annual Wellness (AWV)  12/20/2023   Pneumonia Vaccine 41+ Years old  Completed   INFLUENZA VACCINE  Completed   Zoster Vaccines- Shingrix  Completed   HPV VACCINES  Aged Out    Health Maintenance  Health Maintenance Due  Topic Date Due   DTaP/Tdap/Td (1 - Tdap) Never done   COVID-19 Vaccine (8 - 2023-24 season) 09/28/2022    Colorectal cancer screening: No longer required.   Lung Cancer Screening: (Low Dose CT Chest recommended if Age 29-80 years, 30 pack-year currently smoking OR have quit w/in 15years.) does not qualify.   Lung Cancer Screening Referral: na  Additional Screening:  Hepatitis C Screening: does not qualify  Vision Screening: Recommended annual ophthalmology exams for early detection of glaucoma and other disorders of the eye. Is the patient up to date with their annual eye exam?  Yes  Who is the provider or what is the name of the office in which the patient attends annual eye exams? [facility] If pt is not established with a provider, would they like to be referred to a provider to establish care? No .   Dental Screening: Recommended annual dental exams for proper oral hygiene  Community Resource Referral / Chronic Care  Management: CRR required this visit?  No   CCM required this visit?  No      Plan:     I have personally reviewed and noted the following in the patient's chart:   Medical and social history Use of alcohol, tobacco or illicit drugs  Current medications and supplements including opioid prescriptions. Patient is not currently taking opioid prescriptions. Functional ability and status Nutritional status Physical activity Advanced directives List of other physicians Hospitalizations, surgeries, and ER visits in previous 12 months Vitals Screenings to include cognitive, depression, and falls Referrals and appointments  In addition, I have reviewed and discussed with patient certain preventive protocols, quality metrics, and best practice recommendations. A written personalized care plan for preventive services as well as general preventive health recommendations were provided to patient.     Lauree Chandler, NP   12/20/2022   Virtual Visit via Video Note  I connected with Rolly Pancake on 12/20/22 at  9:40 AM EDT by a video enabled telemedicine application and verified that I am speaking with the correct person using two identifiers.  Location: Patient: home Provider: [facility]    I discussed the limitations of evaluation and management by telemedicine and the availability of in  person appointments. The patient expressed understanding and agreed to proceed.    I discussed the assessment and treatment plan with the patient. The patient was provided an opportunity to ask questions and all were answered. The patient agreed with the plan and demonstrated an understanding of the instructions.   The patient was advised to call back or seek an in-person evaluation if the symptoms worsen or if the condition fails to improve as anticipated.  I provided 15 minutes of non-face-to-face time during this encounter.  Carlos American. Dewaine Oats, AGNP Avs printed and mailed.

## 2022-12-26 ENCOUNTER — Encounter: Payer: Self-pay | Admitting: Student

## 2022-12-26 ENCOUNTER — Ambulatory Visit: Payer: Medicare Other | Admitting: Student

## 2022-12-26 VITALS — BP 124/72 | HR 84 | Temp 98.1°F | Ht 71.0 in | Wt 174.0 lb

## 2022-12-26 DIAGNOSIS — J449 Chronic obstructive pulmonary disease, unspecified: Secondary | ICD-10-CM | POA: Diagnosis not present

## 2022-12-26 DIAGNOSIS — M545 Low back pain, unspecified: Secondary | ICD-10-CM

## 2022-12-26 DIAGNOSIS — D489 Neoplasm of uncertain behavior, unspecified: Secondary | ICD-10-CM | POA: Diagnosis not present

## 2022-12-26 DIAGNOSIS — F321 Major depressive disorder, single episode, moderate: Secondary | ICD-10-CM | POA: Diagnosis not present

## 2022-12-26 DIAGNOSIS — G8929 Other chronic pain: Secondary | ICD-10-CM

## 2022-12-26 MED ORDER — TRELEGY ELLIPTA 100-62.5-25 MCG/ACT IN AEPB: 1.0000 | INHALATION_SPRAY | Freq: Every day | RESPIRATORY_TRACT | 4 refills | Status: AC

## 2022-12-26 MED ORDER — PREDNISONE 20 MG PO TABS
40.0000 mg | ORAL_TABLET | Freq: Every day | ORAL | 0 refills | Status: DC
Start: 2022-12-26 — End: 2023-01-21

## 2022-12-26 MED ORDER — LIDOCAINE 5 % EX PTCH
1.0000 | MEDICATED_PATCH | CUTANEOUS | 0 refills | Status: DC
Start: 2022-12-26 — End: 2024-06-26

## 2022-12-26 MED ORDER — TRELEGY ELLIPTA 100-62.5-25 MCG/ACT IN AEPB: 1.0000 | INHALATION_SPRAY | Freq: Every day | RESPIRATORY_TRACT | 11 refills | Status: DC

## 2022-12-26 MED ORDER — DICLOFENAC SODIUM 75 MG PO TBEC
75.0000 mg | DELAYED_RELEASE_TABLET | Freq: Two times a day (BID) | ORAL | 0 refills | Status: DC
Start: 2022-12-26 — End: 2023-09-05

## 2022-12-26 MED ORDER — SERTRALINE HCL 100 MG PO TABS: 100.0000 mg | ORAL_TABLET | Freq: Every day | ORAL | 3 refills | Status: DC

## 2022-12-26 MED ORDER — FUROSEMIDE 40 MG PO TABS: 120.0000 mg | ORAL_TABLET | Freq: Every day | ORAL | 3 refills | Status: DC

## 2022-12-26 MED ORDER — SERTRALINE HCL 100 MG PO TABS
100.0000 mg | ORAL_TABLET | Freq: Every day | ORAL | 3 refills | Status: DC
Start: 2022-12-26 — End: 2022-12-26

## 2022-12-26 NOTE — Patient Instructions (Addendum)
For the next week, please take 1.5 tablets of zoloft.   For your back pain, consider purchasing lidocaine patch or voltaren gel-- do NOT use these together, they must be separate.   Please take Prednisone 40 mg daily for the next 5 days.   I [facility] a [facility]   Please continue to use the albuterol as needed.   I am writing a consultation for dermatology on campus.   Please take omeprazole every other day for the next week then stop taking it.

## 2022-12-26 NOTE — Progress Notes (Signed)
Location:  [facility] clinic [facility].   Provider: Dr. Dewayne Shorter  Code Status: Full Code Goals of Care:     12/26/2022    1:08 PM  Advanced Directives  Does Patient Have a Medical Advance Directive? Yes  Type of Advance Directive Living will  Does patient want to make changes to medical advance directive? No - Patient declined     Chief Complaint  Patient presents with  . Medical Management of Chronic Issues    Medical Management of Chronic Issues. 1 Month Follow up  . Quality Metric Gaps    To discuss need for Tdap and Covid Vaccine.     HPI: Patient is a 87 y.o. male seen today for medical management of chronic diseases.    He has a history of scoliosis, and 30+ years of second hand smoke exposure with his parents, and 2 ex wives. Discussed possibility of more testing however patient declined.    He has felt very exhausted and that's been frustrating for him. He is napping 1-2x per day. He has had a productive cough that is pale yellow for a while.   Skin concerns-- has a couple of growths on the side of his face.   Chronic back pain -- he ha dinjections and then he had nerve ablation, but it's stil lhurting. Injections only helped for a couple of days.   Hx of nasal polyps - 20% chance it could regrow even though it was benign-- next visit is in August 1.   He quilts by hand -- he hasn't touched a quilt in 2 months because he has had low interest. It bothers him that he can't enjoy what he likes.  Past Medical History:  Diagnosis Date  . Aortic atherosclerosis   . Aortic root dilation    a.) TTE [id number]: Ao root measured 39 mm.  Marland Kitchen BPH (benign prostatic hyperplasia)   . CHF (congestive heart failure)   . Coronary artery disease    a.) remote PCI of the RI in 1997; stent (unknown type) placed.  . Depression   . Diverticulosis   . Eczema   . GERD (gastroesophageal reflux disease)   . Hyperlipidemia   . Hypertension   . Inguinal hernia, left   . Long term  current use of anticoagulant    a.) apixaban  . Nephrolithiasis   . NSVT (nonsustained ventricular tachycardia)   . Osteoarthrosis   . Paroxysmal A-fib    a.) CHA2DS2VASc = 5 (age x2, CHF, HTN, vascular disease history);  b.) rate/rhythm maintained on oral metoprolol succinate; chronically anticoagulated with apixaban  . Pharyngeal dysphagia   . Presence of permanent cardiac pacemaker 2007   a.) Medtronic device placed in [place] in 2007; b.) generator changed at [facility] on 11/18/2015  . RBBB   . SSS (sick sinus syndrome) 2007   a.) s/p PPM placement in [place] in 2007    Past Surgical History:  Procedure Laterality Date  . BLEPHAROPLASTY Bilateral   . CARDIAC CATHETERIZATION  09/25/1995  . CARDIAC CATHETERIZATION  09/24/1998  . CHOLECYSTECTOMY    . COLONOSCOPY  09/24/2010  . CORONARY ANGIOPLASTY  09/25/1995   ramus  . ESOPHAGOGASTRODUODENOSCOPY N/A 02/11/2017   Procedure: ESOPHAGOGASTRODUODENOSCOPY (EGD);  Surgeon: Lollie Sails, MD;  Location: [facility] ENDOSCOPY;  Service: Endoscopy;  Laterality: N/A;  . INGUINAL HERNIA REPAIR Left 1977  . INSERT / REPLACE / REMOVE PACEMAKER  12/23/2005   Medtronic [id number] - placed in [place]  . INSERTION OF MESH Left 06/08/2022  Procedure: INSERTION OF MESH;  Surgeon: Ronny Bacon, MD;  Location: [facility] ORS;  Service: General;  Laterality: Left;  Marland Kitchen [place]  . PACEMAKER GENERATOR CHANGE N/A 11/18/2015  . ROBOTIC ASSISTED LAPAROSCOPIC CHOLECYSTECTOMY  01/12/2022  . TONSILLECTOMY    . TOTAL HIP ARTHROPLASTY Right 2022  . TOTAL SHOULDER REPLACEMENT     bilateral approx 2020 and 2021    Allergies  Allergen Reactions  . Amoxicillin Rash and Itching  . Bactrim [Sulfamethoxazole-Trimethoprim] Rash    Outpatient Encounter Medications as of 12/26/2022  Medication Sig  . acetaminophen (TYLENOL) 500 MG tablet Take 500 mg by mouth every 8 (eight) hours as needed.  Marland Kitchen albuterol (VENTOLIN HFA) 108 (90 Base) MCG/ACT inhaler  Inhale 2 puffs into the lungs every 6 (six) hours as needed for wheezing or shortness of breath.  Marland Kitchen apixaban (ELIQUIS) 5 MG TABS tablet Take 1 tablet (5 mg total) by mouth 2 (two) times daily.  Marland Kitchen atenolol (TENORMIN) 50 MG tablet Take 1 tablet (50 mg total) by mouth daily.  . bisoprolol (ZEBETA) 5 MG tablet Take 1 tablet (5 mg total) by mouth daily.  . [facility] ZINC 50 MG TABS Take 50 mg by mouth daily.  . diclofenac (VOLTAREN) 75 MG EC tablet Take 1 tablet (75 mg total) by mouth 2 (two) times daily.  . finasteride (PROSCAR) 5 MG tablet Take 1 tablet (5 mg total) by mouth daily.  . fluticasone (FLONASE) 50 MCG/ACT nasal spray Place 2 sprays into both nostrils 2 (two) times daily.  Marland Kitchen lidocaine (LIDODERM) 5 % Place 1 patch onto the skin daily. Remove & Discard patch within 12 hours or as directed by MD  . lovastatin (MEVACOR) 20 MG tablet TAKE 1 TABLET AT BEDTIME  . metoprolol tartrate (LOPRESSOR) 50 MG tablet Take 1 tablet (50 mg total) by mouth 2 (two) times daily.  . nebivolol (BYSTOLIC) 5 MG tablet Take 1 tablet (5 mg total) by mouth daily.  . nitroGLYCERIN (NITROSTAT) 0.4 MG SL tablet Place 1 tablet (0.4 mg total) under the tongue every 5 (five) minutes as needed for chest pain.  . potassium chloride SA (KLOR-CON M) 20 MEQ tablet Take 2 tablets daily every other day. Take with lasix  . predniSONE (DELTASONE) 20 MG tablet Take 2 tablets (40 mg total) by mouth daily with breakfast.  . tamsulosin (FLOMAX) 0.4 MG CAPS capsule Take 2 capsules (0.8 mg total) by mouth daily after breakfast.  . [DISCONTINUED] Fluticasone-Umeclidin-Vilant (TRELEGY ELLIPTA) 100-62.5-25 MCG/ACT AEPB Inhale 1 puff into the lungs daily.  . [DISCONTINUED] furosemide (LASIX) 40 MG tablet Take by mouth. 3 tablets daily and may take 4 tablets if needed  . [DISCONTINUED] ipratropium (ATROVENT HFA) 17 MCG/ACT inhaler Inhale 2 puffs into the lungs 4 (four) times daily.  . [DISCONTINUED] omeprazole (PRILOSEC) 20 MG capsule Take 20 mg by  mouth as needed.  . [DISCONTINUED] sertraline (ZOLOFT) 50 MG tablet Take 100 mg by mouth daily.  . Fluticasone-Umeclidin-Vilant (TRELEGY ELLIPTA) 100-62.5-25 MCG/ACT AEPB Inhale 1 puff into the lungs daily.  . furosemide (LASIX) 40 MG tablet Take 3 tablets (120 mg total) by mouth daily. 3 tablets daily and may take 4 tablets if needed  . sertraline (ZOLOFT) 100 MG tablet Take 1 tablet (100 mg total) by mouth daily.  . [DISCONTINUED] sertraline (ZOLOFT) 100 MG tablet Take 1 tablet (100 mg total) by mouth daily.   No facility-administered encounter medications on file as of 12/26/2022.    Review of Systems:  Review of Systems  Health Maintenance  Topic Date Due  . DTaP/Tdap/Td (1 - Tdap) Never done  . COVID-19 Vaccine (8 - 2023-24 season) 09/28/2022  . INFLUENZA VACCINE  04/25/2023  . Medicare Annual Wellness (AWV)  12/20/2023  . Pneumonia Vaccine 48+ Years old  Completed  . Zoster Vaccines- Shingrix  Completed  . HPV VACCINES  Aged Out    Physical Exam: Vitals:   12/26/22 1307  BP: 124/72  Pulse: 84  Temp: 98.1 F (36.7 C)  SpO2: 94%  Weight: 174 lb (78.9 kg)  Height: 5\' 11"  (1.803 m)   Body mass index is 24.27 kg/m. Physical Exam  Labs reviewed: Basic Metabolic Panel: Recent Labs    01/13/22 0752 05/31/22 1144 06/08/22 0903 11/22/22 0000 12/10/22 0000  NA 138 141 142 140  --   K 4.0 3.5 3.4* 3.3* 3.8  CL 110 105 99 103  --   CO2 24 28  --  30*  --   GLUCOSE 98 123* 98  --   --   BUN 13 14 <3* 18  --   CREATININE 0.79 0.94 0.90 1.0  --   CALCIUM 8.1* 9.1  --  9.3  --   MG 1.8  --   --   --   --    Liver Function Tests: Recent Labs    01/10/22 2039 01/11/22 0414 05/31/22 1144 11/22/22 0000  AST 16 17 20 15   ALT 9 10 13  9*  ALKPHOS 76 64 59 71  BILITOT 1.1 1.1 1.4*  --   PROT 7.2 6.2* 6.9  --   ALBUMIN 4.2 3.6 4.1 4.3   Recent Labs    01/10/22 2039  LIPASE 29   No results for input(s): "AMMONIA" in the last 8760 hours. CBC: Recent Labs     01/11/22 0414 01/13/22 0752 05/31/22 1144 06/08/22 0903 11/22/22 0000  WBC 8.0 6.7 5.6  --  5.6  HGB 13.1 11.4* 14.2 13.9 14.2  HCT 39.3 34.8* 42.8 41.0 42  MCV 96.6 99.1 98.2  --   --   PLT 140* 137* 156  --  146*   Lipid Panel: No results for input(s): "CHOL", "HDL", "LDLCALC", "TRIG", "CHOLHDL", "LDLDIRECT" in the last 8760 hours. Lab Results  Component Value Date   HGBA1C 5.7 11/22/2022    Procedures since last visit: No results found.  Assessment/Plan There are no diagnoses linked to this encounter.   Labs/tests ordered:  * No order type specified * Next appt:  Visit date not found

## 2023-01-14 IMAGING — CR DG CHEST 2V
1 series · 2 of 2 positions shown · non-contrast
Comparison: Chest CTA dated 03/21/2018. Chest radiographs dated
07/15/2014

CLINICAL DATA: Hemoptysis.  Recently diagnosed with pneumonia.

EXAM:
CHEST - 2 VIEW

[Series 1: dg chest 2 view · 0.14mm/px · 2 of 2 slices shown]
[im 1/2]
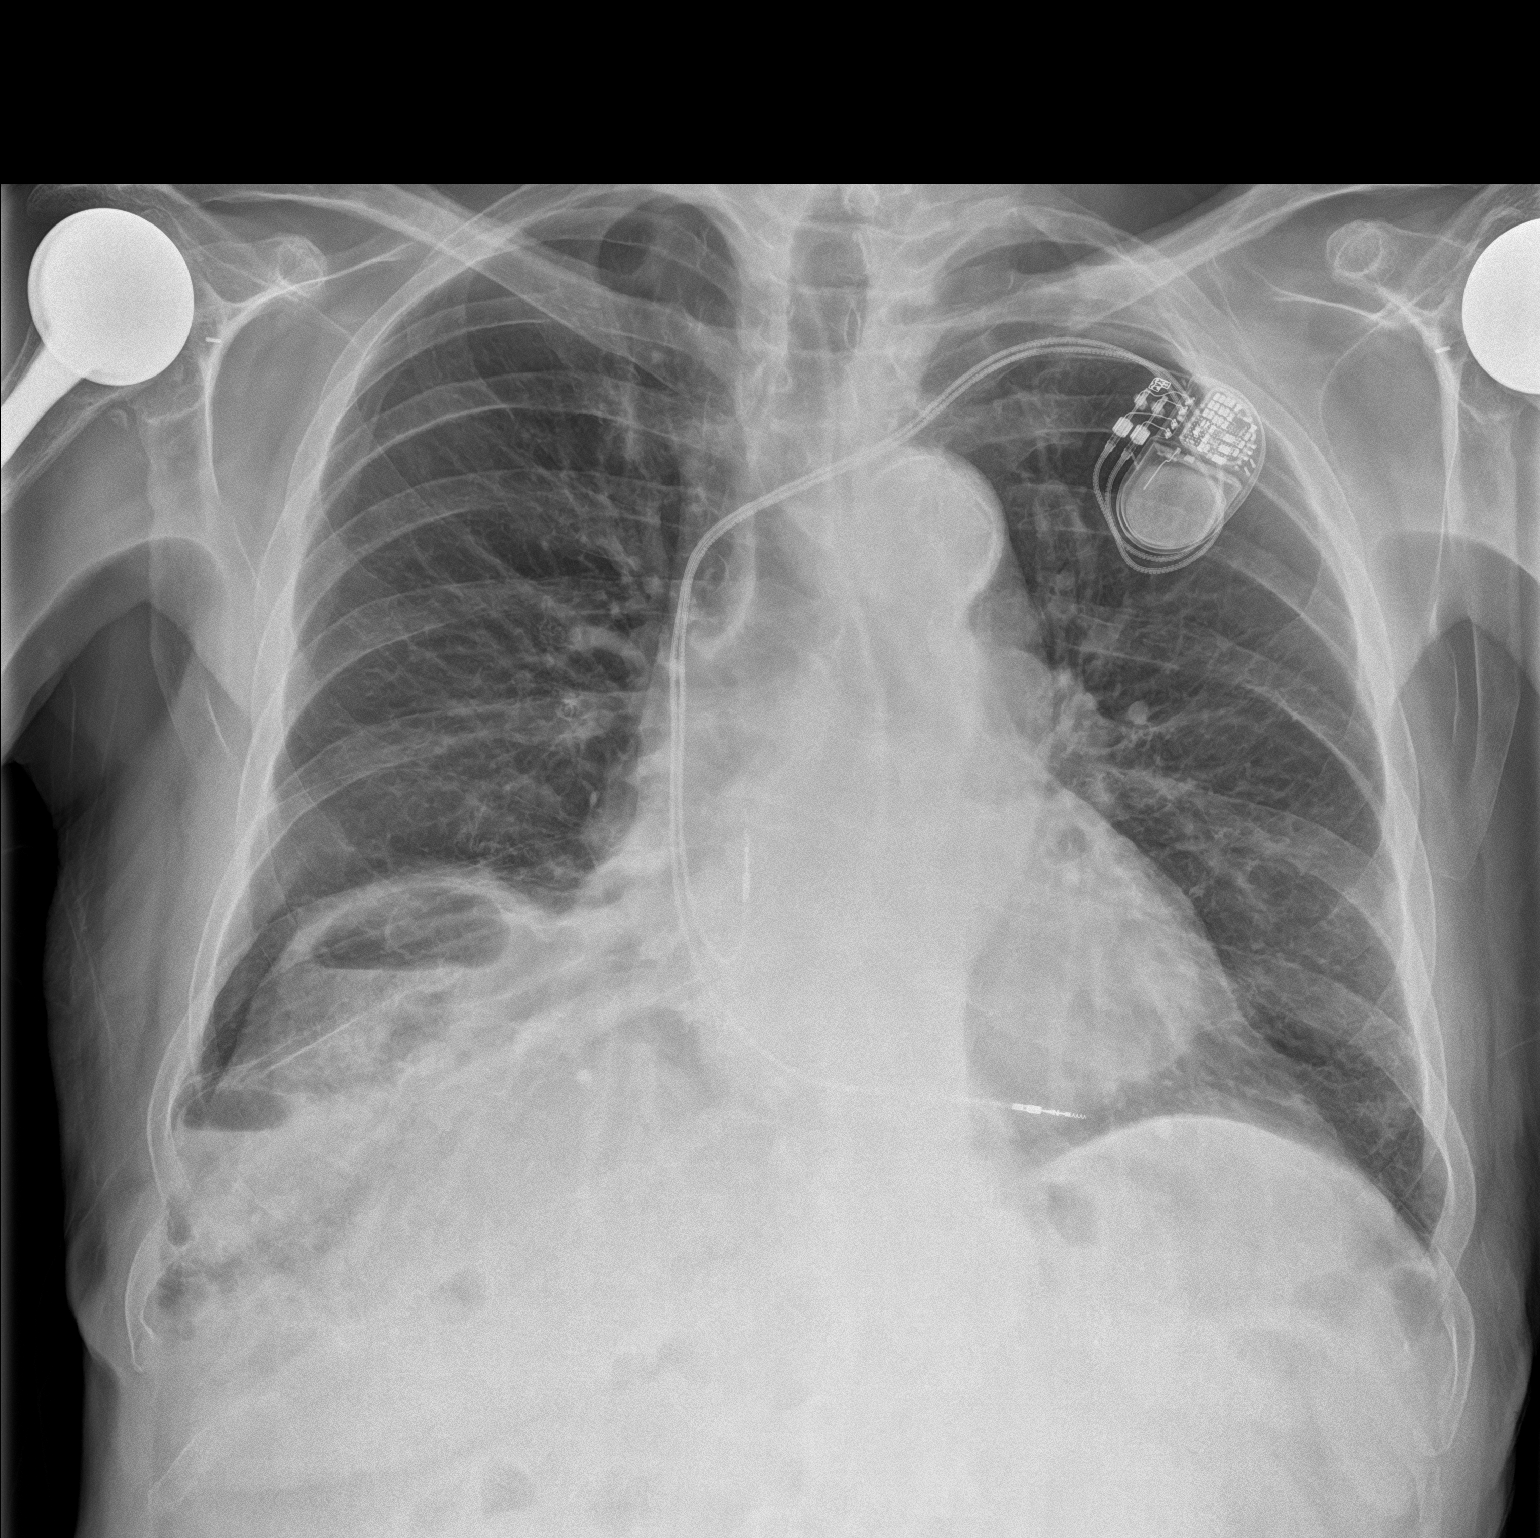
[im 2/2]
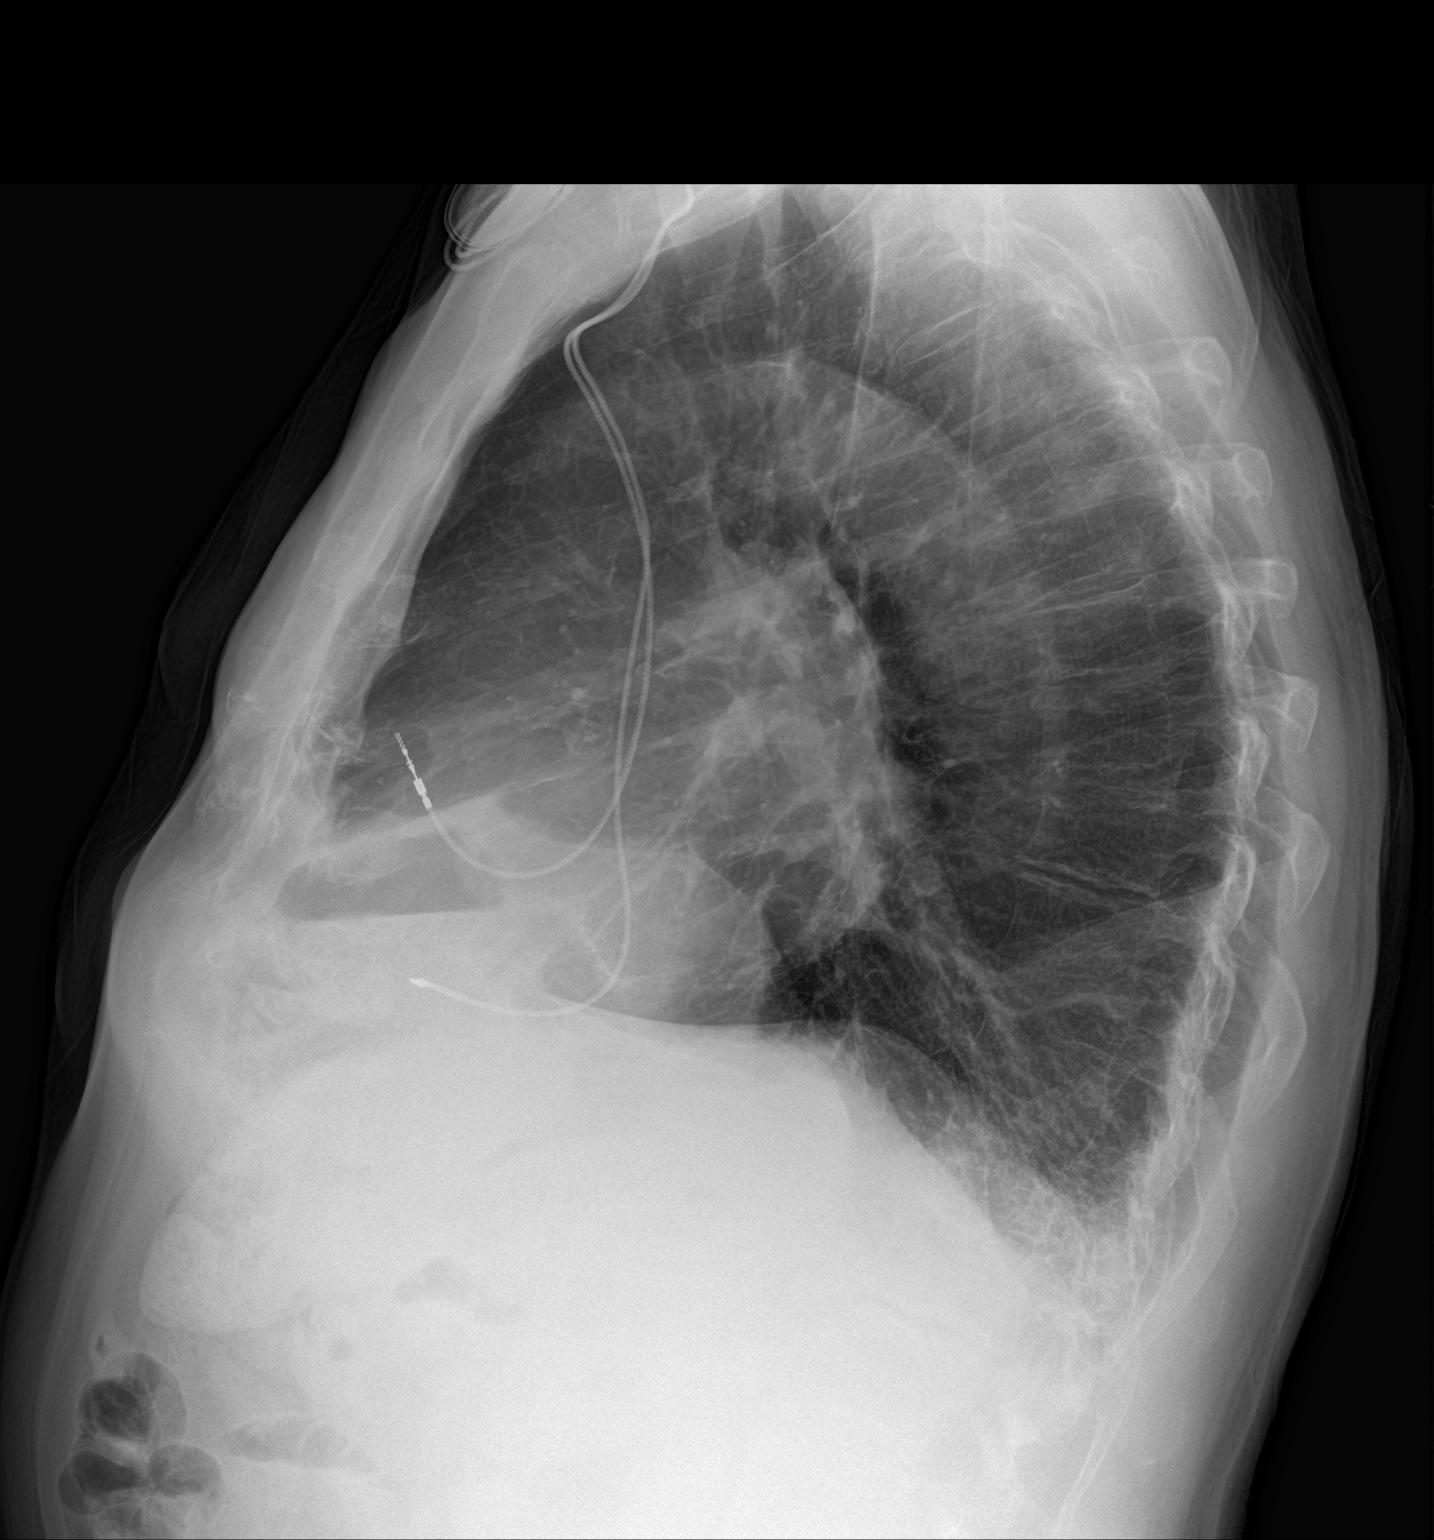

[2 of 2 positions shown; findings below may reference images not displayed]

FINDINGS: Normal sized heart. Tortuous and partially calcified thoracic aorta.
Left subclavian bipolar pacemaker leads in satisfactory position.
Stable right diaphragmatic eventration. Mild atelectasis or scarring
at the posterior lung bases. Bilateral shoulder prostheses.
IMPRESSION: No active cardiopulmonary disease.

## 2023-01-17 ENCOUNTER — Telehealth: Payer: Self-pay | Admitting: Internal Medicine

## 2023-01-17 ENCOUNTER — Other Ambulatory Visit: Payer: Self-pay

## 2023-01-17 MED ORDER — METOPROLOL TARTRATE 50 MG PO TABS: 50.0000 mg | ORAL_TABLET | Freq: Two times a day (BID) | ORAL | 3 refills | Status: DC

## 2023-01-17 NOTE — Telephone Encounter (Signed)
Patient spouse would like to know if we have authorized metoprolol medication  [facility] and was to reach out to our office Please call to discuss

## 2023-01-17 NOTE — Telephone Encounter (Signed)
Disp Refills Start End   metoprolol tartrate (LOPRESSOR) 50 MG tablet 180 tablet 3 01/17/2023 01/12/2024   Sig - Route: Take 1 tablet (50 mg total) by mouth 2 (two) times daily. - Oral   Sent to pharmacy as: metoprolol tartrate (LOPRESSOR) 50 MG tablet   E-Prescribing Status: Receipt confirmed by pharmacy (01/17/2023  2:10 PM EDT)    Pharmacy  [id number] [address], [place] - [phone]

## 2023-01-21 ENCOUNTER — Encounter: Payer: Self-pay | Admitting: Student

## 2023-01-21 ENCOUNTER — Ambulatory Visit: Payer: Medicare Other | Admitting: Student

## 2023-01-21 VITALS — BP 126/82 | HR 81 | Temp 98.2°F | Ht 71.0 in | Wt 172.0 lb

## 2023-01-21 DIAGNOSIS — J449 Chronic obstructive pulmonary disease, unspecified: Secondary | ICD-10-CM

## 2023-01-21 DIAGNOSIS — G250 Essential tremor: Secondary | ICD-10-CM

## 2023-01-21 DIAGNOSIS — I482 Chronic atrial fibrillation, unspecified: Secondary | ICD-10-CM | POA: Diagnosis not present

## 2023-01-21 DIAGNOSIS — F321 Major depressive disorder, single episode, moderate: Secondary | ICD-10-CM | POA: Diagnosis not present

## 2023-01-21 DIAGNOSIS — K219 Gastro-esophageal reflux disease without esophagitis: Secondary | ICD-10-CM

## 2023-01-21 DIAGNOSIS — I509 Heart failure, unspecified: Secondary | ICD-10-CM

## 2023-01-21 NOTE — Patient Instructions (Addendum)
Please go to your pharmacy and get your Tetanus shot.   I have written a consult for pulmonology to further discuss your breathing and symptoms.

## 2023-01-21 NOTE — Progress Notes (Signed)
Location:  [facility] clinic  Provider: Dr. Earnestine Mealing  Code Status: Full Code Goals of Care:     01/21/2023    1:26 PM  Advanced Directives  Does Patient Have a Medical Advance Directive? Yes  Type of Advance Directive Living will  Does patient want to make changes to medical advance directive? No - Patient declined     Chief Complaint  Patient presents with   Medical Management of Chronic Issues    Medical Management of Chronic Issues. 1 month follow up   Quality Metric Gaps    To discuss need for Tdap or postpone if patient refuses.     HPI: Patient is a 87 y.o. male seen today for medical management of chronic diseases.    His voice is weak. 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 Behavioral Voice Therapy recommended. He continues to use the acapella twice daily. This is the only exercise they ever gave him. Noticbly worse since his nasal polypectomy in December.   He feels more tired than he would like. His heart is not giving him any issues.   His breathing -- his wife says he has been coughing spells less than before. He is still using albutrol 3x per day. He has a lot of atrovent. He denies wheezing. He has taken the albuterol twice today already in addition to the trelegy.   He is trying to get to the 150s.   Essential Tremor - he hasn't been quilting which has been an issue and impacts his quality of life.   Able to do one chair stand. He cont  Past Medical History:  Diagnosis Date   Aortic atherosclerosis (HCC)    Aortic root dilation (HCC)    a.) TTE [id number]: Ao root measured 39 mm.   BPH (benign prostatic hyperplasia)    CHF (congestive heart failure) (HCC)    Coronary artery disease    a.) remote PCI of the RI in 1997; stent (unknown type) placed.   Depression    Diverticulosis    Eczema    GERD (gastroesophageal reflux disease)    Hyperlipidemia    Hypertension    Inguinal  hernia, left    Long term current use of anticoagulant    a.) apixaban   Nephrolithiasis    NSVT (nonsustained ventricular tachycardia) (HCC)    Osteoarthrosis    Paroxysmal A-fib (HCC)    a.) CHA2DS2VASc = 5 (age x2, CHF, HTN, vascular disease history);  b.) rate/rhythm maintained on oral metoprolol succinate; chronically anticoagulated with apixaban   Pharyngeal dysphagia    Presence of permanent cardiac pacemaker 2007   a.) Medtronic device placed in [place] in 2007; b.) generator changed at [facility] on 11/18/2015   RBBB    SSS (sick sinus syndrome) (HCC) 2007   a.) s/p PPM placement in [place] in 2007    Past Surgical History:  Procedure Laterality Date   BLEPHAROPLASTY Bilateral    CARDIAC CATHETERIZATION  09/25/1995   CARDIAC CATHETERIZATION  09/24/1998   CHOLECYSTECTOMY     COLONOSCOPY  09/24/2010   CORONARY ANGIOPLASTY  09/25/1995   ramus   ESOPHAGOGASTRODUODENOSCOPY N/A 02/11/2017   Procedure: ESOPHAGOGASTRODUODENOSCOPY (EGD);  Surgeon: Christena Deem, MD;  Location: [facility] ENDOSCOPY;  Service: Endoscopy;  Laterality: N/A;   INGUINAL HERNIA REPAIR Left 1977   INSERT / REPLACE / REMOVE PACEMAKER  12/23/2005   Medtronic [id number] - placed in [place]   INSERTION OF MESH Left 06/08/2022  Procedure: INSERTION OF MESH;  Surgeon: Campbell Lerner, MD;  Location: [facility] ORS;  Service: General;  Laterality: Left;   MANDIBLE SURGERY  1966   PACEMAKER GENERATOR CHANGE N/A 11/18/2015   ROBOTIC ASSISTED LAPAROSCOPIC CHOLECYSTECTOMY  01/12/2022   TONSILLECTOMY     TOTAL HIP ARTHROPLASTY Right 2022   TOTAL SHOULDER REPLACEMENT     bilateral approx 2020 and 2021    Allergies  Allergen Reactions   Amoxicillin Rash and Itching   Bactrim [Sulfamethoxazole-Trimethoprim] Rash    Outpatient Encounter Medications as of 01/21/2023  Medication Sig   acetaminophen (TYLENOL) 500 MG tablet Take 500 mg by mouth every 8 (eight) hours as needed.   albuterol (VENTOLIN HFA) 108 (90 Base)  MCG/ACT inhaler Inhale 2 puffs into the lungs every 6 (six) hours as needed for wheezing or shortness of breath.   apixaban (ELIQUIS) 5 MG TABS tablet Take 1 tablet (5 mg total) by mouth 2 (two) times daily.   atenolol (TENORMIN) 50 MG tablet Take 1 tablet (50 mg total) by mouth daily.   bisoprolol (ZEBETA) 5 MG tablet Take 1 tablet (5 mg total) by mouth daily.   [facility] ZINC 50 MG TABS Take 50 mg by mouth daily.   diclofenac (VOLTAREN) 75 MG EC tablet Take 1 tablet (75 mg total) by mouth 2 (two) times daily.   finasteride (PROSCAR) 5 MG tablet Take 1 tablet (5 mg total) by mouth daily.   fluticasone (FLONASE) 50 MCG/ACT nasal spray Place 2 sprays into both nostrils 2 (two) times daily.   Fluticasone-Umeclidin-Vilant (TRELEGY ELLIPTA) 100-62.5-25 MCG/ACT AEPB Inhale 1 puff into the lungs daily.   furosemide (LASIX) 40 MG tablet Take 3 tablets (120 mg total) by mouth daily. 3 tablets daily and may take 4 tablets if needed   lidocaine (LIDODERM) 5 % Place 1 patch onto the skin daily. Remove & Discard patch within 12 hours or as directed by MD   lovastatin (MEVACOR) 20 MG tablet TAKE 1 TABLET AT BEDTIME   metoprolol tartrate (LOPRESSOR) 50 MG tablet Take 1 tablet (50 mg total) by mouth 2 (two) times daily.   nebivolol (BYSTOLIC) 5 MG tablet Take 1 tablet (5 mg total) by mouth daily.   nitroGLYCERIN (NITROSTAT) 0.4 MG SL tablet Place 1 tablet (0.4 mg total) under the tongue every 5 (five) minutes as needed for chest pain.   potassium chloride SA (KLOR-CON M) 20 MEQ tablet Take 2 tablets daily every other day. Take with lasix   predniSONE (DELTASONE) 20 MG tablet Take 2 tablets (40 mg total) by mouth daily with breakfast.   sertraline (ZOLOFT) 100 MG tablet Take 1 tablet (100 mg total) by mouth daily.   tamsulosin (FLOMAX) 0.4 MG CAPS capsule Take 2 capsules (0.8 mg total) by mouth daily after breakfast.   No facility-administered encounter medications on file as of 01/21/2023.    Review of Systems:   Review of Systems  Health Maintenance  Topic Date Due   DTaP/Tdap/Td (1 - Tdap) Never done   COVID-19 Vaccine (9 - 2023-24 season) 02/26/2023   INFLUENZA VACCINE  04/25/2023   Medicare Annual Wellness (AWV)  12/20/2023   Pneumonia Vaccine 54+ Years old  Completed   Zoster Vaccines- Shingrix  Completed   HPV VACCINES  Aged Out    Physical Exam: Vitals:   01/21/23 1322  BP: 126/82  Pulse: 81  Temp: 98.2 F (36.8 C)  SpO2: 97%  Weight: 172 lb (78 kg)  Height: 5\' 11"  (1.803 m)   Body mass index  is 23.99 kg/m. Physical Exam Cardiovascular:     Rate and Rhythm: Normal rate.  Pulmonary:     Effort: Pulmonary effort is normal.     Breath sounds: Normal breath sounds.  Abdominal:     General: Abdomen is flat.  Skin:    Comments: Right face with 1cm ulceration with rolled edges. No bleeding or surrounding erythema.   Neurological:     Mental Status: He is alert and oriented to person, place, and time.     Labs reviewed: Basic Metabolic Panel: Recent Labs    05/31/22 1144 06/08/22 0903 11/22/22 0000 12/10/22 0000  NA 141 142 140  --   K 3.5 3.4* 3.3* 3.8  CL 105 99 103  --   CO2 28  --  30*  --   GLUCOSE 123* 98  --   --   BUN 14 <3* 18  --   CREATININE 0.94 0.90 1.0  --   CALCIUM 9.1  --  9.3  --    Liver Function Tests: Recent Labs    05/31/22 1144 11/22/22 0000  AST 20 15  ALT 13 9*  ALKPHOS 59 71  BILITOT 1.4*  --   PROT 6.9  --   ALBUMIN 4.1 4.3   No results for input(s): "LIPASE", "AMYLASE" in the last 8760 hours. No results for input(s): "AMMONIA" in the last 8760 hours. CBC: Recent Labs    05/31/22 1144 06/08/22 0903 11/22/22 0000  WBC 5.6  --  5.6  HGB 14.2 13.9 14.2  HCT 42.8 41.0 42  MCV 98.2  --   --   PLT 156  --  146*   Lipid Panel: No results for input(s): "CHOL", "HDL", "LDLCALC", "TRIG", "CHOLHDL", "LDLDIRECT" in the last 8760 hours. Lab Results  Component Value Date   HGBA1C 5.7 11/22/2022    Procedures since last  visit: No results found.  Assessment/Plan 1. Chronic obstructive pulmonary disease, unspecified COPD type (HCC) Lungs clear to auscultation. Continues to use albuterol TID -- discussed usage. Due to concern for need of additional treatment or testing will refer to pulmonology for additional support in management. Concern his fatigue is secondary to pulmonary disease. Continues to have weak voice-- hx of laryngeal atrophy and continues to do exercises.  Continue trelegy.   2. Essential tremor Symptoms continue to impact QOL. Declines medication at this time. Primidone contraindicated given eliquis. Propranolol not recommended in this age group. Normal gait. Negative cog wheeling on exam. Also concern for primidone can increase suicidal ideation and patient denies those at this time, however, has significant depression.  3. Chronic atrial fibrillation (HCC) Rate well-controlled. Continues to have trial of various BB to see which helps his HR without making him feel fatigued.   4. Current moderate episode of major depressive disorder, unspecified whether recurrent (HCC) QOL impacts from tremor continue impacting his mood. Continue sertraline 100mg . F/u 6wks, continue increasing dose at f/u.   5. GERD without esophagitis Denies symptoms at this time. CTM.   6. Congestive heart failure, unspecified HF chronicity, unspecified heart failure type [facility]) Patient appears euvolemic on exam. Most recent echocardiogram "Echocardiogram results reviewed Low normal left ventricular function Significant wall thickening noted Dilation of left atrium Findings are relatively similar to prior study June 2018" Continue co-management with cardiology     Labs/tests ordered:  * No order type specified * Next appt:  Visit date not found   I spent 30 minutes for the care of this patient in chart review, patient  education, face to face, time and clinical documentation.

## 2023-01-29 ENCOUNTER — Ambulatory Visit (INDEPENDENT_AMBULATORY_CARE_PROVIDER_SITE_OTHER): Payer: Medicare Other

## 2023-01-29 DIAGNOSIS — I495 Sick sinus syndrome: Secondary | ICD-10-CM | POA: Diagnosis not present

## 2023-01-29 LAB — CUP PACEART REMOTE DEVICE CHECK
Battery Remaining Longevity: 29 mo
Battery Voltage: 2.95 V
Brady Statistic AP VP Percent: 0.5 %
Brady Statistic AP VS Percent: 95.63 %
Brady Statistic AS VP Percent: 0.89 %
Brady Statistic AS VS Percent: 2.98 %
Brady Statistic RA Percent Paced: 95.32 %
Brady Statistic RV Percent Paced: 1.47 %
Date Time Interrogation Session: 20240507102300
Implantable Lead Connection Status: 753985
Implantable Lead Connection Status: 753985
Implantable Lead Implant Date: 20070411
Implantable Lead Implant Date: 20070411
Implantable Lead Location: 753859
Implantable Lead Location: 753860
Implantable Lead Model: 5076
Implantable Lead Model: 5076
Implantable Pulse Generator Implant Date: 20170224
Lead Channel Impedance Value: 342 Ohm
Lead Channel Impedance Value: 342 Ohm
Lead Channel Impedance Value: 380 Ohm
Lead Channel Impedance Value: 456 Ohm
Lead Channel Pacing Threshold Amplitude: 0.875 V
Lead Channel Pacing Threshold Amplitude: 1.125 V
Lead Channel Pacing Threshold Pulse Width: 0.4 ms
Lead Channel Pacing Threshold Pulse Width: 0.4 ms
Lead Channel Sensing Intrinsic Amplitude: 1.25 mV
Lead Channel Sensing Intrinsic Amplitude: 1.25 mV
Lead Channel Sensing Intrinsic Amplitude: 5.75 mV
Lead Channel Sensing Intrinsic Amplitude: 5.75 mV
Lead Channel Setting Pacing Amplitude: 2 V
Lead Channel Setting Pacing Amplitude: 2.5 V
Lead Channel Setting Pacing Pulse Width: 0.4 ms
Lead Channel Setting Sensing Sensitivity: 0.9 mV
Zone Setting Status: 755011
Zone Setting Status: 755011

## 2023-02-06 ENCOUNTER — Ambulatory Visit: Payer: Medicare Other | Admitting: Urology

## 2023-02-07 ENCOUNTER — Ambulatory Visit: Payer: Medicare Other | Admitting: Urology

## 2023-02-11 ENCOUNTER — Other Ambulatory Visit (HOSPITAL_COMMUNITY): Payer: Self-pay

## 2023-02-11 ENCOUNTER — Telehealth: Payer: Self-pay

## 2023-02-11 NOTE — Telephone Encounter (Signed)
Pharmacy Patient Advocate Encounter   Received notification from [employer] that prior authorization for NEBIVOLOL HCl 5MG  TAB is needed.    PA submitted on 02/11/23 Key [id number] Status is pending  Haze Rushing, CPhT Pharmacy Patient Advocate Specialist Direct Number: [phone] Fax: [phone]

## 2023-02-11 NOTE — Telephone Encounter (Signed)
Pharmacy Patient Advocate Encounter  Prior Authorization for NEBIVOLOL has been approved.    PA# [id number] Effective dates: 01/12/23 through 09/23/98   Haze Rushing, CPhT Pharmacy Patient Advocate Specialist Direct Number: [phone] Fax: [phone]

## 2023-02-20 NOTE — Progress Notes (Signed)
Remote pacemaker transmission.   

## 2023-02-27 IMAGING — RF DG ESOPHAGUS
7 series · 14 of 21 positions shown · non-contrast
Comparison: Esophagram dated October 24, 2016.

CLINICAL DATA: Dysphagia and coughing with foods.

EXAM:
ESOPHAGUS/BARIUM SWALLOW/TABLET STUDY
TECHNIQUE: Single contrast examination was performed using thick liquid barium.
This exam was performed by Sieber Srdjan PA, and was supervised and
interpreted by Zeinab Tiger.
FLUOROSCOPY TIME:  Radiation Exposure Index (as provided by the
fluoroscopic device): 226.27 Gy*m2
Fluoroscopy Time:  1.4 seconds
Number of Acquired Images:  0

[Series 1: cp_standard · 0.17mm/px · 1 of 1 slices shown (1 of 7)]
[im 1/1]
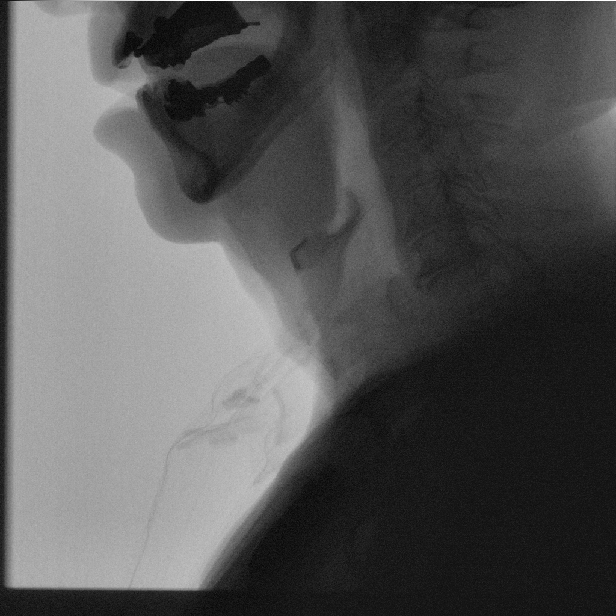

[Series 2: cp_standard · 0.17mm/px · 2 of 45 frames shown (2 of 7)]
[frame 23/45]
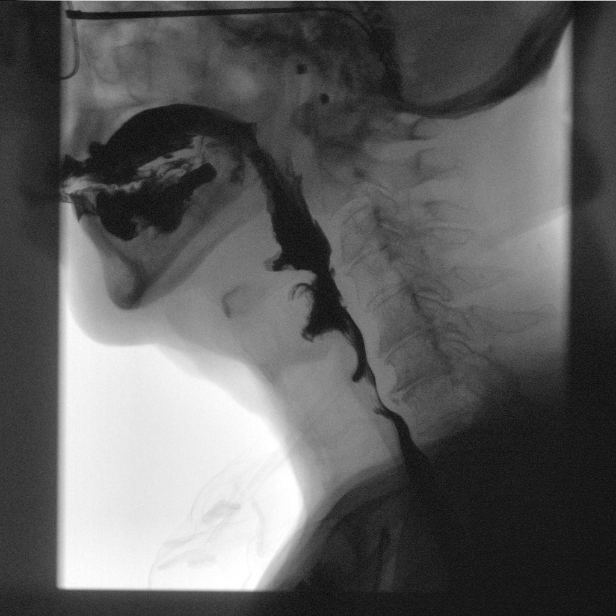
[frame 24/45]
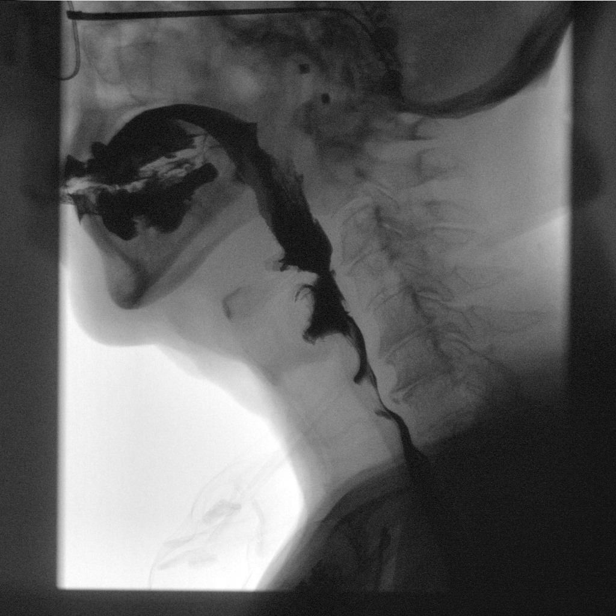

[Series 3: cp_standard · 0.17mm/px · 2 of 65 frames shown (3 of 7)]
[frame 10/65]
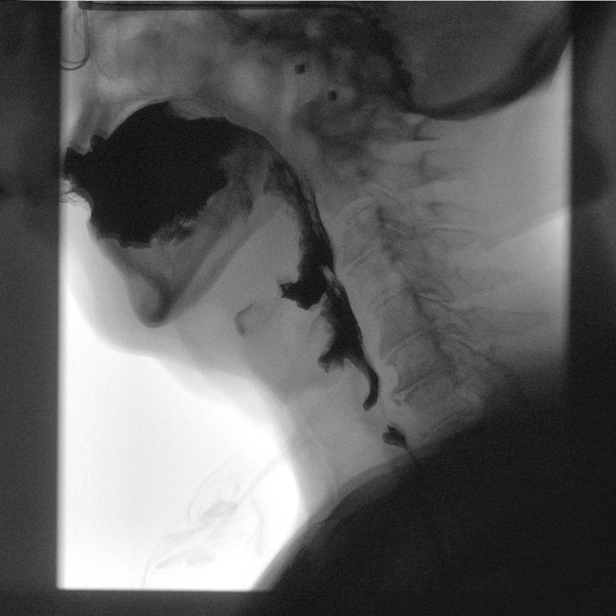
[frame 33/65]
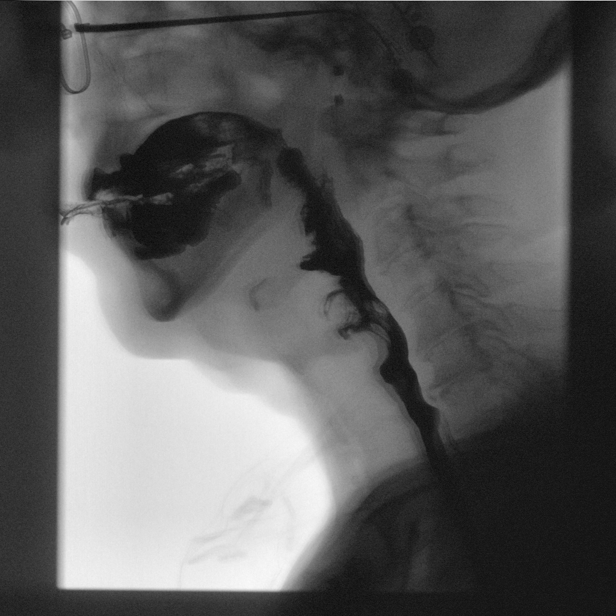

[Series 4: cp_standard · 0.18mm/px · 3 of 114 frames shown (4 of 7)]
[frame 18/114]
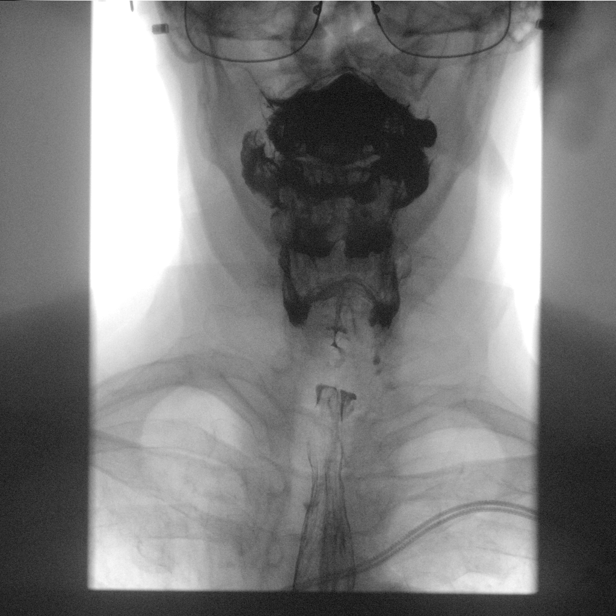
[frame 58/114]
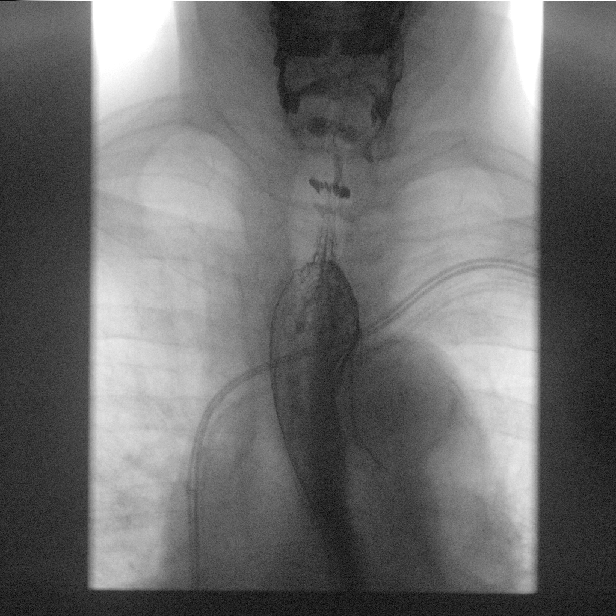
[frame 98/114]
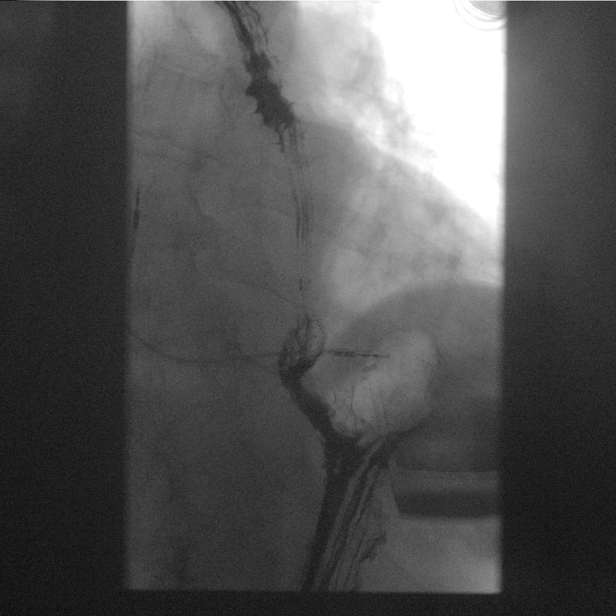

[Series 5: cp_standard · 0.27mm/px · 3 of 104 frames shown (5 of 7)]
[frame 16/104]
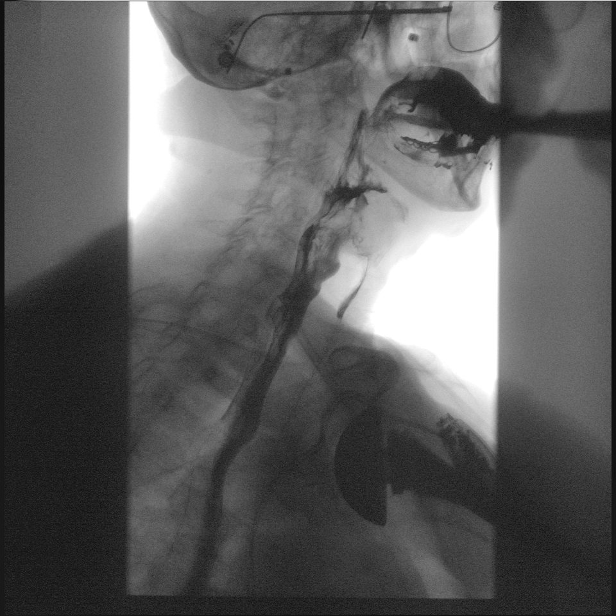
[frame 53/104]
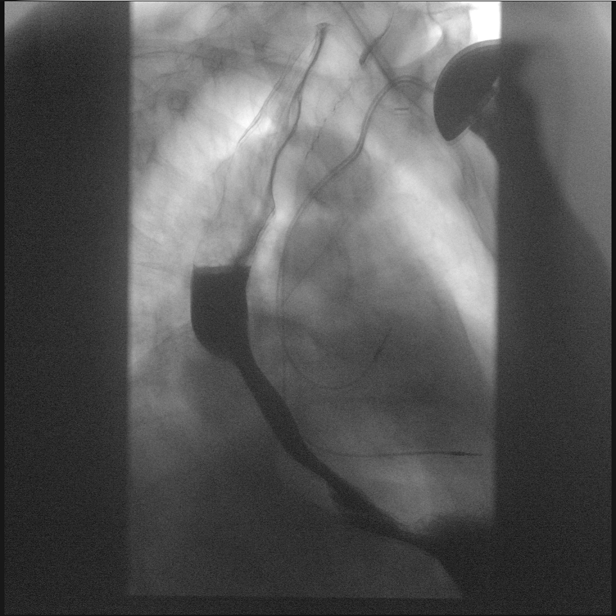
[frame 89/104]
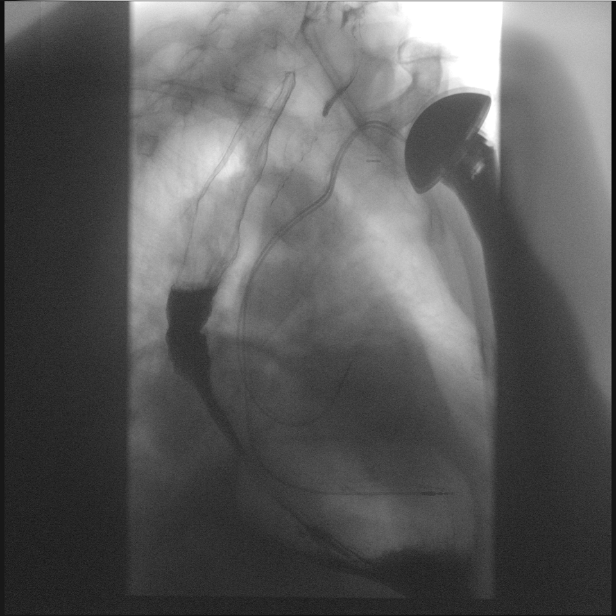

[Series 6: cp_standard · 0.26mm/px · 2 of 65 frames shown (6 of 7)]
[frame 10/65]
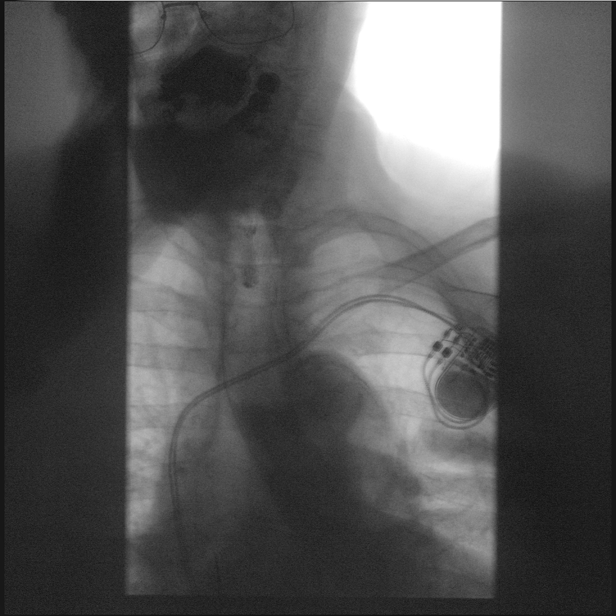
[frame 33/65]
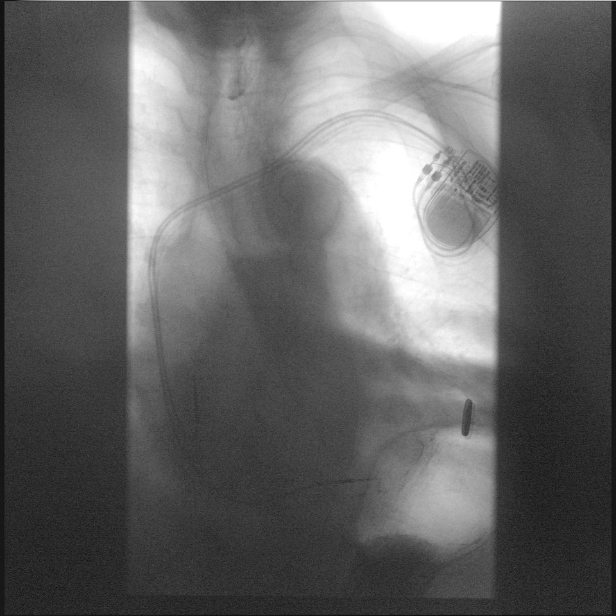

[Series 7: cp_standard · 0.26mm/px · 1 of 1 slices shown (7 of 7)]
[im 1/1]
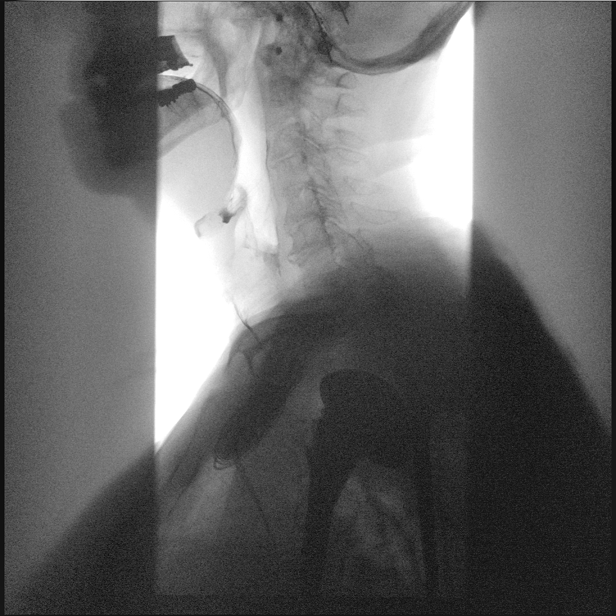

[14 of 21 positions shown; findings below may reference images not displayed]

FINDINGS: Swallowing: Laryngeal penetration and silent aspiration occurred
with thick barium.

Pharynx: Unremarkable.

Esophagus: Normal appearance.

Esophageal motility: Intermittent tertiary contractions again noted.

Hiatal Hernia: None.

Gastroesophageal reflux: None visualized.

Ingested 13mm barium tablet: Passed normally.

Other: None.
IMPRESSION: 1. Silent aspiration with thick barium.
2. No evidence of pharyngeal or esophageal obstruction.
3. Presbyesophagus, similar to prior study.

## 2023-03-01 ENCOUNTER — Telehealth: Payer: Self-pay

## 2023-03-01 ENCOUNTER — Emergency Department: Payer: Medicare Other

## 2023-03-01 ENCOUNTER — Emergency Department
Admission: EM | Admit: 2023-03-01 | Discharge: 2023-03-01 | Disposition: A | Discharge status: Home / Self Care | Payer: Medicare Other | Attending: Emergency Medicine | Admitting: Emergency Medicine

## 2023-03-01 ENCOUNTER — Other Ambulatory Visit: Payer: Self-pay

## 2023-03-01 ENCOUNTER — Encounter: Payer: Self-pay | Admitting: Emergency Medicine

## 2023-03-01 DIAGNOSIS — S0011XA Contusion of right eyelid and periocular area, initial encounter: Secondary | ICD-10-CM | POA: Insufficient documentation

## 2023-03-01 DIAGNOSIS — W010XXA Fall on same level from slipping, tripping and stumbling without subsequent striking against object, initial encounter: Secondary | ICD-10-CM | POA: Insufficient documentation

## 2023-03-01 DIAGNOSIS — Z7901 Long term (current) use of anticoagulants: Secondary | ICD-10-CM | POA: Insufficient documentation

## 2023-03-01 DIAGNOSIS — Z23 Encounter for immunization: Secondary | ICD-10-CM | POA: Diagnosis not present

## 2023-03-01 DIAGNOSIS — Y92531 Health care provider office as the place of occurrence of the external cause: Secondary | ICD-10-CM | POA: Insufficient documentation

## 2023-03-01 DIAGNOSIS — Z950 Presence of cardiac pacemaker: Secondary | ICD-10-CM | POA: Insufficient documentation

## 2023-03-01 DIAGNOSIS — S80211A Abrasion, right knee, initial encounter: Secondary | ICD-10-CM | POA: Insufficient documentation

## 2023-03-01 DIAGNOSIS — W19XXXA Unspecified fall, initial encounter: Secondary | ICD-10-CM

## 2023-03-01 DIAGNOSIS — S0083XA Contusion of other part of head, initial encounter: Secondary | ICD-10-CM

## 2023-03-01 DIAGNOSIS — S0993XA Unspecified injury of face, initial encounter: Secondary | ICD-10-CM | POA: Diagnosis present

## 2023-03-01 MED ORDER — TETRACAINE HCL 0.5 % OP SOLN
1.0000 [drp] | Freq: Once | OPHTHALMIC | Status: AC
  Administered 2023-03-01: 1 [drp] via OPHTHALMIC
  Filled 2023-03-01: qty 4

## 2023-03-01 MED ORDER — TETANUS-DIPHTH-ACELL PERTUSSIS 5-2.5-18.5 LF-MCG/0.5 IM SUSY
0.5000 mL | PREFILLED_SYRINGE | Freq: Once | INTRAMUSCULAR | Status: AC
  Administered 2023-03-01: 0.5 mL via INTRAMUSCULAR
  Filled 2023-03-01: qty 0.5

## 2023-03-01 MED ORDER — FLUORESCEIN SODIUM 1 MG OP STRP
1.0000 | ORAL_STRIP | Freq: Once | OPHTHALMIC | Status: AC
  Administered 2023-03-01: 1 via OPHTHALMIC
  Filled 2023-03-01: qty 1

## 2023-03-01 NOTE — ED Triage Notes (Signed)
Pt presents to the ED due to fall. Pt was at the dentisit office yesterday and after his cleaning he "face planted". Pt was seen by EMS yesterday and this morning. Pt was told to come to the ED because he is on blood thinners. Pt A&Ox4

## 2023-03-01 NOTE — ED Notes (Signed)
Visual Acuity  Left eye  20/40                        Right eye 20/30                         Both eyes 20/30

## 2023-03-01 NOTE — Telephone Encounter (Signed)
Patient's wife called stating that he had fall on yesterday. She says he was checked out by the EMS team at [facility] and told he may have a concussion.  I had a verbal conversation with Dr. Earnestine Mealing and she stated that patient should be seen in the ED due to his being on Eliquis and to make sure no bleeding going on.  I returned call to patient's wife and informed her of Dr. Orene Desanctis response. She verbalized her understanding and states that she will speak with patient to see if he agrees.  Message sent to Dr. Earnestine Mealing [facility])

## 2023-03-01 NOTE — ED Provider Notes (Signed)
[facility] Provider Note    Event Date/Time   First MD Initiated Contact with Patient 03/01/23 1057     (approximate)   History   Fall   HPI  Kyle Morales is a 87 y.o. male who comes in with a fall.  Patient was at the dentist office yesterday after a dental cleaning he face planted and was told to go to the ED due to him being on a blood thinner.  On review of records patient is on Eliquis.  Patient does report a history of sinus node dysfunction which she has a pacemaker for.  He states that he also has A-fib which is why he is on the Eliquis.  He does report having prior cataract surgery in his right eye  But he is unclear of ever having been told that he has a abnormal looking pupil on his right.  Patient states that he was leaving from his dental cleaning when he stated that he tripped and he fell.  He reports that it was mechanical in nature he denies any syncope, chest pain, shortness of breath.  He reports that he just face planted.  Denies any LOC.  He states that he was seen by the EMS team at his living facility and they told him to come in because he was on a blood thinner but he did not initially want to come until today.  He denies any significant symptoms except for a little bit of mild headache.  Unclear of last tetanus shot and a little bit of blurred vision in his right eye.  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  I was able to find a eye exam during this visit where his right pupil was noted to be 7 and irregular shaped with minimal reactivity where his left eye was noted to be 3 and round with brisk reactivity.  He was 20/25 in the right and 20/30 in the left.  I reviewed the note from [facility] where patient was seen for his dental cleaning on 6/6     Physical Exam   Triage Vital Signs: ED Triage Vitals  Enc Vitals Group     BP 03/01/23  1036 120/86     Pulse Rate 03/01/23 1036 87     Resp 03/01/23 1036 16     Temp 03/01/23 1036 (!) 97.4 F (36.3 C)     Temp Source 03/01/23 1036 Oral     SpO2 03/01/23 1036 96 %     Weight 03/01/23 1038 171 lb 15.3 oz (78 kg)     Height 03/01/23 1049 5\' 11"  (1.803 m)     Head Circumference --      Peak Flow --      Pain Score 03/01/23 1037 0     Pain Loc --      Pain Edu? --      Excl. in GC? --     Most recent vital signs: Vitals:   03/01/23 1036  BP: 120/86  Pulse: 87  Resp: 16  Temp: (!) 97.4 F (36.3 C)  SpO2: 96%     General: Awake, no distress.  CV:  Good peripheral perfusion.  Resp:  Normal effort.  Abd:  No distention.  Other:  Patient is some bruising noted around the right eye.  His right eye vision is 20/30 in the left eye is 20/40.  He is right pupil is irregular shaped and minimally reactive and larger than the left I.  He is got no significant tenderness around the eye but some superficial abrasions above the right eye.  He has no C-spine tenderness.  Full range of motion of neck.  No numbness or tingling his arms or legs.  He has good strength in arms and legs.  He has small abrasion noted on his right knee but full range of motion and able to ambulate with his cane at baseline. Fluorescein stain was normal.  Pressure in the right eye was 9 pressure in the left eye was 10  ED Results / Procedures / Treatments   Labs (all labs ordered are listed, but only abnormal results are displayed) Labs Reviewed - No data to display    RADIOLOGY I have reviewed the CT had personally interpreted no evidence of intracranial hemorrhage   PROCEDURES:  Critical Care performed: No  Procedures   MEDICATIONS ORDERED IN ED: Medications - No data to display   IMPRESSION / MDM / ASSESSMENT AND PLAN / ED COURSE  I reviewed the triage vital signs and the nursing notes.   Patient's presentation is most consistent with acute presentation with potential threat to life  or bodily function.   Differential includes facial fracture, eye injury, intracranial hemorrhage.  CT imaging was ordered in triage and had already resulted prior to patient getting back to the room.  I had called CT while patient was in the scanner to see if he had had any cervical pain to see if we needed to add this on and he had denied it.  On my evaluation he denied any C-spine tenderness had full range of motion of neck and denied any numbness or tingling.  Therefore by Nexus criteria patient was cleared from a C-spine injury.  CT head and neck were negative.  There was concern for age-indeterminate lacunar infarct in the right thalamus which I discussed with them he is already on full stroke prevention with Eliquis and denies any strokelike symptoms NIHSS is zero- I will d/w neuro on recs.   For his asymmetric eye on the right I did find a note from [facility] where his exam today seems pretty consistent with the exam previously therefore this makes me think that this is an old eye injury.  His vision acuity is pretty similar at 2030 today on the right when before it was 2025 back in February  Given the age-indeterminate finding on CT imaging I discussed case with Dr. Selina Cooley neuro-initially we had considered getting an MRI but patient has a pacemaker so would not be able to be have it done here.  He could go over to [facility] but he is asymptomatic from it is an incidental finding and its is small enough area that would most likely would not change use of his blood thinner therefore Dr. Selina Cooley was okay with patient following up outpatient.  Patient continues to deny any neurological symptoms neuroexam is 0 and feels comfortable following up outpatient with neurology  I discussed the provisional nature of ED diagnosis, the treatment so far, the ongoing plan of care, follow up appointments and return precautions with the patient and any family or support people present. They expressed understanding and agreed  with the plan, discharged home.      FINAL CLINICAL IMPRESSION(S) / ED DIAGNOSES   Final diagnoses:  Fall, initial encounter  Contusion of face, initial encounter     Rx / DC  Orders   ED Discharge Orders     None        Note:  This document was prepared using Dragon voice recognition software and may include unintentional dictation errors.   Concha Se, MD 03/01/23 [phone]

## 2023-03-01 NOTE — Discharge Instructions (Addendum)
Given the small size of this area and you not having any stroke symptoms you can follow this up with your primary care doctor.  Could consider ordering an MRI outpatient to evaluate for strokes but these would have to be done at [facility] given we are unable to do MRI here at [facility] due to you having a pacemaker.  He should return to the ER if you ever develop slurred speech weakness in 1 arm or any other concerns otherwise at this time the Eliquis will help prevent stroke.  You should call your eye doctor to make a follow-up appointment for your eye return to the ER if you develop worsening symptoms or any other concerns  IMPRESSION: 1. No acute intracranial hemorrhage. Age-indeterminate lacunar infarct in the right thalamus. 2. No acute facial bone fracture.

## 2023-03-04 ENCOUNTER — Ambulatory Visit: Payer: Medicare Other | Admitting: Student

## 2023-03-04 ENCOUNTER — Encounter: Payer: Self-pay | Admitting: Student

## 2023-03-04 VITALS — BP 120/82 | HR 72 | Temp 98.2°F | Ht 71.0 in | Wt 164.0 lb

## 2023-03-04 DIAGNOSIS — H5702 Anisocoria: Secondary | ICD-10-CM | POA: Diagnosis not present

## 2023-03-04 DIAGNOSIS — R739 Hyperglycemia, unspecified: Secondary | ICD-10-CM

## 2023-03-04 DIAGNOSIS — R062 Wheezing: Secondary | ICD-10-CM

## 2023-03-04 DIAGNOSIS — R0982 Postnasal drip: Secondary | ICD-10-CM

## 2023-03-04 DIAGNOSIS — W19XXXD Unspecified fall, subsequent encounter: Secondary | ICD-10-CM

## 2023-03-04 DIAGNOSIS — E538 Deficiency of other specified B group vitamins: Secondary | ICD-10-CM

## 2023-03-04 DIAGNOSIS — R682 Dry mouth, unspecified: Secondary | ICD-10-CM

## 2023-03-04 DIAGNOSIS — R251 Tremor, unspecified: Secondary | ICD-10-CM | POA: Diagnosis not present

## 2023-03-04 DIAGNOSIS — I509 Heart failure, unspecified: Secondary | ICD-10-CM

## 2023-03-04 MED ORDER — FLUTICASONE PROPIONATE 50 MCG/ACT NA SUSP: 2.0000 | Freq: Two times a day (BID) | NASAL | 3 refills | Status: DC

## 2023-03-04 MED ORDER — ALBUTEROL SULFATE HFA 108 (90 BASE) MCG/ACT IN AERS: 2.0000 | INHALATION_SPRAY | Freq: Four times a day (QID) | RESPIRATORY_TRACT | 3 refills | Status: DC | PRN

## 2023-03-04 NOTE — Patient Instructions (Addendum)
I have written a consultation to Neurology for your changes in movements, worsening tremors, your abnormal CT scan findings (concerning for an old bleed), as well as your eye.   Please use flonase daily to help with the drainage in the back of your throat.   Continue to drink plenty of fluids - at least 8 classes of 8 ounces of water per day for your dry mouth.

## 2023-03-04 NOTE — Progress Notes (Signed)
Location:  [facility].   Provider: Dr. Earnestine Mealing  Code Status: Full Code Goals of Care:     03/04/2023    1:27 PM  Advanced Directives  Does Patient Have a Medical Advance Directive? Yes  Type of Advance Directive Living will  Does patient want to make changes to medical advance directive? No - Patient declined     Chief Complaint  Patient presents with   Medical Management of Chronic Issues    Medical Management of Chronic Issues. 6 Week Follow up. Fell Thursday and hit head.    Quality Metric Gaps    To discuss need for Covid Vaccine.     HPI: Patient is a 87 y.o. male seen today for medical management of chronic diseases.  Fall on Thursday and was seen in ER on Friday.   In the emergency room his right pupil was larger than the left. They want him to see neuroopghtalmologist. 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 His left foot got caught on the floor and he faceplanted. He has an ophthalmology appointment.   Dry mouth- drinks lots of water, but has had a dry mouth for the last 2 weeks.   He sleeps a lot, but he doesn't do anything to be His voice is going again. He previously had nasal polyps and he has a 20% chance of recurrence. When he blows his nose his ears pop.  He has a bit of a cough, but it seems moreso to clear.   Past Medical History:  Diagnosis Date   Aortic atherosclerosis (HCC)    Aortic root dilation (HCC)    a.) TTE [id number]: Ao root measured 39 mm.   BPH (benign prostatic hyperplasia)    CHF (congestive heart failure) (HCC)    Coronary artery disease    a.) remote PCI of the RI in 1997; stent (unknown type) placed.   Depression    Diverticulosis    Eczema    GERD (gastroesophageal reflux disease)    Hyperlipidemia    Hypertension    Inguinal hernia, left    Long term current use of anticoagulant    a.) apixaban   Nephrolithiasis    NSVT (nonsustained ventricular tachycardia) (HCC)     Osteoarthrosis    Paroxysmal A-fib (HCC)    a.) CHA2DS2VASc = 5 (age x2, CHF, HTN, vascular disease history);  b.) rate/rhythm maintained on oral metoprolol succinate; chronically anticoagulated with apixaban   Pharyngeal dysphagia    Presence of permanent cardiac pacemaker 2007   a.) Medtronic device placed in [place] in 2007; b.) generator changed at [facility] on 11/18/2015   RBBB    SSS (sick sinus syndrome) (HCC) 2007   a.) s/p PPM placement in [place] in 2007    Past Surgical History:  Procedure Laterality Date   BLEPHAROPLASTY Bilateral    CARDIAC CATHETERIZATION  09/25/1995   CARDIAC CATHETERIZATION  09/24/1998   CHOLECYSTECTOMY     COLONOSCOPY  09/24/2010   CORONARY ANGIOPLASTY  09/25/1995   ramus   ESOPHAGOGASTRODUODENOSCOPY N/A 02/11/2017   Procedure: ESOPHAGOGASTRODUODENOSCOPY (EGD);  Surgeon: Christena Deem, MD;  Location: [facility] ENDOSCOPY;  Service: Endoscopy;  Laterality: N/A;   INGUINAL HERNIA REPAIR Left 1977   INSERT / REPLACE / REMOVE PACEMAKER  12/23/2005   Medtronic [id number] - placed in [place]   INSERTION OF MESH Left 06/08/2022   Procedure: INSERTION OF MESH;  Surgeon: Campbell Lerner, MD;  Location: [facility] ORS;  Service: General;  Laterality: Left;   MANDIBLE SURGERY  1966   PACEMAKER GENERATOR CHANGE N/A 11/18/2015   ROBOTIC ASSISTED LAPAROSCOPIC CHOLECYSTECTOMY  01/12/2022   TONSILLECTOMY     TOTAL HIP ARTHROPLASTY Right 2022   TOTAL SHOULDER REPLACEMENT     bilateral approx 2020 and 2021    Allergies  Allergen Reactions   Amoxicillin Rash and Itching   Bactrim [Sulfamethoxazole-Trimethoprim] Rash    Outpatient Encounter Medications as of 03/04/2023  Medication Sig   acetaminophen (TYLENOL) 500 MG tablet Take 500 mg by mouth every 8 (eight) hours as needed.   apixaban (ELIQUIS) 5 MG TABS tablet Take 1 tablet (5 mg total) by mouth 2 (two) times daily.   atenolol (TENORMIN) 50 MG tablet Take 1 tablet (50 mg total) by mouth daily.   bisoprolol  (ZEBETA) 5 MG tablet Take 1 tablet (5 mg total) by mouth daily.   [employer] ZINC 50 MG TABS Take 50 mg by mouth daily.   diclofenac (VOLTAREN) 75 MG EC tablet Take 1 tablet (75 mg total) by mouth 2 (two) times daily.   finasteride (PROSCAR) 5 MG tablet Take 1 tablet (5 mg total) by mouth daily.   Fluticasone-Umeclidin-Vilant (TRELEGY ELLIPTA) 100-62.5-25 MCG/ACT AEPB Inhale 1 puff into the lungs daily.   furosemide (LASIX) 40 MG tablet Take 3 tablets (120 mg total) by mouth daily. 3 tablets daily and may take 4 tablets if needed   lidocaine (LIDODERM) 5 % Place 1 patch onto the skin daily. Remove & Discard patch within 12 hours or as directed by MD   lovastatin (MEVACOR) 20 MG tablet TAKE 1 TABLET AT BEDTIME   metoprolol tartrate (LOPRESSOR) 50 MG tablet Take 1 tablet (50 mg total) by mouth 2 (two) times daily.   nebivolol (BYSTOLIC) 5 MG tablet Take 1 tablet (5 mg total) by mouth daily.   nitroGLYCERIN (NITROSTAT) 0.4 MG SL tablet Place 1 tablet (0.4 mg total) under the tongue every 5 (five) minutes as needed for chest pain.   potassium chloride SA (KLOR-CON M) 20 MEQ tablet Take 2 tablets daily every other day. Take with lasix   sertraline (ZOLOFT) 100 MG tablet Take 1 tablet (100 mg total) by mouth daily.   tamsulosin (FLOMAX) 0.4 MG CAPS capsule Take 2 capsules (0.8 mg total) by mouth daily after breakfast.   [DISCONTINUED] albuterol (VENTOLIN HFA) 108 (90 Base) MCG/ACT inhaler Inhale 2 puffs into the lungs every 6 (six) hours as needed for wheezing or shortness of breath.   [DISCONTINUED] fluticasone (FLONASE) 50 MCG/ACT nasal spray Place 2 sprays into both nostrils 2 (two) times daily.   albuterol (VENTOLIN HFA) 108 (90 Base) MCG/ACT inhaler Inhale 2 puffs into the lungs every 6 (six) hours as needed for wheezing or shortness of breath.   fluticasone (FLONASE) 50 MCG/ACT nasal spray Place 2 sprays into both nostrils 2 (two) times daily.   No facility-administered encounter medications on file as  of 03/04/2023.    Review of Systems:  Review of Systems  Health Maintenance  Topic Date Due   COVID-19 Vaccine (9 - 2023-24 season) 02/26/2023   INFLUENZA VACCINE  04/25/2023   Medicare Annual Wellness (AWV)  12/20/2023   DTaP/Tdap/Td (2 - Td or Tdap) 02/28/2033   Pneumonia Vaccine 36+ Years old  Completed   Zoster Vaccines- Shingrix  Completed   HPV VACCINES  Aged Out    Physical Exam: Vitals:   03/04/23 1324  BP: 120/82  Pulse: 72  Temp: 98.2 F (36.8 C)  SpO2: 94%  Weight: 164  lb (74.4 kg)  Height: 5\' 11"  (1.803 m)   Body mass index is 22.87 kg/m. Physical Exam  Labs reviewed: Basic Metabolic Panel: Recent Labs    05/31/22 1144 06/08/22 0903 11/22/22 0000 12/10/22 0000  NA 141 142 140  --   K 3.5 3.4* 3.3* 3.8  CL 105 99 103  --   CO2 28  --  30*  --   GLUCOSE 123* 98  --   --   BUN 14 <3* 18  --   CREATININE 0.94 0.90 1.0  --   CALCIUM 9.1  --  9.3  --    Liver Function Tests: Recent Labs    05/31/22 1144 11/22/22 0000  AST 20 15  ALT 13 9*  ALKPHOS 59 71  BILITOT 1.4*  --   PROT 6.9  --   ALBUMIN 4.1 4.3   No results for input(s): "LIPASE", "AMYLASE" in the last 8760 hours. No results for input(s): "AMMONIA" in the last 8760 hours. CBC: Recent Labs    05/31/22 1144 06/08/22 0903 11/22/22 0000  WBC 5.6  --  5.6  HGB 14.2 13.9 14.2  HCT 42.8 41.0 42  MCV 98.2  --   --   PLT 156  --  146*   Lipid Panel: No results for input(s): "CHOL", "HDL", "LDLCALC", "TRIG", "CHOLHDL", "LDLDIRECT" in the last 8760 hours. Lab Results  Component Value Date   HGBA1C 5.7 11/22/2022    Procedures since last visit: CT Head Wo Contrast  Result Date: 03/01/2023 CLINICAL DATA:  Head trauma, moderate-severe. EXAM: CT HEAD WITHOUT CONTRAST CT MAXILLOFACIAL WITHOUT CONTRAST TECHNIQUE: Multidetector CT imaging of the head and maxillofacial structures were performed using the standard protocol without intravenous contrast. Multiplanar CT image reconstructions  of the maxillofacial structures were also generated. RADIATION DOSE REDUCTION: This exam was performed according to the departmental dose-optimization program which includes automated exposure control, adjustment of the mA and/or kV according to patient size and/or use of iterative reconstruction technique. COMPARISON:  None Available. FINDINGS: CT HEAD FINDINGS Brain: No acute intracranial hemorrhage. Age-indeterminate lacunar infarct in the right thalamus (image 16 series 2). Moderate chronic small-vessel disease. Otherwise, gray-white differentiation is preserved. No hydrocephalus or extra-axial collection. No mass effect or midline shift. Vascular: No hyperdense vessel or unexpected calcification. Skull: No calvarial fracture or suspicious bone lesion. Skull base is unremarkable. Other: None. CT MAXILLOFACIAL FINDINGS Osseous: No acute fracture or mandibular dislocation. Severe degenerative changes of the left temporomandibular joint. Orbits: Negative. No traumatic or inflammatory finding. Sinuses: Prior functional endoscopic sinus surgery. Mild mucosal disease in the ethmoid air cells. Soft tissues: Mild soft tissue swelling of the right supraorbital ridge and upper lid. IMPRESSION: 1. No acute intracranial hemorrhage. Age-indeterminate lacunar infarct in the right thalamus. 2. No acute facial bone fracture. Electronically Signed   By: Orvan Falconer M.D.   On: 03/01/2023 11:15   CT MAXILLOFACIAL WO CONTRAST  Result Date: 03/01/2023 CLINICAL DATA:  Head trauma, moderate-severe. EXAM: CT HEAD WITHOUT CONTRAST CT MAXILLOFACIAL WITHOUT CONTRAST TECHNIQUE: Multidetector CT imaging of the head and maxillofacial structures were performed using the standard protocol without intravenous contrast. Multiplanar CT image reconstructions of the maxillofacial structures were also generated. RADIATION DOSE REDUCTION: This exam was performed according to the departmental dose-optimization program which includes automated  exposure control, adjustment of the mA and/or kV according to patient size and/or use of iterative reconstruction technique. COMPARISON:  None Available. FINDINGS: CT HEAD FINDINGS Brain: No acute intracranial hemorrhage. Age-indeterminate lacunar infarct in the  right thalamus (image 16 series 2). Moderate chronic small-vessel disease. Otherwise, gray-white differentiation is preserved. No hydrocephalus or extra-axial collection. No mass effect or midline shift. Vascular: No hyperdense vessel or unexpected calcification. Skull: No calvarial fracture or suspicious bone lesion. Skull base is unremarkable. Other: None. CT MAXILLOFACIAL FINDINGS Osseous: No acute fracture or mandibular dislocation. Severe degenerative changes of the left temporomandibular joint. Orbits: Negative. No traumatic or inflammatory finding. Sinuses: Prior functional endoscopic sinus surgery. Mild mucosal disease in the ethmoid air cells. Soft tissues: Mild soft tissue swelling of the right supraorbital ridge and upper lid. IMPRESSION: 1. No acute intracranial hemorrhage. Age-indeterminate lacunar infarct in the right thalamus. 2. No acute facial bone fracture. Electronically Signed   By: Orvan Falconer M.D.   On: 03/01/2023 11:15    Assessment/Plan Tremor - Plan: Ambulatory referral to Neurology  Anisocoria - Plan: Ambulatory referral to Neurology  Fall, subsequent encounter - Plan: Ambulatory referral to Neurology, Ambulatory referral to Physical Therapy  Post-nasal drip - Plan: fluticasone (FLONASE) 50 MCG/ACT nasal spray  Wheezing - Plan: albuterol (VENTOLIN HFA) 108 (90 Base) MCG/ACT inhaler  Hyperglycemia - Plan: Hemoglobin A1c  Congestive heart failure, unspecified HF chronicity, unspecified heart failure type (HCC) - Plan: Complete Metabolic Panel with eGFR, CBC With Differential/Platelet  Vitamin B 12 deficiency - Plan: Vitamin B12  Dry mouth Patient with recent fall and visit to the emergency department CT head  and face performed w/o fracture. CT head noted age indeterminate lacunar infarct in the right thalamus and patient to have f/u with neurlogy. Also concern for underlying parkinsonism vs parkinson disease leading to his recent difficulties - poor balance, worsening tremor, abnormal gate. PT for gate training and evaluation for need of additional ambulatory devices. He has had anisicoria for many years, however, would like additional information with neurologist as well. Wheezing improved since starting trelegy. Has globus sensation in posterior orpharynx with notable cobble stoning recommend restarting flonase nasal spray. Encouraged hydration for dry mouth, defer new medication at this time.   F/u 6 weeks for labs and check in after additional    Labs/tests ordered:  * No order type specified * Next appt:  Visit date not found   I spent greater than 30 minutes for the care of this patient in face to face time, chart review, clinical documentation, patient education.

## 2023-03-13 ENCOUNTER — Encounter: Payer: Self-pay | Admitting: Student in an Organized Health Care Education/Training Program

## 2023-03-13 ENCOUNTER — Ambulatory Visit (INDEPENDENT_AMBULATORY_CARE_PROVIDER_SITE_OTHER): Payer: Medicare Other | Admitting: Student in an Organized Health Care Education/Training Program

## 2023-03-13 VITALS — BP 118/76 | HR 77 | Temp 97.7°F | Ht 71.0 in | Wt 169.4 lb

## 2023-03-13 DIAGNOSIS — T17908S Unspecified foreign body in respiratory tract, part unspecified causing other injury, sequela: Secondary | ICD-10-CM | POA: Diagnosis not present

## 2023-03-13 NOTE — Progress Notes (Unsigned)
Synopsis: Referred in for shortness of breath by Earnestine Mealing, MD  Assessment & Plan:   #Shortness of Breath #Aspiration into airway, sequela  Presents for the evaluation of shortness of breath which I believe is multi-factorial. While patient doesn't have a personal history of smoking, he's had some second hand exposure in his history. On my review, spirometry does not suggest obstruction, with normal FEV1 and FVC.  - Ambulatory referral to Speech Therapy   Return if symptoms worsen or fail to improve.  I spent *** minutes caring for this patient today, including {EM billing:[id number]}  Raechel Chute, MD  Pulmonary Critical Care 03/13/2023 5:07 PM    End of visit medications:  No orders of the defined types were placed in this encounter.    Current Outpatient Medications:    acetaminophen (TYLENOL) 500 MG tablet, Take 500 mg by mouth every 8 (eight) hours as needed., Disp: , Rfl:    albuterol (VENTOLIN HFA) 108 (90 Base) MCG/ACT inhaler, Inhale 2 puffs into the lungs every 6 (six) hours as needed for wheezing or shortness of breath., Disp: 8 g, Rfl: 3   apixaban (ELIQUIS) 5 MG TABS tablet, Take 1 tablet (5 mg total) by mouth 2 (two) times daily., Disp: 180 tablet, Rfl: 3   atenolol (TENORMIN) 50 MG tablet, Take 1 tablet (50 mg total) by mouth daily., Disp: 30 tablet, Rfl: 0   bisoprolol (ZEBETA) 5 MG tablet, Take 1 tablet (5 mg total) by mouth daily., Disp: 30 tablet, Rfl: 0   CVS ZINC 50 MG TABS, Take 50 mg by mouth daily., Disp: , Rfl:    diclofenac (VOLTAREN) 75 MG EC tablet, Take 1 tablet (75 mg total) by mouth 2 (two) times daily., Disp: 30 tablet, Rfl: 0   finasteride (PROSCAR) 5 MG tablet, Take 1 tablet (5 mg total) by mouth daily., Disp: 90 tablet, Rfl: 3   fluticasone (FLONASE) 50 MCG/ACT nasal spray, Place 2 sprays into both nostrils 2 (two) times daily., Disp: 18 mL, Rfl: 3   Fluticasone-Umeclidin-Vilant (TRELEGY ELLIPTA) 100-62.5-25 MCG/ACT AEPB, Inhale 1  puff into the lungs daily., Disp: 3 each, Rfl: 4   furosemide (LASIX) 40 MG tablet, Take 3 tablets (120 mg total) by mouth daily. 3 tablets daily and may take 4 tablets if needed, Disp: 270 tablet, Rfl: 3   lidocaine (LIDODERM) 5 %, Place 1 patch onto the skin daily. Remove & Discard patch within 12 hours or as directed by MD, Disp: 30 patch, Rfl: 0   lovastatin (MEVACOR) 20 MG tablet, TAKE 1 TABLET AT BEDTIME, Disp: 90 tablet, Rfl: 3   metoprolol tartrate (LOPRESSOR) 50 MG tablet, Take 1 tablet (50 mg total) by mouth 2 (two) times daily., Disp: 180 tablet, Rfl: 3   nebivolol (BYSTOLIC) 5 MG tablet, Take 1 tablet (5 mg total) by mouth daily., Disp: 30 tablet, Rfl: 0   nitroGLYCERIN (NITROSTAT) 0.4 MG SL tablet, Place 1 tablet (0.4 mg total) under the tongue every 5 (five) minutes as needed for chest pain., Disp: 25 tablet, Rfl: 1   potassium chloride SA (KLOR-CON M) 20 MEQ tablet, Take 2 tablets daily every other day. Take with lasix, Disp: 135 tablet, Rfl: 2   sertraline (ZOLOFT) 100 MG tablet, Take 1 tablet (100 mg total) by mouth daily., Disp: 90 tablet, Rfl: 3   tamsulosin (FLOMAX) 0.4 MG CAPS capsule, Take 2 capsules (0.8 mg total) by mouth daily after breakfast., Disp: 180 capsule, Rfl: 3   Subjective:   PATIENT ID: Kyle Morales  [place] GENDER: male DOB: 1935-11-19, MRN: [id number]  Chief Complaint  Patient presents with   pulmonary consult    PFT 12/13/22. SOB with exertion and prod cough with clear to yellow sputum.     HPI  Kyle Morales is a pleasant 87 year old male presenting to clinic for the evaluation of shortness of breath.  He reports symptoms that have been ongoing for many years and does not feel that there has been much change.  He reports a cough that is sometimes productive of clear sputum but his main symptom is exertional dyspnea.  He has been seen by his primary care physician and was prescribed Trelegy in addition to albuterol that he uses throughout the day.  He feels some  improvement after Trelegy was initiated.  Patient denies wheezing, chest pain, chest tightness, hemoptysis, night sweats, weight loss, fevers, and chills.  He does report coughing sometimes when he eats and does endorse some aspiration.  He tells me that he was drinking orange juice this morning and that made him cough a little bit.  He has had a swallowing study in November 2023 showing silent aspiration.  Patient also reports falls around 1-2 times a year.  He has a tremor and has an upcoming visit with a neurologist.  He also follows with cardiology for CAD, Afib, and complete heart block s/p pacemaker placement. He's also been seen by ENT at [facility] where he is s/p left maxillectomy 08/2022. He's also been evaluated for dysphagia and dysphonia by ENT.  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 No other exposures reported.   Ancillary information including prior medications, full medical/surgical/family/social histories, and PFTs (when available) are listed below and have been reviewed.   Review of Systems  Constitutional:  Negative for chills, fever and weight loss.  HENT:  Negative for hearing loss.   Respiratory:  Positive for cough, sputum production and shortness of breath.   Cardiovascular:  Negative for chest pain.  Skin:  Negative for rash.     Objective:   Vitals:   03/13/23 1359  BP: 118/76  Pulse: 77  Temp: 97.7 F (36.5 C)  TempSrc: Temporal  SpO2: 97%  Weight: 169 lb 6.4 oz (76.8 kg)  Height: 5\' 11"  (1.803 m)   97% on RA  BMI Readings from Last 3 Encounters:  03/13/23 23.63 kg/m  03/04/23 22.87 kg/m  03/01/23 23.98 kg/m   Wt Readings from Last 3 Encounters:  03/13/23 169 lb 6.4 oz (76.8  kg)  03/04/23 164 lb (74.4 kg)  03/01/23 171 lb 15.3 oz (78 kg)    Physical Exam Constitutional:      Appearance: He is not ill-appearing.  HENT:     Mouth/Throat:     Mouth: Mucous membranes are moist.  Cardiovascular:     Rate and Rhythm: Normal rate and regular rhythm.     Pulses: Normal pulses.     Heart sounds: Normal heart sounds.  Pulmonary:     Effort: Pulmonary effort is normal.     Breath sounds: Normal breath sounds. No wheezing or rales.  Neurological:     General: No focal deficit present.     Mental Status: He is alert and oriented to person, place,  and time. Mental status is at baseline.       Ancillary Information    Past Medical History:  Diagnosis Date   Aortic atherosclerosis (HCC)    Aortic root dilation (HCC)    a.) TTE [id number]: Ao root measured 39 mm.   BPH (benign prostatic hyperplasia)    CHF (congestive heart failure) (HCC)    Coronary artery disease    a.) remote PCI of the RI in 1997; stent (unknown type) placed.   Depression    Diverticulosis    Eczema    GERD (gastroesophageal reflux disease)    Hyperlipidemia    Hypertension    Inguinal hernia, left    Long term current use of anticoagulant    a.) apixaban   Nephrolithiasis    NSVT (nonsustained ventricular tachycardia) (HCC)    Osteoarthrosis    Paroxysmal A-fib (HCC)    a.) CHA2DS2VASc = 5 (age x2, CHF, HTN, vascular disease history);  b.) rate/rhythm maintained on oral metoprolol succinate; chronically anticoagulated with apixaban   Pharyngeal dysphagia    Presence of permanent cardiac pacemaker 2007   a.) Medtronic device placed in [place] in 2007; b.) generator changed at [facility] on 11/18/2015   RBBB    SSS (sick sinus syndrome) (HCC) 2007   a.) s/p PPM placement in [place] in 2007     Family History  Problem Relation Age of Onset   Heart disease Mother    Hypertension Mother    Hyperlipidemia Mother    Heart attack Father    Heart disease Brother      Past  Surgical History:  Procedure Laterality Date   BLEPHAROPLASTY Bilateral    CARDIAC CATHETERIZATION  09/25/1995   CARDIAC CATHETERIZATION  09/24/1998   CHOLECYSTECTOMY     COLONOSCOPY  09/24/2010   CORONARY ANGIOPLASTY  09/25/1995   ramus   ESOPHAGOGASTRODUODENOSCOPY N/A 02/11/2017   Procedure: ESOPHAGOGASTRODUODENOSCOPY (EGD);  Surgeon: Christena Deem, MD;  Location: [facility] ENDOSCOPY;  Service: Endoscopy;  Laterality: N/A;   INGUINAL HERNIA REPAIR Left 1977   INSERT / REPLACE / REMOVE PACEMAKER  12/23/2005   Medtronic [id number] - placed in [place]   INSERTION OF MESH Left 06/08/2022   Procedure: INSERTION OF MESH;  Surgeon: Campbell Lerner, MD;  Location: [facility] ORS;  Service: General;  Laterality: Left;   MANDIBLE SURGERY  1966   PACEMAKER GENERATOR CHANGE N/A 11/18/2015   ROBOTIC ASSISTED LAPAROSCOPIC CHOLECYSTECTOMY  01/12/2022   TONSILLECTOMY     TOTAL HIP ARTHROPLASTY Right 2022   TOTAL SHOULDER REPLACEMENT     bilateral approx 2020 and 2021    Social History   Socioeconomic History   Marital status: Married    Spouse name: Not on file   Number of children: Not on file   Years of education: Not on file   Highest education level: Not on file  Occupational History   Not on file  Tobacco Use   Smoking status: Never   Smokeless tobacco: Never  Vaping Use   Vaping Use: Never used  Substance and Sexual Activity   Alcohol use: Yes    Comment: 1 beer monthly, mixed drink 2-3 times yearly   Drug use: No   Sexual activity: Not on file  Other Topics Concern   Not on file  Social History Narrative   Not on file   Social Determinants of Health   Financial Resource Strain: Not on file  Food Insecurity: Not on file  Transportation Needs: Not on file  Physical Activity: Not on  file  Stress: Not on file  Social Connections: Not on file  Intimate Partner Violence: Not on file     Allergies  Allergen Reactions   Amoxicillin Rash and Itching   Bactrim  [Sulfamethoxazole-Trimethoprim] Rash     CBC    Component Value Date/Time   WBC 5.6 11/22/2022 0000   WBC 5.6 05/31/2022 1144   RBC 4.32 11/22/2022 0000   HGB 14.2 11/22/2022 0000   HGB 14.1 03/14/2021 1117   HCT 42 11/22/2022 0000   HCT 41.5 03/14/2021 1117   PLT 146 (A) 11/22/2022 0000   PLT 167 03/14/2021 1117   MCV 98.2 05/31/2022 1144   MCV 98 (H) 03/14/2021 1117   MCV 96 09/22/2014 1310   MCH 32.6 05/31/2022 1144   MCHC 33.2 05/31/2022 1144   RDW 12.9 05/31/2022 1144   RDW 12.9 03/14/2021 1117   RDW 13.7 09/22/2014 1310   LYMPHSABS 1.3 08/21/2021 1343   LYMPHSABS 1.1 03/14/2021 1117   LYMPHSABS 1.1 09/22/2014 1310   MONOABS 0.7 08/21/2021 1343   MONOABS 0.7 09/22/2014 1310   EOSABS 0.4 08/21/2021 1343   EOSABS 0.6 (H) 03/14/2021 1117   EOSABS 0.4 09/22/2014 1310   BASOSABS 0.0 08/21/2021 1343   BASOSABS 0.0 03/14/2021 1117   BASOSABS 0.1 09/22/2014 1310    Pulmonary Functions Testing Results:    Latest Ref Rng & Units 12/13/2022    1:47 PM  PFT Results  FVC-Pre L 2.89   FVC-Predicted Pre % 71   FVC-Post L 3.12   FVC-Predicted Post % 77   Pre FEV1/FVC % % 78   Post FEV1/FCV % % 77   FEV1-Pre L 2.24   FEV1-Predicted Pre % 79   FEV1-Post L 2.40   DLCO uncorrected ml/min/mmHg 12.14   DLCO UNC% % 49   DLVA Predicted % 58   TLC L 5.65   TLC % Predicted % 77   RV % Predicted % 87     Outpatient Medications Prior to Visit  Medication Sig Dispense Refill   acetaminophen (TYLENOL) 500 MG tablet Take 500 mg by mouth every 8 (eight) hours as needed.     albuterol (VENTOLIN HFA) 108 (90 Base) MCG/ACT inhaler Inhale 2 puffs into the lungs every 6 (six) hours as needed for wheezing or shortness of breath. 8 g 3   apixaban (ELIQUIS) 5 MG TABS tablet Take 1 tablet (5 mg total) by mouth 2 (two) times daily. 180 tablet 3   atenolol (TENORMIN) 50 MG tablet Take 1 tablet (50 mg total) by mouth daily. 30 tablet 0   bisoprolol (ZEBETA) 5 MG tablet Take 1 tablet (5 mg  total) by mouth daily. 30 tablet 0   CVS ZINC 50 MG TABS Take 50 mg by mouth daily.     diclofenac (VOLTAREN) 75 MG EC tablet Take 1 tablet (75 mg total) by mouth 2 (two) times daily. 30 tablet 0   finasteride (PROSCAR) 5 MG tablet Take 1 tablet (5 mg total) by mouth daily. 90 tablet 3   fluticasone (FLONASE) 50 MCG/ACT nasal spray Place 2 sprays into both nostrils 2 (two) times daily. 18 mL 3   Fluticasone-Umeclidin-Vilant (TRELEGY ELLIPTA) 100-62.5-25 MCG/ACT AEPB Inhale 1 puff into the lungs daily. 3 each 4   furosemide (LASIX) 40 MG tablet Take 3 tablets (120 mg total) by mouth daily. 3 tablets daily and may take 4 tablets if needed 270 tablet 3   lidocaine (LIDODERM) 5 % Place 1 patch onto the skin daily. Remove &  Discard patch within 12 hours or as directed by MD 30 patch 0   lovastatin (MEVACOR) 20 MG tablet TAKE 1 TABLET AT BEDTIME 90 tablet 3   metoprolol tartrate (LOPRESSOR) 50 MG tablet Take 1 tablet (50 mg total) by mouth 2 (two) times daily. 180 tablet 3   nebivolol (BYSTOLIC) 5 MG tablet Take 1 tablet (5 mg total) by mouth daily. 30 tablet 0   nitroGLYCERIN (NITROSTAT) 0.4 MG SL tablet Place 1 tablet (0.4 mg total) under the tongue every 5 (five) minutes as needed for chest pain. 25 tablet 1   potassium chloride SA (KLOR-CON M) 20 MEQ tablet Take 2 tablets daily every other day. Take with lasix 135 tablet 2   sertraline (ZOLOFT) 100 MG tablet Take 1 tablet (100 mg total) by mouth daily. 90 tablet 3   tamsulosin (FLOMAX) 0.4 MG CAPS capsule Take 2 capsules (0.8 mg total) by mouth daily after breakfast. 180 capsule 3   No facility-administered medications prior to visit.

## 2023-03-21 NOTE — Progress Notes (Signed)
Cardiology Office Note  Date:  03/22/2023   ID:  Kyle Morales, Kyle Morales 03/25/1936, MRN [id number]  PCP:  Earnestine Mealing, MD   Chief Complaint  Patient presents with   Shortness of Breath    Patient would like a Rx for bisoprolol; this medications seemed to work best.  Patient c/o shortness of breath with bending over to tie shoes and with exertion.      HPI:  [name] is a  87 year-old gentleman history of  paroxysmal atrial fibrillation dating back to 1992   coronary artery disease with stent placement at [facility] in 1997,  sinus node dysfunction pacemaker placement in [place] in 2007 for diagnosis of heart block chronic diarrhea Pacemaker change out done at [facility],  Who presents for followup of his paroxysmal atrial fibrillation and coronary artery disease.  LOV with myself January 2024 Seen by EP February 2024  In follow-up today reports he has tried several different beta-blockers including atenolol, metoprolol, bystolic and bisoprolol Less fatigue on bisoprolol 5 daily, he would like prescription for bisoprolol  Reports he is asymptomatic from his atrial fibrillation Seen by pulmonary, concern for aspiration, on trelegy, atrovent  Does PT at [facility] They do leg strengthening, reports he does a little bit of recumbent bike  Lots of coughing and tremor today in the office  Continues on Lasix 120 daily with potassium 20 Stable renal function several months ago, slight trend up in BUN  Pacer downloads reviewed, 19 hours of A-fib at a time Typically has low A-fib burden  Last year with numerous surgeries requiring general anesthesia Back injections,  Gall bladder surgery, was urgent Hernia repair surgery Left sinus cavity polyp tumor resected Slow recovery, weakness  EKG personally reviewed by myself on todays visit EKG Interpretation Date/Time:  Friday March 22 2023 08:52:23 EDT Ventricular Rate:  74 PR Interval:  272 QRS Duration:  116 QT Interval:  444 QTC  Calculation: 492 R Axis:   1  Text Interpretation: Atrial-paced rhythm with prolonged AV conduction Incomplete right bundle branch block ST & T wave abnormality, consider anterolateral ischemia When compared with ECG of 10-Jan-2022 20:37, PREVIOUS ECG IS PRESENT Confirmed by Kyle Morales [phone]) on 03/22/2023 9:25:04 AM    With past medical history reviewed  hip surgery 12/20/20 at [facility] Notes reviewed, EKGs indicating he developed accelerated junctional rhythm  ECG: 12/23/20 atrial tach, RBBB, single V paced complex Cardiology was consulted at [facility], they reported he had atrial tachycardia Initially placed on diltiazem and metoprolol, metoprolol was held for low blood pressure  Echo at [facility]   2. Echo contrast utilized to enhance endocardial border definition.    3. The left ventricular systolic function is normal, LVEF is visually  estimated at 55-60%.    4. The right ventricle is not well visualized but probably normal in size,  with normal systolic function.   Went to [facility] 10 days for PT  Prior history of UTI, went to [facility] treated with ABX, enterococcus Faecalis in the urine culture  Echocardiogram March 14, 2017 Results discussed with him in detail Ejection fraction 50-55%, mildly dilated aortic root 39 mm  Left atrium severely dilated, mild to moderate MR   Previous history of atrial fibrillation in the setting of GI distress and diarrhea   cultures were negative. Managed by GI   PMH:   has a past medical history of Aortic atherosclerosis (HCC), Aortic root dilation (HCC), BPH (benign prostatic hyperplasia), CHF (congestive heart failure) (HCC), Coronary artery disease,  Depression, Diverticulosis, Eczema, GERD (gastroesophageal reflux disease), Hyperlipidemia, Hypertension, Inguinal hernia, left, Long term current use of anticoagulant, Nephrolithiasis, NSVT (nonsustained ventricular tachycardia) (HCC), Osteoarthrosis, Paroxysmal A-fib (HCC), Pharyngeal dysphagia, Presence of  permanent cardiac pacemaker (2007), RBBB, and SSS (sick sinus syndrome) (HCC) (2007).  PSH:    Past Surgical History:  Procedure Laterality Date   BLEPHAROPLASTY Bilateral    CARDIAC CATHETERIZATION  09/25/1995   CARDIAC CATHETERIZATION  09/24/1998   CHOLECYSTECTOMY     COLONOSCOPY  09/24/2010   CORONARY ANGIOPLASTY  09/25/1995   ramus   ESOPHAGOGASTRODUODENOSCOPY N/A 02/11/2017   Procedure: ESOPHAGOGASTRODUODENOSCOPY (EGD);  Surgeon: Christena Deem, MD;  Location: [facility] ENDOSCOPY;  Service: Endoscopy;  Laterality: N/A;   INGUINAL HERNIA REPAIR Left 1977   INSERT / REPLACE / REMOVE PACEMAKER  12/23/2005   Medtronic [id number] H - placed in [place]   INSERTION OF MESH Left 06/08/2022   Procedure: INSERTION OF MESH;  Surgeon: Campbell Lerner, MD;  Location: [facility] ORS;  Service: General;  Laterality: Left;   MANDIBLE SURGERY  1966   PACEMAKER GENERATOR CHANGE N/A 11/18/2015   ROBOTIC ASSISTED LAPAROSCOPIC CHOLECYSTECTOMY  01/12/2022   TONSILLECTOMY     TOTAL HIP ARTHROPLASTY Right 2022   TOTAL SHOULDER REPLACEMENT     bilateral approx 2020 and 2021    Current Outpatient Medications  Medication Sig Dispense Refill   acetaminophen (TYLENOL) 500 MG tablet Take 500 mg by mouth every 8 (eight) hours as needed.     albuterol (VENTOLIN HFA) 108 (90 Base) MCG/ACT inhaler Inhale 2 puffs into the lungs every 6 (six) hours as needed for wheezing or shortness of breath. 8 g 3   apixaban (ELIQUIS) 5 MG TABS tablet Take 1 tablet (5 mg total) by mouth 2 (two) times daily. 180 tablet 3   CVS ZINC 50 MG TABS Take 50 mg by mouth daily.     diclofenac (VOLTAREN) 75 MG EC tablet Take 1 tablet (75 mg total) by mouth 2 (two) times daily. 30 tablet 0   finasteride (PROSCAR) 5 MG tablet Take 1 tablet (5 mg total) by mouth daily. 90 tablet 3   fluticasone (FLONASE) 50 MCG/ACT nasal spray Place 2 sprays into both nostrils 2 (two) times daily. 18 mL 3   Fluticasone-Umeclidin-Vilant (TRELEGY ELLIPTA)  100-62.5-25 MCG/ACT AEPB Inhale 1 puff into the lungs daily. 3 each 4   furosemide (LASIX) 40 MG tablet Take 3 tablets (120 mg total) by mouth daily. 3 tablets daily and may take 4 tablets if needed 270 tablet 3   lidocaine (LIDODERM) 5 % Place 1 patch onto the skin daily. Remove & Discard patch within 12 hours or as directed by MD 30 patch 0   lovastatin (MEVACOR) 20 MG tablet TAKE 1 TABLET AT BEDTIME 90 tablet 3   metoprolol tartrate (LOPRESSOR) 50 MG tablet Take 1 tablet (50 mg total) by mouth 2 (two) times daily. 180 tablet 3   nitroGLYCERIN (NITROSTAT) 0.4 MG SL tablet Place 1 tablet (0.4 mg total) under the tongue every 5 (five) minutes as needed for chest pain. 25 tablet 1   potassium chloride SA (KLOR-CON M) 20 MEQ tablet Take 2 tablets daily every other day. Take with lasix 135 tablet 2   sertraline (ZOLOFT) 100 MG tablet Take 1 tablet (100 mg total) by mouth daily. 90 tablet 3   tamsulosin (FLOMAX) 0.4 MG CAPS capsule Take 2 capsules (0.8 mg total) by mouth daily after breakfast. 180 capsule 3   atenolol (TENORMIN) 50 MG tablet Take 1 tablet (50  mg total) by mouth daily. (Patient not taking: Reported on 03/22/2023) 30 tablet 0   bisoprolol (ZEBETA) 5 MG tablet Take 1 tablet (5 mg total) by mouth daily. (Patient not taking: Reported on 03/22/2023) 30 tablet 0   nebivolol (BYSTOLIC) 5 MG tablet Take 1 tablet (5 mg total) by mouth daily. (Patient not taking: Reported on 03/22/2023) 30 tablet 0   No current facility-administered medications for this visit.    Allergies:   Amoxicillin and Bactrim [sulfamethoxazole-trimethoprim]   Social History:  The patient  reports that he has never smoked. He has never used smokeless tobacco. He reports current alcohol use. He reports that he does not use drugs.   Family History:   family history includes Heart attack in his father; Heart disease in his brother and mother; Hyperlipidemia in his mother; Hypertension in his mother.    Review of  Systems: Review of Systems  HENT: Negative.    Respiratory:  Positive for cough and shortness of breath.   Cardiovascular: Negative.   Gastrointestinal: Negative.   Musculoskeletal: Negative.        Leg weakness  Neurological:  Positive for tremors.  Psychiatric/Behavioral: Negative.    All other systems reviewed and are negative.   PHYSICAL EXAM: VS:  BP 110/66 (BP Location: Left Arm, Patient Position: Sitting, Cuff Size: Large)   Pulse 74   Ht 5\' 11"  (1.803 m)   Wt 171 lb 2 oz (77.6 kg)   SpO2 97%   BMI 23.87 kg/m  , BMI Body mass index is 23.87 kg/m. Constitutional:  oriented to person, place, and time. No distress.  HENT:  Head: Grossly normal Eyes:  no discharge. No scleral icterus.  Neck: No JVD, no carotid bruits  Cardiovascular: Regular rate and rhythm, no murmurs appreciated Pulmonary/Chest: Clear to auscultation bilaterally, no wheezes  Scattered Rales at the bases Abdominal: Soft.  no distension.  no tenderness.  Musculoskeletal: Normal range of motion Neurological:  normal muscle tone. Coordination normal. No atrophy Skin: Skin warm and dry Psychiatric: normal affect, pleasant   Recent Labs: 11/22/2022: ALT 9; BUN 18; Creatinine 1.0; Hemoglobin 14.2; Platelets 146; Sodium 140 12/10/2022: Potassium 3.8    Lipid Panel Lab Results  Component Value Date   CHOL 136 02/20/2017   HDL 32 (L) 02/20/2017   LDLCALC 70 02/20/2017   TRIG 172 (H) 02/20/2017      Wt Readings from Last 3 Encounters:  03/22/23 171 lb 2 oz (77.6 kg)  03/13/23 169 lb 6.4 oz (76.8 kg)  03/04/23 164 lb (74.4 kg)     ASSESSMENT AND PLAN:  Atrial fibrillation, unspecified type (HCC) - Low A-fib burden, asymptomatic On eliquis, no sx Bisoprolol 5 daily  Nonsustained VT Monitored by EP Rare VT  Shortness of breath Low normal ejection fraction 50 to 55%, borderline elevated right heart pressures on Lasix 120 daily Deconditioned, recommend he continue PT, only doing 2 days a  week, use the exercise facilities on the other 5 days as energy tolerates  Pure hypercholesterolemia On lovastatin,  Cholesterol typically at goal  Atherosclerosis of native coronary artery of native heart  Currently with no symptoms of angina. No further workup at this time. Continue current medication regimen.  Orthostatic hypotension Blood pressure stable, changed to bisoprolol 5 mg daily given less fatigue after trying 4 different beta-blockers  Leg swelling Minimal edema on today's visit, continue Lasix 120 daily  Hypothyroidism Followed by primary care   Total encounter time more than 30 minutes  Greater than 50%  was spent in counseling and coordination of care with the patient   Orders Placed This Encounter  Procedures   EKG 12-Lead     Signed, Dossie Arbour, M.D., Ph.D. 03/22/2023  [facility] [place], [place] [phone]

## 2023-03-22 ENCOUNTER — Ambulatory Visit: Payer: Medicare Other | Attending: Cardiovascular Disease | Admitting: Cardiovascular Disease

## 2023-03-22 ENCOUNTER — Encounter: Payer: Self-pay | Admitting: Cardiovascular Disease

## 2023-03-22 VITALS — BP 110/66 | HR 74 | Ht 71.0 in | Wt 171.1 lb

## 2023-03-22 DIAGNOSIS — I77819 Aortic ectasia, unspecified site: Secondary | ICD-10-CM | POA: Diagnosis present

## 2023-03-22 DIAGNOSIS — I5032 Chronic diastolic (congestive) heart failure: Secondary | ICD-10-CM

## 2023-03-22 DIAGNOSIS — I25118 Atherosclerotic heart disease of native coronary artery with other forms of angina pectoris: Secondary | ICD-10-CM | POA: Diagnosis present

## 2023-03-22 DIAGNOSIS — I2510 Atherosclerotic heart disease of native coronary artery without angina pectoris: Secondary | ICD-10-CM

## 2023-03-22 DIAGNOSIS — R0602 Shortness of breath: Secondary | ICD-10-CM

## 2023-03-22 DIAGNOSIS — Z950 Presence of cardiac pacemaker: Secondary | ICD-10-CM

## 2023-03-22 DIAGNOSIS — E782 Mixed hyperlipidemia: Secondary | ICD-10-CM | POA: Diagnosis present

## 2023-03-22 DIAGNOSIS — I495 Sick sinus syndrome: Secondary | ICD-10-CM

## 2023-03-22 DIAGNOSIS — I10 Essential (primary) hypertension: Secondary | ICD-10-CM

## 2023-03-22 DIAGNOSIS — I482 Chronic atrial fibrillation, unspecified: Secondary | ICD-10-CM

## 2023-03-22 MED ORDER — BISOPROLOL FUMARATE 5 MG PO TABS: 5.0000 mg | ORAL_TABLET | Freq: Every day | ORAL | 3 refills | Status: DC

## 2023-03-22 NOTE — Patient Instructions (Signed)
Medication Instructions:  No changes  If you need a refill on your cardiac medications before your next appointment, please call your pharmacy.   Lab work: No new labs needed  Testing/Procedures: No new testing needed  Follow-Up: At [facility], you and your health needs are our priority.  As part of our continuing mission to provide you with exceptional heart care, we have created designated Provider Care Teams.  These Care Teams include your primary Cardiologist (physician) and Advanced Practice Providers (APPs -  Physician Assistants and Nurse Practitioners) who all work together to provide you with the care you need, when you need it.  You will need a follow up appointment in 12 months  Providers on your designated Care Team:   Christopher Berge, NP Ryan Dunn, PA-C Cadence Furth, PA-C  COVID-19 Vaccine Information can be found at: [email or web address] For questions related to vaccine distribution or appointments, please email [email or web address] or call [phone].   

## 2023-04-01 ENCOUNTER — Telehealth: Payer: Self-pay | Admitting: Student

## 2023-04-01 DIAGNOSIS — C44310 Basal cell carcinoma of skin of unspecified parts of face: Secondary | ICD-10-CM

## 2023-04-01 NOTE — Telephone Encounter (Signed)
I have LM at [facility]  to see if they do Mohs procedures. It would also be helpful to obtain the notes from [facility] so they don't have to start the process all over again.

## 2023-04-01 NOTE — Telephone Encounter (Signed)
Patient's wife, Marsh Dolly, called and stated that her husband saw the [facility] group at [facility] and they referred him to [facility] who does not do the Mohs procedure. They would like a new referral for a dermatologist in [place], but if there is none available they would like him seen at [facility] in [facility], Dr. Charlott Rakes [place] (ph: [phone]), as the patient's wife has had the Mohs procedure there. They would like this done ASAP since several weeks have already been wasted and the place on his face is getting worse.

## 2023-04-01 NOTE — Addendum Note (Signed)
Addended by: Earnestine Mealing on: 04/01/2023 03:46 PM   Modules accepted: Orders

## 2023-04-02 NOTE — Telephone Encounter (Signed)
I have faxed over the referral to  Dr. Mckinley Jewel office

## 2023-04-10 ENCOUNTER — Telehealth: Payer: Self-pay | Admitting: Cardiovascular Disease

## 2023-04-10 NOTE — Telephone Encounter (Signed)
Called and spoke with patient's wife. Patient with complaint of increase shortness of breath times for 2- 3 days. Wife reports blood pressure 117/74, heart rate 72 and 94% oxygen saturation. Patient denies swelling or chest pain. Patient scheduled with  Sherie Don, NP for 04/11/23. Patient advised to go to the [facility] department for any new or worsening symptoms.

## 2023-04-10 NOTE — Telephone Encounter (Signed)
Pt c/o Shortness Of Breath: STAT if SOB developed within the last 24 hours or pt is noticeably SOB on the phone  1. Are you currently SOB (can you hear that pt is SOB on the phone)? No   2. How long have you been experiencing SOB? 2-3 days   3. Are you SOB when sitting or when up moving around? Moving around   4. Are you currently experiencing any other symptoms?  No. She states his bp has been normal and hr is 72 and 94% O2

## 2023-04-11 ENCOUNTER — Ambulatory Visit: Payer: Medicare Other | Attending: Cardiology | Admitting: Cardiology

## 2023-04-11 ENCOUNTER — Ambulatory Visit (INDEPENDENT_AMBULATORY_CARE_PROVIDER_SITE_OTHER): Payer: Medicare Other | Admitting: Podiatry

## 2023-04-11 ENCOUNTER — Other Ambulatory Visit
Admission: RE | Admit: 2023-04-11 | Discharge: 2023-04-11 | Disposition: A | Discharge status: Home / Self Care | Payer: Medicare Other | Source: Ambulatory Visit | Attending: Cardiology | Admitting: Cardiology

## 2023-04-11 ENCOUNTER — Encounter: Payer: Self-pay | Admitting: Podiatry

## 2023-04-11 ENCOUNTER — Telehealth: Payer: Self-pay | Admitting: *Deleted

## 2023-04-11 ENCOUNTER — Other Ambulatory Visit: Payer: Self-pay | Admitting: Student

## 2023-04-11 ENCOUNTER — Encounter: Payer: Self-pay | Admitting: Cardiology

## 2023-04-11 ENCOUNTER — Telehealth: Payer: Self-pay

## 2023-04-11 VITALS — BP 122/80 | HR 77 | Ht 71.0 in | Wt 171.0 lb

## 2023-04-11 VITALS — BP 116/76

## 2023-04-11 DIAGNOSIS — M79675 Pain in left toe(s): Secondary | ICD-10-CM | POA: Diagnosis not present

## 2023-04-11 DIAGNOSIS — I5032 Chronic diastolic (congestive) heart failure: Secondary | ICD-10-CM | POA: Diagnosis present

## 2023-04-11 DIAGNOSIS — I480 Paroxysmal atrial fibrillation: Secondary | ICD-10-CM | POA: Insufficient documentation

## 2023-04-11 DIAGNOSIS — I4729 Other ventricular tachycardia: Secondary | ICD-10-CM

## 2023-04-11 DIAGNOSIS — I482 Chronic atrial fibrillation, unspecified: Secondary | ICD-10-CM

## 2023-04-11 DIAGNOSIS — L84 Corns and callosities: Secondary | ICD-10-CM

## 2023-04-11 DIAGNOSIS — I739 Peripheral vascular disease, unspecified: Secondary | ICD-10-CM

## 2023-04-11 DIAGNOSIS — Z950 Presence of cardiac pacemaker: Secondary | ICD-10-CM | POA: Diagnosis present

## 2023-04-11 DIAGNOSIS — M2041 Other hammer toe(s) (acquired), right foot: Secondary | ICD-10-CM

## 2023-04-11 DIAGNOSIS — D6869 Other thrombophilia: Secondary | ICD-10-CM

## 2023-04-11 DIAGNOSIS — I495 Sick sinus syndrome: Secondary | ICD-10-CM

## 2023-04-11 DIAGNOSIS — R0602 Shortness of breath: Secondary | ICD-10-CM

## 2023-04-11 DIAGNOSIS — B351 Tinea unguium: Secondary | ICD-10-CM | POA: Diagnosis not present

## 2023-04-11 DIAGNOSIS — M2042 Other hammer toe(s) (acquired), left foot: Secondary | ICD-10-CM

## 2023-04-11 DIAGNOSIS — M79674 Pain in right toe(s): Secondary | ICD-10-CM

## 2023-04-11 LAB — BASIC METABOLIC PANEL
Anion gap: 7 (ref 5–15)
BUN: 16 mg/dL (ref 8–23)
CO2: 26 mmol/L (ref 22–32)
Calcium: 8.8 mg/dL — ABNORMAL LOW (ref 8.9–10.3)
Chloride: 107 mmol/L (ref 98–111)
Creatinine, Ser: 1.02 mg/dL (ref 0.61–1.24)
GFR, Estimated: >60 mL/min (ref >60)
Glucose, Bld: 114 mg/dL — ABNORMAL HIGH (ref 70–99)
Potassium: 3.3 mmol/L — ABNORMAL LOW (ref 3.5–5.1)
Sodium: 140 mmol/L (ref 135–145)

## 2023-04-11 LAB — CBC
HCT: 44 % (ref 39.0–52.0)
Hemoglobin: 14.3 g/dL (ref 13.0–17.0)
MCH: 32.1 pg (ref 26.0–34.0)
MCHC: 32.5 g/dL (ref 30.0–36.0)
MCV: 98.7 fL (ref 80.0–100.0)
Platelets: 131 10*3/uL — ABNORMAL LOW (ref 150–400)
RBC: 4.46 MIL/uL (ref 4.22–5.81)
RDW: 13.2 % (ref 11.5–15.5)
WBC: 5.4 10*3/uL (ref 4.0–10.5)
nRBC: 0 % (ref 0.0–0.2)

## 2023-04-11 LAB — CUP PACEART INCLINIC DEVICE CHECK
Date Time Interrogation Session: 20240718111837
Implantable Lead Connection Status: 753985
Implantable Lead Connection Status: 753985
Implantable Lead Implant Date: 20070411
Implantable Lead Implant Date: 20070411
Implantable Lead Location: 753859
Implantable Lead Location: 753860
Implantable Lead Model: 5076
Implantable Lead Model: 5076
Implantable Pulse Generator Implant Date: 20170224

## 2023-04-11 NOTE — Progress Notes (Signed)
Cardiology Office Note Date:  04/11/2023  Patient ID:  Kyle, Morales 11-11-1935, MRN [id number] PCP:  Earnestine Mealing, MD  Cardiologist:  Julien Nordmann, MD Electrophysiologist: Sherryl Manges, MD    Chief Complaint: SOB  History of Present Illness: Kyle Morales is a 87 y.o. male with PMH notable for SND s/p PPM, afib, CAD s/p PCI, parox AFib, NSVT, fatigue; seen today for Sherryl Manges, MD for acute visit due to SOB.    Saw Dr. Graciela Husbands 10/2022 for routine follow-up.  Was having infrequent atrial fibrillation.  He was complaining of orthopnea and some DOE, had chronic edema but drinking a lot of fluids.  They discussed trialing multiple beta-blockers to see if alternative BB helped with fatigue.  Dr. Graciela Husbands also recommended increasing Lasix 120  160 daily x 5 days, consider torsemide in future At follow-up with Dr. Mariah Milling 02/2023, less fatigue on bisoprolol so this was continued.  On follow-up today, patient states that approximately 3 days ago he began having more SOB, dyspnea with less activity.  No changes in medication, exercise, other symptoms.  He denies lower extremity edema.  No chest pain, palpitations.  Has good appetite, able to sleep flat. He realized early this morning that he had inadvertently been taken bisoprolol twice daily instead of daily for the last few weeks, and questions whether this may be the cause of his recent symptoms.  He diligently takes Eliquis twice daily, no bleeding concerns.  Device Information: Medtronic dual chamber PPM, imp 2007 ([place]), dx CHB Gen change 10/2015 at [facility]  AAD History: Amiodarone   Past Medical History:  Diagnosis Date   Aortic atherosclerosis (HCC)    Aortic root dilation (HCC)    a.) TTE [id number]: Ao root measured 39 mm.   BPH (benign prostatic hyperplasia)    CHF (congestive heart failure) (HCC)    Coronary artery disease    a.) remote PCI of the RI in 1997; stent (unknown type) placed.   Depression    Diverticulosis     Eczema    GERD (gastroesophageal reflux disease)    Hyperlipidemia    Hypertension    Inguinal hernia, left    Long term current use of anticoagulant    a.) apixaban   Nephrolithiasis    NSVT (nonsustained ventricular tachycardia) (HCC)    Osteoarthrosis    Paroxysmal A-fib (HCC)    a.) CHA2DS2VASc = 5 (age x2, CHF, HTN, vascular disease history);  b.) rate/rhythm maintained on oral metoprolol succinate; chronically anticoagulated with apixaban   Pharyngeal dysphagia    Presence of permanent cardiac pacemaker 2007   a.) Medtronic device placed in [place] in 2007; b.) generator changed at [facility] on 11/18/2015   RBBB    SSS (sick sinus syndrome) (HCC) 2007   a.) s/p PPM placement in [place] in 2007    Past Surgical History:  Procedure Laterality Date   BLEPHAROPLASTY Bilateral    CARDIAC CATHETERIZATION  09/25/1995   CARDIAC CATHETERIZATION  09/24/1998   CHOLECYSTECTOMY     COLONOSCOPY  09/24/2010   CORONARY ANGIOPLASTY  09/25/1995   ramus   ESOPHAGOGASTRODUODENOSCOPY N/A 02/11/2017   Procedure: ESOPHAGOGASTRODUODENOSCOPY (EGD);  Surgeon: Christena Deem, MD;  Location: [facility] ENDOSCOPY;  Service: Endoscopy;  Laterality: N/A;   INGUINAL HERNIA REPAIR Left 1977   INSERT / REPLACE / REMOVE PACEMAKER  12/23/2005   Medtronic [id number] - placed in [place]   INSERTION OF MESH Left 06/08/2022   Procedure: INSERTION OF MESH;  Surgeon: Campbell Lerner, MD;  Location:  [facility] ORS;  Service: General;  Laterality: Left;   MANDIBLE SURGERY  1966   PACEMAKER GENERATOR CHANGE N/A 11/18/2015   ROBOTIC ASSISTED LAPAROSCOPIC CHOLECYSTECTOMY  01/12/2022   TONSILLECTOMY     TOTAL HIP ARTHROPLASTY Right 2022   TOTAL SHOULDER REPLACEMENT     bilateral approx 2020 and 2021    Current Outpatient Medications  Medication Instructions   acetaminophen (TYLENOL) 500 mg, Oral, Every 8 hours PRN   albuterol (VENTOLIN HFA) 108 (90 Base) MCG/ACT inhaler 2 puffs, Inhalation, Every 6 hours PRN    apixaban (ELIQUIS) 5 mg, Oral, 2 times daily   bisoprolol (ZEBETA) 5 mg, Oral, Daily   [facility] Zinc 50 mg, Oral, Daily   diclofenac (VOLTAREN) 75 mg, Oral, 2 times daily   finasteride (PROSCAR) 5 mg, Oral, Daily   fluticasone (FLONASE) 50 MCG/ACT nasal spray 2 sprays, Each Nare, 2 times daily   furosemide (LASIX) 120 mg, Oral, Daily, 3 tablets daily and may take 4 tablets if needed   lidocaine (LIDODERM) 5 % 1 patch, Transdermal, Every 24 hours, Remove & Discard patch within 12 hours or as directed by MD   lovastatin (MEVACOR) 20 mg, Oral, Daily at bedtime   nitroGLYCERIN (NITROSTAT) 0.4 mg, Sublingual, Every 5 min PRN   potassium chloride SA (KLOR-CON M) 20 MEQ tablet Take 2 tablets daily every other day. Take with lasix   sertraline (ZOLOFT) 100 mg, Oral, Daily   tamsulosin (FLOMAX) 0.8 mg, Oral, Daily after breakfast    Social History:  The patient  reports that he has never smoked. He has never used smokeless tobacco. He reports current alcohol use. He reports that he does not use drugs.   Family History: The patient's family history includes Heart attack in his father; Heart disease in his brother and mother; Hyperlipidemia in his mother; Hypertension in his mother.  ROS:  Please see the history of present illness. All other systems are reviewed and otherwise negative.   PHYSICAL EXAM:  VS:  BP 122/80 (BP Location: Left Arm, Patient Position: Sitting, Cuff Size: Normal)   Pulse 77   Ht 5\' 11"  (1.803 m)   Wt 171 lb (77.6 kg)   SpO2 98%   BMI 23.85 kg/m  BMI: Body mass index is 23.85 kg/m.  GEN- The patient is well appearing, alert and oriented x 3 today.   Lungs- Clear to ausculation bilaterally, normal work of breathing.  Heart- Regular rate and rhythm, no murmurs, rubs or gallops Extremities- No peripheral edema, warm, dry Skin-   device pocket well-healed, no tethering   Device interrogation done today and reviewed by myself:  Battery 2.5 years Lead thresholds, impedence,  sensing stable  Low AF burden - 0.8% Rare, brief NSVT episodes No changes made today  EKG is ordered. Personal review of EKG from today shows:   EKG Interpretation Date/Time:  Thursday April 11 2023 10:07:17 EDT Ventricular Rate:  77 PR Interval:  250 QRS Duration:  116 QT Interval:  422 QTC Calculation: 477 R Axis:   -7  Text Interpretation: Atrial-paced rhythm with prolonged AV conduction Right bundle branch block ST & T wave abnormality, consider anterior ischemia When compared with ECG of 22-Mar-2023 08:52, Nonspecific T wave abnormality, improved in Inferior leads Confirmed by Sherie Don [phone]) on 04/11/2023 10:13:31 AM    Recent Labs: 11/22/2022: ALT 9; BUN 18; Creatinine 1.0; Hemoglobin 14.2; Platelets 146; Sodium 140 12/10/2022: Potassium 3.8  No results found for requested labs within last 365 days.   CrCl cannot be  calculated (Patient's most recent lab result is older than the maximum 21 days allowed.).   Wt Readings from Last 3 Encounters:  04/11/23 171 lb (77.6 kg)  03/22/23 171 lb 2 oz (77.6 kg)  03/13/23 169 lb 6.4 oz (76.8 kg)     Additional studies reviewed include: Previous EP, cardiology notes.   TTE, 10/11/2022  1. Left ventricular ejection fraction, by estimation, is 50 to 55%. The left ventricle has low normal function. The left ventricle has no regional wall motion abnormalities. There is moderate to severe left ventricular hypertrophy mid ventricle extending distally, severe in the apical region with near cavity obliteration. Left ventricular diastolic parameters are indeterminate.   2. Right ventricular systolic function is normal. The right ventricular size is normal. There is mildly elevated pulmonary artery systolic pressure. The estimated right ventricular systolic pressure is 36.1 mmHg.   3. Left atrial size was severely dilated.   4. The mitral valve is normal in structure. Mild mitral valve regurgitation. No evidence of mitral stenosis.   5. The  aortic valve is tricuspid. Aortic valve regurgitation is not visualized. No aortic stenosis is present.   6. There is borderline dilatation of the ascending aorta, measuring 37 mm.   7. The inferior vena cava is normal in size with greater than 50% respiratory variability, suggesting right atrial pressure of 3 mmHg.   Comparison(s): 03/14/17-EF 50-55%.   ASSESSMENT AND PLAN:  #) SOB, fatigue Recent increase in shortness of breath and fatigue is likely related to inappropriately dosing bisoprolol twice daily, prescribed to take daily Recommended he reduce dosing to daily We will update lab work given other comorbidities and anticoagulation usage If symptoms persist with reduced bisoprolol dosing, will pursue echo for further evaluation   #) CHB s/p MDT PPM Device functioning well, see Paceart for details Rare VP  #) parox / SCAF Low burden by device at 0.8% No symptomatic burden Continue bisoprolol 5 mg daily Continue Eliquis 5 mg twice daily for CHA2DS2-VASc of 5 (CHF, HTN, Vasc, age x 2)  #) NSVT Rare, brief episodes by device No symptomatic burden  #) HFpEF NYHA III symptoms Warm and dry on exam without lower extremity edema     Current medicines are reviewed at length with the patient today.   The patient does not have concerns regarding his medicines.  The following changes were made today:  none  Labs/ tests ordered today include:  Orders Placed This Encounter  Procedures   CBC   Basic Metabolic Panel (BMET)   EKG 12-Lead     Disposition: Follow up with Dr. Graciela Husbands or EP APP in 12 months   Signed, Sherie Don, NP  04/11/23  10:59 AM  Electrophysiology [facility]

## 2023-04-11 NOTE — Telephone Encounter (Signed)
Patient's wife called and stated that patient has Labs scheduled at [facility] on Monday at 7:30am.   Stated that he just saw the Cardiologist today and labs were ordered an drawn and did NOT want anything duplicated. Wants to know if he still needed to come for labs at [facility] on Monday.    Please Advise.

## 2023-04-11 NOTE — Telephone Encounter (Signed)
Left voicemail message requesting for patient call back to verify whether or not he is taking the potassium supplement daily as prescribed. (See lab results)

## 2023-04-11 NOTE — Patient Instructions (Signed)
Medication Instructions:  REDUCE bisoprolol to 5 mg by mouth once daily.  *If you need a refill on your cardiac medications before your next appointment, please call your pharmacy*   Lab Work: CBC and BMP - Please go to the [facility]. You will check in at the front desk to the right as you walk into the atrium. Valet Parking is offered if needed. - No appointment needed. You may go any day between 7 am and 6 pm.  If you have labs (blood work) drawn today and your tests are completely normal, you will receive your results only by: MyChart Message (if you have MyChart) OR A paper copy in the mail If you have any lab test that is abnormal or we need to change your treatment, we will call you to review the results.   Testing/Procedures: No testing ordered   Follow-Up: At [facility], you and your health needs are our priority.  As part of our continuing mission to provide you with exceptional heart care, we have created designated Provider Care Teams.  These Care Teams include your primary Cardiologist (physician) and Advanced Practice Providers (APPs -  Physician Assistants and Nurse Practitioners) who all work together to provide you with the care you need, when you need it.  We recommend signing up for the patient portal called "MyChart".  Sign up information is provided on this After Visit Summary.  MyChart is used to connect with patients for Virtual Visits (Telemedicine).  Patients are able to view lab/test results, encounter notes, upcoming appointments, etc.  Non-urgent messages can be sent to your provider as well.   To learn more about what you can do with MyChart, go to [email or web address].    Your next appointment:   1 year(s)  Provider:   Sherryl Manges, MD or Sherie Don, NP   Other Instructions Please call our office on Monday to let Suzann know how you are doing. Thank you!

## 2023-04-12 ENCOUNTER — Other Ambulatory Visit: Payer: Self-pay

## 2023-04-12 DIAGNOSIS — I509 Heart failure, unspecified: Secondary | ICD-10-CM

## 2023-04-12 MED ORDER — POTASSIUM CHLORIDE CRYS ER 20 MEQ PO TBCR
40.0000 meq | EXTENDED_RELEASE_TABLET | Freq: Every day | ORAL | Status: DC
Start: 2023-04-12 — End: 2024-02-11

## 2023-04-12 NOTE — Telephone Encounter (Signed)
Kyle Mealing, MD  You14 hours ago (8:39 PM)   VB We were going to check his A1c as well as his Vitamin B12 levels. It looks like he needs his Potassium rechecked as well. I would like to have labs recollected, but we can cancel the CBC.  Thanks,  VB     Patient wife notified and CBC removed from lab order requisition

## 2023-04-15 ENCOUNTER — Telehealth: Payer: Self-pay | Admitting: Internal Medicine

## 2023-04-15 LAB — CBC WITH DIFFERENTIAL/PLATELET
Basophils Absolute: 32 cells/uL (ref 0–200)
Basophils Relative: 0.6 %
Eosinophils Relative: 6.3 %
HCT: 41 % (ref 38.5–50.0)
Hemoglobin: 13.8 g/dL (ref 13.2–17.1)
Lymphs Abs: 1312 cells/uL (ref 850–3900)
MCH: 32.7 pg (ref 27.0–33.0)
MPV: 9 fL (ref 7.5–12.5)
Monocytes Relative: 8.3 %
Neutro Abs: 3267 cells/uL (ref 1500–7800)
Platelets: 141 10*3/uL (ref 140–400)
RDW: 13.1 % (ref 11.0–15.0)
Total Lymphocyte: 24.3 %
WBC: 5.4 10*3/uL (ref 3.8–10.8)

## 2023-04-15 LAB — COMPLETE METABOLIC PANEL WITH GFR
AG Ratio: 2 (calc) (ref 1.0–2.5)
ALT: 9 U/L (ref 9–46)
AST: 12 U/L (ref 10–35)
BUN: 18 mg/dL (ref 7–25)
Globulin: 2.1 g/dL (calc) (ref 1.9–3.7)
Potassium: 3.8 mmol/L (ref 3.5–5.3)
Total Bilirubin: 0.6 mg/dL (ref 0.2–1.2)
eGFR: 77 mL/min/{1.73_m2} (ref >=60)

## 2023-04-15 LAB — VITAMIN B12: Vitamin B-12: 301 pg/mL (ref 200–1100)

## 2023-04-15 NOTE — Telephone Encounter (Signed)
Returned the call to the patient's wife, per the dpr. She stated that someone had already spoken to her.   She did state that the patient is much better and is noticeably less short of breath.

## 2023-04-15 NOTE — Telephone Encounter (Signed)
Patient's wife is calling due to being told by Patrick North, NP with med change to call today to give update. She states he has noticeably less shortness of breath.  Believes med change has helped.

## 2023-04-16 LAB — CBC WITH DIFFERENTIAL/PLATELET
Absolute Monocytes: 448 cells/uL (ref 200–950)
Eosinophils Absolute: 340 cells/uL (ref 15–500)
MCHC: 33.7 g/dL (ref 32.0–36.0)
MCV: 97.2 fL (ref 80.0–100.0)
Neutrophils Relative %: 60.5 %
RBC: 4.22 10*6/uL (ref 4.20–5.80)

## 2023-04-16 LAB — COMPLETE METABOLIC PANEL WITH GFR
Albumin: 4.2 g/dL (ref 3.6–5.1)
Alkaline phosphatase (APISO): 64 U/L (ref 35–144)
CO2: 27 mmol/L (ref 20–32)
Calcium: 9.1 mg/dL (ref 8.6–10.3)
Chloride: 107 mmol/L (ref 98–110)
Creat: 0.96 mg/dL (ref 0.70–1.22)
Glucose, Bld: 100 mg/dL — ABNORMAL HIGH (ref 65–99)
Sodium: 142 mmol/L (ref 135–146)
Total Protein: 6.3 g/dL (ref 6.1–8.1)

## 2023-04-16 LAB — HEMOGLOBIN A1C
Hgb A1c MFr Bld: 5.7 % of total Hgb — ABNORMAL HIGH (ref <5.7)
Mean Plasma Glucose: 117 mg/dL
eAG (mmol/L): 6.5 mmol/L

## 2023-04-18 NOTE — Progress Notes (Signed)
Subjective: Kyle Morales presents today referred by Earnestine Mealing, MD for complaint of painful elongated mycotic toenails 1-5 bilaterally which are tender when wearing enclosed shoe gear. Pain is relieved with periodic professional debridement.  Chief Complaint  Patient presents with   Nail Problem    RFC,Referring Provider Earnestine Mealing, MD,lov:06/24      Past Medical History:  Diagnosis Date   Aortic atherosclerosis (HCC)    Aortic root dilation (HCC)    a.) TTE [id number]: Ao root measured 39 mm.   BPH (benign prostatic hyperplasia)    CHF (congestive heart failure) (HCC)    Coronary artery disease    a.) remote PCI of the RI in 1997; stent (unknown type) placed.   Depression    Diverticulosis    Eczema    GERD (gastroesophageal reflux disease)    Hyperlipidemia    Hypertension    Inguinal hernia, left    Long term current use of anticoagulant    a.) apixaban   Nephrolithiasis    NSVT (nonsustained ventricular tachycardia) (HCC)    Osteoarthrosis    Paroxysmal A-fib (HCC)    a.) CHA2DS2VASc = 5 (age x2, CHF, HTN, vascular disease history);  b.) rate/rhythm maintained on oral metoprolol succinate; chronically anticoagulated with apixaban   Pharyngeal dysphagia    Presence of permanent cardiac pacemaker 2007   a.) Medtronic device placed in [place] in 2007; b.) generator changed at [facility] on 11/18/2015   RBBB    SSS (sick sinus syndrome) (HCC) 2007   a.) s/p PPM placement in [place] in 2007     Patient Active Problem List   Diagnosis Date Noted   NSVT (nonsustained ventricular tachycardia) (HCC) 04/11/2023   Hypercoagulable state due to paroxysmal atrial fibrillation (HCC) 04/11/2023   Aortic ectasia, unspecified site (HCC) 07/17/2022   Unilateral recurrent inguinal hernia without obstruction or gangrene    Nasal mass 06/04/2022   Status post laparoscopic cholecystectomy 02/01/2022   Acute lower UTI 01/11/2022   Hypokalemia 01/11/2022   Dyslipidemia  01/11/2022   BPH (benign prostatic hyperplasia) 01/11/2022   GERD without esophagitis 01/11/2022   Loculated pleural effusion 01/11/2022   Atypical chest pain 01/11/2022   Abnormal barium swallow 12/07/2021   At risk for aspiration 12/07/2021   Change in voice 12/07/2021   Bilateral recurrent inguinal hernia without obstruction or gangrene 09/26/2021   Current use of long term anticoagulation 09/26/2021   S/P hip replacement 07/25/2021   Left inguinal hernia 07/25/2021   Dysphagia, 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   Dysphagia 06/22/2016   Low back pain 06/22/2016   Tachy-brady syndrome (HCC) 10/22/2015   IBS (irritable bowel syndrome) 09/14/2015   Acute bronchitis 07/29/2015   Chronic diastolic CHF (congestive heart failure) (HCC) 01/28/2015   Dermatitis fungal 12/14/2014   Nephrolithiasis 12/01/2014   CHF (congestive heart failure) (HCC)    Coronary artery disease    Diarrhea 07/30/2014   Encounter for therapeutic drug monitoring 07/14/2014   Numbness and tingling in left hand 05/11/2014   Orthostatic hypotension 04/28/2014   Leg swelling 12/08/2013   Disequilibrium 06/02/2013   Atherosclerosis of native coronary artery of native heart with stable angina pectoris (HCC) 03/12/2013   SOB (shortness of breath) 03/12/2013   A-fib (HCC) 03/12/2013   Hyperlipidemia 03/12/2013   Cardiac pacemaker in situ 03/12/2013   Essential hypertension 03/12/2013   Depression 01/20/2013   Stasis dermatitis of both legs 01/20/2013   Myopia 09/05/2012   Presbyopia 09/05/2012   Tear film insufficiency 09/05/2012   Vitreous opacity 09/05/2012  Primary localized osteoarthrosis, lower leg 11/29/2010   History of nonmelanoma skin cancer 10/25/2010   Myalgia and myositis 12/04/2005     Past Surgical History:  Procedure Laterality Date   BLEPHAROPLASTY Bilateral    CARDIAC CATHETERIZATION  09/25/1995   CARDIAC CATHETERIZATION  09/24/1998   CHOLECYSTECTOMY     COLONOSCOPY  09/24/2010   CORONARY  ANGIOPLASTY  09/25/1995   ramus   ESOPHAGOGASTRODUODENOSCOPY N/A 02/11/2017   Procedure: ESOPHAGOGASTRODUODENOSCOPY (EGD);  Surgeon: Christena Deem, MD;  Location: [facility];  Service: Endoscopy;  Laterality: N/A;   INGUINAL HERNIA REPAIR Left 1977   INSERT / REPLACE / REMOVE PACEMAKER  12/23/2005   Medtronic [id number] - placed in [place]   INSERTION OF MESH Left 06/08/2022   Procedure: INSERTION OF MESH;  Surgeon: Campbell Lerner, MD;  Location: [facility] ORS;  Service: General;  Laterality: Left;   MANDIBLE SURGERY  1966   PACEMAKER GENERATOR CHANGE N/A 11/18/2015   ROBOTIC ASSISTED LAPAROSCOPIC CHOLECYSTECTOMY  01/12/2022   TONSILLECTOMY     TOTAL HIP ARTHROPLASTY Right 2022   TOTAL SHOULDER REPLACEMENT     bilateral approx 2020 and 2021     Current Outpatient Medications on File Prior to Visit  Medication Sig Dispense Refill   acetaminophen (TYLENOL) 500 MG tablet Take 500 mg by mouth every 8 (eight) hours as needed.     albuterol (VENTOLIN HFA) 108 (90 Base) MCG/ACT inhaler Inhale 2 puffs into the lungs every 6 (six) hours as needed for wheezing or shortness of breath. 8 g 3   apixaban (ELIQUIS) 5 MG TABS tablet Take 1 tablet (5 mg total) by mouth 2 (two) times daily. 180 tablet 3   bisoprolol (ZEBETA) 5 MG tablet Take 1 tablet (5 mg total) by mouth daily. 90 tablet 3   [facility]S ZINC 50 MG TABS Take 50 mg by mouth daily.     diclofenac (VOLTAREN) 75 MG EC tablet Take 1 tablet (75 mg total) by mouth 2 (two) times daily. 30 tablet 0   finasteride (PROSCAR) 5 MG tablet Take 1 tablet (5 mg total) by mouth daily. 90 tablet 3   fluticasone (FLONASE) 50 MCG/ACT nasal spray Place 2 sprays into both nostrils 2 (two) times daily. 18 mL 3   furosemide (LASIX) 40 MG tablet Take 3 tablets (120 mg total) by mouth daily. 3 tablets daily and may take 4 tablets if needed 270 tablet 3   lidocaine (LIDODERM) 5 % Place 1 patch onto the skin daily. Remove & Discard patch within 12 hours or as directed  by MD 30 patch 0   lovastatin (MEVACOR) 20 MG tablet TAKE 1 TABLET AT BEDTIME 90 tablet 3   nitroGLYCERIN (NITROSTAT) 0.4 MG SL tablet Place 1 tablet (0.4 mg total) under the tongue every 5 (five) minutes as needed for chest pain. 25 tablet 1   sertraline (ZOLOFT) 100 MG tablet Take 1 tablet (100 mg total) by mouth daily. 90 tablet 3   tamsulosin (FLOMAX) 0.4 MG CAPS capsule Take 2 capsules (0.8 mg total) by mouth daily after breakfast. 180 capsule 3   No current facility-administered medications on file prior to visit.     Allergies  Allergen Reactions   Amoxicillin Rash and Itching   Bactrim [Sulfamethoxazole-Trimethoprim] Rash     Social History   Occupational History   Not on file  Tobacco Use   Smoking status: Never   Smokeless tobacco: Never  Vaping Use   Vaping status: Never Used  Substance and Sexual Activity   Alcohol  use: Yes    Comment: 1 beer monthly, mixed drink 2-3 times yearly   Drug use: No   Sexual activity: Not on file     Family History  Problem Relation Age of Onset   Heart disease Mother    Hypertension Mother    Hyperlipidemia Mother    Heart attack Father    Heart disease Brother      Immunization History  Administered Date(s) Administered   Covid-19, Mrna,Vaccine(Spikevax)19yrs and older 08/03/2022, 01/01/2023   Fluad Quad(high Dose 65+) 07/25/2021, 07/17/2022   H1N1 09/02/2008   Influenza, Seasonal, Injecte, Preservative Fre 08/01/2006, 09/11/2007, 06/10/2008, 07/28/2009, 06/08/2010, 07/12/2011, 08/05/2012, 07/07/2013   Influenza,inj,Quad PF,6+ Mos 08/05/2012, 06/14/2015, 06/22/2016, 08/05/2017, 07/01/2018, 06/09/2019, 06/02/2020   Influenza-Unspecified 06/13/2014, 07/25/2021, 07/17/2022   Moderna Covid-19 Vaccine Bivalent Booster 68yrs & up 06/15/2021, 02/20/2022   Moderna Sars-Covid-2 Vaccination 11/06/2019, 12/04/2019, 08/09/2020, 02/09/2021   Pneumococcal Conjugate-13 06/14/2015   Pneumococcal Polysaccharide-23 08/01/2006, 08/05/2012    RSV,unspecified 07/28/2022   Respiratory Syncytial Virus Vaccine,Recomb Aduvanted(Arexvy) 07/28/2022   Tdap 03/01/2023   Zoster Recombinant(Shingrix) 08/07/2019, 10/09/2019     Objective: Vitals:   04/11/23 1354  BP: 116/76    DAYVEN LINSLEY is a pleasant 87 y.o. male WD, WN in NAD. AAO x 3.  Vascular Examination: Vascular status intact b/l with palpable DP pulses and diminished PT pulses b/l. Pedal hair sparse b/l. CFT immediate b/l. No edema. No pain with calf compression b/l. Skin temperature gradient WNL b/l. No cyanosis or clubbing noted b/l LE.  Neurological Examination: Sensation grossly intact b/l with 10 gram monofilament. Vibratory sensation intact b/l.   Dermatological Examination: Pedal skin with normal turgor, texture and tone b/l. Toenails 1-5 b/l thick, discolored, elongated with subungual debris and pain on dorsal palpation. Hyperkeratotic lesion(s) submet head 5 right foot.  No erythema, no edema, no drainage, no fluctuance.  Musculoskeletal Examination: Muscle strength 5/5 to b/l LE. Hammertoe deformity noted 1-5 b/l.  Radiographs: None  Last A1c:      Latest Ref Rng & Units 04/15/2023    8:00 AM 11/22/2022   12:00 AM  Hemoglobin A1C  Hemoglobin-A1c <5.7 % of total Hgb 5.7  5.7         This result is from an external source.    Assessment: 1. Pain due to onychomycosis of toenails of both feet   2. Callus   3. Acquired hammertoes of both feet   4. Peripheral vascular disease with claudication (HCC)     Plan: -Patient was evaluated and treated. All patient's and/or POA's questions/concerns answered on today's visit. -Patient to continue soft, supportive shoe gear daily. -Toenails 1-5 b/l were debrided in length and girth with sterile nail nippers and dremel without iatrogenic bleeding.  -Callus(es) submet head 5 right foot pared utilizing sterile scalpel blade without complication or incident. Total number debrided =1. -Patient/POA to call should there  be question/concern in the interim.  Return in about 9 weeks (around 06/13/2023).  Freddie Breech, DPM

## 2023-04-19 ENCOUNTER — Ambulatory Visit: Payer: Medicare Other | Admitting: Student

## 2023-04-19 ENCOUNTER — Encounter: Payer: Self-pay | Admitting: Student

## 2023-04-19 VITALS — BP 126/86 | HR 93 | Temp 97.5°F | Ht 71.0 in | Wt 170.0 lb

## 2023-04-19 DIAGNOSIS — C44310 Basal cell carcinoma of skin of unspecified parts of face: Secondary | ICD-10-CM

## 2023-04-19 DIAGNOSIS — G250 Essential tremor: Secondary | ICD-10-CM

## 2023-04-19 DIAGNOSIS — F321 Major depressive disorder, single episode, moderate: Secondary | ICD-10-CM | POA: Diagnosis not present

## 2023-04-19 DIAGNOSIS — J449 Chronic obstructive pulmonary disease, unspecified: Secondary | ICD-10-CM

## 2023-04-19 DIAGNOSIS — I509 Heart failure, unspecified: Secondary | ICD-10-CM

## 2023-04-19 NOTE — Patient Instructions (Signed)
Please take Gabapentin 100 mg nightly for 1 week, then increase to 200 mg nightly. If not improving after 2 weeks can take 300 mg nightly of gabapentin.

## 2023-04-19 NOTE — Progress Notes (Unsigned)
Location:  [facility] clinic [facility]   Provider: Dr. Earnestine Mealing  Code Status: Full Code Goals of Care:     04/19/2023    2:09 PM  Advanced Directives  Does Patient Have a Medical Advance Directive? Yes  Type of Advance Directive Living will  Does patient want to make changes to medical advance directive? No - Patient declined     Chief Complaint  Patient presents with   Medical Management of Chronic Issues    Medical Management of Chronic Issues. 6 week follow up. Mohs surgery yesterday at [facility]    Quality Metric Gaps    To discuss need for Covid    HPI: Patient is a 87 y.o. male seen today for medical management of chronic diseases.    Mohs surgery yesterday. He thinks he needs the bandage changed.   Neurology recommends gabapentin for his tremor.   Mood is improving - he is getting much better with the higher dose.  Past Medical History:  Diagnosis Date   Aortic atherosclerosis (HCC)    Aortic root dilation (HCC)    a.) TTE [id number]: Ao root measured 39 mm.   BPH (benign prostatic hyperplasia)    CHF (congestive heart failure) (HCC)    Coronary artery disease    a.) remote PCI of the RI in 1997; stent (unknown type) placed.   Depression    Diverticulosis    Eczema    GERD (gastroesophageal reflux disease)    Hyperlipidemia    Hypertension    Inguinal hernia, left    Long term current use of anticoagulant    a.) apixaban   Nephrolithiasis    NSVT (nonsustained ventricular tachycardia) (HCC)    Osteoarthrosis    Paroxysmal A-fib (HCC)    a.) CHA2DS2VASc = 5 (age x2, CHF, HTN, vascular disease history);  b.) rate/rhythm maintained on oral metoprolol succinate; chronically anticoagulated with apixaban   Pharyngeal dysphagia    Presence of permanent cardiac pacemaker 2007   a.) Medtronic device placed in [place] in 2007; b.) generator changed at [facility] on 11/18/2015   RBBB    SSS (sick sinus syndrome) (HCC) 2007   a.) s/p PPM placement in [place]  in 2007    Past Surgical History:  Procedure Laterality Date   BLEPHAROPLASTY Bilateral    CARDIAC CATHETERIZATION  09/25/1995   CARDIAC CATHETERIZATION  09/24/1998   CHOLECYSTECTOMY     COLONOSCOPY  09/24/2010   CORONARY ANGIOPLASTY  09/25/1995   ramus   ESOPHAGOGASTRODUODENOSCOPY N/A 02/11/2017   Procedure: ESOPHAGOGASTRODUODENOSCOPY (EGD);  Surgeon: Christena Deem, MD;  Location: [facility] ENDOSCOPY;  Service: Endoscopy;  Laterality: N/A;   INGUINAL HERNIA REPAIR Left 1977   INSERT / REPLACE / REMOVE PACEMAKER  12/23/2005   Medtronic [id number] - placed in [place]   INSERTION OF MESH Left 06/08/2022   Procedure: INSERTION OF MESH;  Surgeon: Campbell Lerner, MD;  Location: [facility] ORS;  Service: General;  Laterality: Left;   MANDIBLE SURGERY  1966   PACEMAKER GENERATOR CHANGE N/A 11/18/2015   ROBOTIC ASSISTED LAPAROSCOPIC CHOLECYSTECTOMY  01/12/2022   TONSILLECTOMY     TOTAL HIP ARTHROPLASTY Right 2022   TOTAL SHOULDER REPLACEMENT     bilateral approx 2020 and 2021    Allergies  Allergen Reactions   Amoxicillin Rash and Itching   Bactrim [Sulfamethoxazole-Trimethoprim] Rash    Outpatient Encounter Medications as of 04/19/2023  Medication Sig   acetaminophen (TYLENOL) 500 MG tablet Take 500 mg by mouth every 8 (eight) hours as needed.  albuterol (VENTOLIN HFA) 108 (90 Base) MCG/ACT inhaler Inhale 2 puffs into the lungs every 6 (six) hours as needed for wheezing or shortness of breath.   apixaban (ELIQUIS) 5 MG TABS tablet Take 1 tablet (5 mg total) by mouth 2 (two) times daily.   bisoprolol (ZEBETA) 5 MG tablet Take 1 tablet (5 mg total) by mouth daily.   [facility]S ZINC 50 MG TABS Take 50 mg by mouth daily.   diclofenac (VOLTAREN) 75 MG EC tablet Take 1 tablet (75 mg total) by mouth 2 (two) times daily.   finasteride (PROSCAR) 5 MG tablet Take 1 tablet (5 mg total) by mouth daily.   fluticasone (FLONASE) 50 MCG/ACT nasal spray Place 2 sprays into both nostrils 2 (two) times daily.    furosemide (LASIX) 40 MG tablet Take 3 tablets (120 mg total) by mouth daily. 3 tablets daily and may take 4 tablets if needed   lidocaine (LIDODERM) 5 % Place 1 patch onto the skin daily. Remove & Discard patch within 12 hours or as directed by MD   lovastatin (MEVACOR) 20 MG tablet TAKE 1 TABLET AT BEDTIME   nitroGLYCERIN (NITROSTAT) 0.4 MG SL tablet Place 1 tablet (0.4 mg total) under the tongue every 5 (five) minutes as needed for chest pain.   potassium chloride SA (KLOR-CON M) 20 MEQ tablet Take 2 tablets (40 mEq total) by mouth daily. Take with lasix   sertraline (ZOLOFT) 100 MG tablet Take 1 tablet (100 mg total) by mouth daily.   tamsulosin (FLOMAX) 0.4 MG CAPS capsule Take 2 capsules (0.8 mg total) by mouth daily after breakfast.   No facility-administered encounter medications on file as of 04/19/2023.    Review of Systems:  Review of Systems  Health Maintenance  Topic Date Due   COVID-19 Vaccine (9 - 2023-24 season) 02/26/2023   INFLUENZA VACCINE  04/25/2023   Medicare Annual Wellness (AWV)  12/20/2023   DTaP/Tdap/Td (2 - Td or Tdap) 02/28/2033   Pneumonia Vaccine 41+ Years old  Completed   Zoster Vaccines- Shingrix  Completed   HPV VACCINES  Aged Out    Physical Exam: Vitals:   04/19/23 1407  BP: 126/86  Pulse: 93  Temp: (!) 97.5 F (36.4 C)  SpO2: 94%  Weight: 170 lb (77.1 kg)  Height: 5\' 11"  (1.803 m)   Body mass index is 23.71 kg/m. Physical Exam Constitutional:      Appearance: Normal appearance.  Cardiovascular:     Rate and Rhythm: Normal rate and regular rhythm.  Pulmonary:     Effort: Pulmonary effort is normal.     Breath sounds: Normal breath sounds.  Abdominal:     General: Abdomen is flat.     Palpations: Abdomen is soft.  Skin:    General: Skin is warm and dry.  Neurological:     Mental Status: He is alert and oriented to person, place, and time.  Psychiatric:        Mood and Affect: Mood normal.     Labs reviewed: Basic Metabolic  Panel: Recent Labs    06/08/22 0903 11/22/22 0000 12/10/22 0000 04/11/23 1111 04/15/23 0800  NA 142 140  --  140 142  K 3.4* 3.3* 3.8 3.3* 3.8  CL 99 103  --  107 107  CO2  --  30*  --  26 27  GLUCOSE 98  --   --  114* 100*  BUN <3* 18  --  16 18  CREATININE 0.90 1.0  --  1.02  0.96  CALCIUM  --  9.3  --  8.8* 9.1   Liver Function Tests: Recent Labs    05/31/22 1144 11/22/22 0000 04/15/23 0800  AST 20 15 12   ALT 13 9* 9  ALKPHOS 59 71  --   BILITOT 1.4*  --  0.6  PROT 6.9  --  6.3  ALBUMIN 4.1 4.3  --    No results for input(s): "LIPASE", "AMYLASE" in the last 8760 hours. No results for input(s): "AMMONIA" in the last 8760 hours. CBC: Recent Labs    05/31/22 1144 06/08/22 0903 11/22/22 0000 04/11/23 1111 04/15/23 0800  WBC 5.6  --  5.6 5.4 5.4  NEUTROABS  --   --   --   --  3,267  HGB 14.2   < > 14.2 14.3 13.8  HCT 42.8   < > 42 44.0 41.0  MCV 98.2  --   --  98.7 97.2  PLT 156  --  146* 131* 141   < > = values in this interval not displayed.   Lipid Panel: No results for input(s): "CHOL", "HDL", "LDLCALC", "TRIG", "CHOLHDL", "LDLDIRECT" in the last 8760 hours. Lab Results  Component Value Date   HGBA1C 5.7 (H) 04/15/2023    Procedures since last visit: CUP PACEART INCLINIC DEVICE CHECK  Result Date: 04/11/2023 Pacemaker check in clinic. Normal device function. Thresholds, sensing, impedances consistent with previous measurements. Device programmed to maximize longevity. Rare AFib, very rare NSVT. Device programmed at appropriate safety margins. Histogram distribution appropriate for patient activity level. Device programmed to optimize intrinsic conduction. Estimated longevity _2.5 years__. Patient enrolled in remote follow-up. Patient education completed.   Assessment/Plan Basal cell carcinoma (BCC) of skin of face, unspecified part of face  Congestive heart failure, unspecified HF chronicity, unspecified heart failure type (HCC)  Chronic obstructive  pulmonary disease, unspecified COPD type (HCC)  Current moderate episode of major depressive disorder, unspecified whether recurrent (HCC)  Essential tremor Recent Mohs surgery for BCC. Removed outer bandage today. Inside bandage is clean, dry, intact. Tremor is impacting his quality of life. Discussed concern for somnolence with gabapentin, he will start with 100 mg then increase to 200 mg and see how he responds to nightly dosing before a trial of daytime dosing. Mood is improving on higher dose. Lungs are clear and tolerating inhalers well, followed by pulmonology. Appears euvolemic on exam.   Labs/tests ordered:  * No order type specified * Next appt:  Visit date not found

## 2023-04-30 ENCOUNTER — Ambulatory Visit (INDEPENDENT_AMBULATORY_CARE_PROVIDER_SITE_OTHER): Payer: Medicare Other

## 2023-04-30 DIAGNOSIS — I495 Sick sinus syndrome: Secondary | ICD-10-CM | POA: Diagnosis not present

## 2023-04-30 LAB — CUP PACEART REMOTE DEVICE CHECK
Battery Remaining Longevity: 26 mo
Battery Voltage: 2.94 V
Brady Statistic AP VP Percent: 0.81 %
Brady Statistic AP VS Percent: 98.41 %
Brady Statistic AS VP Percent: 0.02 %
Brady Statistic AS VS Percent: 0.76 %
Brady Statistic RA Percent Paced: 98.35 %
Brady Statistic RV Percent Paced: 0.95 %
Date Time Interrogation Session: 20240806151105
Implantable Lead Connection Status: 753985
Implantable Lead Connection Status: 753985
Implantable Lead Implant Date: 20070411
Implantable Lead Implant Date: 20070411
Implantable Lead Location: 753859
Implantable Lead Location: 753860
Implantable Lead Model: 5076
Implantable Lead Model: 5076
Implantable Pulse Generator Implant Date: 20170224
Lead Channel Impedance Value: 342 Ohm
Lead Channel Impedance Value: 342 Ohm
Lead Channel Impedance Value: 380 Ohm
Lead Channel Impedance Value: 456 Ohm
Lead Channel Pacing Threshold Amplitude: 0.75 V
Lead Channel Pacing Threshold Amplitude: 1.125 V
Lead Channel Pacing Threshold Pulse Width: 0.4 ms
Lead Channel Pacing Threshold Pulse Width: 0.4 ms
Lead Channel Sensing Intrinsic Amplitude: 1.75 mV
Lead Channel Sensing Intrinsic Amplitude: 1.75 mV
Lead Channel Sensing Intrinsic Amplitude: 6 mV
Lead Channel Sensing Intrinsic Amplitude: 6 mV
Lead Channel Setting Pacing Amplitude: 2 V
Lead Channel Setting Pacing Amplitude: 2.5 V
Lead Channel Setting Pacing Pulse Width: 0.4 ms
Lead Channel Setting Sensing Sensitivity: 0.9 mV
Zone Setting Status: 755011
Zone Setting Status: 755011

## 2023-05-06 ENCOUNTER — Encounter: Payer: Self-pay | Admitting: Student

## 2023-05-06 ENCOUNTER — Ambulatory Visit: Payer: Medicare Other | Admitting: Student

## 2023-05-06 VITALS — BP 100/70 | HR 88 | Temp 98.0°F | Resp 20

## 2023-05-06 DIAGNOSIS — K769 Liver disease, unspecified: Secondary | ICD-10-CM | POA: Diagnosis not present

## 2023-05-06 DIAGNOSIS — M25572 Pain in left ankle and joints of left foot: Secondary | ICD-10-CM

## 2023-05-06 NOTE — Progress Notes (Signed)
Location:  [facility]   Place of Service:   [facility]  Provider:   Code Status: Full Code Goals of Care:     04/19/2023    2:09 PM  Advanced Directives  Does Patient Have a Medical Advance Directive? Yes  Type of Advance Directive Living will  Does patient want to make changes to medical advance directive? No - Patient declined     No chief complaint on file.   HPI: Patient is a 87 y.o. male seen today for an acute visit for wound check.   He was taking the trash out and ran in to a cobweb. He fell on his way in. This occurred on Thursday morning. He was concerned about it. No drainage.   He had an imaging study with urologist RUS due to hx of kidney stones. Notable finding on liver.   He gets tired and short winded and last night he did as well. This has been happening more often.   He has started the gabapentin - 200 mg nightly. He hasn't had a significant change in his tremor at this time. Has upcoming follow up with Neurology. Discourged increasing medication more, can decrease back to 100 if he is too tired.   His balance has been impacted by the gabapentin.   Past Medical History:  Diagnosis Date   Aortic atherosclerosis (HCC)    Aortic root dilation (HCC)    a.) TTE [id number]: Ao root measured 39 mm.   BPH (benign prostatic hyperplasia)    CHF (congestive heart failure) (HCC)    Coronary artery disease    a.) remote PCI of the RI in 1997; stent (unknown type) placed.   Depression    Diverticulosis    Eczema    GERD (gastroesophageal reflux disease)    Hyperlipidemia    Hypertension    Inguinal hernia, left    Long term current use of anticoagulant    a.) apixaban   Nephrolithiasis    NSVT (nonsustained ventricular tachycardia) (HCC)    Osteoarthrosis    Paroxysmal A-fib (HCC)    a.) CHA2DS2VASc = 5 (age x2, CHF, HTN, vascular disease history);  b.) rate/rhythm maintained on oral metoprolol succinate; chronically anticoagulated with apixaban    Pharyngeal dysphagia    Presence of permanent cardiac pacemaker 2007   a.) Medtronic device placed in [place] in 2007; b.) generator changed at [facility] on 11/18/2015   RBBB    SSS (sick sinus syndrome) (HCC) 2007   a.) s/p PPM placement in [place] in 2007    Past Surgical History:  Procedure Laterality Date   BLEPHAROPLASTY Bilateral    CARDIAC CATHETERIZATION  09/25/1995   CARDIAC CATHETERIZATION  09/24/1998   CHOLECYSTECTOMY     COLONOSCOPY  09/24/2010   CORONARY ANGIOPLASTY  09/25/1995   ramus   ESOPHAGOGASTRODUODENOSCOPY N/A 02/11/2017   Procedure: ESOPHAGOGASTRODUODENOSCOPY (EGD);  Surgeon: Christena Deem, MD;  Location: [facility] ENDOSCOPY;  Service: Endoscopy;  Laterality: N/A;   INGUINAL HERNIA REPAIR Left 1977   INSERT / REPLACE / REMOVE PACEMAKER  12/23/2005   Medtronic [id number] - placed in [place]   INSERTION OF MESH Left 06/08/2022   Procedure: INSERTION OF MESH;  Surgeon: Campbell Lerner, MD;  Location: [facility] ORS;  Service: General;  Laterality: Left;   MANDIBLE SURGERY  1966   PACEMAKER GENERATOR CHANGE N/A 11/18/2015   ROBOTIC ASSISTED LAPAROSCOPIC CHOLECYSTECTOMY  01/12/2022   TONSILLECTOMY     TOTAL HIP ARTHROPLASTY Right 2022   TOTAL SHOULDER REPLACEMENT  bilateral approx 2020 and 2021    Allergies  Allergen Reactions   Amoxicillin Rash and Itching   Bactrim [Sulfamethoxazole-Trimethoprim] Rash    Outpatient Encounter Medications as of 05/06/2023  Medication Sig   acetaminophen (TYLENOL) 500 MG tablet Take 500 mg by mouth every 8 (eight) hours as needed.   albuterol (VENTOLIN HFA) 108 (90 Base) MCG/ACT inhaler Inhale 2 puffs into the lungs every 6 (six) hours as needed for wheezing or shortness of breath.   apixaban (ELIQUIS) 5 MG TABS tablet Take 1 tablet (5 mg total) by mouth 2 (two) times daily.   bisoprolol (ZEBETA) 5 MG tablet Take 1 tablet (5 mg total) by mouth daily.   [employer] ZINC 50 MG TABS Take 50 mg by mouth daily.   diclofenac (VOLTAREN) 75  MG EC tablet Take 1 tablet (75 mg total) by mouth 2 (two) times daily.   finasteride (PROSCAR) 5 MG tablet Take 1 tablet (5 mg total) by mouth daily.   fluticasone (FLONASE) 50 MCG/ACT nasal spray Place 2 sprays into both nostrils 2 (two) times daily.   furosemide (LASIX) 40 MG tablet Take 3 tablets (120 mg total) by mouth daily. 3 tablets daily and may take 4 tablets if needed   lidocaine (LIDODERM) 5 % Place 1 patch onto the skin daily. Remove & Discard patch within 12 hours or as directed by MD   lovastatin (MEVACOR) 20 MG tablet TAKE 1 TABLET AT BEDTIME   nitroGLYCERIN (NITROSTAT) 0.4 MG SL tablet Place 1 tablet (0.4 mg total) under the tongue every 5 (five) minutes as needed for chest pain.   potassium chloride SA (KLOR-CON M) 20 MEQ tablet Take 2 tablets (40 mEq total) by mouth daily. Take with lasix   sertraline (ZOLOFT) 100 MG tablet Take 1 tablet (100 mg total) by mouth daily.   tamsulosin (FLOMAX) 0.4 MG CAPS capsule Take 2 capsules (0.8 mg total) by mouth daily after breakfast.   No facility-administered encounter medications on file as of 05/06/2023.    Review of Systems:  Review of Systems  Health Maintenance  Topic Date Due   COVID-19 Vaccine (9 - 2023-24 season) 02/26/2023   INFLUENZA VACCINE  04/25/2023   Medicare Annual Wellness (AWV)  12/20/2023   DTaP/Tdap/Td (2 - Td or Tdap) 02/28/2033   Pneumonia Vaccine 39+ Years old  Completed   Zoster Vaccines- Shingrix  Completed   HPV VACCINES  Aged Out    Physical Exam: There were no vitals filed for this visit. There is no height or weight on file to calculate BMI. Physical Exam Constitutional:      Appearance: He is normal weight.  Cardiovascular:     Rate and Rhythm: Normal rate.     Pulses: Normal pulses.  Pulmonary:     Comments: Increased respiratory effort after ambulation, clear to auscultation bilaterally.  Abdominal:     Palpations: Abdomen is soft.  Musculoskeletal:     Comments: Supraclavicular muscle  wasting  Left ankle with significant brusing and swelling. Left shin with swollen soft tissue and hemostatic wound.   Neurological:     Mental Status: He is alert and oriented to person, place, and time.     Labs reviewed: Basic Metabolic Panel: Recent Labs    06/08/22 0903 11/22/22 0000 12/10/22 0000 04/11/23 1111 04/15/23 0800  NA 142 140  --  140 142  K 3.4* 3.3* 3.8 3.3* 3.8  CL 99 103  --  107 107  CO2  --  30*  --  26 27  GLUCOSE 98  --   --  114* 100*  BUN <3* 18  --  16 18  CREATININE 0.90 1.0  --  1.02 0.96  CALCIUM  --  9.3  --  8.8* 9.1   Liver Function Tests: Recent Labs    05/31/22 1144 11/22/22 0000 04/15/23 0800  AST 20 15 12   ALT 13 9* 9  ALKPHOS 59 71  --   BILITOT 1.4*  --  0.6  PROT 6.9  --  6.3  ALBUMIN 4.1 4.3  --    No results for input(s): "LIPASE", "AMYLASE" in the last 8760 hours. No results for input(s): "AMMONIA" in the last 8760 hours. CBC: Recent Labs    05/31/22 1144 06/08/22 0903 11/22/22 0000 04/11/23 1111 04/15/23 0800  WBC 5.6  --  5.6 5.4 5.4  NEUTROABS  --   --   --   --  3,267  HGB 14.2   < > 14.2 14.3 13.8  HCT 42.8   < > 42 44.0 41.0  MCV 98.2  --   --  98.7 97.2  PLT 156  --  146* 131* 141   < > = values in this interval not displayed.   Lipid Panel: No results for input(s): "CHOL", "HDL", "LDLCALC", "TRIG", "CHOLHDL", "LDLDIRECT" in the last 8760 hours. Lab Results  Component Value Date   HGBA1C 5.7 (H) 04/15/2023    Procedures since last visit: CUP PACEART REMOTE DEVICE CHECK  Result Date: 04/30/2023 Scheduled remote reviewed. Normal device function.  There were 2 short NSVT arrhythmias detected Next remote 91 days. Hassell Halim, RN, CCDS, [employer]  CUP PACEART INCLINIC DEVICE CHECK  Result Date: 04/11/2023 Pacemaker check in clinic. Normal device function. Thresholds, sensing, impedances consistent with previous measurements. Device programmed to maximize longevity. Rare AFib, very rare NSVT.  Device programmed at appropriate safety margins. Histogram distribution appropriate for patient activity level. Device programmed to optimize intrinsic conduction. Estimated longevity _2.5 years__. Patient enrolled in remote follow-up. Patient education completed.   Assessment/Plan Acute left ankle pain - Plan: DG Ankle Complete Left  Hepatic lesion - Plan: CT ABDOMEN PELVIS W WO CONTRAST Patient had a fall and has a small skin tear with soft tissue swelling as well as ankle swelling and bruising. Limited ROM, tenderness and 2+ swelling to mid shin. Will order x-ray of left ankle. 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 CT w/ w/o contrast of abdomen pelvis ordered. Recommend RICE for ankle. F/u imaging.  There are other unrelated non-urgent complaints, but due to the busy schedule and the amount of time I've already spent with him, time does not permit me to address these routine issues at today's visit. I've requested another appointment to review these additional issues.  Labs/tests ordered:  * No order type specified * Next appt:  05/22/2023   I spent greater than 40 minutes for the care of this patient in face to face time, chart review, clinical documentation, patient education.

## 2023-05-08 ENCOUNTER — Ambulatory Visit
Admission: RE | Admit: 2023-05-08 | Discharge: 2023-05-08 | Disposition: A | Discharge status: Home / Self Care | Payer: Medicare Other | Source: Ambulatory Visit | Attending: Student | Admitting: Student

## 2023-05-08 DIAGNOSIS — M25572 Pain in left ankle and joints of left foot: Secondary | ICD-10-CM | POA: Insufficient documentation

## 2023-05-09 ENCOUNTER — Encounter: Payer: Self-pay | Admitting: Nurse Practitioner

## 2023-05-09 ENCOUNTER — Ambulatory Visit (SKILLED_NURSING_FACILITY): Payer: Medicare Other | Admitting: Nurse Practitioner

## 2023-05-09 VITALS — BP 122/78 | HR 83 | Temp 98.0°F | Ht 71.0 in | Wt 175.0 lb

## 2023-05-09 DIAGNOSIS — L03116 Cellulitis of left lower limb: Secondary | ICD-10-CM

## 2023-05-09 MED ORDER — DOXYCYCLINE HYCLATE 100 MG PO TABS
100.0000 mg | ORAL_TABLET | Freq: Two times a day (BID) | ORAL | 0 refills | Status: DC
Start: 2023-05-09 — End: 2023-05-15

## 2023-05-09 NOTE — Progress Notes (Signed)
Careteam: Patient Care Team: Earnestine Mealing, MD as PCP - General (Family Medicine) Antonieta Iba, MD as PCP - Cardiology (Cardiology) Duke Salvia, MD as PCP - Electrophysiology (Cardiology) Antonieta Iba, MD as Consulting Physician (Cardiology) Christene Lye, MD as Referring Physician (Otolaryngology) Sondra Come, MD as Consulting Physician (Urology) Abbie Sons, MD as Referring Physician (Dermatology)  Advanced Directive information Does Patient Have a Medical Advance Directive?: Yes, Type of Advance Directive: Living will, Does patient want to make changes to medical advance directive?: No - Patient declined  Allergies  Allergen Reactions   Amoxicillin Rash and Itching   Bactrim [Sulfamethoxazole-Trimethoprim] Rash    Chief Complaint  Patient presents with   Acute Visit    Left lower leg and ankle swelling, redness and tenderness related to a recent fall.      HPI: Patient is a 87 y.o. male seen in today at the [facility] for ankle and leg tenderness and redness.  He had a fall 1 week ago. Saw Dr Sydnee Cabal 4 days ago due to swelling and pain in ankle but since he has had redness going up leg that is very tender and swelling is worse.  No fever noted.    Review of Systems:  Review of Systems  Constitutional:  Negative for chills and fever.  Respiratory:  Positive for shortness of breath (chronic and stable).   Cardiovascular:  Negative for chest pain.  Musculoskeletal:  Positive for falls.  Skin:        Redness to left lower leg     Past Medical History:  Diagnosis Date   Aortic atherosclerosis (HCC)    Aortic root dilation (HCC)    a.) TTE [id number]: Ao root measured 39 mm.   BPH (benign prostatic hyperplasia)    CHF (congestive heart failure) (HCC)    Coronary artery disease    a.) remote PCI of the RI in 1997; stent (unknown type) placed.   Depression    Diverticulosis    Eczema    GERD (gastroesophageal reflux disease)     Hyperlipidemia    Hypertension    Inguinal hernia, left    Long term current use of anticoagulant    a.) apixaban   Nephrolithiasis    NSVT (nonsustained ventricular tachycardia) (HCC)    Osteoarthrosis    Paroxysmal A-fib (HCC)    a.) CHA2DS2VASc = 5 (age x2, CHF, HTN, vascular disease history);  b.) rate/rhythm maintained on oral metoprolol succinate; chronically anticoagulated with apixaban   Pharyngeal dysphagia    Presence of permanent cardiac pacemaker 2007   a.) Medtronic device placed in [place] in 2007; b.) generator changed at [facility] on 11/18/2015   RBBB    SSS (sick sinus syndrome) (HCC) 2007   a.) s/p PPM placement in [place] in 2007   Past Surgical History:  Procedure Laterality Date   BLEPHAROPLASTY Bilateral    CARDIAC CATHETERIZATION  09/25/1995   CARDIAC CATHETERIZATION  09/24/1998   CHOLECYSTECTOMY     COLONOSCOPY  09/24/2010   CORONARY ANGIOPLASTY  09/25/1995   ramus   ESOPHAGOGASTRODUODENOSCOPY N/A 02/11/2017   Procedure: ESOPHAGOGASTRODUODENOSCOPY (EGD);  Surgeon: Christena Deem, MD;  Location: [facility] ENDOSCOPY;  Service: Endoscopy;  Laterality: N/A;   INGUINAL HERNIA REPAIR Left 1977   INSERT / REPLACE / REMOVE PACEMAKER  12/23/2005   Medtronic [id number] - placed in [place]   INSERTION OF MESH Left 06/08/2022   Procedure: INSERTION OF MESH;  Surgeon: Campbell Lerner, MD;  Location: [facility]  ORS;  Service: General;  Laterality: Left;   MANDIBLE SURGERY  1966   PACEMAKER GENERATOR CHANGE N/A 11/18/2015   ROBOTIC ASSISTED LAPAROSCOPIC CHOLECYSTECTOMY  01/12/2022   TONSILLECTOMY     TOTAL HIP ARTHROPLASTY Right 2022   TOTAL SHOULDER REPLACEMENT     bilateral approx 2020 and 2021   Social History:   reports that he has never smoked. He has never used smokeless tobacco. He reports current alcohol use. He reports that he does not use drugs.  Family History  Problem Relation Age of Onset   Heart disease Mother    Hypertension Mother    Hyperlipidemia  Mother    Heart attack Father    Heart disease Brother     Medications: Patient's Medications  New Prescriptions   No medications on file  Previous Medications   ACETAMINOPHEN (TYLENOL) 500 MG TABLET    Take 500 mg by mouth every 8 (eight) hours as needed.   ALBUTEROL (VENTOLIN HFA) 108 (90 BASE) MCG/ACT INHALER    Inhale 2 puffs into the lungs every 6 (six) hours as needed for wheezing or shortness of breath.   APIXABAN (ELIQUIS) 5 MG TABS TABLET    Take 1 tablet (5 mg total) by mouth 2 (two) times daily.   BISOPROLOL (ZEBETA) 5 MG TABLET    Take 1 tablet (5 mg total) by mouth daily.   [employer]S ZINC 50 MG TABS    Take 50 mg by mouth daily.   DICLOFENAC (VOLTAREN) 75 MG EC TABLET    Take 1 tablet (75 mg total) by mouth 2 (two) times daily.   FINASTERIDE (PROSCAR) 5 MG TABLET    Take 1 tablet (5 mg total) by mouth daily.   FLUTICASONE (FLONASE) 50 MCG/ACT NASAL SPRAY    Place 2 sprays into both nostrils 2 (two) times daily.   FUROSEMIDE (LASIX) 40 MG TABLET    Take 3 tablets (120 mg total) by mouth daily. 3 tablets daily and may take 4 tablets if needed   LIDOCAINE (LIDODERM) 5 %    Place 1 patch onto the skin daily. Remove & Discard patch within 12 hours or as directed by MD   LOVASTATIN (MEVACOR) 20 MG TABLET    TAKE 1 TABLET AT BEDTIME   NITROGLYCERIN (NITROSTAT) 0.4 MG SL TABLET    Place 1 tablet (0.4 mg total) under the tongue every 5 (five) minutes as needed for chest pain.   POTASSIUM CHLORIDE SA (KLOR-CON M) 20 MEQ TABLET    Take 2 tablets (40 mEq total) by mouth daily. Take with lasix   SERTRALINE (ZOLOFT) 100 MG TABLET    Take 1 tablet (100 mg total) by mouth daily.   TAMSULOSIN (FLOMAX) 0.4 MG CAPS CAPSULE    Take 2 capsules (0.8 mg total) by mouth daily after breakfast.  Modified Medications   No medications on file  Discontinued Medications   GABAPENTIN (NEURONTIN) 100 MG CAPSULE    Take 100 mg twice daily for one week, then increase to 200 mg twice    Physical Exam:  Vitals:    05/09/23 0850  BP: 122/78  Pulse: 83  Temp: 98 F (36.7 C)  TempSrc: Temporal  SpO2: 94%  Weight: 175 lb (79.4 kg)  Height: 5\' 11"  (1.803 m)   Body mass index is 24.41 kg/m. Wt Readings from Last 3 Encounters:  05/09/23 175 lb (79.4 kg)  04/19/23 170 lb (77.1 kg)  04/11/23 171 lb (77.6 kg)    Physical Exam Constitutional:  Appearance: Normal appearance.  Cardiovascular:     Rate and Rhythm: Normal rate.  Pulmonary:     Effort: Pulmonary effort is normal.     Breath sounds: Normal breath sounds.  Skin:    General: Skin is warm.     Findings: Erythema present.     Comments: 1 cm open area, no drainage  Neurological:     Mental Status: He is alert. Mental status is at baseline.  Psychiatric:        Mood and Affect: Mood normal.     Labs reviewed: Basic Metabolic Panel: Recent Labs    06/08/22 0903 11/22/22 0000 12/10/22 0000 04/11/23 1111 04/15/23 0800  NA 142 140  --  140 142  K 3.4* 3.3* 3.8 3.3* 3.8  CL 99 103  --  107 107  CO2  --  30*  --  26 27  GLUCOSE 98  --   --  114* 100*  BUN <3* 18  --  16 18  CREATININE 0.90 1.0  --  1.02 0.96  CALCIUM  --  9.3  --  8.8* 9.1   Liver Function Tests: Recent Labs    05/31/22 1144 11/22/22 0000 04/15/23 0800  AST 20 15 12   ALT 13 9* 9  ALKPHOS 59 71  --   BILITOT 1.4*  --  0.6  PROT 6.9  --  6.3  ALBUMIN 4.1 4.3  --    No results for input(s): "LIPASE", "AMYLASE" in the last 8760 hours. No results for input(s): "AMMONIA" in the last 8760 hours. CBC: Recent Labs    05/31/22 1144 06/08/22 0903 11/22/22 0000 04/11/23 1111 04/15/23 0800  WBC 5.6  --  5.6 5.4 5.4  NEUTROABS  --   --   --   --  3,267  HGB 14.2   < > 14.2 14.3 13.8  HCT 42.8   < > 42 44.0 41.0  MCV 98.2  --   --  98.7 97.2  PLT 156  --  146* 131* 141   < > = values in this interval not displayed.   Lipid Panel: No results for input(s): "CHOL", "HDL", "LDLCALC", "TRIG", "CHOLHDL", "LDLDIRECT" in the last 8760 hours. TSH: No  results for input(s): "TSH" in the last 8760 hours. A1C: Lab Results  Component Value Date   HGBA1C 5.7 (H) 04/15/2023     Assessment/Plan 1. Cellulitis of left lower extremity - doxycycline (VIBRA-TABS) 100 MG tablet; Take 1 tablet (100 mg total) by mouth 2 (two) times daily.  Dispense: 14 tablet; Refill: 0 -will follow up with [place] nurse in 3 days to ensure improving Will notify if symptoms worsen.    Janene Harvey. Biagio Borg  [facility] [phone]

## 2023-05-13 DIAGNOSIS — K029 Dental caries, unspecified: Secondary | ICD-10-CM | POA: Insufficient documentation

## 2023-05-14 ENCOUNTER — Ambulatory Visit
Admission: RE | Admit: 2023-05-14 | Discharge: 2023-05-14 | Disposition: A | Discharge status: Home / Self Care | Payer: Medicare Other | Source: Ambulatory Visit | Attending: Student | Admitting: Student

## 2023-05-14 DIAGNOSIS — K769 Liver disease, unspecified: Secondary | ICD-10-CM | POA: Insufficient documentation

## 2023-05-14 MED ORDER — IOHEXOL 300 MG/ML  SOLN
100.0000 mL | Freq: Once | INTRAMUSCULAR | Status: AC | PRN
  Administered 2023-05-14: 100 mL via INTRAVENOUS

## 2023-05-15 ENCOUNTER — Encounter: Payer: Self-pay | Admitting: Student

## 2023-05-15 ENCOUNTER — Ambulatory Visit: Payer: Medicare Other | Admitting: Student

## 2023-05-15 VITALS — BP 118/72 | HR 87 | Temp 97.9°F | Ht 71.0 in | Wt 178.0 lb

## 2023-05-15 DIAGNOSIS — L03116 Cellulitis of left lower limb: Secondary | ICD-10-CM | POA: Diagnosis not present

## 2023-05-15 DIAGNOSIS — S8012XA Contusion of left lower leg, initial encounter: Secondary | ICD-10-CM

## 2023-05-15 MED ORDER — DOXYCYCLINE HYCLATE 100 MG PO TABS
100.0000 mg | ORAL_TABLET | Freq: Two times a day (BID) | ORAL | 0 refills | Status: AC
Start: 2023-05-15 — End: 2023-05-22

## 2023-05-15 NOTE — Patient Instructions (Addendum)
Please continue to take antibiotics I'll see you next week.

## 2023-05-15 NOTE — Progress Notes (Unsigned)
[facility]   Provider: Dr. Earnestine Mealing  Code Status: Full Code Goals of Care:     05/15/2023    2:13 PM  Advanced Directives  Does Patient Have a Medical Advance Directive? Yes  Type of Advance Directive Living will  Does patient want to make changes to medical advance directive? No - Patient declined     Chief Complaint  Patient presents with   Acute Visit    Pain in Left Lower Leg    HPI: Patient is a 87 y.o. male seen today for an acute visit for Pain in Left Lower Leg.   Patient completed 7 days of doxycycline with minimal improvement. Swelling worsened over the course of the week.   Past Medical History:  Diagnosis Date   Aortic atherosclerosis (HCC)    Aortic root dilation (HCC)    a.) TTE [id number]: Ao root measured 39 mm.   BPH (benign prostatic hyperplasia)    CHF (congestive heart failure) (HCC)    Coronary artery disease    a.) remote PCI of the RI in 1997; stent (unknown type) placed.   Depression    Diverticulosis    Eczema    GERD (gastroesophageal reflux disease)    Hyperlipidemia    Hypertension    Inguinal hernia, left    Long term current use of anticoagulant    a.) apixaban   Nephrolithiasis    NSVT (nonsustained ventricular tachycardia) (HCC)    Osteoarthrosis    Paroxysmal A-fib (HCC)    a.) CHA2DS2VASc = 5 (age x2, CHF, HTN, vascular disease history);  b.) rate/rhythm maintained on oral metoprolol succinate; chronically anticoagulated with apixaban   Pharyngeal dysphagia    Presence of permanent cardiac pacemaker 2007   a.) Medtronic device placed in [place] in 2007; b.) generator changed at [facility] on 11/18/2015   RBBB    SSS (sick sinus syndrome) (HCC) 2007   a.) s/p PPM placement in [place] in 2007    Past Surgical History:  Procedure Laterality Date   BLEPHAROPLASTY Bilateral    CARDIAC CATHETERIZATION  09/25/1995   CARDIAC CATHETERIZATION  09/24/1998   CHOLECYSTECTOMY     COLONOSCOPY  09/24/2010   CORONARY  ANGIOPLASTY  09/25/1995   ramus   ESOPHAGOGASTRODUODENOSCOPY N/A 02/11/2017   Procedure: ESOPHAGOGASTRODUODENOSCOPY (EGD);  Surgeon: Christena Deem, MD;  Location: [facility] ENDOSCOPY;  Service: Endoscopy;  Laterality: N/A;   INGUINAL HERNIA REPAIR Left 1977   INSERT / REPLACE / REMOVE PACEMAKER  12/23/2005   Medtronic [id number] - placed in [place]   INSERTION OF MESH Left 06/08/2022   Procedure: INSERTION OF MESH;  Surgeon: Campbell Lerner, MD;  Location: [facility] ORS;  Service: General;  Laterality: Left;   MANDIBLE SURGERY  1966   PACEMAKER GENERATOR CHANGE N/A 11/18/2015   ROBOTIC ASSISTED LAPAROSCOPIC CHOLECYSTECTOMY  01/12/2022   TONSILLECTOMY     TOTAL HIP ARTHROPLASTY Right 2022   TOTAL SHOULDER REPLACEMENT     bilateral approx 2020 and 2021    Allergies  Allergen Reactions   Amoxicillin Rash and Itching   Bactrim [Sulfamethoxazole-Trimethoprim] Rash    Outpatient Encounter Medications as of 05/15/2023  Medication Sig   acetaminophen (TYLENOL) 500 MG tablet Take 500 mg by mouth every 8 (eight) hours as needed.   albuterol (VENTOLIN HFA) 108 (90 Base) MCG/ACT inhaler Inhale 2 puffs into the lungs every 6 (six) hours as needed for wheezing or shortness of breath.   apixaban (ELIQUIS) 5 MG TABS tablet Take 1 tablet (5 mg  total) by mouth 2 (two) times daily.   bisoprolol (ZEBETA) 5 MG tablet Take 1 tablet (5 mg total) by mouth daily.   [facility] ZINC 50 MG TABS Take 50 mg by mouth daily.   diclofenac (VOLTAREN) 75 MG EC tablet Take 1 tablet (75 mg total) by mouth 2 (two) times daily.   doxycycline (VIBRA-TABS) 100 MG tablet Take 1 tablet (100 mg total) by mouth 2 (two) times daily.   finasteride (PROSCAR) 5 MG tablet Take 1 tablet (5 mg total) by mouth daily.   fluticasone (FLONASE) 50 MCG/ACT nasal spray Place 2 sprays into both nostrils 2 (two) times daily.   furosemide (LASIX) 40 MG tablet Take 3 tablets (120 mg total) by mouth daily. 3 tablets daily and may take 4 tablets if needed    lidocaine (LIDODERM) 5 % Place 1 patch onto the skin daily. Remove & Discard patch within 12 hours or as directed by MD   lovastatin (MEVACOR) 20 MG tablet TAKE 1 TABLET AT BEDTIME   nitroGLYCERIN (NITROSTAT) 0.4 MG SL tablet Place 1 tablet (0.4 mg total) under the tongue every 5 (five) minutes as needed for chest pain.   potassium chloride SA (KLOR-CON M) 20 MEQ tablet Take 2 tablets (40 mEq total) by mouth daily. Take with lasix   sertraline (ZOLOFT) 100 MG tablet Take 1 tablet (100 mg total) by mouth daily.   tamsulosin (FLOMAX) 0.4 MG CAPS capsule Take 2 capsules (0.8 mg total) by mouth daily after breakfast.   No facility-administered encounter medications on file as of 05/15/2023.    Review of Systems:  Review of Systems  Health Maintenance  Topic Date Due   COVID-19 Vaccine (9 - 2023-24 season) 02/26/2023   INFLUENZA VACCINE  04/25/2023   Medicare Annual Wellness (AWV)  12/20/2023   DTaP/Tdap/Td (2 - Td or Tdap) 02/28/2033   Pneumonia Vaccine 50+ Years old  Completed   Zoster Vaccines- Shingrix  Completed   HPV VACCINES  Aged Out    Physical Exam: Vitals:   05/15/23 1411  BP: 118/72  Pulse: 87  Temp: 97.9 F (36.6 C)  SpO2: 96%  Weight: 178 lb (80.7 kg)  Height: 5\' 11"  (1.803 m)   Body mass index is 24.83 kg/m. Physical Exam Constitutional:      Appearance: Normal appearance.  Skin:    Comments: Left lower extremity with raised, erythematous nodule. Fluctuant and warm to the touch. Some calf tenderness, no pain with extension or flexion  Neurological:     Mental Status: He is alert.     Labs reviewed: Basic Metabolic Panel: Recent Labs    06/08/22 0903 11/22/22 0000 12/10/22 0000 04/11/23 1111 04/15/23 0800  NA 142 140  --  140 142  K 3.4* 3.3* 3.8 3.3* 3.8  CL 99 103  --  107 107  CO2  --  30*  --  26 27  GLUCOSE 98  --   --  114* 100*  BUN <3* 18  --  16 18  CREATININE 0.90 1.0  --  1.02 0.96  CALCIUM  --  9.3  --  8.8* 9.1   Liver Function  Tests: Recent Labs    05/31/22 1144 11/22/22 0000 04/15/23 0800  AST 20 15 12   ALT 13 9* 9  ALKPHOS 59 71  --   BILITOT 1.4*  --  0.6  PROT 6.9  --  6.3  ALBUMIN 4.1 4.3  --    No results for input(s): "LIPASE", "AMYLASE" in the last  8760 hours. No results for input(s): "AMMONIA" in the last 8760 hours. CBC: Recent Labs    05/31/22 1144 06/08/22 0903 11/22/22 0000 04/11/23 1111 04/15/23 0800  WBC 5.6  --  5.6 5.4 5.4  NEUTROABS  --   --   --   --  3,267  HGB 14.2   < > 14.2 14.3 13.8  HCT 42.8   < > 42 44.0 41.0  MCV 98.2  --   --  98.7 97.2  PLT 156  --  146* 131* 141   < > = values in this interval not displayed.   Lipid Panel: No results for input(s): "CHOL", "HDL", "LDLCALC", "TRIG", "CHOLHDL", "LDLDIRECT" in the last 8760 hours. Lab Results  Component Value Date   HGBA1C 5.7 (H) 04/15/2023    Procedures since last visit: CUP PACEART REMOTE DEVICE CHECK  Result Date: 04/30/2023 Scheduled remote reviewed. Normal device function.  There were 2 short NSVT arrhythmias detected Next remote 91 days. Hassell Halim, RN, CCDS, [employer]   Assessment/Plan Cellulitis of left lower extremity - Plan: doxycycline (VIBRA-TABS) 100 MG tablet  Hematoma of left lower leg Patient with large hematoma secondary to a fall. I&D performed to find blood present in the nodule. Discussed the importance of compression ice and elevation. ED precautions discussed. F/u with RN in 24 hours and MD in 1 week.   Risks and benefits explained. Consent obtained. Area prepped with Betadine. Using a G#27 1-1/2" needle, 3 mL of 1% lidocaine  was injected into the dermis. 2cm incision using a 10 bkade scalpel that was kept parallel to the skin. Simple pressure and steri strips applied for hemostasis. Absorbant dressing were then applied. The patient tolerated the procedure well.   Labs/tests ordered:  * No order type specified * Next appt:  05/22/2023

## 2023-05-16 NOTE — Progress Notes (Signed)
Remote pacemaker transmission.   

## 2023-05-22 ENCOUNTER — Ambulatory Visit: Payer: Medicare Other | Admitting: Student

## 2023-05-22 ENCOUNTER — Encounter: Payer: Self-pay | Admitting: Student

## 2023-05-22 VITALS — BP 118/72 | HR 83 | Temp 97.9°F | Ht 71.0 in | Wt 176.0 lb

## 2023-05-22 DIAGNOSIS — I482 Chronic atrial fibrillation, unspecified: Secondary | ICD-10-CM

## 2023-05-22 DIAGNOSIS — I5032 Chronic diastolic (congestive) heart failure: Secondary | ICD-10-CM

## 2023-05-22 DIAGNOSIS — F321 Major depressive disorder, single episode, moderate: Secondary | ICD-10-CM

## 2023-05-22 DIAGNOSIS — I10 Essential (primary) hypertension: Secondary | ICD-10-CM

## 2023-05-22 DIAGNOSIS — K769 Liver disease, unspecified: Secondary | ICD-10-CM

## 2023-05-22 DIAGNOSIS — W19XXXD Unspecified fall, subsequent encounter: Secondary | ICD-10-CM | POA: Diagnosis not present

## 2023-05-22 DIAGNOSIS — I509 Heart failure, unspecified: Secondary | ICD-10-CM | POA: Diagnosis not present

## 2023-05-22 DIAGNOSIS — S8012XA Contusion of left lower leg, initial encounter: Secondary | ICD-10-CM

## 2023-05-22 NOTE — Progress Notes (Unsigned)
Location:  [facility] [place].   Provider: Dr. Earnestine Mealing  Code Status: Full Code.  Goals of Care:     05/22/2023    2:19 PM  Advanced Directives  Does Patient Have a Medical Advance Directive? Yes  Type of Advance Directive Living will  Does patient want to make changes to medical advance directive? No - Patient declined     Chief Complaint  Patient presents with  . Medical Management of Chronic Issues    Medical Management of Chronic Issues. 1 Month Follow up  . Quality Metric Gaps    To discuss need for Covid and Flu    HPI: Patient is a 87 y.o. male seen today for medical management of chronic diseases.    He is doing well. Eating well.   His leg is doing much better.   He has not had any issues with his left leg since the I&D.   He takes a nap in the afternoon and sleeps 9 hours at night and takes 20 min nap in the morning and another 90 minute in the afternoon. He feels well- rested. He doesn't dose off often.   The only gun he has in his house is a Personal assistant. Denies SI at this time.   Not taking gabapentin.   When he was younger he had ear aches and he had his adenoids removed. They grew into his eustachian tubes. Finally the doctor used radium to treat 6-8x and never had another problem. The last year he had polyps in his sinus. He wonders if his balance issues could have He has had issues with balance but no dizziness. He is back in PT at this time.   Past Medical History:  Diagnosis Date  . Aortic atherosclerosis (HCC)   . Aortic root dilation (HCC)    a.) TTE [id number]: Ao root measured 39 mm.  Marland Kitchen BPH (benign prostatic hyperplasia)   . CHF (congestive heart failure) (HCC)   . Coronary artery disease    a.) remote PCI of the RI in 1997; stent (unknown type) placed.  . Depression   . Diverticulosis   . Eczema   . GERD (gastroesophageal reflux disease)   . Hyperlipidemia   . Hypertension   . Inguinal hernia, left   . Long term current use  of anticoagulant    a.) apixaban  . Nephrolithiasis   . NSVT (nonsustained ventricular tachycardia) (HCC)   . Osteoarthrosis   . Paroxysmal A-fib (HCC)    a.) CHA2DS2VASc = 5 (age x2, CHF, HTN, vascular disease history);  b.) rate/rhythm maintained on oral metoprolol succinate; chronically anticoagulated with apixaban  . Pharyngeal dysphagia   . Presence of permanent cardiac pacemaker 2007   a.) Medtronic device placed in [place] in 2007; b.) generator changed at [facility] on 11/18/2015  . RBBB   . SSS (sick sinus syndrome) (HCC) 2007   a.) s/p PPM placement in [place] in 2007    Past Surgical History:  Procedure Laterality Date  . BLEPHAROPLASTY Bilateral   . CARDIAC CATHETERIZATION  09/25/1995  . CARDIAC CATHETERIZATION  09/24/1998  . CHOLECYSTECTOMY    . COLONOSCOPY  09/24/2010  . CORONARY ANGIOPLASTY  09/25/1995   ramus  . ESOPHAGOGASTRODUODENOSCOPY N/A 02/11/2017   Procedure: ESOPHAGOGASTRODUODENOSCOPY (EGD);  Surgeon: Christena Deem, MD;  Location: [facility] ENDOSCOPY;  Service: Endoscopy;  Laterality: N/A;  . INGUINAL HERNIA REPAIR Left 1977  . INSERT / REPLACE / REMOVE PACEMAKER  12/23/2005   Medtronic [id number] - placed in  [place]  . INSERTION OF MESH Left 06/08/2022   Procedure: INSERTION OF MESH;  Surgeon: Campbell Lerner, MD;  Location: [facility] ORS;  Service: General;  Laterality: Left;  Marland Kitchen MANDIBLE SURGERY  1966  . PACEMAKER GENERATOR CHANGE N/A 11/18/2015  . ROBOTIC ASSISTED LAPAROSCOPIC CHOLECYSTECTOMY  01/12/2022  . TONSILLECTOMY    . TOTAL HIP ARTHROPLASTY Right 2022  . TOTAL SHOULDER REPLACEMENT     bilateral approx 2020 and 2021    Allergies  Allergen Reactions  . Amoxicillin Rash and Itching  . Bactrim [Sulfamethoxazole-Trimethoprim] Rash    Outpatient Encounter Medications as of 05/22/2023  Medication Sig  . acetaminophen (TYLENOL) 500 MG tablet Take 500 mg by mouth every 8 (eight) hours as needed.  Marland Kitchen albuterol (VENTOLIN HFA) 108 (90 Base) MCG/ACT  inhaler Inhale 2 puffs into the lungs every 6 (six) hours as needed for wheezing or shortness of breath.  Marland Kitchen apixaban (ELIQUIS) 5 MG TABS tablet Take 1 tablet (5 mg total) by mouth 2 (two) times daily.  . bisoprolol (ZEBETA) 5 MG tablet Take 1 tablet (5 mg total) by mouth daily.  . [facility]S ZINC 50 MG TABS Take 50 mg by mouth daily.  . diclofenac (VOLTAREN) 75 MG EC tablet Take 1 tablet (75 mg total) by mouth 2 (two) times daily.  Marland Kitchen doxycycline (VIBRA-TABS) 100 MG tablet Take 1 tablet (100 mg total) by mouth 2 (two) times daily for 7 days.  . finasteride (PROSCAR) 5 MG tablet Take 1 tablet (5 mg total) by mouth daily.  . fluticasone (FLONASE) 50 MCG/ACT nasal spray Place 2 sprays into both nostrils 2 (two) times daily.  . furosemide (LASIX) 40 MG tablet Take 3 tablets (120 mg total) by mouth daily. 3 tablets daily and may take 4 tablets if needed  . lidocaine (LIDODERM) 5 % Place 1 patch onto the skin daily. Remove & Discard patch within 12 hours or as directed by MD  . lovastatin (MEVACOR) 20 MG tablet TAKE 1 TABLET AT BEDTIME  . nitroGLYCERIN (NITROSTAT) 0.4 MG SL tablet Place 1 tablet (0.4 mg total) under the tongue every 5 (five) minutes as needed for chest pain.  . potassium chloride SA (KLOR-CON M) 20 MEQ tablet Take 2 tablets (40 mEq total) by mouth daily. Take with lasix  . sertraline (ZOLOFT) 100 MG tablet Take 1 tablet (100 mg total) by mouth daily.  . tamsulosin (FLOMAX) 0.4 MG CAPS capsule Take 2 capsules (0.8 mg total) by mouth daily after breakfast.   No facility-administered encounter medications on file as of 05/22/2023.    Review of Systems:  Review of Systems  Health Maintenance  Topic Date Due  . COVID-19 Vaccine (9 - 2023-24 season) 02/26/2023  . INFLUENZA VACCINE  04/25/2023  . Medicare Annual Wellness (AWV)  12/20/2023  . DTaP/Tdap/Td (2 - Td or Tdap) 02/28/2033  . Pneumonia Vaccine 69+ Years old  Completed  . Zoster Vaccines- Shingrix  Completed  . HPV VACCINES  Aged Out     Physical Exam: Vitals:   05/22/23 1417  BP: 118/72  Pulse: 83  Temp: 97.9 F (36.6 C)  SpO2: 94%  Weight: 176 lb (79.8 kg)  Height: 5\' 11"  (1.803 m)   Body mass index is 24.55 kg/m. Physical Exam  Labs reviewed: Basic Metabolic Panel: Recent Labs    06/08/22 0903 11/22/22 0000 12/10/22 0000 04/11/23 1111 04/15/23 0800  NA 142 140  --  140 142  K 3.4* 3.3* 3.8 3.3* 3.8  CL 99 103  --  107  107  CO2  --  30*  --  26 27  GLUCOSE 98  --   --  114* 100*  BUN <3* 18  --  16 18  CREATININE 0.90 1.0  --  1.02 0.96  CALCIUM  --  9.3  --  8.8* 9.1   Liver Function Tests: Recent Labs    05/31/22 1144 11/22/22 0000 04/15/23 0800  AST 20 15 12   ALT 13 9* 9  ALKPHOS 59 71  --   BILITOT 1.4*  --  0.6  PROT 6.9  --  6.3  ALBUMIN 4.1 4.3  --    No results for input(s): "LIPASE", "AMYLASE" in the last 8760 hours. No results for input(s): "AMMONIA" in the last 8760 hours. CBC: Recent Labs    05/31/22 1144 06/08/22 0903 11/22/22 0000 04/11/23 1111 04/15/23 0800  WBC 5.6  --  5.6 5.4 5.4  NEUTROABS  --   --   --   --  3,267  HGB 14.2   < > 14.2 14.3 13.8  HCT 42.8   < > 42 44.0 41.0  MCV 98.2  --   --  98.7 97.2  PLT 156  --  146* 131* 141   < > = values in this interval not displayed.   Lipid Panel: No results for input(s): "CHOL", "HDL", "LDLCALC", "TRIG", "CHOLHDL", "LDLDIRECT" in the last 8760 hours. Lab Results  Component Value Date   HGBA1C 5.7 (H) 04/15/2023    Procedures since last visit: DG Ankle Complete Left  Result Date: 05/16/2023 CLINICAL DATA:  Left ankle EXAM: LEFT ANKLE COMPLETE - 3+ VIEW COMPARISON:  None Available. FINDINGS: Osteopenia. No acute fracture or dislocation. Ankle mortise appears preserved. Tiny enthesophyte of the Achilles tendon and plantar calcaneus. No area of erosion or osseous destruction. No unexpected radiopaque foreign body. Vascular calcifications. IMPRESSION: No acute fracture or dislocation. Electronically Signed    By: Meda Klinefelter M.D.   On: 05/16/2023 13:00   CUP PACEART REMOTE DEVICE CHECK  Result Date: 04/30/2023 Scheduled remote reviewed. Normal device function.  There were 2 short NSVT arrhythmias detected Next remote 91 days. Hassell Halim, RN, CCDS, [employer]   Assessment/Plan There are no diagnoses linked to this encounter.   Labs/tests ordered:  * No order type specified * Next appt:  Visit date not found

## 2023-05-22 NOTE — Patient Instructions (Addendum)
Please try to have 30 grams of protein per meal to help maintain your muscle.   Resistance training 3x per week to help with maintaining your muscles.   Consider joining the [employer] class in [place] to help with fitness and balance.   Apply Vaseline to your left leg daily to help with wound healing.   Consider taking a Vitamin B12 supplement.

## 2023-05-23 ENCOUNTER — Encounter: Payer: Self-pay | Admitting: Student

## 2023-05-24 ENCOUNTER — Telehealth: Payer: Self-pay | Admitting: Internal Medicine

## 2023-05-24 ENCOUNTER — Telehealth: Payer: Medicare Other

## 2023-05-24 DIAGNOSIS — R911 Solitary pulmonary nodule: Secondary | ICD-10-CM

## 2023-05-24 DIAGNOSIS — N2889 Other specified disorders of kidney and ureter: Secondary | ICD-10-CM | POA: Diagnosis not present

## 2023-05-24 NOTE — Telephone Encounter (Signed)
[facility] Patient in his home  Called to speak to patient regarding most recent imaging. X-ray negative for fracture. CT scan showed some kidney stones. Cirrhosis of the liver. Lung nodule 1.8x1 cm in size. Discussed option of PET scan vs CT scan in 3 months. Patient and spouse prefer to have imaging done in 2 months instead. Discussed this could mean it's not enough time for change based on recommended interval, and they would prefer to have the imaging. Asked if patient would want bronchoscopy vs surgery depending on follow up imaging and they have not decided what they would like to do. Continue conversations.   I spent greater than 12 minutes for the care of this patient in face to face time, chart review, clinical documentation, patient education.

## 2023-05-24 NOTE — Telephone Encounter (Signed)
Dr. Malvin Johns wants to start this medication on Primidone 50 mg for his tremors, he wants to get Dr. Odessa Fleming opinion before starting patient on it.

## 2023-05-24 NOTE — Telephone Encounter (Signed)
Patient's wife called inquiring a out results for MRI and ankle xray.   Message sent to Dr. Earnestine Mealing

## 2023-05-28 NOTE — Telephone Encounter (Signed)
[facility] back- advised of the message below.  They verbalized understanding.

## 2023-06-05 ENCOUNTER — Ambulatory Visit
Admission: RE | Admit: 2023-06-05 | Discharge: 2023-06-05 | Disposition: A | Discharge status: Home / Self Care | Payer: Medicare Other | Source: Ambulatory Visit | Attending: Student | Admitting: Student

## 2023-06-05 DIAGNOSIS — R911 Solitary pulmonary nodule: Secondary | ICD-10-CM | POA: Diagnosis present

## 2023-06-05 DIAGNOSIS — N2889 Other specified disorders of kidney and ureter: Secondary | ICD-10-CM | POA: Insufficient documentation

## 2023-06-05 LAB — POCT I-STAT CREATININE: Creatinine, Ser: 1 mg/dL (ref 0.61–1.24)

## 2023-06-05 MED ORDER — IOHEXOL 300 MG/ML  SOLN
100.0000 mL | Freq: Once | INTRAMUSCULAR | Status: AC | PRN
  Administered 2023-06-05: 100 mL via INTRAVENOUS

## 2023-06-19 ENCOUNTER — Telehealth: Payer: Self-pay

## 2023-06-19 NOTE — Telephone Encounter (Signed)
Kyle Mealing, MD  You1 minute ago (4:40 PM)   VB Please contact patient for a follow up visit on Monday to discuss most recent imaging.   Patient notified and agreed.  Appointment scheduled for Monday 9/30 with Dr. Sydnee Cabal

## 2023-06-19 NOTE — Telephone Encounter (Signed)
Kyle Mealing, MD  You; Edison Simon C, CMA26 minutes ago (1:10 PM)   VB Please contact radiology to see if we can get an update on the read. It has been >2 weeks at this point.  Thanks,  VB    Final Impression from CT has resulted today and under imaging.

## 2023-06-19 NOTE — Telephone Encounter (Signed)
Message left on clinical intake voicemail:   Patient had a CT several weeks ago and never heard about results

## 2023-06-19 NOTE — Telephone Encounter (Signed)
Results are available.

## 2023-06-24 ENCOUNTER — Ambulatory Visit: Payer: Medicare Other | Admitting: Student

## 2023-06-24 VITALS — BP 120/70 | HR 77 | Resp 18 | Wt 179.0 lb

## 2023-06-24 DIAGNOSIS — R911 Solitary pulmonary nodule: Secondary | ICD-10-CM

## 2023-06-24 DIAGNOSIS — R93422 Abnormal radiologic findings on diagnostic imaging of left kidney: Secondary | ICD-10-CM | POA: Diagnosis not present

## 2023-06-24 DIAGNOSIS — N2889 Other specified disorders of kidney and ureter: Secondary | ICD-10-CM

## 2023-06-24 NOTE — Patient Instructions (Signed)
You should receive a call regarding your PET Scan to schedule an appointment in the next 1-2 weeks. If you do not, please notify us so we can help coordinate.

## 2023-06-24 NOTE — Progress Notes (Unsigned)
Location:  [facility]   Place of Service:   [facility]  Provider: Sydnee Cabal  Code Status: Full Code Goals of Care:     05/22/2023    2:19 PM  Advanced Directives  Does Patient Have a Medical Advance Directive? Yes  Type of Advance Directive Living will  Does patient want to make changes to medical advance directive? No - Patient declined     Chief Complaint  Patient presents with   Follow-up    HPI: Patient is a 87 y.o. male seen today for an acute visit for   Paient here today to discuss most recent CT scan. Left renal cysts as well as pleural lymph node. Recommendation is PET scan for aggressive medication.   In PT on the lower part of th back there is a "squishy part" that shouldn't be there. Noted on physical therapy most recently.   He is 18 and he feels that as long as he is not immobile for the rest of his life he would want to have surgery. He is willing to do any surgery necessary as long as it is not going to impact his ability to live his normal life.   Past Medical History:  Diagnosis Date   Aortic atherosclerosis (HCC)    Aortic root dilation (HCC)    a.) TTE 16109604: Ao root measured 39 mm.   BPH (benign prostatic hyperplasia)    CHF (congestive heart failure) (HCC)    Coronary artery disease    a.) remote PCI of the RI in 1997; stent (unknown type) placed.   Depression    Diverticulosis    Eczema    GERD (gastroesophageal reflux disease)    Hyperlipidemia    Hypertension    Inguinal hernia, left    Long term current use of anticoagulant    a.) apixaban   Nephrolithiasis    NSVT (nonsustained ventricular tachycardia) (HCC)    Osteoarthrosis    Paroxysmal A-fib (HCC)    a.) CHA2DS2VASc = 5 (age x2, CHF, HTN, vascular disease history);  b.) rate/rhythm maintained on oral metoprolol succinate; chronically anticoagulated with apixaban   Pharyngeal dysphagia    Presence of permanent cardiac pacemaker 2007   a.) Medtronic device placed in [place]  in 2007; b.) generator changed at [facility] on 11/18/2015   RBBB    SSS (sick sinus syndrome) (HCC) 2007   a.) s/p PPM placement in [place] in 2007    Past Surgical History:  Procedure Laterality Date   BLEPHAROPLASTY Bilateral    CARDIAC CATHETERIZATION  09/25/1995   CARDIAC CATHETERIZATION  09/24/1998   CHOLECYSTECTOMY     COLONOSCOPY  09/24/2010   CORONARY ANGIOPLASTY  09/25/1995   ramus   ESOPHAGOGASTRODUODENOSCOPY N/A 02/11/2017   Procedure: ESOPHAGOGASTRODUODENOSCOPY (EGD);  Surgeon: Christena Deem, MD;  Location: [facility] ENDOSCOPY;  Service: Endoscopy;  Laterality: N/A;   INGUINAL HERNIA REPAIR Left 1977   INSERT / REPLACE / REMOVE PACEMAKER  12/23/2005   Medtronic [id number] - placed in [place]   INSERTION OF MESH Left 06/08/2022   Procedure: INSERTION OF MESH;  Surgeon: Campbell Lerner, MD;  Location: [facility] ORS;  Service: General;  Laterality: Left;   MANDIBLE SURGERY  1966   PACEMAKER GENERATOR CHANGE N/A 11/18/2015   ROBOTIC ASSISTED LAPAROSCOPIC CHOLECYSTECTOMY  01/12/2022   TONSILLECTOMY     TOTAL HIP ARTHROPLASTY Right 2022   TOTAL SHOULDER REPLACEMENT     bilateral approx 2020 and 2021    Allergies  Allergen Reactions   Amoxicillin  Rash and Itching   Bactrim [Sulfamethoxazole-Trimethoprim] Rash    Outpatient Encounter Medications as of 06/24/2023  Medication Sig   EPINEPHrine 0.3 mg/0.3 mL IJ SOAJ injection Inject into the muscle.   primidone (MYSOLINE) 50 MG tablet Take 25 mg at night for a week then increase to 50 mg at night and continue that dose,   TRELEGY ELLIPTA 100-62.5-25 MCG/ACT AEPB Inhale 1 puff into the lungs daily.   acetaminophen (TYLENOL) 500 MG tablet Take 500 mg by mouth every 8 (eight) hours as needed.   albuterol (VENTOLIN HFA) 108 (90 Base) MCG/ACT inhaler Inhale 2 puffs into the lungs every 6 (six) hours as needed for wheezing or shortness of breath.   apixaban (ELIQUIS) 5 MG TABS tablet Take 1 tablet (5 mg total) by mouth 2 (two) times  daily.   bisoprolol (ZEBETA) 5 MG tablet Take 1 tablet (5 mg total) by mouth daily.   [employer] ZINC 50 MG TABS Take 50 mg by mouth daily.   diclofenac (VOLTAREN) 75 MG EC tablet Take 1 tablet (75 mg total) by mouth 2 (two) times daily.   finasteride (PROSCAR) 5 MG tablet Take 1 tablet (5 mg total) by mouth daily.   fluticasone (FLONASE) 50 MCG/ACT nasal spray Place 2 sprays into both nostrils 2 (two) times daily.   furosemide (LASIX) 40 MG tablet Take 3 tablets (120 mg total) by mouth daily. 3 tablets daily and may take 4 tablets if needed   lidocaine (LIDODERM) 5 % Place 1 patch onto the skin daily. Remove & Discard patch within 12 hours or as directed by MD   lovastatin (MEVACOR) 20 MG tablet TAKE 1 TABLET AT BEDTIME   nitroGLYCERIN (NITROSTAT) 0.4 MG SL tablet Place 1 tablet (0.4 mg total) under the tongue every 5 (five) minutes as needed for chest pain.   potassium chloride SA (KLOR-CON M) 20 MEQ tablet Take 2 tablets (40 mEq total) by mouth daily. Take with lasix   pregabalin (LYRICA) 25 MG capsule Take 25 mg twice a day for a week then increase to 50 mg twice a day and continue that dose   sertraline (ZOLOFT) 100 MG tablet Take 1 tablet (100 mg total) by mouth daily.   tamsulosin (FLOMAX) 0.4 MG CAPS capsule Take 2 capsules (0.8 mg total) by mouth daily after breakfast.   No facility-administered encounter medications on file as of 06/24/2023.    Review of Systems:  Review of Systems  Health Maintenance  Topic Date Due   INFLUENZA VACCINE  04/25/2023   COVID-19 Vaccine (9 - 2023-24 season) 05/26/2023   Medicare Annual Wellness (AWV)  12/20/2023   DTaP/Tdap/Td (2 - Td or Tdap) 02/28/2033   Pneumonia Vaccine 86+ Years old  Completed   Zoster Vaccines- Shingrix  Completed   HPV VACCINES  Aged Out    Physical Exam: Vitals:   06/24/23 1450  BP: 120/70  Pulse: 77  Resp: 18  Weight: 179 lb (81.2 kg)   Body mass index is 24.97 kg/m. Physical Exam Constitutional:      Appearance:  He is normal weight.  Cardiovascular:     Rate and Rhythm: Normal rate and regular rhythm.     Pulses: Normal pulses.     Heart sounds: Normal heart sounds.  Pulmonary:     Effort: Pulmonary effort is normal.     Breath sounds: Normal breath sounds.  Abdominal:     General: Abdomen is flat.     Palpations: Abdomen is soft.  Musculoskeletal:  General: Normal range of motion.  Neurological:     Mental Status: He is alert and oriented to person, place, and time.     Labs reviewed: Basic Metabolic Panel: Recent Labs    11/22/22 0000 12/10/22 0000 04/11/23 1111 04/15/23 0800 06/05/23 1015  NA 140  --  140 142  --   K 3.3* 3.8 3.3* 3.8  --   CL 103  --  107 107  --   CO2 30*  --  26 27  --   GLUCOSE  --   --  114* 100*  --   BUN 18  --  16 18  --   CREATININE 1.0  --  1.02 0.96 1.00  CALCIUM 9.3  --  8.8* 9.1  --    Liver Function Tests: Recent Labs    11/22/22 0000 04/15/23 0800  AST 15 12  ALT 9* 9  ALKPHOS 71  --   BILITOT  --  0.6  PROT  --  6.3  ALBUMIN 4.3  --    No results for input(s): "LIPASE", "AMYLASE" in the last 8760 hours. No results for input(s): "AMMONIA" in the last 8760 hours. CBC: Recent Labs    11/22/22 0000 04/11/23 1111 04/15/23 0800  WBC 5.6 5.4 5.4  NEUTROABS  --   --  3,267  HGB 14.2 14.3 13.8  HCT 42 44.0 41.0  MCV  --  98.7 97.2  PLT 146* 131* 141   Lipid Panel: No results for input(s): "CHOL", "HDL", "LDLCALC", "TRIG", "CHOLHDL", "LDLDIRECT" in the last 8760 hours. Lab Results  Component Value Date   HGBA1C 5.7 (H) 04/15/2023    Procedures since last visit: CT ABDOMEN PELVIS W WO CONTRAST  Result Date: 06/19/2023 CLINICAL DATA:  Indeterminate renal mass/cyst. EXAM: CT ABDOMEN AND PELVIS WITHOUT AND WITH CONTRAST TECHNIQUE: Multidetector CT imaging of the abdomen and pelvis was performed following the standard protocol before and following the bolus administration of intravenous contrast. RADIATION DOSE REDUCTION:  This exam was performed according to the departmental dose-optimization program which includes automated exposure control, adjustment of the mA and/or kV according to patient size and/or use of iterative reconstruction technique. CONTRAST:  OMNIPAQUE IOHEXOL 300 MG/ML  SOLN COMPARISON:  05/14/2023 and 01/10/2022. FINDINGS: Lower chest: Mild bibasilar scarring. Nodular thickening along the lateral right hemidiaphragm measures 1.1 x 2.0 cm (3/32), stable, but new from 01/10/2022. Heart is enlarged. No pericardial or pleural effusion. Distal esophagus is grossly unremarkable. Hepatobiliary: Liver margin is irregular. Probable cysts in the liver. No abnormal arterial phase enhancement. Cholecystectomy. No biliary ductal dilatation. Pancreas: Negative. Spleen: Negative. Adrenals/Urinary Tract: Adrenal glands are unremarkable. Bilateral renal stones. 1.1 x 1.3 cm low-attenuation lesion in the lateral interpolar left kidney (16/67) has internal nodularity, stable from 05/14/2023 but new from 09/23/2014. Additional cysts in the kidneys bilaterally. Ureters are decompressed. Bladder is somewhat obscured by streak artifact from a right hip arthroplasty. Bladder diverticula. Stomach/Bowel: Stomach, small bowel and colon are unremarkable. Appendix is not readily visualized. Vascular/Lymphatic: Atherosclerotic calcification of the aorta. No pathologically enlarged lymph nodes. Reproductive: Enlarged prostate. Other: Bilateral inguinal hernias contain fat, right greater than left. No free fluid. Mesenteries and peritoneum are unremarkable. Musculoskeletal: Right hip arthroplasty. Osteopenia. Degenerative changes in the spine. Old right rib fractures. No worrisome lytic or sclerotic lesions. Levoconvex scoliosis. Old T12 and L1 compression fractures. IMPRESSION: 1. Complex cystic lesion in the interpolar left kidney. Renal cell carcinoma cannot be excluded. Recommend MR abdomen without and with contrast in further  evaluation. 2. Pleural nodule along the right hemidiaphragm, stable from 05/14/2023 but new from 01/10/2022. Recommend continued attention on follow-up. If a more aggressive approach is desired, PET could be performed. 3. Cirrhosis. 4. Bilateral renal stones. 5. Enlarged prostate. 6.  Aortic atherosclerosis (ICD10-I70.0). Electronically Signed   By: Leanna Battles M.D.   On: 06/19/2023 11:43    Assessment/Plan Lung nodule - Plan: NM PET Image Initial (PI) Skull Base To Thigh (F-18 FDG)  Abnormal radiologic findings on diagnostic imaging of left kidney - Plan: NM PET Image Initial (PI) Skull Base To Thigh (F-18 FDG) Patient's most recent CT scan notable for two concerning findings. Patient has had renal cysts for 9 years per imaging from 2015, however, there are recent findings concerning for abnormalities in the cyst. Also a nodule of the right pleural space for which the recommendation is further characterization with a PET scan. PET scan ordered.   Labs/tests ordered:  * No order type specified * Next appt:  07/22/2023  I spent greater than 40 minutes for the care of this patient in face to face time, chart review, clinical documentation, patient education.

## 2023-06-26 ENCOUNTER — Encounter: Payer: Self-pay | Admitting: Student

## 2023-07-03 ENCOUNTER — Ambulatory Visit
Admission: RE | Admit: 2023-07-03 | Discharge: 2023-07-03 | Disposition: A | Discharge status: Home / Self Care | Payer: Medicare Other | Source: Ambulatory Visit | Attending: Student | Admitting: Student

## 2023-07-03 DIAGNOSIS — N281 Cyst of kidney, acquired: Secondary | ICD-10-CM | POA: Insufficient documentation

## 2023-07-03 DIAGNOSIS — R93422 Abnormal radiologic findings on diagnostic imaging of left kidney: Secondary | ICD-10-CM | POA: Diagnosis not present

## 2023-07-03 DIAGNOSIS — I700 Atherosclerosis of aorta: Secondary | ICD-10-CM | POA: Diagnosis not present

## 2023-07-03 DIAGNOSIS — Z96641 Presence of right artificial hip joint: Secondary | ICD-10-CM | POA: Diagnosis not present

## 2023-07-03 DIAGNOSIS — N289 Disorder of kidney and ureter, unspecified: Secondary | ICD-10-CM | POA: Insufficient documentation

## 2023-07-03 DIAGNOSIS — I517 Cardiomegaly: Secondary | ICD-10-CM | POA: Diagnosis not present

## 2023-07-03 DIAGNOSIS — R911 Solitary pulmonary nodule: Secondary | ICD-10-CM | POA: Diagnosis present

## 2023-07-03 DIAGNOSIS — I2510 Atherosclerotic heart disease of native coronary artery without angina pectoris: Secondary | ICD-10-CM | POA: Diagnosis not present

## 2023-07-03 LAB — GLUCOSE, CAPILLARY: Glucose-Capillary: 110 mg/dL — ABNORMAL HIGH (ref 70–99)

## 2023-07-03 MED ORDER — FLUDEOXYGLUCOSE F - 18 (FDG) INJECTION
9.9800 | Freq: Once | INTRAVENOUS | Status: AC | PRN
  Administered 2023-07-03: 9.98 via INTRAVENOUS

## 2023-07-04 ENCOUNTER — Ambulatory Visit: Payer: Medicare Other | Admitting: Podiatry

## 2023-07-04 DIAGNOSIS — B351 Tinea unguium: Secondary | ICD-10-CM | POA: Diagnosis not present

## 2023-07-04 DIAGNOSIS — L84 Corns and callosities: Secondary | ICD-10-CM | POA: Diagnosis not present

## 2023-07-04 DIAGNOSIS — I739 Peripheral vascular disease, unspecified: Secondary | ICD-10-CM | POA: Diagnosis not present

## 2023-07-04 DIAGNOSIS — M79675 Pain in left toe(s): Secondary | ICD-10-CM

## 2023-07-04 DIAGNOSIS — M79674 Pain in right toe(s): Secondary | ICD-10-CM

## 2023-07-04 NOTE — Progress Notes (Signed)
Subjective:  Patient ID: Kyle Morales, male    DOB: 1936-03-22,  MRN: [id number]  87 y.o. male presents with  Chief Complaint  Patient presents with   RFC   at risk foot care. Patient has h/o PAD and callus(es) right foot and painful thick toenails that are difficult to trim. Painful toenails interfere with ambulation. Aggravating factors include wearing enclosed shoe gear. Pain is relieved with periodic professional debridement. Painful calluses are aggravated when weightbearing with and without shoegear. Pain is relieved with periodic professional debridement.  PCP: Earnestine Mealing, MD.  New problem(s): None.   Review of Systems: Negative except as noted in the HPI.   Allergies  Allergen Reactions   Amoxicillin Rash and Itching   Bactrim [Sulfamethoxazole-Trimethoprim] Rash    Objective:  There were no vitals filed for this visit. Constitutional Patient is a pleasant 87 y.o. Caucasian male WD, WN in NAD. AAO x 3.  Vascular Capillary fill time to digits <3 seconds.  DP pulse(s) are faintly palpable b/l lower extremities. PT pulses are nonpalpable b/l. Pedal hair absent b/l. Lower extremity skin temperature gradient warm to cool b/l. No pain with calf compression b/l. No cyanosis or clubbing noted. No ischemia nor gangrene noted b/l.   Neurologic Protective sensation intact 5/5 intact bilaterally with 10g monofilament b/l. Vibratory sensation intact b/l. No clonus b/l.   Dermatologic Pedal skin is thin, shiny and atrophic b/l.  No open wounds b/l lower extremities. No interdigital macerations b/l lower extremities. Toenails 1-5 b/l elongated, discolored, dystrophic, thickened, crumbly with subungual debris and tenderness to dorsal palpation.   Hyperkeratotic lesion(s) submet head 5 right foot.  No erythema, no edema, no drainage, no fluctuance.  Orthopedic: Normal muscle strength 5/5 to all lower extremity muscle groups bilaterally. Hammertoe(s) noted to the 1-5 left foot and 1-5  right foot.   Last HgA1c:     Latest Ref Rng & Units 04/15/2023    8:00 AM 11/22/2022   12:00 AM  Hemoglobin A1C  Hemoglobin-A1c <5.7 % of total Hgb 5.7  5.7         This result is from an external source.     Assessment:   1. Pain due to onychomycosis of toenails of both feet   2. Callus   3. Peripheral vascular disease with claudication (HCC)    Plan:  Patient was evaluated and treated and all questions answered. Consent given for treatment as described below: -Consent given for treatment as described below: -Examined patient. -Continue supportive shoe gear daily. -Mycotic toenails 1-5 bilaterally were debrided in length and girth with sterile nail nippers and dremel without incident. -Patient/POA to call should there be question/concern in the interim.  Return in about 9 weeks (around 09/05/2023).  Freddie Breech, DPM

## 2023-07-09 ENCOUNTER — Encounter: Payer: Self-pay | Admitting: Podiatry

## 2023-07-22 ENCOUNTER — Ambulatory Visit: Payer: Medicare Other | Admitting: Student

## 2023-07-24 ENCOUNTER — Encounter: Payer: Self-pay | Admitting: Student

## 2023-07-24 ENCOUNTER — Ambulatory Visit: Payer: Medicare Other | Admitting: Student

## 2023-07-24 VITALS — BP 132/84 | HR 84 | Temp 97.7°F | Ht 71.0 in | Wt 170.0 lb

## 2023-07-24 DIAGNOSIS — R93422 Abnormal radiologic findings on diagnostic imaging of left kidney: Secondary | ICD-10-CM | POA: Diagnosis not present

## 2023-07-24 DIAGNOSIS — R911 Solitary pulmonary nodule: Secondary | ICD-10-CM | POA: Diagnosis not present

## 2023-07-24 NOTE — Patient Instructions (Signed)
Great to see you today! I'm so glad we have good news for your lung and kidneys!   I hope you have a wonderful holiday season!

## 2023-07-24 NOTE — Progress Notes (Unsigned)
Location:  [facility]   Provider: Dr. Earnestine Mealing  Code Status: Full Code Goals of Care:     07/24/2023    2:Kyle PM  Advanced Directives  Does Patient Have a Medical Advance Directive? Yes  Type of Advance Directive Living will  Does patient want to make changes to medical advance directive? No - Patient declined     Chief Complaint  Patient presents with   Medical Management of Chronic Issues    Medical Management of Chronic Issues. 2 Month Follow up    HPI: Patient is a 87 y.o. Morales seen today for medical management of chronic diseases.    Lung nodule was negative.  He is a lifelong non-smoker but his entire family smoked and he had a lot of second hand smoke.  Past Medical History:  Diagnosis Date   Aortic atherosclerosis (HCC)    Aortic root dilation (HCC)    a.) TTE 16109604: Ao root measured 39 mm.   BPH (benign prostatic hyperplasia)    CHF (congestive heart failure) (HCC)    Coronary artery disease    a.) remote PCI of the RI in 1997; stent (unknown type) placed.   Depression    Diverticulosis    Eczema    GERD (gastroesophageal reflux disease)    Hyperlipidemia    Hypertension    Inguinal hernia, left    Long term current use of anticoagulant    a.) apixaban   Nephrolithiasis    NSVT (nonsustained ventricular tachycardia) (HCC)    Osteoarthrosis    Paroxysmal A-fib (HCC)    a.) CHA2DS2VASc = 5 (age x2, CHF, HTN, vascular disease history);  b.) rate/rhythm maintained on oral metoprolol succinate; chronically anticoagulated with apixaban   Pharyngeal dysphagia    Presence of permanent cardiac pacemaker 2007   a.) Medtronic device placed in [place] in 2007; b.) generator changed at [facility] on 11/18/2015   RBBB    SSS (sick sinus syndrome) (HCC) 2007   a.) s/p PPM placement in [place] in 2007    Past Surgical History:  Procedure Laterality Date   BLEPHAROPLASTY Bilateral    CARDIAC CATHETERIZATION  09/25/1995   CARDIAC  CATHETERIZATION  09/24/1998   CHOLECYSTECTOMY     COLONOSCOPY  09/24/2010   CORONARY ANGIOPLASTY  09/25/1995   ramus   ESOPHAGOGASTRODUODENOSCOPY N/A 02/11/2017   Procedure: ESOPHAGOGASTRODUODENOSCOPY (EGD);  Surgeon: Christena Deem, MD;  Location: [facility] ENDOSCOPY;  Service: Endoscopy;  Laterality: N/A;   INGUINAL HERNIA REPAIR Left 1977   INSERT / REPLACE / REMOVE PACEMAKER  12/23/2005   Medtronic [id number] - placed in [place]   INSERTION OF MESH Left 06/08/2022   Procedure: INSERTION OF MESH;  Surgeon: Campbell Lerner, MD;  Location: [facility] ORS;  Service: General;  Laterality: Left;   MANDIBLE SURGERY  1966   PACEMAKER GENERATOR CHANGE N/A 11/18/2015   ROBOTIC ASSISTED LAPAROSCOPIC CHOLECYSTECTOMY  01/12/2022   TONSILLECTOMY     TOTAL HIP ARTHROPLASTY Right 2022   TOTAL SHOULDER REPLACEMENT     bilateral approx 2020 and 2021    Allergies  Allergen Reactions   Amoxicillin Rash and Itching   Bactrim [Sulfamethoxazole-Trimethoprim] Rash    Outpatient Encounter Medications as of 07/24/2023  Medication Sig   acetaminophen (TYLENOL) 500 MG tablet Take 500 mg by mouth every 8 (eight) hours as needed.   albuterol (VENTOLIN HFA) 108 (90 Base) MCG/ACT inhaler Inhale 2 puffs into the lungs every 6 (six) hours as needed for wheezing or shortness of breath.  apixaban (ELIQUIS) 5 MG TABS tablet Take 1 tablet (5 mg total) by mouth 2 (two) times daily.   bisoprolol (ZEBETA) 5 MG tablet Take 1 tablet (5 mg total) by mouth daily.   [employer]S ZINC 50 MG TABS Take 50 mg by mouth daily.   diclofenac (VOLTAREN) 75 MG EC tablet Take 1 tablet (75 mg total) by mouth 2 (two) times daily.   EPINEPHrine 0.3 mg/0.3 mL IJ SOAJ injection Inject into the muscle.   finasteride (PROSCAR) 5 MG tablet Take 1 tablet (5 mg total) by mouth daily.   fluticasone (FLONASE) 50 MCG/ACT nasal spray Place 2 sprays into both nostrils 2 (two) times daily.   furosemide (LASIX) 40 MG tablet Take 3 tablets (120 mg total) by  mouth daily. 3 tablets daily and may take 4 tablets if needed   lidocaine (LIDODERM) 5 % Place 1 patch onto the skin daily. Remove & Discard patch within 12 hours or as directed by MD   lovastatin (MEVACOR) 20 MG tablet TAKE 1 TABLET AT BEDTIME   nitroGLYCERIN (NITROSTAT) 0.4 MG SL tablet Place 1 tablet (0.4 mg total) under the tongue every 5 (five) minutes as needed for chest pain.   potassium chloride SA (KLOR-CON M) 20 MEQ tablet Take 2 tablets (40 mEq total) by mouth daily. Take with lasix   pregabalin (LYRICA) 25 MG capsule Take 25 mg twice a day for a week then increase to 50 mg twice a day and continue that dose   primidone (MYSOLINE) 50 MG tablet Take 25 mg at night for a week then increase to 50 mg at night and continue that dose,   sertraline (ZOLOFT) 100 MG tablet Take 1 tablet (100 mg total) by mouth daily.   tamsulosin (FLOMAX) 0.4 MG CAPS capsule Take 2 capsules (0.8 mg total) by mouth daily after breakfast.   TRELEGY ELLIPTA 100-62.5-25 MCG/ACT AEPB Inhale 1 puff into the lungs daily.   No facility-administered encounter medications on file as of 07/24/2023.    Review of Systems:  Review of Systems  Health Maintenance  Topic Date Due   COVID-19 Vaccine (10 - 2023-24 season) 08/16/2023   Medicare Annual Wellness (AWV)  12/20/2023   DTaP/Tdap/Td (2 - Td or Tdap) 02/28/2033   Pneumonia Vaccine 67+ Years old  Completed   INFLUENZA VACCINE  Completed   Zoster Vaccines- Shingrix  Completed   HPV VACCINES  Aged Out    Physical Exam: Vitals:   07/24/23 1442  BP: 132/84  Pulse: 84  Temp: 97.7 F (36.5 C)  SpO2: 95%  Weight: 170 lb (77.1 kg)  Height: 5\' 11"  (1.803 m)   Body mass index is 23.71 kg/m. Physical Exam Constitutional:      Appearance: Normal appearance.  Cardiovascular:     Rate and Rhythm: Normal rate.  Neurological:     Mental Status: He is alert.     Labs reviewed: Basic Metabolic Panel: Recent Labs    11/22/22 0000 12/10/22 0000  04/11/23 1111 04/15/23 0800 06/05/23 1015  NA 140  --  140 142  --   K 3.3* 3.8 3.3* 3.8  --   CL 103  --  107 107  --   CO2 30*  --  26 27  --   GLUCOSE  --   --  114* 100*  --   BUN 18  --  16 18  --   CREATININE 1.0  --  1.02 0.96 1.00  CALCIUM 9.3  --  8.8* 9.1  --  Liver Function Tests: Recent Labs    11/22/22 0000 04/15/23 0800  AST 15 12  ALT 9* 9  ALKPHOS 71  --   BILITOT  --  0.6  PROT  --  6.3  ALBUMIN 4.3  --    No results for input(s): "LIPASE", "AMYLASE" in the last 8760 hours. No results for input(s): "AMMONIA" in the last 8760 hours. CBC: Recent Labs    11/22/22 0000 04/11/23 1111 04/15/23 0800  WBC 5.6 5.4 5.4  NEUTROABS  --   --  3,267  HGB 14.2 14.3 13.8  HCT 42 Kyle.0 41.0  MCV  --  98.7 97.2  PLT 146* 131* 141   Lipid Panel: No results for input(s): "CHOL", "HDL", "LDLCALC", "TRIG", "CHOLHDL", "LDLDIRECT" in the last 8760 hours. Lab Results  Component Value Date   HGBA1C 5.7 (H) 04/15/2023    Procedures since last visit: NM PET Image Initial (PI) Skull Base To Thigh (F-18 FDG)  Result Date: 07/13/2023 CLINICAL DATA:  Initial treatment strategy for right lower lobe nodule and complex cystic right renal lesion. EXAM: NUCLEAR MEDICINE PET SKULL BASE TO THIGH TECHNIQUE: 10.0 mCi F-18 FDG was injected intravenously. Full-ring PET imaging was performed from the skull base to thigh after the radiotracer. CT data was obtained and used for attenuation correction and anatomic localization. Fasting blood glucose: 110 mg/dl COMPARISON:  CT abdomen/pelvis dated 06/05/2023 FINDINGS: Mediastinal blood pool activity: SUV max 2.7 Liver activity: SUV max NA NECK: No hypermetabolic lymph nodes in the neck. Incidental CT findings: None. CHEST: No suspicious pulmonary nodules. 11 mm pleural-based nodule in the right lung base (series 6/image 60), non FDG avid. No hypermetabolic thoracic lymphadenopathy. Incidental CT findings: Mild cardiomegaly. Left subclavian  pacemaker. Atherosclerotic calcifications of the aortic arch. Moderate three-vessel coronary atherosclerosis. ABDOMEN/PELVIS: No abnormal hypermetabolic activity within the liver, pancreas, adrenal glands, or spleen. No hypermetabolic lymph nodes in the abdomen or pelvis. Bilateral simple renal cysts, non FDG avid, benign. The suspected small complex cystic lesion in the lateral interpolar left kidney is not well visualized on unenhanced CT. Incidental CT findings: Hepatic cysts. Nonobstructing bilateral renal calculi measuring up to 10 mm in the right lower pole (series 6/image 22). Sigmoid diverticulosis, with insert diverticulitis. Prostatomegaly. SKELETON: No focal hypermetabolic activity to suggest skeletal metastasis. Incidental CT findings: Mild degenerative changes of the lumbar spine. Right hip arthroplasty. IMPRESSION: 11 mm pleural-based nodule in the right lung base, non FDG avid, benign. Suspected small complex cystic lesion in the lateral interpolar left kidney is not well visualized on unenhanced CT. If clinically warranted, consider MRI abdomen with/without contrast. Additional ancillary findings as above. Electronically Signed   By: Charline Bills M.D.   On: 07/13/2023 01:40    Assessment/Plan Lung nodule  Abnormal radiologic findings on diagnostic imaging of left kidney Patient seen today for follow up of imaging for lung nodule and left kidney finding. Lung nodule benign. Declines further imaging for renal findings. This is a chronic findings, and patient does not desire further work up at this time.   Labs/tests ordered:  * No order type specified * Next appt:  12/31/2023  I spent greater than 30  minutes for the care of this patient in face to face time, chart review, clinical documentation, patient education. I spent an additional 16 minutes discussing goals of care and advanced care planning.

## 2023-07-26 ENCOUNTER — Ambulatory Visit: Payer: Medicare Other | Attending: Cardiovascular Disease | Admitting: Cardiovascular Disease

## 2023-07-26 ENCOUNTER — Encounter: Payer: Self-pay | Admitting: Cardiovascular Disease

## 2023-07-26 VITALS — BP 100/62 | HR 94 | Ht 71.0 in | Wt 178.5 lb

## 2023-07-26 DIAGNOSIS — I4729 Other ventricular tachycardia: Secondary | ICD-10-CM | POA: Insufficient documentation

## 2023-07-26 DIAGNOSIS — I25118 Atherosclerotic heart disease of native coronary artery with other forms of angina pectoris: Secondary | ICD-10-CM | POA: Insufficient documentation

## 2023-07-26 DIAGNOSIS — I509 Heart failure, unspecified: Secondary | ICD-10-CM | POA: Insufficient documentation

## 2023-07-26 DIAGNOSIS — D6869 Other thrombophilia: Secondary | ICD-10-CM | POA: Insufficient documentation

## 2023-07-26 DIAGNOSIS — Z950 Presence of cardiac pacemaker: Secondary | ICD-10-CM | POA: Diagnosis present

## 2023-07-26 DIAGNOSIS — I480 Paroxysmal atrial fibrillation: Secondary | ICD-10-CM | POA: Insufficient documentation

## 2023-07-26 DIAGNOSIS — I495 Sick sinus syndrome: Secondary | ICD-10-CM | POA: Insufficient documentation

## 2023-07-26 MED ORDER — LOVASTATIN 20 MG PO TABS: 20.0000 mg | ORAL_TABLET | Freq: Every day | ORAL | 3 refills | Status: DC

## 2023-07-26 NOTE — Progress Notes (Signed)
Cardiology Office Note  Date:  07/26/2023   ID:  Kyle Morales, DOB May 08, 1936, MRN [id number]  PCP:  Earnestine Mealing, MD   Chief Complaint  Patient presents with   8 month follow up     Patient c/o shortness of breath with any amount of activity. Medications reviewed by the patient verbally.      HPI:  Kyle Morales is a  87 year-old gentleman history of  paroxysmal atrial fibrillation dating back to 1992   coronary artery disease with stent placement at [facility] in 1997,  sinus node dysfunction pacemaker placement in [place] in 2007 for diagnosis of heart block chronic diarrhea Pacemaker change out done at [facility],  Who presents for followup of his paroxysmal atrial fibrillation and coronary artery disease.  LOV with myself June 2024 Seen by EP July 2024  Lives at [facility] PT 2x a week, balance Uses a cane  Reports he continues to have chronic SOB, chronic cough Sinuses worse recently By pulmonary, prior PFTs reviewed On Trelegy, Atrovent On lasix 120 daily, sometimes takes extra Lasix  Blood pressure low, denies orthostasis symptoms  In the past has tried several different beta-blockers including atenolol, metoprolol, bystolic and bisoprolol Less fatigue on bisoprolol 5 daily  Tremor continues to be a problem Walks with a cane  Lab work reviewed Stable BMP  Pacemaker downloads reviewed  EKG personally reviewed by myself on todays visit EKG Interpretation Date/Time:  Friday July 26 2023 10:11:29 EDT Ventricular Rate:  80 PR Interval:  286 QRS Duration:  118 QT Interval:  404 QTC Calculation: 465 R Axis:   -13  Text Interpretation: Atrial-paced rhythm with prolonged AV conduction Incomplete right bundle branch block Minimal voltage criteria for LVH, may be normal variant ( R in aVL ) ST & T wave abnormality, consider anterior ischemia When compared with ECG of 11-Apr-2023 10:07, No significant change was found Confirmed by Kyle Morales [phone]) on 07/26/2023  10:32:13 AM   With past medical history reviewed  hip surgery 12/20/20 at [facility] Notes reviewed, EKGs indicating he developed accelerated junctional rhythm  ECG: 12/23/20 atrial tach, RBBB, single V paced complex Cardiology was consulted at [facility], they reported he had atrial tachycardia Initially placed on diltiazem and metoprolol, metoprolol was held for low blood pressure  Echo at [facility]   2. Echo contrast utilized to enhance endocardial border definition.    3. The left ventricular systolic function is normal, LVEF is visually  estimated at 55-60%.    4. The right ventricle is not well visualized but probably normal in size,  with normal systolic function.   Went to [place] 10 days for PT  Prior history of UTI, went to [facility] treated with ABX, enterococcus Faecalis in the urine culture  Echocardiogram March 14, 2017 Results discussed with him in detail Ejection fraction 50-55%, mildly dilated aortic root 39 mm  Left atrium severely dilated, mild to moderate MR   Previous history of atrial fibrillation in the setting of GI distress and diarrhea   cultures were negative. Managed by GI   PMH:   has a past medical history of Aortic atherosclerosis (HCC), Aortic root dilation (HCC), BPH (benign prostatic hyperplasia), CHF (congestive heart failure) (HCC), Coronary artery disease, Depression, Diverticulosis, Eczema, GERD (gastroesophageal reflux disease), Hyperlipidemia, Hypertension, Inguinal hernia, left, Long term current use of anticoagulant, Nephrolithiasis, NSVT (nonsustained ventricular tachycardia) (HCC), Osteoarthrosis, Paroxysmal A-fib (HCC), Pharyngeal dysphagia, Presence of permanent cardiac pacemaker (2007), RBBB, and SSS (sick sinus syndrome) (HCC) (2007).  PSH:  Past Surgical History:  Procedure Laterality Date   BLEPHAROPLASTY Bilateral    CARDIAC CATHETERIZATION  09/25/1995   CARDIAC CATHETERIZATION  09/24/1998   CHOLECYSTECTOMY     COLONOSCOPY  09/24/2010   CORONARY  ANGIOPLASTY  09/25/1995   ramus   ESOPHAGOGASTRODUODENOSCOPY N/A 02/11/2017   Procedure: ESOPHAGOGASTRODUODENOSCOPY (EGD);  Surgeon: Christena Deem, MD;  Location: [facility] ENDOSCOPY;  Service: Endoscopy;  Laterality: N/A;   INGUINAL HERNIA REPAIR Left 1977   INSERT / REPLACE / REMOVE PACEMAKER  12/23/2005   Medtronic [id number] - placed in [place]   INSERTION OF MESH Left 06/08/2022   Procedure: INSERTION OF MESH;  Surgeon: Campbell Lerner, MD;  Location: [facility] ORS;  Service: General;  Laterality: Left;   MANDIBLE SURGERY  1966   PACEMAKER GENERATOR CHANGE N/A 11/18/2015   ROBOTIC ASSISTED LAPAROSCOPIC CHOLECYSTECTOMY  01/12/2022   TONSILLECTOMY     TOTAL HIP ARTHROPLASTY Right 2022   TOTAL SHOULDER REPLACEMENT     bilateral approx 2020 and 2021    Current Outpatient Medications  Medication Sig Dispense Refill   acetaminophen (TYLENOL) 500 MG tablet Take 500 mg by mouth every 8 (eight) hours as needed.     albuterol (VENTOLIN HFA) 108 (90 Base) MCG/ACT inhaler Inhale 2 puffs into the lungs every 6 (six) hours as needed for wheezing or shortness of breath. 8 g 3   apixaban (ELIQUIS) 5 MG TABS tablet Take 1 tablet (5 mg total) by mouth 2 (two) times daily. 180 tablet 3   bisoprolol (ZEBETA) 5 MG tablet Take 1 tablet (5 mg total) by mouth daily. 90 tablet 3   [facility] ZINC 50 MG TABS Take 50 mg by mouth daily.     diclofenac (VOLTAREN) 75 MG EC tablet Take 1 tablet (75 mg total) by mouth 2 (two) times daily. 30 tablet 0   finasteride (PROSCAR) 5 MG tablet Take 1 tablet (5 mg total) by mouth daily. 90 tablet 3   fluticasone (FLONASE) 50 MCG/ACT nasal spray Place 2 sprays into both nostrils 2 (two) times daily. 18 mL 3   furosemide (LASIX) 40 MG tablet Take 3 tablets (120 mg total) by mouth daily. 3 tablets daily and may take 4 tablets if needed 270 tablet 3   lidocaine (LIDODERM) 5 % Place 1 patch onto the skin daily. Remove & Discard patch within 12 hours or as directed by MD 30 patch 0    lovastatin (MEVACOR) 20 MG tablet TAKE 1 TABLET AT BEDTIME 90 tablet 3   nitroGLYCERIN (NITROSTAT) 0.4 MG SL tablet Place 1 tablet (0.4 mg total) under the tongue every 5 (five) minutes as needed for chest pain. 25 tablet 1   potassium chloride SA (KLOR-CON M) 20 MEQ tablet Take 2 tablets (40 mEq total) by mouth daily. Take with lasix     pregabalin (LYRICA) 25 MG capsule Take 25 mg twice a day for a week then increase to 50 mg twice a day and continue that dose     primidone (MYSOLINE) 50 MG tablet Take 25 mg at night for a week then increase to 50 mg at night and continue that dose,     sertraline (ZOLOFT) 100 MG tablet Take 1 tablet (100 mg total) by mouth daily. 90 tablet 3   tamsulosin (FLOMAX) 0.4 MG CAPS capsule Take 2 capsules (0.8 mg total) by mouth daily after breakfast. 180 capsule 3   TRELEGY ELLIPTA 100-62.5-25 MCG/ACT AEPB Inhale 1 puff into the lungs daily.     EPINEPHrine 0.3 mg/0.3 mL IJ  SOAJ injection Inject into the muscle. (Patient not taking: Reported on 07/26/2023)     No current facility-administered medications for this visit.    Allergies:   Amoxicillin and Bactrim [sulfamethoxazole-trimethoprim]   Social History:  The patient  reports that he has never smoked. He has never used smokeless tobacco. He reports current alcohol use. He reports that he does not use drugs.   Family History:   family history includes Heart attack in his father; Heart disease in his brother and mother; Hyperlipidemia in his mother; Hypertension in his mother.    Review of Systems: Review of Systems  HENT: Negative.    Respiratory:  Positive for cough and shortness of breath.   Cardiovascular: Negative.   Gastrointestinal: Negative.   Musculoskeletal: Negative.        Leg weakness  Neurological:  Positive for tremors.  Psychiatric/Behavioral: Negative.    All other systems reviewed and are negative.   PHYSICAL EXAM: VS:  BP 100/62 (BP Location: Left Arm, Patient Position: Sitting,  Cuff Size: Normal)   Pulse 94   Ht 5\' 11"  (1.803 m)   Wt 178 lb 8 oz (81 kg)   SpO2 94%   BMI 24.90 kg/m  , BMI Body mass index is 24.9 kg/m. Constitutional:  oriented to person, place, and time. No distress.  HENT:  Head: Grossly normal Eyes:  no discharge. No scleral icterus.  Neck: No JVD, no carotid bruits  Cardiovascular: Regular rate and rhythm, no murmurs appreciated Pulmonary/Chest: Clear to auscultation bilaterally, no wheezes or rails Abdominal: Soft.  no distension.  no tenderness.  Musculoskeletal: Normal range of motion Neurological:  normal muscle tone. Coordination normal. No atrophy Skin: Skin warm and dry Psychiatric: normal affect, pleasant  Recent Labs: 04/15/2023: ALT 9; BUN 18; Hemoglobin 13.8; Platelets 141; Potassium 3.8; Sodium 142 06/05/2023: Creatinine, Ser 1.00    Lipid Panel Lab Results  Component Value Date   CHOL 136 02/20/2017   HDL 32 (L) 02/20/2017   LDLCALC 70 02/20/2017   TRIG 172 (H) 02/20/2017      Wt Readings from Last 3 Encounters:  07/26/23 178 lb 8 oz (81 kg)  07/24/23 170 lb (77.1 kg)  06/24/23 179 lb (81.2 kg)     ASSESSMENT AND PLAN:  Atrial fibrillation, unspecified type (HCC) - Low A-fib burden, asymptomatic On eliquis,  Bisoprolol 5 daily Followed by EP  Nonsustained VT Monitored by EP on pacer downloads Rare nonsustained VT  Shortness of breath Chronic issue, likely multifactorial Followed by pulmonary Low normal ejection fraction 50 to 55%, stable on Lasix 120 daily with extra Lasix as needed Deconditioned, continues PT twice a week, no regular walking program  Pure hypercholesterolemia On lovastatin,  Cholesterol typically at goal  Atherosclerosis of native coronary artery of native heart  Currently with no symptoms of angina. No further workup at this time. Continue current medication regimen.  Orthostatic hypotension Blood pressure low but stable, asymptomatic on bisoprolol 5 mg daily  Blood  pressure likely exacerbated by Flomax  Leg swelling No edema on today's visit, continue Lasix 120 daily  Hypothyroidism Followed by primary care    Orders Placed This Encounter  Procedures   EKG 12-Lead     Signed, Dossie Arbour, M.D., Ph.D. 07/26/2023  [facility] [place], [place] [phone]

## 2023-07-26 NOTE — Patient Instructions (Signed)

## 2023-07-30 ENCOUNTER — Ambulatory Visit (INDEPENDENT_AMBULATORY_CARE_PROVIDER_SITE_OTHER): Payer: Medicare Other

## 2023-07-30 DIAGNOSIS — I495 Sick sinus syndrome: Secondary | ICD-10-CM | POA: Diagnosis not present

## 2023-07-31 LAB — CUP PACEART REMOTE DEVICE CHECK
Battery Remaining Longevity: 25 mo
Battery Voltage: 2.93 V
Brady Statistic AP VP Percent: 0.8 %
Brady Statistic AP VS Percent: 97.41 %
Brady Statistic AS VP Percent: 0.06 %
Brady Statistic AS VS Percent: 1.73 %
Brady Statistic RA Percent Paced: 97.74 %
Brady Statistic RV Percent Paced: 1.01 %
Date Time Interrogation Session: 20241105102652
Implantable Lead Connection Status: 753985
Implantable Lead Connection Status: 753985
Implantable Lead Implant Date: 20070411
Implantable Lead Implant Date: 20070411
Implantable Lead Location: 753859
Implantable Lead Location: 753860
Implantable Lead Model: 5076
Implantable Lead Model: 5076
Implantable Pulse Generator Implant Date: 20170224
Lead Channel Impedance Value: 323 Ohm
Lead Channel Impedance Value: 342 Ohm
Lead Channel Impedance Value: 380 Ohm
Lead Channel Impedance Value: 456 Ohm
Lead Channel Pacing Threshold Amplitude: 0.875 V
Lead Channel Pacing Threshold Amplitude: 1.125 V
Lead Channel Pacing Threshold Pulse Width: 0.4 ms
Lead Channel Pacing Threshold Pulse Width: 0.4 ms
Lead Channel Sensing Intrinsic Amplitude: 1.75 mV
Lead Channel Sensing Intrinsic Amplitude: 1.75 mV
Lead Channel Sensing Intrinsic Amplitude: 6.25 mV
Lead Channel Sensing Intrinsic Amplitude: 6.25 mV
Lead Channel Setting Pacing Amplitude: 2 V
Lead Channel Setting Pacing Amplitude: 2.5 V
Lead Channel Setting Pacing Pulse Width: 0.4 ms
Lead Channel Setting Sensing Sensitivity: 0.9 mV
Zone Setting Status: 755011
Zone Setting Status: 755011

## 2023-08-27 NOTE — Progress Notes (Signed)
Remote pacemaker transmission.   

## 2023-09-05 ENCOUNTER — Ambulatory Visit
Admission: RE | Admit: 2023-09-05 | Discharge: 2023-09-05 | Disposition: A | Discharge status: Home / Self Care | Payer: Medicare Other | Attending: Nurse Practitioner | Admitting: Nurse Practitioner

## 2023-09-05 ENCOUNTER — Ambulatory Visit (SKILLED_NURSING_FACILITY): Payer: Medicare Other | Admitting: Nurse Practitioner

## 2023-09-05 ENCOUNTER — Ambulatory Visit
Admission: RE | Admit: 2023-09-05 | Discharge: 2023-09-05 | Disposition: A | Discharge status: Home / Self Care | Payer: Medicare Other | Source: Ambulatory Visit | Attending: Nurse Practitioner | Admitting: Nurse Practitioner

## 2023-09-05 ENCOUNTER — Encounter: Payer: Self-pay | Admitting: Nurse Practitioner

## 2023-09-05 VITALS — BP 118/78 | HR 82 | Temp 97.1°F | Ht 71.0 in | Wt 174.0 lb

## 2023-09-05 DIAGNOSIS — R053 Chronic cough: Secondary | ICD-10-CM

## 2023-09-05 DIAGNOSIS — G250 Essential tremor: Secondary | ICD-10-CM

## 2023-09-05 DIAGNOSIS — F419 Anxiety disorder, unspecified: Secondary | ICD-10-CM

## 2023-09-05 DIAGNOSIS — I482 Chronic atrial fibrillation, unspecified: Secondary | ICD-10-CM | POA: Diagnosis not present

## 2023-09-05 DIAGNOSIS — R042 Hemoptysis: Secondary | ICD-10-CM

## 2023-09-05 MED ORDER — SERTRALINE HCL 25 MG PO TABS: 25.0000 mg | ORAL_TABLET | Freq: Every day | ORAL | 1 refills | Status: DC

## 2023-09-05 NOTE — Patient Instructions (Addendum)
To get [facility] [id number] [place],  [place]  [address]  RESTART zoloft at 25 mg daily- new Rx sent to [facility]

## 2023-09-05 NOTE — Progress Notes (Signed)
Careteam: Patient Care Team: Earnestine Mealing, MD as PCP - General (Family Medicine) Antonieta Iba, MD as PCP - Cardiology (Cardiology) Duke Salvia, MD as PCP - Electrophysiology (Cardiology) Antonieta Iba, MD as Consulting Physician (Cardiology) Christene Lye, MD as Referring Physician (Otolaryngology) Sondra Come, MD as Consulting Physician (Urology) Abbie Sons, MD as Referring Physician (Dermatology) [facility], [facility])  Advanced Directive information Does Patient Have a Medical Advance Directive?: Yes, Type of Advance Directive: Living will, Does patient want to make changes to medical advance directive?: No - Patient declined  Allergies  Allergen Reactions   Amoxicillin Rash and Itching   Bactrim [Sulfamethoxazole-Trimethoprim] Rash    Chief Complaint  Patient presents with   Acute Visit    Complains of Coughing up bloody sputum 1-2 times daily for about 5-6 days. Legs Itchy and red. Stopped taking Sertraline because he stated that it messes up his balance.      HPI: Patient is a 87 y.o. male seen in today at the [facility] for coughing up blood.   Reports he saw a pulmonary doctor in the past but does not follow with one.  He is a nonsmoker but growing up his family smoked.  Reports he is short of breath sitting here- comes and goes, has been going on for about a week.  He is on eliquis due to a fib- does not feel like he is having palpitation  No abnormal bruising, no other bleeding  He has been coughing "forever" chronic cough.  Reports he has had bloody sputum about twice a day-- reports sputum is red when it occurs Normally sputum is clear or yellow and during the days it will alternate between clear, bloody and yellow.  No frothy sputum.  Does not feel sick No fatigue, fever or chills   Stopped zoloft 8-9 days ago. He stopped as an experiment  He was taking 100 mg daily  Feels like it was causing his dizziness  and balance issue but when he stopped medication these feelings did not change.  Feels like he is anxious.  Review of Systems:  Review of Systems  Constitutional:  Negative for chills, fever, malaise/fatigue and weight loss.  HENT:  Negative for tinnitus.   Respiratory:  Positive for cough, hemoptysis, sputum production and shortness of breath. Negative for wheezing.   Cardiovascular:  Negative for chest pain, palpitations and leg swelling.  Gastrointestinal:  Negative for abdominal pain, constipation, diarrhea and heartburn.  Genitourinary:  Negative for dysuria, frequency and urgency.  Musculoskeletal:  Negative for back pain, falls, joint pain and myalgias.  Skin: Negative.   Neurological:  Negative for dizziness and headaches.  Psychiatric/Behavioral:  Negative for depression and memory loss. The patient does not have insomnia.     Past Medical History:  Diagnosis Date   Aortic atherosclerosis (HCC)    Aortic root dilation (HCC)    a.) TTE [id number]: Ao root measured 39 mm.   BPH (benign prostatic hyperplasia)    CHF (congestive heart failure) (HCC)    Coronary artery disease    a.) remote PCI of the RI in 1997; stent (unknown type) placed.   Depression    Diverticulosis    Eczema    GERD (gastroesophageal reflux disease)    Hyperlipidemia    Hypertension    Inguinal hernia, left    Long term current use of anticoagulant    a.) apixaban   Nephrolithiasis    NSVT (nonsustained ventricular  tachycardia) (HCC)    Osteoarthrosis    Paroxysmal A-fib (HCC)    a.) CHA2DS2VASc = 5 (age x2, CHF, HTN, vascular disease history);  b.) rate/rhythm maintained on oral metoprolol succinate; chronically anticoagulated with apixaban   Pharyngeal dysphagia    Presence of permanent cardiac pacemaker 2007   a.) Medtronic device placed in [place] in 2007; b.) generator changed at [facility] on 11/18/2015   RBBB    SSS (sick sinus syndrome) (HCC) 2007   a.) s/p PPM placement in [place] in 2007    Past Surgical History:  Procedure Laterality Date   BLEPHAROPLASTY Bilateral    CARDIAC CATHETERIZATION  09/25/1995   CARDIAC CATHETERIZATION  09/24/1998   CHOLECYSTECTOMY     COLONOSCOPY  09/24/2010   CORONARY ANGIOPLASTY  09/25/1995   ramus   ESOPHAGOGASTRODUODENOSCOPY N/A 02/11/2017   Procedure: ESOPHAGOGASTRODUODENOSCOPY (EGD);  Surgeon: Christena Deem, MD;  Location: [facility] ENDOSCOPY;  Service: Endoscopy;  Laterality: N/A;   INGUINAL HERNIA REPAIR Left 1977   INSERT / REPLACE / REMOVE PACEMAKER  12/23/2005   Medtronic [id number] - placed in [place]   INSERTION OF MESH Left 06/08/2022   Procedure: INSERTION OF MESH;  Surgeon: Campbell Lerner, MD;  Location: [facility] ORS;  Service: General;  Laterality: Left;   MANDIBLE SURGERY  1966   PACEMAKER GENERATOR CHANGE N/A 11/18/2015   ROBOTIC ASSISTED LAPAROSCOPIC CHOLECYSTECTOMY  01/12/2022   TONSILLECTOMY     TOTAL HIP ARTHROPLASTY Right 2022   TOTAL SHOULDER REPLACEMENT     bilateral approx 2020 and 2021   Social History:   reports that he has never smoked. He has never used smokeless tobacco. He reports current alcohol use. He reports that he does not use drugs.  Family History  Problem Relation Age of Onset   Heart disease Mother    Hypertension Mother    Hyperlipidemia Mother    Heart attack Father    Heart disease Brother     Medications: Patient's Medications  New Prescriptions   No medications on file  Previous Medications   ACETAMINOPHEN (TYLENOL) 500 MG TABLET    Take 500 mg by mouth every 8 (eight) hours as needed.   ALBUTEROL (VENTOLIN HFA) 108 (90 BASE) MCG/ACT INHALER    Inhale 2 puffs into the lungs every 6 (six) hours as needed for wheezing or shortness of breath.   APIXABAN (ELIQUIS) 5 MG TABS TABLET    Take 1 tablet (5 mg total) by mouth 2 (two) times daily.   BISOPROLOL (ZEBETA) 5 MG TABLET    Take 1 tablet (5 mg total) by mouth daily.   [facility]S ZINC 50 MG TABS    Take 50 mg by mouth daily.   DICLOFENAC  (VOLTAREN) 75 MG EC TABLET    Take 1 tablet (75 mg total) by mouth 2 (two) times daily.   EPINEPHRINE 0.3 MG/0.3 ML IJ SOAJ INJECTION    Inject into the muscle.   FINASTERIDE (PROSCAR) 5 MG TABLET    Take 1 tablet (5 mg total) by mouth daily.   FLUTICASONE (FLONASE) 50 MCG/ACT NASAL SPRAY    Place 2 sprays into both nostrils 2 (two) times daily.   FUROSEMIDE (LASIX) 40 MG TABLET    Take 3 tablets (120 mg total) by mouth daily. 3 tablets daily and may take 4 tablets if needed   LIDOCAINE (LIDODERM) 5 %    Place 1 patch onto the skin daily. Remove & Discard patch within 12 hours or as directed by MD   LOVASTATIN (MEVACOR) 20  MG TABLET    Take 1 tablet (20 mg total) by mouth at bedtime.   NITROGLYCERIN (NITROSTAT) 0.4 MG SL TABLET    Place 1 tablet (0.4 mg total) under the tongue every 5 (five) minutes as needed for chest pain.   POTASSIUM CHLORIDE SA (KLOR-CON M) 20 MEQ TABLET    Take 2 tablets (40 mEq total) by mouth daily. Take with lasix   PREGABALIN (LYRICA) 25 MG CAPSULE    Take 25 mg twice a day for a week then increase to 50 mg twice a day and continue that dose   PRIMIDONE (MYSOLINE) 50 MG TABLET    Take 25 mg at night for a week then increase to 50 mg at night and continue that dose,   SERTRALINE (ZOLOFT) 100 MG TABLET    Take 1 tablet (100 mg total) by mouth daily.   TAMSULOSIN (FLOMAX) 0.4 MG CAPS CAPSULE    Take 2 capsules (0.8 mg total) by mouth daily after breakfast.   TRELEGY ELLIPTA 100-62.5-25 MCG/ACT AEPB    Inhale 1 puff into the lungs daily.  Modified Medications   No medications on file  Discontinued Medications   No medications on file    Physical Exam:  Vitals:   09/05/23 1041  BP: 118/78  Pulse: 82  Temp: (!) 97.1 F (36.2 C)  SpO2: 95%  Weight: 174 lb (78.9 kg)  Height: 5\' 11"  (1.803 m)   Body mass index is 24.27 kg/m. Wt Readings from Last 3 Encounters:  09/05/23 174 lb (78.9 kg)  07/26/23 178 lb 8 oz (81 kg)  07/24/23 170 lb (77.1 kg)    Physical  Exam Constitutional:      General: He is not in acute distress.    Appearance: He is well-developed. He is not diaphoretic.  HENT:     Head: Normocephalic and atraumatic.     Right Ear: External ear normal.     Left Ear: External ear normal.     Mouth/Throat:     Pharynx: No oropharyngeal exudate.  Eyes:     Conjunctiva/sclera: Conjunctivae normal.     Pupils: Pupils are equal, round, and reactive to light.  Cardiovascular:     Rate and Rhythm: Normal rate and regular rhythm.     Heart sounds: Normal heart sounds.  Pulmonary:     Effort: Pulmonary effort is normal.     Breath sounds: Normal breath sounds.  Abdominal:     General: Bowel sounds are normal.     Palpations: Abdomen is soft.  Musculoskeletal:        General: No tenderness.     Cervical back: Normal range of motion and neck supple.     Right lower leg: No edema.     Left lower leg: No edema.  Skin:    General: Skin is warm and dry.  Neurological:     Mental Status: He is alert and oriented to person, place, and time.     Labs reviewed: Basic Metabolic Panel: Recent Labs    11/22/22 0000 12/10/22 0000 04/11/23 1111 04/15/23 0800 06/05/23 1015  NA 140  --  140 142  --   K 3.3* 3.8 3.3* 3.8  --   CL 103  --  107 107  --   CO2 30*  --  26 27  --   GLUCOSE  --   --  114* 100*  --   BUN 18  --  16 18  --   CREATININE 1.0  --  1.02 0.96 1.00  CALCIUM 9.3  --  8.8* 9.1  --    Liver Function Tests: Recent Labs    11/22/22 0000 04/15/23 0800  AST 15 12  ALT 9* 9  ALKPHOS 71  --   BILITOT  --  0.6  PROT  --  6.3  ALBUMIN 4.3  --    No results for input(s): "LIPASE", "AMYLASE" in the last 8760 hours. No results for input(s): "AMMONIA" in the last 8760 hours. CBC: Recent Labs    11/22/22 0000 04/11/23 1111 04/15/23 0800  WBC 5.6 5.4 5.4  NEUTROABS  --   --  3,267  HGB 14.2 14.3 13.8  HCT 42 44.0 41.0  MCV  --  98.7 97.2  PLT 146* 131* 141   Lipid Panel: No results for input(s): "CHOL",  "HDL", "LDLCALC", "TRIG", "CHOLHDL", "LDLDIRECT" in the last 8760 hours. TSH: No results for input(s): "TSH" in the last 8760 hours. A1C: Lab Results  Component Value Date   HGBA1C 5.7 (H) 04/15/2023     Assessment/Plan 1. Hemoptysis  Blood tinged sputum that comes and goes throughout the day  -reports sputum changing from clear, yellow and red No reports of fevers or chills. -some fatigued noted -continues to have chronic cough and reports shortness of breath which also comes and goes (not related to activity)  - DG Chest 2 View; Future - CBC with Differential/Platelet -  2. Anxiety Increase in anxiety after abruptly stopping zoloft 2 weeks ago. Discussed in the future best to wean of these medications -will restart at 25 mg daily and increase to 50 if needed - sertraline (ZOLOFT) 25 MG tablet; Take 1 tablet (25 mg total) by mouth daily.  Dispense: 30 tablet; Refill: 1  3. Essential tremor Was placed on primidone and lyica due to this but reports he is not taking either medication at this time.   4. Chronic atrial fibrillation (HCC) Rate controlled on bisoprolol, on eliquis for anticoagulation   5. Chronic cough - CBC with Differential/Platelet   Strict follow up precautions discussed  Chrishun Scheer K. Janyth Contes, AGNP   Addendum:  Chest xray negative for acute findings Consulted with pulmonary  Will hold eliquis for 3 days due to blood  -start doxycycline 100 mg PO BID for 7 days CT chest to further evaluate cause of hemoptysis  PCP, Dr Sydnee Cabal aware and in agreement of plan.   [facility] [phone]

## 2023-09-06 MED ORDER — DOXYCYCLINE HYCLATE 100 MG PO TABS: 100.0000 mg | ORAL_TABLET | Freq: Two times a day (BID) | ORAL | 0 refills | Status: DC

## 2023-09-09 LAB — CBC WITH DIFFERENTIAL/PLATELET
Absolute Lymphocytes: 1507 {cells}/uL (ref 850–3900)
Absolute Monocytes: 533 {cells}/uL (ref 200–950)
Basophils Absolute: 37 {cells}/uL (ref 0–200)
Basophils Relative: 0.6 %
Eosinophils Absolute: 403 {cells}/uL (ref 15–500)
Eosinophils Relative: 6.5 %
HCT: 45.6 % (ref 38.5–50.0)
Hemoglobin: 15.4 g/dL (ref 13.2–17.1)
MCH: 33.3 pg — ABNORMAL HIGH (ref 27.0–33.0)
MCHC: 33.8 g/dL (ref 32.0–36.0)
MCV: 98.7 fL (ref 80.0–100.0)
MPV: 8.9 fL (ref 7.5–12.5)
Monocytes Relative: 8.6 %
Neutro Abs: 3720 {cells}/uL (ref 1500–7800)
Neutrophils Relative %: 60 %
Platelets: 168 10*3/uL (ref 140–400)
RBC: 4.62 10*6/uL (ref 4.20–5.80)
RDW: 13 % (ref 11.0–15.0)
Total Lymphocyte: 24.3 %
WBC: 6.2 10*3/uL (ref 3.8–10.8)

## 2023-09-12 ENCOUNTER — Ambulatory Visit (INDEPENDENT_AMBULATORY_CARE_PROVIDER_SITE_OTHER): Payer: Medicare Other | Admitting: Podiatry

## 2023-09-12 ENCOUNTER — Encounter: Payer: Self-pay | Admitting: Podiatry

## 2023-09-12 DIAGNOSIS — L84 Corns and callosities: Secondary | ICD-10-CM | POA: Diagnosis not present

## 2023-09-12 DIAGNOSIS — M79675 Pain in left toe(s): Secondary | ICD-10-CM | POA: Diagnosis not present

## 2023-09-12 DIAGNOSIS — I739 Peripheral vascular disease, unspecified: Secondary | ICD-10-CM

## 2023-09-12 DIAGNOSIS — M79674 Pain in right toe(s): Secondary | ICD-10-CM

## 2023-09-12 DIAGNOSIS — B351 Tinea unguium: Secondary | ICD-10-CM

## 2023-09-22 NOTE — Progress Notes (Signed)
°  Subjective:  Patient ID: Kyle Morales, male    DOB: 1935/11/09,  MRN: [id number]  Kyle Morales presents to clinic today for at risk foot care. Patient has h/o PAD and callus(es) right lower extremity and painful thick toenails that are difficult to trim. Painful toenails interfere with ambulation. Aggravating factors include wearing enclosed shoe gear. Pain is relieved with periodic professional debridement. Painful calluses are aggravated when weightbearing with and without shoegear. Pain is relieved with periodic professional debridement.  New problem(s): None.   PCP is Earnestine Mealing, MD.  Allergies  Allergen Reactions   Amoxicillin Rash and Itching   Bactrim [Sulfamethoxazole-Trimethoprim] Rash    Review of Systems: Negative except as noted in the HPI.  Objective: No changes noted in today's physical examination. There were no vitals filed for this visit. Kyle Morales is a pleasant 87 y.o. male WD, WN in NAD. AAO x 3.  Vascular Examination: CFT <3 seconds b/l. DP pulses faintly palpable b/l. PT pulses nonpalpable b/l. Digital hair absent. Skin temperature gradient warm to warm b/l. No pain with calf compression. No ischemia or gangrene. No cyanosis or clubbing noted b/l.    Neurological Examination: Sensation grossly intact b/l with 10 gram monofilament. Vibratory sensation intact b/l.   Dermatological Examination: No open wounds b/l. No interdigital macerations. Toenails 1-5 b/l thick, discolored, elongated with subungual debris and pain on dorsal palpation.  Pedal skin thin, shiny and atrophic b/l LE. Hyperkeratotic lesion(s) submet head 5 right foot.  No erythema, no edema, no drainage, no fluctuance.  Musculoskeletal Examination: Muscle strength 5/5 to all lower extremity muscle groups bilaterally. Hammertoe(s) noted to the 1-5 bilaterally.  Radiographs: None  Last HgA1c:      Latest Ref Rng & Units 04/15/2023    8:00 AM 11/22/2022   12:00 AM  Hemoglobin A1C   Hemoglobin-A1c <5.7 % of total Hgb 5.7  5.7         This result is from an external source.   Assessment/Plan: 1. Pain due to onychomycosis of toenails of both feet   2. Callus   3. Peripheral vascular disease with claudication [facility])     -Patient was evaluated today. All questions/concerns addressed on today's visit. -New felt pad applied to insole of right foot to offload callus. -Patient to continue soft, supportive shoe gear daily. -Toenails 1-5 b/l were debrided in length and girth with sterile nail nippers and dremel without iatrogenic bleeding.  -Callus(es) submet head 5 right foot pared utilizing sterile scalpel blade without complication or incident. Total number debrided =1. -Patient/POA to call should there be question/concern in the interim.   Return in about 3 months (around 12/11/2023).  Freddie Breech, DPM      [facility] LOCATION: [address], [place] [address]                   Office [phone]   [facility] LOCATION: [address] [place], [place] [address] Office [phone]

## 2023-09-27 ENCOUNTER — Other Ambulatory Visit: Payer: Self-pay | Admitting: Nurse Practitioner

## 2023-09-27 DIAGNOSIS — F419 Anxiety disorder, unspecified: Secondary | ICD-10-CM

## 2023-09-30 ENCOUNTER — Ambulatory Visit: Payer: Medicare Other

## 2023-10-18 ENCOUNTER — Ambulatory Visit
Admission: RE | Admit: 2023-10-18 | Discharge: 2023-10-18 | Disposition: A | Discharge status: Home / Self Care | Payer: Medicare Other | Source: Ambulatory Visit | Attending: Nurse Practitioner | Admitting: Nurse Practitioner

## 2023-10-18 DIAGNOSIS — R042 Hemoptysis: Secondary | ICD-10-CM | POA: Diagnosis present

## 2023-10-25 ENCOUNTER — Ambulatory Visit: Payer: Medicare Other | Admitting: Student

## 2023-10-25 ENCOUNTER — Encounter: Payer: Self-pay | Admitting: Student

## 2023-10-25 VITALS — BP 118/78 | HR 81 | Temp 97.9°F | Ht 71.0 in | Wt 172.0 lb

## 2023-10-25 DIAGNOSIS — N401 Enlarged prostate with lower urinary tract symptoms: Secondary | ICD-10-CM

## 2023-10-25 DIAGNOSIS — I5032 Chronic diastolic (congestive) heart failure: Secondary | ICD-10-CM | POA: Diagnosis not present

## 2023-10-25 DIAGNOSIS — E785 Hyperlipidemia, unspecified: Secondary | ICD-10-CM | POA: Diagnosis not present

## 2023-10-25 DIAGNOSIS — R7303 Prediabetes: Secondary | ICD-10-CM | POA: Diagnosis not present

## 2023-10-25 DIAGNOSIS — I482 Chronic atrial fibrillation, unspecified: Secondary | ICD-10-CM

## 2023-10-25 DIAGNOSIS — F321 Major depressive disorder, single episode, moderate: Secondary | ICD-10-CM | POA: Insufficient documentation

## 2023-10-25 DIAGNOSIS — J449 Chronic obstructive pulmonary disease, unspecified: Secondary | ICD-10-CM | POA: Insufficient documentation

## 2023-10-25 MED ORDER — FINASTERIDE 5 MG PO TABS: 5.0000 mg | ORAL_TABLET | Freq: Every day | ORAL | 1 refills | Status: DC

## 2023-10-25 NOTE — Progress Notes (Signed)
Location:  [facility] [place]   Provider: Dr. Earnestine Mealing  Code Status: Full Code Goals of Care:     10/25/2023    3:00 PM  Advanced Directives  Does Patient Have a Medical Advance Directive? Yes  Type of Advance Directive Living will  Does patient want to make changes to medical advance directive? No - Patient declined     Chief Complaint  Patient presents with   Medical Management of Chronic Issues    Medical Management of Chronic Issues. 3 Month follow up   Quality Metric Gaps    To discuss need for Covid.     HPI: Patient is a 88 y.o. male seen today for medical management of chronic diseases.  Discussed the use of AI scribe software for clinical note transcription with the patient, who gave verbal consent to proceed.  History of Present Illness   The patient presents with ongoing cough and urinary symptoms.  The patient has a persistent cough originating from the neck and larynx area. A CT scan was performed last Friday, but results are pending. No chest pain or palpitations are present.  He experiences difficulty initiating urination and has developed a breathing routine to assist. He is taking tamsulosin 0.8 mg, two capsules daily, one in the morning and one in the evening. He also takes penicillin and requires a renewal of this prescription.  He reports improvement in balance since completing physical therapy last month. He uses a cane primarily in the morning and when walking outside but feels stable enough without it for short distances like walking to the mailbox. He has started walking more frequently and occasionally uses exercise machines.  His weight is stable, though he notes a discrepancy between his home scale and the clinic's measurement, attributing the difference to clothing weight. He reports a weight of 164 pounds at home, compared to 172 pounds at the clinic. He confirms adequate protein intake and describes his diet as 'pretty good'.  He has  completed a course of doxycycline and is due for lab work to check kidney function and blood count, as it has been about six months since the last tests. He wants to engage more in activities he enjoys, with the support of his partner, Annamary Carolin.      Past Medical History:  Diagnosis Date   Aortic atherosclerosis (HCC)    Aortic root dilation (HCC)    a.) TTE [id number]: Ao root measured 39 mm.   BPH (benign prostatic hyperplasia)    CHF (congestive heart failure) (HCC)    Coronary artery disease    a.) remote PCI of the RI in 1997; stent (unknown type) placed.   Depression    Diverticulosis    Eczema    GERD (gastroesophageal reflux disease)    Hyperlipidemia    Hypertension    Inguinal hernia, left    Long term current use of anticoagulant    a.) apixaban   Nephrolithiasis    NSVT (nonsustained ventricular tachycardia) (HCC)    Osteoarthrosis    Paroxysmal A-fib (HCC)    a.) CHA2DS2VASc = 5 (age x2, CHF, HTN, vascular disease history);  b.) rate/rhythm maintained on oral metoprolol succinate; chronically anticoagulated with apixaban   Pharyngeal dysphagia    Presence of permanent cardiac pacemaker 2007   a.) Medtronic device placed in [place] in 2007; b.) generator changed at [facility] on 11/18/2015   RBBB    SSS (sick sinus syndrome) (HCC) 2007   a.) s/p PPM placement in [place]  in 2007    Past Surgical History:  Procedure Laterality Date   BLEPHAROPLASTY Bilateral    CARDIAC CATHETERIZATION  09/25/1995   CARDIAC CATHETERIZATION  09/24/1998   CHOLECYSTECTOMY     COLONOSCOPY  09/24/2010   CORONARY ANGIOPLASTY  09/25/1995   ramus   ESOPHAGOGASTRODUODENOSCOPY N/A 02/11/2017   Procedure: ESOPHAGOGASTRODUODENOSCOPY (EGD);  Surgeon: Christena Deem, MD;  Location: [facility] ENDOSCOPY;  Service: Endoscopy;  Laterality: N/A;   INGUINAL HERNIA REPAIR Left 1977   INSERT / REPLACE / REMOVE PACEMAKER  12/23/2005   Medtronic [id number] - placed in [place]   INSERTION OF MESH Left  06/08/2022   Procedure: INSERTION OF MESH;  Surgeon: Campbell Lerner, MD;  Location: [facility] ORS;  Service: General;  Laterality: Left;   MANDIBLE SURGERY  1966   PACEMAKER GENERATOR CHANGE N/A 11/18/2015   ROBOTIC ASSISTED LAPAROSCOPIC CHOLECYSTECTOMY  01/12/2022   TONSILLECTOMY     TOTAL HIP ARTHROPLASTY Right 2022   TOTAL SHOULDER REPLACEMENT     bilateral approx 2020 and 2021    Allergies  Allergen Reactions   Amoxicillin Rash and Itching   Bactrim [Sulfamethoxazole-Trimethoprim] Rash    Outpatient Encounter Medications as of 10/25/2023  Medication Sig   acetaminophen (TYLENOL) 500 MG tablet Take 500 mg by mouth every 8 (eight) hours as needed.   albuterol (VENTOLIN HFA) 108 (90 Base) MCG/ACT inhaler Inhale 2 puffs into the lungs every 6 (six) hours as needed for wheezing or shortness of breath.   apixaban (ELIQUIS) 5 MG TABS tablet Take 1 tablet (5 mg total) by mouth 2 (two) times daily.   bisoprolol (ZEBETA) 5 MG tablet Take 1 tablet (5 mg total) by mouth daily.   [employer] ZINC 50 MG TABS Take 50 mg by mouth daily.   doxycycline (VIBRA-TABS) 100 MG tablet Take 1 tablet (100 mg total) by mouth 2 (two) times daily.   finasteride (PROSCAR) 5 MG tablet Take 1 tablet (5 mg total) by mouth daily.   fluticasone (FLONASE) 50 MCG/ACT nasal spray Place 2 sprays into both nostrils 2 (two) times daily.   furosemide (LASIX) 40 MG tablet Take 3 tablets (120 mg total) by mouth daily. 3 tablets daily and may take 4 tablets if needed   lidocaine (LIDODERM) 5 % Place 1 patch onto the skin daily. Remove & Discard patch within 12 hours or as directed by MD   lovastatin (MEVACOR) 20 MG tablet Take 1 tablet (20 mg total) by mouth at bedtime.   nitroGLYCERIN (NITROSTAT) 0.4 MG SL tablet Place 1 tablet (0.4 mg total) under the tongue every 5 (five) minutes as needed for chest pain.   potassium chloride SA (KLOR-CON M) 20 MEQ tablet Take 2 tablets (40 mEq total) by mouth daily. Take with lasix   sertraline  (ZOLOFT) 25 MG tablet TAKE 1 TABLET (25 MG TOTAL) BY MOUTH DAILY.   tamsulosin (FLOMAX) 0.4 MG CAPS capsule Take 2 capsules (0.8 mg total) by mouth daily after breakfast.   TRELEGY ELLIPTA 100-62.5-25 MCG/ACT AEPB Inhale 1 puff into the lungs daily.   No facility-administered encounter medications on file as of 10/25/2023.    Review of Systems:  Review of Systems  Health Maintenance  Topic Date Due   COVID-19 Vaccine (10 - 2024-25 season) 08/16/2023   Medicare Annual Wellness (AWV)  12/20/2023   DTaP/Tdap/Td (2 - Td or Tdap) 02/28/2033   Pneumonia Vaccine 79+ Years old  Completed   INFLUENZA VACCINE  Completed   Zoster Vaccines- Shingrix  Completed   HPV VACCINES  Aged Out    Physical Exam: Vitals:   10/25/23 1458  BP: 118/78  Pulse: 81  Temp: 97.9 F (36.6 C)  SpO2: 96%  Weight: 172 lb (78 kg)  Height: 5\' 11"  (1.803 m)   Body mass index is 23.99 kg/m. Physical Exam pulmonary embolism   Labs reviewed: Basic Metabolic Panel: Recent Labs    11/22/22 0000 12/10/22 0000 04/11/23 1111 04/15/23 0800 06/05/23 1015  NA 140  --  140 142  --   K 3.3* 3.8 3.3* 3.8  --   CL 103  --  107 107  --   CO2 30*  --  26 27  --   GLUCOSE  --   --  114* 100*  --   BUN 18  --  16 18  --   CREATININE 1.0  --  1.02 0.96 1.00  CALCIUM 9.3  --  8.8* 9.1  --    Liver Function Tests: Recent Labs    11/22/22 0000 04/15/23 0800  AST 15 12  ALT 9* 9  ALKPHOS 71  --   BILITOT  --  0.6  PROT  --  6.3  ALBUMIN 4.3  --    No results for input(s): "LIPASE", "AMYLASE" in the last 8760 hours. No results for input(s): "AMMONIA" in the last 8760 hours. CBC: Recent Labs    04/11/23 1111 04/15/23 0800 09/09/23 0738  WBC 5.4 5.4 6.2  NEUTROABS  --  3,267 3,720  HGB 14.3 13.8 15.4  HCT 44.0 41.0 45.6  MCV 98.7 97.2 98.7  PLT 131* 141 168   Lipid Panel: No results for input(s): "CHOL", "HDL", "LDLCALC", "TRIG", "CHOLHDL", "LDLDIRECT" in the last 8760 hours. Lab Results   Component Value Date   HGBA1C 5.7 (H) 04/15/2023    Procedures since last visit: No results found.  Assessment and Plan    Chronic Cough Persistent cough localized around the larynx. No chest pain or palpitations. Awaiting CT scan results for further evaluation, which will guide further management. - Review CT scan results once available  Urinary Hesitancy Difficulty initiating urination. Managed with tamsulosin 0.8 mg daily (two capsules). Developed a breathing routine to assist with urination. - Continue tamsulosin 0.8 mg daily  Balance Issues Balance improved with physical therapy, which ended last month. Uses a cane in the morning and when walking outside but feels stable without it for short distances. Encouraged to continue balance exercises and regular walking to maintain muscle strength. - Encourage continuation of balance exercises - Encourage regular walking for muscle maintenance  Dry Skin Bumpy skin texture, likely due to dryness, especially in winter. Currently using Eucerin but not daily. Advised to use moisturizer daily to improve skin condition. - Recommend daily use of Eucerin moisturizer  General Health Maintenance Weight stable at 172 lbs, though reports 164 lbs at home. Diet includes adequate protein intake. Completed doxycycline course. Due for labs to check kidney function and blood count. Scheduled lab appointment for Monday. - Order labs for kidney function and blood count - Schedule lab appointment for Monday  Medication Management Requires renewal of penicillin prescription. - Renew penicillin prescription.        Labs/tests ordered:  * No order type specified * Next appt:  12/31/2023

## 2023-10-25 NOTE — Patient Instructions (Addendum)
VISIT SUMMARY:  During today's visit, we discussed your ongoing cough, urinary symptoms, balance improvement, and dry skin. We also reviewed your general health maintenance and medication needs. Your partner, [place], accompanied you to the appointment.  YOUR PLAN:  -CHRONIC COUGH: You have a persistent cough around your larynx. We are waiting for the results of your recent CT scan to guide further treatment. A CT scan is an imaging test that helps [place] see detailed pictures of your chest and neck area.  -URINARY HESITANCY: You are having difficulty starting urination. This condition is called urinary hesitancy. You are currently taking tamsulosin 0.8 mg twice daily, which helps relax the muscles in your prostate and bladder to improve urine flow. Continue with this medication as prescribed.  -BALANCE ISSUES: Your balance has improved since completing physical therapy. You use a cane in the morning and when walking outside but feel stable for short distances without it. Balance issues can increase the risk of falls, so continue your balance exercises and regular walking to maintain muscle strength.  -DRY SKIN: You have dry, bumpy skin, especially in the winter. This is likely due to dryness. You are using Eucerin moisturizer but not daily. Dry skin can be uncomfortable, so I recommend using the moisturizer daily to improve your skin condition.  -GENERAL HEALTH MAINTENANCE: Your weight is stable, and your diet includes adequate protein. You have completed your course of doxycycline. It is time for your routine lab tests to check your kidney function and blood count. These tests help [place] monitor your overall health.  -MEDICATION MANAGEMENT: You need a renewal of your penicillin prescription. Penicillin is an antibiotic used to treat infections. I have renewed your prescription.  INSTRUCTIONS:  Please continue taking your medications as prescribed. We will review the results of your CT scan once they  are available. Your lab appointment is scheduled for Monday to check your kidney function and blood count. Continue with your balance exercises and regular walking. Use Eucerin moisturizer daily for your dry skin.

## 2023-10-29 ENCOUNTER — Ambulatory Visit (INDEPENDENT_AMBULATORY_CARE_PROVIDER_SITE_OTHER): Payer: Medicare Other

## 2023-10-29 DIAGNOSIS — I495 Sick sinus syndrome: Secondary | ICD-10-CM | POA: Diagnosis not present

## 2023-10-31 LAB — CUP PACEART REMOTE DEVICE CHECK
Battery Remaining Longevity: 23 mo
Battery Voltage: 2.93 V
Brady Statistic AP VP Percent: 0.47 %
Brady Statistic AP VS Percent: 96.98 %
Brady Statistic AS VP Percent: 0.12 %
Brady Statistic AS VS Percent: 2.43 %
Brady Statistic RA Percent Paced: 96.88 %
Brady Statistic RV Percent Paced: 0.63 %
Date Time Interrogation Session: 20250205211332
Implantable Lead Connection Status: 753985
Implantable Lead Connection Status: 753985
Implantable Lead Implant Date: 20070411
Implantable Lead Implant Date: 20070411
Implantable Lead Location: 753859
Implantable Lead Location: 753860
Implantable Lead Model: 5076
Implantable Lead Model: 5076
Implantable Pulse Generator Implant Date: 20170224
Lead Channel Impedance Value: 342 Ohm
Lead Channel Impedance Value: 361 Ohm
Lead Channel Impedance Value: 399 Ohm
Lead Channel Impedance Value: 456 Ohm
Lead Channel Pacing Threshold Amplitude: 0.875 V
Lead Channel Pacing Threshold Amplitude: 1.25 V
Lead Channel Pacing Threshold Pulse Width: 0.4 ms
Lead Channel Pacing Threshold Pulse Width: 0.4 ms
Lead Channel Sensing Intrinsic Amplitude: 1.25 mV
Lead Channel Sensing Intrinsic Amplitude: 1.25 mV
Lead Channel Sensing Intrinsic Amplitude: 6.25 mV
Lead Channel Sensing Intrinsic Amplitude: 6.25 mV
Lead Channel Setting Pacing Amplitude: 2 V
Lead Channel Setting Pacing Amplitude: 2.75 V
Lead Channel Setting Pacing Pulse Width: 0.4 ms
Lead Channel Setting Sensing Sensitivity: 0.9 mV
Zone Setting Status: 755011
Zone Setting Status: 755011

## 2023-11-01 LAB — CBC WITH DIFFERENTIAL/PLATELET
Absolute Lymphocytes: 1367 {cells}/uL (ref 850–3900)
Absolute Monocytes: 535 {cells}/uL (ref 200–950)
Basophils Absolute: 32 {cells}/uL (ref 0–200)
Basophils Relative: 0.6 %
Eosinophils Absolute: 387 {cells}/uL (ref 15–500)
Eosinophils Relative: 7.3 %
HCT: 46.5 % (ref 38.5–50.0)
Hemoglobin: 15.5 g/dL (ref 13.2–17.1)
MCH: 33.3 pg — ABNORMAL HIGH (ref 27.0–33.0)
MCHC: 33.3 g/dL (ref 32.0–36.0)
MCV: 99.8 fL (ref 80.0–100.0)
MPV: 8.9 fL (ref 7.5–12.5)
Monocytes Relative: 10.1 %
Neutro Abs: 2979 {cells}/uL (ref 1500–7800)
Neutrophils Relative %: 56.2 %
Platelets: 158 10*3/uL (ref 140–400)
RBC: 4.66 10*6/uL (ref 4.20–5.80)
RDW: 12.8 % (ref 11.0–15.0)
Total Lymphocyte: 25.8 %
WBC: 5.3 10*3/uL (ref 3.8–10.8)

## 2023-11-01 LAB — HEMOGLOBIN A1C
Hgb A1c MFr Bld: 5.8 %{Hb} — ABNORMAL HIGH (ref <5.7)
Mean Plasma Glucose: 120 mg/dL
eAG (mmol/L): 6.6 mmol/L

## 2023-11-01 LAB — COMPLETE METABOLIC PANEL WITH GFR
AG Ratio: 1.9 (calc) (ref 1.0–2.5)
ALT: 12 U/L (ref 9–46)
AST: 15 U/L (ref 10–35)
Albumin: 4.2 g/dL (ref 3.6–5.1)
Alkaline phosphatase (APISO): 56 U/L (ref 35–144)
BUN: 12 mg/dL (ref 7–25)
CO2: 30 mmol/L (ref 20–32)
Calcium: 8.9 mg/dL (ref 8.6–10.3)
Chloride: 105 mmol/L (ref 98–110)
Creat: 0.88 mg/dL (ref 0.70–1.22)
Globulin: 2.2 g/dL (ref 1.9–3.7)
Glucose, Bld: 101 mg/dL — ABNORMAL HIGH (ref 65–99)
Potassium: 3.7 mmol/L (ref 3.5–5.3)
Sodium: 143 mmol/L (ref 135–146)
Total Bilirubin: 0.7 mg/dL (ref 0.2–1.2)
Total Protein: 6.4 g/dL (ref 6.1–8.1)
eGFR: 84 mL/min/{1.73_m2} (ref >=60)

## 2023-11-21 ENCOUNTER — Encounter: Payer: Self-pay | Admitting: Podiatry

## 2023-11-21 ENCOUNTER — Ambulatory Visit (INDEPENDENT_AMBULATORY_CARE_PROVIDER_SITE_OTHER): Payer: Medicare Other | Admitting: Podiatry

## 2023-11-21 DIAGNOSIS — L84 Corns and callosities: Secondary | ICD-10-CM

## 2023-11-21 DIAGNOSIS — B351 Tinea unguium: Secondary | ICD-10-CM

## 2023-11-21 DIAGNOSIS — M79675 Pain in left toe(s): Secondary | ICD-10-CM

## 2023-11-21 DIAGNOSIS — I739 Peripheral vascular disease, unspecified: Secondary | ICD-10-CM

## 2023-11-21 DIAGNOSIS — M79674 Pain in right toe(s): Secondary | ICD-10-CM | POA: Diagnosis not present

## 2023-11-24 NOTE — Progress Notes (Signed)
  Subjective:  Patient ID: Kyle Morales, male    DOB: 1936-01-13,  MRN: [id number]  Kyle Morales presents to clinic today for at risk foot care. Patient has h/o PAD and callus(es) submet head 5 right foot and painful mycotic toenails that are difficult to trim. Painful toenails interfere with ambulation. Aggravating factors include wearing enclosed shoe gear. Pain is relieved with periodic professional debridement. Painful calluses are aggravated when weightbearing with and without shoegear. Pain is relieved with periodic professional debridement.   New problem(s): None.   PCP is Earnestine Mealing, MD.  Allergies  Allergen Reactions   Amoxicillin Rash and Itching   Bactrim [Sulfamethoxazole-Trimethoprim] Rash    Review of Systems: Negative except as noted in the HPI.  Objective: No changes noted in today's physical examination. There were no vitals filed for this visit. Kyle Morales is a pleasant 88 y.o. male WD, WN in NAD. AAO x 3.  Vascular Examination: CFT <3 seconds b/l. DP pulses faintly palpable b/l. PT pulses nonpalpable b/l. Digital hair absent. Skin temperature gradient warm to warm b/l. No pain with calf compression. No ischemia or gangrene. No cyanosis or clubbing noted b/l.    Neurological Examination: Sensation grossly intact b/l with 10 gram monofilament. Vibratory sensation intact b/l.   Dermatological Examination: No open wounds b/l. No interdigital macerations. Toenails 1-5 b/l thick, discolored, elongated with subungual debris and pain on dorsal palpation.  Pedal skin thin, shiny and atrophic b/l LE. Hyperkeratotic lesion(s) submet head 5 right foot.  No erythema, no edema, no drainage, no fluctuance.  Musculoskeletal Examination: Muscle strength 5/5 to all lower extremity muscle groups bilaterally. Hammertoe deformity noted 1-5 b/l.  Radiographs: None  Last HgA1c:      Latest Ref Rng & Units 10/31/2023    7:50 AM 04/15/2023    8:00 AM  Hemoglobin A1C   Hemoglobin-A1c <5.7 % of total Hgb 5.8  5.7    Assessment/Plan: 1. Pain due to onychomycosis of toenails of both feet   2. Callus   3. Peripheral vascular disease with claudication (HCC)     -Consent given for treatment as described below: -Examined patient. -Mycotic toenails 1-5 bilaterally were debrided in length and girth with sterile nail nippers and dremel without incident. -Callus(es) submet head 5 right foot pared utilizing sterile scalpel blade without complication or incident. Total number debrided =1. -Patient/POA to call should there be question/concern in the interim.   Return in about 3 months (around 02/18/2024).  Freddie Breech, DPM      [place] LOCATION: [address] [place] [address]                   Office [phone]   [facility] LOCATION: [address] [place], [place] [address] Office [phone]

## 2023-12-05 NOTE — Progress Notes (Signed)
 Remote pacemaker transmission.

## 2023-12-05 NOTE — Addendum Note (Signed)
 Addended by: Geralyn Flash D on: 12/05/2023 02:44 PM   Modules accepted: Orders

## 2023-12-12 ENCOUNTER — Ambulatory Visit: Admitting: Nurse Practitioner

## 2023-12-12 ENCOUNTER — Encounter: Payer: Self-pay | Admitting: Nurse Practitioner

## 2023-12-12 ENCOUNTER — Telehealth: Payer: Self-pay | Admitting: Cardiovascular Disease

## 2023-12-12 VITALS — BP 122/68 | HR 80 | Temp 97.0°F | Ht 71.0 in | Wt 168.0 lb

## 2023-12-12 DIAGNOSIS — J40 Bronchitis, not specified as acute or chronic: Secondary | ICD-10-CM

## 2023-12-12 MED ORDER — DOXYCYCLINE HYCLATE 100 MG PO TABS: 100.0000 mg | ORAL_TABLET | Freq: Two times a day (BID) | ORAL | 0 refills | Status: DC

## 2023-12-12 NOTE — Patient Instructions (Signed)
 Netipot or saline wash daily Plain nasal saline spray throughout the day as needed May use tylenol 325 mg 2 tablets every 6-8 hours as needed aches and pains or sore throat humidifier in the home to help with the dry air Mucinex DM by mouth twice daily as needed for cough and chest congestion with full glass of water  Keep well hydrated Proper nutrition  Avoid forcefully blowing nose Vit c 1000 mg daily Vit d 2000 units daily  Doxycycline 100 mg by mouth twice daily for 7 days- take with food.

## 2023-12-12 NOTE — Progress Notes (Signed)
 Careteam: Patient Care Team: Earnestine Mealing, MD as PCP - General (Family Medicine) Antonieta Iba, MD as PCP - Cardiology (Cardiology) Duke Salvia, MD as PCP - Electrophysiology (Cardiology) Antonieta Iba, MD as Consulting Physician (Cardiology) Christene Lye, MD as Referring Physician (Otolaryngology) Sondra Come, MD as Consulting Physician (Urology) Abbie Sons, MD as Referring Physician (Dermatology) Pa, [facility]) Raechel Chute, MD as Consulting Physician (Pulmonary Disease) PLACE OF SERVICE:  [facility]   Advanced Directive information    Allergies  Allergen Reactions   Amoxicillin Rash and Itching   Bactrim [Sulfamethoxazole-Trimethoprim] Rash    Chief Complaint  Patient presents with   URI    Cold Symptoms with Cough for 4-5 days, No fever, Congestion.  Flu A, Flu B and Covid Test performed in Office. Test Results: Negative.      Discussed the use of AI scribe software for clinical note transcription with the patient, who gave verbal consent to proceed.  History of Present Illness   Kyle Morales is an 88 year old male with COPD who presents with cough and chest congestion.  He has a productive cough and chest congestion, accompanied by shortness of breath, especially when walking more than a short distance. No fever, chills, sore throat, ear pain, or dizziness. He experiences fatigue, which he notes is a constant issue, but attributes increased tiredness to his current symptoms.  He describes congestion in his chest and head, with a headache located behind his eyes and sinus pain. He has a history of sinus polyps, which were removed last year. He uses nasal washes once or twice a week.  He has a history of COPD and uses Trelegy and albuterol. He uses albuterol as needed, particularly when experiencing shortness of breath or wheezing, which he notices more at night. No history of smoking, although his family  members smoked.  He is currently taking Tylenol, Trelegy, and albuterol. He has not seen a pulmonologist recently but recalls seeing one in 2024.         Review of Systems:  Review of Systems  Constitutional:  Positive for malaise/fatigue. Negative for chills and fever.  HENT:  Positive for congestion and sinus pain. Negative for sore throat.   Respiratory:  Positive for cough, sputum production and shortness of breath (with increase in activity).   Cardiovascular:  Negative for chest pain and palpitations.  Neurological:  Positive for headaches. Negative for dizziness.   Past Medical History:  Diagnosis Date   Aortic atherosclerosis (HCC)    Aortic root dilation (HCC)    a.) TTE [id number]: Ao root measured 39 mm.   BPH (benign prostatic hyperplasia)    CHF (congestive heart failure) (HCC)    Coronary artery disease    a.) remote PCI of the RI in 1997; stent (unknown type) placed.   Depression    Diverticulosis    Eczema    GERD (gastroesophageal reflux disease)    Hyperlipidemia    Hypertension    Inguinal hernia, left    Long term current use of anticoagulant    a.) apixaban   Nephrolithiasis    NSVT (nonsustained ventricular tachycardia) (HCC)    Osteoarthrosis    Paroxysmal A-fib (HCC)    a.) CHA2DS2VASc = 5 (age x2, CHF, HTN, vascular disease history);  b.) rate/rhythm maintained on oral metoprolol succinate; chronically anticoagulated with apixaban   Pharyngeal dysphagia    Presence of permanent cardiac pacemaker 2007   a.) Medtronic  device placed in [place] in 2007; b.) generator changed at [facility] on 11/18/2015   RBBB    SSS (sick sinus syndrome) (HCC) 2007   a.) s/p PPM placement in [place] in 2007   Past Surgical History:  Procedure Laterality Date   BLEPHAROPLASTY Bilateral    CARDIAC CATHETERIZATION  09/25/1995   CARDIAC CATHETERIZATION  09/24/1998   CHOLECYSTECTOMY     COLONOSCOPY  09/24/2010   CORONARY ANGIOPLASTY  09/25/1995   ramus    ESOPHAGOGASTRODUODENOSCOPY N/A 02/11/2017   Procedure: ESOPHAGOGASTRODUODENOSCOPY (EGD);  Surgeon: Christena Deem, MD;  Location: [facility] ENDOSCOPY;  Service: Endoscopy;  Laterality: N/A;   INGUINAL HERNIA REPAIR Left 1977   INSERT / REPLACE / REMOVE PACEMAKER  12/23/2005   Medtronic [id number] - placed in [place]   INSERTION OF MESH Left 06/08/2022   Procedure: INSERTION OF MESH;  Surgeon: Campbell Lerner, MD;  Location: [facility] ORS;  Service: General;  Laterality: Left;   MANDIBLE SURGERY  1966   PACEMAKER GENERATOR CHANGE N/A 11/18/2015   ROBOTIC ASSISTED LAPAROSCOPIC CHOLECYSTECTOMY  01/12/2022   TONSILLECTOMY     TOTAL HIP ARTHROPLASTY Right 2022   TOTAL SHOULDER REPLACEMENT     bilateral approx 2020 and 2021   Social History:   reports that he has never smoked. He has never used smokeless tobacco. He reports current alcohol use. He reports that he does not use drugs.  Family History  Problem Relation Age of Onset   Heart disease Mother    Hypertension Mother    Hyperlipidemia Mother    Heart attack Father    Heart disease Brother     Medications: Patient's Medications  New Prescriptions   DOXYCYCLINE (VIBRA-TABS) 100 MG TABLET    Take 1 tablet (100 mg total) by mouth 2 (two) times daily.  Previous Medications   ACETAMINOPHEN (TYLENOL) 500 MG TABLET    Take 500 mg by mouth every 8 (eight) hours as needed.   ALBUTEROL (VENTOLIN HFA) 108 (90 BASE) MCG/ACT INHALER    Inhale 2 puffs into the lungs every 6 (six) hours as needed for wheezing or shortness of breath.   APIXABAN (ELIQUIS) 5 MG TABS TABLET    Take 1 tablet (5 mg total) by mouth 2 (two) times daily.   BISOPROLOL (ZEBETA) 5 MG TABLET    Take 1 tablet (5 mg total) by mouth daily.   [employer]S ZINC 50 MG TABS    Take 50 mg by mouth daily.   FINASTERIDE (PROSCAR) 5 MG TABLET    Take 1 tablet (5 mg total) by mouth daily.   FLUTICASONE (FLONASE) 50 MCG/ACT NASAL SPRAY    Place 2 sprays into both nostrils 2 (two) times daily.    FUROSEMIDE (LASIX) 40 MG TABLET    Take 3 tablets (120 mg total) by mouth daily. 3 tablets daily and may take 4 tablets if needed   LIDOCAINE (LIDODERM) 5 %    Place 1 patch onto the skin daily. Remove & Discard patch within 12 hours or as directed by MD   LOVASTATIN (MEVACOR) 20 MG TABLET    Take 1 tablet (20 mg total) by mouth at bedtime.   NITROGLYCERIN (NITROSTAT) 0.4 MG SL TABLET    Place 1 tablet (0.4 mg total) under the tongue every 5 (five) minutes as needed for chest pain.   POTASSIUM CHLORIDE SA (KLOR-CON M) 20 MEQ TABLET    Take 2 tablets (40 mEq total) by mouth daily. Take with lasix   SERTRALINE (ZOLOFT) 25 MG TABLET  TAKE 1 TABLET (25 MG TOTAL) BY MOUTH DAILY.   TAMSULOSIN (FLOMAX) 0.4 MG CAPS CAPSULE    Take 2 capsules (0.8 mg total) by mouth daily after breakfast.   TRELEGY ELLIPTA 100-62.5-25 MCG/ACT AEPB    Inhale 1 puff into the lungs daily.  Modified Medications   No medications on file  Discontinued Medications   No medications on file    Physical Exam:  Vitals:   12/12/23 0901  BP: 122/68  Pulse: 80  Temp: (!) 97 F (36.1 C)  SpO2: 93%  Weight: 168 lb (76.2 kg)  Height: 5\' 11"  (1.803 m)   Body mass index is 23.43 kg/m. Wt Readings from Last 3 Encounters:  12/12/23 168 lb (76.2 kg)  10/25/23 172 lb (78 kg)  09/05/23 174 lb (78.9 kg)    Physical Exam Constitutional:      General: He is not in acute distress.    Appearance: He is well-developed. He is not diaphoretic.  HENT:     Head: Normocephalic and atraumatic.     Right Ear: External ear normal.     Left Ear: External ear normal.     Mouth/Throat:     Pharynx: No oropharyngeal exudate.  Eyes:     Conjunctiva/sclera: Conjunctivae normal.     Pupils: Pupils are equal, round, and reactive to light.  Cardiovascular:     Rate and Rhythm: Normal rate and regular rhythm.     Heart sounds: Normal heart sounds.  Pulmonary:     Effort: Pulmonary effort is normal.     Breath sounds: Normal breath  sounds.  Abdominal:     General: Bowel sounds are normal.     Palpations: Abdomen is soft.  Musculoskeletal:        General: No tenderness.     Cervical back: Normal range of motion and neck supple.     Right lower leg: No edema.     Left lower leg: No edema.  Skin:    General: Skin is warm and dry.  Neurological:     Mental Status: He is alert and oriented to person, place, and time.     Labs reviewed: Basic Metabolic Panel: Recent Labs    04/11/23 1111 04/15/23 0800 06/05/23 1015 10/31/23 0750  NA 140 142  --  143  K 3.3* 3.8  --  3.7  CL 107 107  --  105  CO2 26 27  --  30  GLUCOSE 114* 100*  --  101*  BUN 16 18  --  12  CREATININE 1.02 0.96 1.00 0.88  CALCIUM 8.8* 9.1  --  8.9   Liver Function Tests: Recent Labs    04/15/23 0800 10/31/23 0750  AST 12 15  ALT 9 12  BILITOT 0.6 0.7  PROT 6.3 6.4   No results for input(s): "LIPASE", "AMYLASE" in the last 8760 hours. No results for input(s): "AMMONIA" in the last 8760 hours. CBC: Recent Labs    04/15/23 0800 09/09/23 0738 10/31/23 0750  WBC 5.4 6.2 5.3  NEUTROABS 3,267 3,720 2,979  HGB 13.8 15.4 15.5  HCT 41.0 45.6 46.5  MCV 97.2 98.7 99.8  PLT 141 168 158   Lipid Panel: No results for input(s): "CHOL", "HDL", "LDLCALC", "TRIG", "CHOLHDL", "LDLDIRECT" in the last 8760 hours. TSH: No results for input(s): "TSH" in the last 8760 hours. A1C: Lab Results  Component Value Date   HGBA1C 5.8 (H) 10/31/2023     Assessment/Plan Assessment and Plan    Acute Bronchitis Cough,  chest congestion, and shortness of breath consistent with acute bronchitis. Symptoms worsen with activity. COPD present, no smoking history. - Prescribed doxycycline 100 mg orally twice daily for 7 days. Instructed to take with food. - Recommended Mucinex DM with water twice daily. Advised 12-hour tablet over the counter. - Advised continued use of Trelegy and albuterol as needed. - Instructed to perform nasal washes daily. -  Advised humidifier use if experiencing dryness. - Instructed to monitor for fever, increased shortness of breath, or worsening symptoms and contact clinic if these occur.  Chronic Obstructive Pulmonary Disease (COPD) COPD managed with Trelegy and albuterol. No smoking history, family history of smoking. - Continue Trelegy as prescribed. - Use albuterol as needed.      Janene Harvey. Biagio Borg  [facility] [phone]

## 2023-12-12 NOTE — Telephone Encounter (Signed)
 Pt wife stated that they do no use mychart and would like the remote appt letters to be mailed out to them.

## 2023-12-13 NOTE — Telephone Encounter (Signed)
 I put a note in his appointment notes for [place] to make sure we send him a letter.

## 2023-12-31 ENCOUNTER — Encounter: Payer: Medicare Other | Admitting: Nurse Practitioner

## 2024-01-02 ENCOUNTER — Encounter: Payer: Self-pay | Admitting: Nurse Practitioner

## 2024-01-02 ENCOUNTER — Ambulatory Visit: Payer: Medicare Other | Admitting: Nurse Practitioner

## 2024-01-02 VITALS — BP 118/80 | HR 72 | Temp 97.8°F | Resp 20

## 2024-01-02 DIAGNOSIS — Z0000 Encounter for general adult medical examination without abnormal findings: Secondary | ICD-10-CM | POA: Diagnosis not present

## 2024-01-02 MED ORDER — NITROGLYCERIN 0.4 MG SL SUBL: 0.4000 mg | SUBLINGUAL_TABLET | SUBLINGUAL | 1 refills | Status: AC | PRN

## 2024-01-02 MED ORDER — BISOPROLOL FUMARATE 5 MG PO TABS: 5.0000 mg | ORAL_TABLET | Freq: Every day | ORAL | 3 refills | Status: AC

## 2024-01-02 NOTE — Progress Notes (Signed)
 Subjective:   Kyle Morales is a 88 y.o. male who presents for Medicare Annual/Subsequent preventive examination.  Visit Complete: In person [place]   Cardiac Risk Factors include: advanced age (>38men, >25 women);hypertension;sedentary lifestyle     Objective:    There were no vitals filed for this visit. There is no height or weight on file to calculate BMI.     01/02/2024    9:08 AM 10/25/2023    3:00 PM 09/05/2023   10:45 AM 07/24/2023    2:44 PM 05/22/2023    2:19 PM 05/15/2023    2:13 PM 05/09/2023    8:09 AM  Advanced Directives  Does Patient Have a Medical Advance Directive? Yes Yes Yes Yes Yes Yes Yes  Type of Advance Directive  Living will Living will Living will Living will Living will Living will  Does patient want to make changes to medical advance directive?  No - Patient declined No - Patient declined No - Patient declined No - Patient declined No - Patient declined No - Patient declined    Current Medications (verified) Outpatient Encounter Medications as of 01/02/2024  Medication Sig   acetaminophen (TYLENOL) 500 MG tablet Take 500 mg by mouth every 8 (eight) hours as needed.   albuterol (VENTOLIN HFA) 108 (90 Base) MCG/ACT inhaler Inhale 2 puffs into the lungs every 6 (six) hours as needed for wheezing or shortness of breath.   apixaban (ELIQUIS) 5 MG TABS tablet Take 1 tablet (5 mg total) by mouth 2 (two) times daily.   bisoprolol (ZEBETA) 5 MG tablet Take 1 tablet (5 mg total) by mouth daily.   CVS ZINC 50 MG TABS Take 50 mg by mouth daily.   doxycycline (VIBRA-TABS) 100 MG tablet Take 1 tablet (100 mg total) by mouth 2 (two) times daily.   finasteride (PROSCAR) 5 MG tablet Take 1 tablet (5 mg total) by mouth daily.   fluticasone (FLONASE) 50 MCG/ACT nasal spray Place 2 sprays into both nostrils 2 (two) times daily.   furosemide (LASIX) 40 MG tablet Take 3 tablets (120 mg total) by mouth daily. 3 tablets daily and may take 4 tablets if needed   lidocaine  (LIDODERM) 5 % Place 1 patch onto the skin daily. Remove & Discard patch within 12 hours or as directed by MD   lovastatin (MEVACOR) 20 MG tablet Take 1 tablet (20 mg total) by mouth at bedtime.   nitroGLYCERIN (NITROSTAT) 0.4 MG SL tablet Place 1 tablet (0.4 mg total) under the tongue every 5 (five) minutes as needed for chest pain.   potassium chloride SA (KLOR-CON M) 20 MEQ tablet Take 2 tablets (40 mEq total) by mouth daily. Take with lasix   sertraline (ZOLOFT) 25 MG tablet TAKE 1 TABLET (25 MG TOTAL) BY MOUTH DAILY.   tamsulosin (FLOMAX) 0.4 MG CAPS capsule Take 2 capsules (0.8 mg total) by mouth daily after breakfast.   TRELEGY ELLIPTA 100-62.5-25 MCG/ACT AEPB Inhale 1 puff into the lungs daily.   No facility-administered encounter medications on file as of 01/02/2024.    Allergies (verified) Amoxicillin and Bactrim [sulfamethoxazole-trimethoprim]   History: Past Medical History:  Diagnosis Date   Aortic atherosclerosis (HCC)    Aortic root dilation (HCC)    a.) TTE [id number]: Ao root measured 39 mm.   BPH (benign prostatic hyperplasia)    CHF (congestive heart failure) (HCC)    Coronary artery disease    a.) remote PCI of the RI in 1997; stent (unknown type) placed.  Depression    Diverticulosis    Eczema    GERD (gastroesophageal reflux disease)    Hyperlipidemia    Hypertension    Inguinal hernia, left    Long term current use of anticoagulant    a.) apixaban   Nephrolithiasis    NSVT (nonsustained ventricular tachycardia) (HCC)    Osteoarthrosis    Paroxysmal A-fib (HCC)    a.) CHA2DS2VASc = 5 (age x2, CHF, HTN, vascular disease history);  b.) rate/rhythm maintained on oral metoprolol succinate; chronically anticoagulated with apixaban   Pharyngeal dysphagia    Presence of permanent cardiac pacemaker 2007   a.) Medtronic device placed in [place] in 2007; b.) generator changed at [facility] on 11/18/2015   RBBB    SSS (sick sinus syndrome) (HCC) 2007   a.) s/p PPM  placement in [place] in 2007   Past Surgical History:  Procedure Laterality Date   BLEPHAROPLASTY Bilateral    CARDIAC CATHETERIZATION  09/25/1995   CARDIAC CATHETERIZATION  09/24/1998   CHOLECYSTECTOMY     COLONOSCOPY  09/24/2010   CORONARY ANGIOPLASTY  09/25/1995   ramus   ESOPHAGOGASTRODUODENOSCOPY N/A 02/11/2017   Procedure: ESOPHAGOGASTRODUODENOSCOPY (EGD);  Surgeon: Christena Deem, MD;  Location: [facility] ENDOSCOPY;  Service: Endoscopy;  Laterality: N/A;   INGUINAL HERNIA REPAIR Left 1977   INSERT / REPLACE / REMOVE PACEMAKER  12/23/2005   Medtronic [id number] - placed in [place]   INSERTION OF MESH Left 06/08/2022   Procedure: INSERTION OF MESH;  Surgeon: Campbell Lerner, MD;  Location: [facility] ORS;  Service: General;  Laterality: Left;   MANDIBLE SURGERY  1966   PACEMAKER GENERATOR CHANGE N/A 11/18/2015   ROBOTIC ASSISTED LAPAROSCOPIC CHOLECYSTECTOMY  01/12/2022   TONSILLECTOMY     TOTAL HIP ARTHROPLASTY Right 2022   TOTAL SHOULDER REPLACEMENT     bilateral approx 2020 and 2021   Family History  Problem Relation Age of Onset   Heart disease Mother    Hypertension Mother    Hyperlipidemia Mother    Heart attack Father    Heart disease Brother    Social History   Socioeconomic History   Marital status: Married    Spouse name: Not on file   Number of children: Not on file   Years of education: Not on file   Highest education level: Not on file  Occupational History   Not on file  Tobacco Use   Smoking status: Never   Smokeless tobacco: Never  Vaping Use   Vaping status: Never Used  Substance and Sexual Activity   Alcohol use: Yes    Comment: 1 beer monthly, mixed drink 2-3 times yearly   Drug use: No   Sexual activity: Not on file  Other Topics Concern   Not on file  Social History Narrative   Not on file   Social Drivers of Health   Financial Resource Strain: Low Risk  (01/08/2022)   Received from [facility], [facility]   Overall Financial  Resource Strain (CARDIA)    Difficulty of Paying Living Expenses: Not hard at all  Food Insecurity: No Food Insecurity (01/08/2022)   Received from [facility], [facility]   Hunger Vital Sign    Worried About Running Out of Food in the Last Year: Never true    Ran Out of Food in the Last Year: Never true  Transportation Needs: No Transportation Needs (01/08/2022)   Received from [facility], [facility]   [facility] - Transportation  Lack of Transportation (Medical): No    Lack of Transportation (Non-Medical): No  Physical Activity: Not on file  Stress: Not on file  Social Connections: Not on file    Tobacco Counseling Counseling given: Not Answered   Clinical Intake:  Pre-visit preparation completed: Yes  Pain : No/denies pain     BMI - recorded: 23 Nutritional Status: BMI of 19-24  Normal  How often do you need to have someone help you when you read instructions, pamphlets, or other written materials from your doctor or pharmacy?: 1 - Never         Activities of Daily Living    01/02/2024    9:04 AM  In your present state of health, do you have any difficulty performing the following activities:  Hearing? 1  Comment hearing aides  Vision? 1  Comment has upcomng eye exam  Difficulty concentrating or making decisions? 0  Walking or climbing stairs? 0  Dressing or bathing? 0  Doing errands, shopping? 0  Preparing Food and eating ? N  Using the Toilet? N  In the past six months, have you accidently leaked urine? N  Do you have problems with loss of bowel control? N  Managing your Medications? N  Managing your Finances? N  Housekeeping or managing your Housekeeping? N    Patient Care Team: Earnestine Mealing, MD as PCP - General (Family Medicine) Antonieta Iba, MD as PCP - Cardiology (Cardiology) Duke Salvia, MD as PCP - Electrophysiology (Cardiology) Antonieta Iba, MD as Consulting Physician (Cardiology) Christene Lye, MD as  Referring Physician (Otolaryngology) Sondra Come, MD as Consulting Physician (Urology) Abbie Sons, MD as Referring Physician (Dermatology) Pa, [facility]) Raechel Chute, MD as Consulting Physician (Pulmonary Disease)  Indicate any recent Medical Services you may have received from other than [facility] providers in the past year (date may be approximate).     Assessment:   This is a routine wellness examination for Nathanyel.  Hearing/Vision screen No results found.   Goals Addressed   None    Depression Screen    01/02/2024    9:08 AM 05/22/2023    2:57 PM 05/09/2023   10:05 AM 04/19/2023    2:10 PM 01/21/2023    1:26 PM 12/26/2022    2:11 PM 12/26/2022    1:09 PM  PHQ 2/9 Scores  PHQ - 2 Score 0 0 0 0 0 4 0  PHQ- 9 Score  2    9     Fall Risk    01/02/2024    9:07 AM 05/09/2023   10:05 AM 04/19/2023    2:10 PM 01/21/2023    1:26 PM 12/26/2022    1:09 PM  Fall Risk   Falls in the past year? 1 1 0 0 0  Number falls in past yr: 1 1 1  0 0  Injury with Fall? 1 0 1 0 0  Risk for fall due to : History of fall(s);Impaired mobility History of fall(s);Impaired balance/gait     Follow up Falls evaluation completed;Education provided;Falls prevention discussed Falls evaluation completed       MEDICARE RISK AT HOME: Medicare Risk at Home Any stairs in or around the home?: Yes If so, are there any without handrails?: No Home free of loose throw rugs in walkways, pet beds, electrical cords, etc?: Yes Adequate lighting in your home to reduce risk of falls?: Yes Life alert?: No Use of a cane, walker or w/c?: Yes Grab bars  in the bathroom?: Yes Shower chair or bench in shower?: Yes Elevated toilet seat or a handicapped toilet?: Yes  TIMED UP AND GO:  Was the test performed?  No    Cognitive Function:        01/02/2024    9:09 AM 12/20/2022    9:16 AM  6CIT Screen  What Year? 0 points 0 points  What month? 0 points 0 points  What time? 0 points 0 points   Count back from 20 0 points 0 points  Months in reverse 0 points 0 points  Repeat phrase 2 points 2 points  Total Score 2 points 2 points    Immunizations Immunization History  Administered Date(s) Administered   Fluad Quad(high Dose 65+) 07/25/2021, 07/17/2022   H1N1 09/02/2008   Influenza, Seasonal, Injecte, Preservative Fre 08/01/2006, 09/11/2007, 06/10/2008, 07/28/2009, 06/08/2010, 07/12/2011, 08/05/2012, 07/07/2013   Influenza,inj,Quad PF,6+ Mos 08/05/2012, 06/14/2015, 06/22/2016, 08/05/2017, 07/01/2018, 06/09/2019, 06/02/2020   Influenza-Unspecified 06/13/2014, 07/25/2021, 07/17/2022, 07/18/2023   Moderna Covid-19 Fall Seasonal Vaccine 57yrs & older 08/03/2022, 01/01/2023   Moderna Covid-19 Vaccine Bivalent Booster 44yrs & up 06/15/2021, 02/20/2022   Moderna Sars-Covid-2 Vaccination 11/06/2019, 12/04/2019, 08/09/2020, 02/09/2021, 08/03/2022   Pneumococcal Conjugate-13 06/14/2015   Pneumococcal Polysaccharide-23 08/01/2006, 08/05/2012   RSV,unspecified 07/28/2022   Respiratory Syncytial Virus Vaccine,Recomb Aduvanted(Arexvy) 07/28/2022   Tdap 03/01/2023   Unspecified SARS-COV-2 Vaccination 06/21/2023   Zoster Recombinant(Shingrix) 08/07/2019, 10/09/2019    TDAP status: Up to date  Flu Vaccine status: Up to date  Pneumococcal vaccine status: Up to date  Covid-19 vaccine status: Information provided on how to obtain vaccines.   Qualifies for Shingles Vaccine? Yes   Zostavax completed No   Shingrix Completed?: Yes  Screening Tests Health Maintenance  Topic Date Due   COVID-19 Vaccine (10 - Moderna risk 2024-25 season) 12/19/2023   INFLUENZA VACCINE  04/24/2024   Medicare Annual Wellness (AWV)  01/01/2025   DTaP/Tdap/Td (2 - Td or Tdap) 02/28/2033   Pneumonia Vaccine 82+ Years old  Completed   Zoster Vaccines- Shingrix  Completed   HPV VACCINES  Aged Out   Meningococcal B Vaccine  Aged Out    Health Maintenance  Health Maintenance Due  Topic Date Due    COVID-19 Vaccine (10 - Moderna risk 2024-25 season) 12/19/2023    Colorectal cancer screening: No longer required.   Lung Cancer Screening: (Low Dose CT Chest recommended if Age 26-80 years, 20 pack-year currently smoking OR have quit w/in 15years.) does not qualify.   Lung Cancer Screening Referral: na  Additional Screening:  Hepatitis C Screening: does not qualify; Completed   Vision Screening: Recommended annual ophthalmology exams for early detection of glaucoma and other disorders of the eye. Is the patient up to date with their annual eye exam?  Yes  Who is the provider or what is the name of the office in which the patient attends annual eye exams? [facility] If pt is not established with a provider, would they like to be referred to a provider to establish care? No .   Dental Screening: Recommended annual dental exams for proper oral hygiene  Community Resource Referral / Chronic Care Management: CRR required this visit?  No   CCM required this visit?  No     Plan:     I have personally reviewed and noted the following in the patient's chart:   Medical and social history Use of alcohol, tobacco or illicit drugs  Current medications and supplements including opioid prescriptions. Patient is not currently taking  opioid prescriptions. Functional ability and status Nutritional status Physical activity Advanced directives List of other physicians Hospitalizations, surgeries, and ER visits in previous 12 months Vitals Screenings to include cognitive, depression, and falls Referrals and appointments  In addition, I have reviewed and discussed with patient certain preventive protocols, quality metrics, and best practice recommendations. A written personalized care plan for preventive services as well as general preventive health recommendations were provided to patient.     Sharon Seller, NP   01/02/2024

## 2024-01-24 ENCOUNTER — Ambulatory Visit: Payer: Medicare Other | Admitting: Student

## 2024-01-24 ENCOUNTER — Encounter: Payer: Self-pay | Admitting: Student

## 2024-01-24 ENCOUNTER — Ambulatory Visit: Admitting: Student

## 2024-01-24 VITALS — BP 126/74 | HR 84 | Temp 98.0°F | Ht 71.0 in | Wt 172.0 lb

## 2024-01-24 DIAGNOSIS — R0982 Postnasal drip: Secondary | ICD-10-CM | POA: Diagnosis not present

## 2024-01-24 DIAGNOSIS — F419 Anxiety disorder, unspecified: Secondary | ICD-10-CM | POA: Diagnosis not present

## 2024-01-24 DIAGNOSIS — R0609 Other forms of dyspnea: Secondary | ICD-10-CM | POA: Diagnosis not present

## 2024-01-24 DIAGNOSIS — R110 Nausea: Secondary | ICD-10-CM | POA: Diagnosis not present

## 2024-01-24 DIAGNOSIS — F321 Major depressive disorder, single episode, moderate: Secondary | ICD-10-CM

## 2024-01-24 MED ORDER — FLUTICASONE PROPIONATE 50 MCG/ACT NA SUSP: 2.0000 | Freq: Two times a day (BID) | NASAL | 3 refills | Status: AC

## 2024-01-24 MED ORDER — SERTRALINE HCL 100 MG PO TABS: 100.0000 mg | ORAL_TABLET | Freq: Every day | ORAL | Status: DC

## 2024-01-24 NOTE — Progress Notes (Signed)
 [facility]: [facility] Provider: Jann Melody  Code Status: Full Code Goals of Care:     01/02/2024    9:08 AM  Advanced Directives  Does Patient Have a Medical Advance Directive? Yes     Chief Complaint  Patient presents with   Medical Management of Chronic Issues    Medical Management of Chronic Issues. 3 Month follow up    HPI: Patient is a 88 y.o. male seen today for medical management of chronic diseases.   Discussed the use of AI scribe software for clinical note transcription with the patient, who gave verbal consent to proceed.  History of Present Illness   Kyle Morales is an 88 year old male who presents for a three-month follow-up.  His mood has been stable and improved since starting sertraline , currently at a dose of 100 mg daily, increased from an initial 25 mg. He experiences fatigue and low energy frequently. Occasional overeating leads to vomiting, particularly after consuming seafood or rich foods, an issue persisting for six months to a year.  He experiences shortness of breath with exertion, such as walking short distances, but not at rest. He uses an inhaler twice daily, though it does not significantly alleviate his symptoms. He recalls a pulmonology appointment about a year ago. He also mentions a past episode of bronchitis in March.  No chest pain, but he describes a sensation of pressure or tightening in his chest that resolves on its own. He uses Flonase  daily and is on his last bottle. He also takes furosemide  and believes he has an adequate supply.  He served in [employer] for 21 years and expresses concerns about medication costs in retirement, noting variability in affordability.         Past Medical History:  Diagnosis Date   Aortic atherosclerosis (HCC)    Aortic root dilation (HCC)    a.) TTE [id number]: Ao root measured 39 mm.   BPH (benign prostatic hyperplasia)    CHF (congestive heart failure) (HCC)    Coronary artery  disease    a.) remote PCI of the RI in 1997; stent (unknown type) placed.   Depression    Diverticulosis    Eczema    GERD (gastroesophageal reflux disease)    Hyperlipidemia    Hypertension    Inguinal hernia, left    Long term current use of anticoagulant    a.) apixaban    Nephrolithiasis    NSVT (nonsustained ventricular tachycardia) (HCC)    Osteoarthrosis    Paroxysmal A-fib (HCC)    a.) CHA2DS2VASc = 5 (age x2, CHF, HTN, vascular disease history);  b.) rate/rhythm maintained on oral metoprolol  succinate; chronically anticoagulated with apixaban    Pharyngeal dysphagia    Presence of permanent cardiac pacemaker 2007   a.) Medtronic device placed in [place]  in 2007; b.) generator changed at [facility] on 11/18/2015   RBBB    SSS (sick sinus syndrome) (HCC) 2007   a.) s/p PPM placement in [place]  in 2007    Past Surgical History:  Procedure Laterality Date   BLEPHAROPLASTY Bilateral    CARDIAC CATHETERIZATION  09/25/1995   CARDIAC CATHETERIZATION  09/24/1998   CHOLECYSTECTOMY     COLONOSCOPY  09/24/2010   CORONARY ANGIOPLASTY  09/25/1995   ramus   ESOPHAGOGASTRODUODENOSCOPY N/A 02/11/2017   Procedure: ESOPHAGOGASTRODUODENOSCOPY (EGD);  Surgeon: Deveron Fly, MD;  Location[facility] ENDOSCOPY;  Service: Endoscopy;  Laterality: N/A;   INGUINAL HERNIA REPAIR Left 1977   INSERT /  REPLACE / REMOVE PACEMAKER  12/23/2005   Medtronic [id number] - placed in [place]    INSERTION OF MESH Left 06/08/2022   Procedure: INSERTION OF MESH;  Surgeon: Flynn Hylan, MD;  Location: [facility] ORS;  Service: General;  Laterality: Left;   MANDIBLE SURGERY  1966   PACEMAKER GENERATOR CHANGE N/A 11/18/2015   ROBOTIC ASSISTED LAPAROSCOPIC CHOLECYSTECTOMY  01/12/2022   TONSILLECTOMY     TOTAL HIP ARTHROPLASTY Right 2022   TOTAL SHOULDER REPLACEMENT     bilateral approx 2020 and 2021    Allergies  Allergen Reactions   Amoxicillin Rash and Itching   Bactrim [Sulfamethoxazole-Trimethoprim] Rash     Outpatient Encounter Medications as of 01/24/2024  Medication Sig   acetaminophen  (TYLENOL ) 500 MG tablet Take 500 mg by mouth every 8 (eight) hours as needed.   albuterol  (VENTOLIN  HFA) 108 (90 Base) MCG/ACT inhaler Inhale 2 puffs into the lungs every 6 (six) hours as needed for wheezing or shortness of breath.   apixaban  (ELIQUIS ) 5 MG TABS tablet Take 1 tablet (5 mg total) by mouth 2 (two) times daily.   bisoprolol  (ZEBETA ) 5 MG tablet Take 1 tablet (5 mg total) by mouth daily.   [employer] ZINC  50 MG TABS Take 50 mg by mouth daily.   finasteride  (PROSCAR ) 5 MG tablet Take 1 tablet (5 mg total) by mouth daily.   furosemide  (LASIX ) 40 MG tablet Take 3 tablets (120 mg total) by mouth daily. 3 tablets daily and may take 4 tablets if needed   lidocaine  (LIDODERM ) 5 % Place 1 patch onto the skin daily. Remove & Discard patch within 12 hours or as directed by MD   lovastatin  (MEVACOR ) 20 MG tablet Take 1 tablet (20 mg total) by mouth at bedtime.   nitroGLYCERIN  (NITROSTAT ) 0.4 MG SL tablet Place 1 tablet (0.4 mg total) under the tongue every 5 (five) minutes as needed for chest pain.   potassium chloride  SA (KLOR-CON  M) 20 MEQ tablet Take 2 tablets (40 mEq total) by mouth daily. Take with lasix    tamsulosin  (FLOMAX ) 0.4 MG CAPS capsule Take 2 capsules (0.8 mg total) by mouth daily after breakfast.   TRELEGY ELLIPTA  100-62.5-25 MCG/ACT AEPB Inhale 1 puff into the lungs daily.   [DISCONTINUED] fluticasone  (FLONASE ) 50 MCG/ACT nasal spray Place 2 sprays into both nostrils 2 (two) times daily.   [DISCONTINUED] sertraline  (ZOLOFT ) 25 MG tablet TAKE 1 TABLET (25 MG TOTAL) BY MOUTH DAILY.   doxycycline  (VIBRA -TABS) 100 MG tablet Take 1 tablet (100 mg total) by mouth 2 (two) times daily. (Patient not taking: Reported on 01/24/2024)   fluticasone  (FLONASE ) 50 MCG/ACT nasal spray Place 2 sprays into both nostrils 2 (two) times daily.   sertraline  (ZOLOFT ) 100 MG tablet Take 1 tablet (100 mg total) by mouth daily.    No facility-administered encounter medications on file as of 01/24/2024.    Review of Systems:  Review of Systems  Health Maintenance  Topic Date Due   COVID-19 Vaccine (11 - 2024-25 season) 02/28/2024   INFLUENZA VACCINE  04/24/2024   Medicare Annual Wellness (AWV)  01/01/2025   DTaP/Tdap/Td (2 - Td or Tdap) 02/28/2033   Pneumonia Vaccine 3+ Years old  Completed   Zoster Vaccines- Shingrix  Completed   HPV VACCINES  Aged Out   Meningococcal B Vaccine  Aged Out    Physical Exam: Vitals:   01/24/24 1109  BP: 126/74  Pulse: 84  Temp: 98 F (36.7 C)  SpO2: 96%  Weight: 172 lb (78 kg)  Height:  5\' 11"  (1.803 m)   Body mass index is 23.99 kg/m. Physical Exam Physical Exam   CHEST: Lungs clear to auscultation bilaterally. ABDOMEN: Normal bowel sounds.      Labs reviewed: Basic Metabolic Panel: Recent Labs    04/11/23 1111 04/15/23 0800 06/05/23 1015 10/31/23 0750  NA 140 142  --  143  K 3.3* 3.8  --  3.7  CL 107 107  --  105  CO2 26 27  --  30  GLUCOSE 114* 100*  --  101*  BUN 16 18  --  12  CREATININE 1.02 0.96 1.00 0.88  CALCIUM 8.8* 9.1  --  8.9   Liver Function Tests: Recent Labs    04/15/23 0800 10/31/23 0750  AST 12 15  ALT 9 12  BILITOT 0.6 0.7  PROT 6.3 6.4   No results for input(s): "LIPASE", "AMYLASE" in the last 8760 hours. No results for input(s): "AMMONIA" in the last 8760 hours. CBC: Recent Labs    04/15/23 0800 09/09/23 0738 10/31/23 0750  WBC 5.4 6.2 5.3  NEUTROABS 3,267 3,720 2,979  HGB 13.8 15.4 15.5  HCT 41.0 45.6 46.5  MCV 97.2 98.7 99.8  PLT 141 168 158   Lipid Panel: No results for input(s): "CHOL", "HDL", "LDLCALC", "TRIG", "CHOLHDL", "LDLDIRECT" in the last 8760 hours. Lab Results  Component Value Date   HGBA1C 5.8 (H) 10/31/2023    Procedures since last visit: No results found. Results          Assessment/Plan    Shortness of breath on exertion Shortness of breath occurs with exertion, such as walking  short distances, but not at rest. Lungs are clear on examination. Last pulmonology appointment was almost a year ago. Cardiac and pulmonary rehab may be beneficial for monitored exercise. - Call pulmonology to schedule an appointment - Discuss cardiac and pulmonary rehab with cardiologist and pulmonologist  Nausea and vomiting after eating rich foods Intermittent nausea and vomiting after consuming rich foods, particularly seafood, over the past 6-12 months. Symptoms occur immediately or an hour after eating.  Depression Mood is well-managed on sertraline  100 mg. No issues with sleep or feelings of worthlessness. Some fatigue present. Prescription records need updating to reflect the correct dosage. - Continue sertraline  100 mg daily - Update prescription records to reflect correct sertraline  dosage  General Health Maintenance He is on Flonase  for nasal symptoms and furosemide  for other medical conditions. [facility] due to cost considerations. - Renew Flon[facility]  prescription [facility]  Follow-up Upcoming cardiology appointment and pacemaker check next week. No upcoming pulmonology appointments noted. - Follow up with cardiology next week - Schedule pulmonology appointment        Labs/tests ordered:  * No order type specified * Next appt:  04/29/2024  I spent greater than 30 minutes for the care of this patient in face to face time, chart review, clinical documentation, patient education.

## 2024-01-24 NOTE — Patient Instructions (Addendum)
 Please call and schedule and appointment with the pulmonologist for your continued shortness of breath. You previously saw Dr. Gay Katayama:  [facility] [address] [place], [place] [address] [phone]  VISIT SUMMARY:  You came in today for your three-month follow-up. Your mood has been stable and improved since starting sertraline . You mentioned experiencing fatigue, occasional overeating leading to vomiting, and shortness of breath with exertion. We discussed your current medications and concerns about their costs.  YOUR PLAN:  -SHORTNESS OF BREATH ON EXERTION: Shortness of breath with exertion means you feel breathless when you are active, like walking short distances. Your lungs sounded clear today. We recommend scheduling an appointment with a pulmonologist and discussing cardiac and pulmonary rehab with your cardiologist and pulmonologist.  -NAUSEA AND VOMITING AFTER EATING RICH FOODS: Nausea and vomiting after eating rich foods means you feel sick and may vomit after consuming foods like seafood. This has been happening for the past 6-12 months. We will monitor this issue and discuss further if it continues.  -DEPRESSION: Depression is a mood disorder that you are managing well with sertraline . Your mood is stable, and you are not experiencing sleep issues or feelings of worthlessness. We will continue your current dose of sertraline  at 100 mg daily and update your prescription records.  -GENERAL HEALTH MAINTENANCE: For your general health, you are using Flonase  for nasal symptoms and furosemide  for other conditions. We will renew your Flonase  prescription [facility] to help with [facility].  INSTRUCTIONS:  Please follow up with your cardiologist next week for your appointment and pacemaker check. Also, schedule an appointment with a pulmonologist to address your shortness of breath.

## 2024-01-28 ENCOUNTER — Encounter: Payer: Self-pay | Admitting: Nurse Practitioner

## 2024-01-28 ENCOUNTER — Ambulatory Visit: Admitting: Nurse Practitioner

## 2024-01-28 ENCOUNTER — Ambulatory Visit (INDEPENDENT_AMBULATORY_CARE_PROVIDER_SITE_OTHER): Payer: Medicare Other

## 2024-01-28 VITALS — BP 114/82 | HR 89 | Temp 98.0°F | Ht 71.0 in | Wt 166.0 lb

## 2024-01-28 DIAGNOSIS — I495 Sick sinus syndrome: Secondary | ICD-10-CM | POA: Diagnosis not present

## 2024-01-28 DIAGNOSIS — H1033 Unspecified acute conjunctivitis, bilateral: Secondary | ICD-10-CM

## 2024-01-28 LAB — CUP PACEART REMOTE DEVICE CHECK
Battery Remaining Longevity: 15 mo
Battery Voltage: 2.9 V
Brady Statistic AP VP Percent: 0.62 %
Brady Statistic AP VS Percent: 92.03 %
Brady Statistic AS VP Percent: 0.42 %
Brady Statistic AS VS Percent: 6.93 %
Brady Statistic RA Percent Paced: 91.91 %
Brady Statistic RV Percent Paced: 1.1 %
Date Time Interrogation Session: 20250506083028
Implantable Lead Connection Status: 753985
Implantable Lead Connection Status: 753985
Implantable Lead Implant Date: 20070411
Implantable Lead Implant Date: 20070411
Implantable Lead Location: 753859
Implantable Lead Location: 753860
Implantable Lead Model: 5076
Implantable Lead Model: 5076
Implantable Pulse Generator Implant Date: 20170224
Lead Channel Impedance Value: 342 Ohm
Lead Channel Impedance Value: 361 Ohm
Lead Channel Impedance Value: 399 Ohm
Lead Channel Impedance Value: 437 Ohm
Lead Channel Pacing Threshold Amplitude: 0.5 V
Lead Channel Pacing Threshold Amplitude: 1.25 V
Lead Channel Pacing Threshold Pulse Width: 0.4 ms
Lead Channel Pacing Threshold Pulse Width: 0.4 ms
Lead Channel Sensing Intrinsic Amplitude: 1 mV
Lead Channel Sensing Intrinsic Amplitude: 1 mV
Lead Channel Sensing Intrinsic Amplitude: 6.25 mV
Lead Channel Sensing Intrinsic Amplitude: 6.25 mV
Lead Channel Setting Pacing Amplitude: 3 V
Lead Channel Setting Pacing Amplitude: 3.5 V
Lead Channel Setting Pacing Pulse Width: 0.4 ms
Lead Channel Setting Sensing Sensitivity: 0.9 mV
Zone Setting Status: 755011
Zone Setting Status: 755011

## 2024-01-28 MED ORDER — ERYTHROMYCIN 5 MG/GM OP OINT: 1.0000 | TOPICAL_OINTMENT | Freq: Three times a day (TID) | OPHTHALMIC | 0 refills | Status: DC

## 2024-01-28 NOTE — Progress Notes (Signed)
 Careteam: Patient Care Team: Valrie Gehrig, MD as PCP - General (Family Medicine) Devorah Fonder, MD as PCP - Cardiology (Cardiology) Verona Goodwill, MD as PCP - Electrophysiology (Cardiology) Devorah Fonder, MD as Consulting Physician (Cardiology) Dorris Gaul, MD as Referring Physician (Otolaryngology) Lawerence Pressman, MD as Consulting Physician (Urology) Jaci Martini, MD as Referring Physician (Dermatology) Pa, [facility]) Vergia Glasgow, MD as Consulting Physician (Pulmonary Disease) PLACE OF SERVICE:  [facility]   Advanced Directive information    Allergies  Allergen Reactions   Amoxicillin Rash and Itching   Bactrim [Sulfamethoxazole-Trimethoprim] Rash    Chief Complaint  Patient presents with   Conjunctivitis    Pink Eye, started Friday both eyes.      Discussed the use of AI scribe software for clinical note transcription with the patient, who gave verbal consent to proceed.  History of Present Illness Kyle Morales is an 88 year old male who presents with red, draining eyes and suspected conjunctivitis.  He has red, draining eyes with yellowish discharge that occurs throughout the day, causing his eyes to stick together at night. He experiences itchiness and light sensitivity, often squinting due to discomfort.  He reports blurred vision, which is consistently present and worsens when wearing glasses. He also notes feeling sleepy all the time.  His daughter recently had pink eye, suggesting potential exposure. She was treated with antibiotic eye drops after being diagnosed at an urgent care facility.  He has a known sulfa allergy.     Review of Systems:  Review of Systems  Constitutional:  Negative for chills and fever.  Eyes:  Positive for blurred vision (not new), discharge and redness. Negative for pain.    Past Medical History:  Diagnosis Date   Aortic atherosclerosis (HCC)    Aortic root dilation  (HCC)    a.) TTE [id number]: Ao root measured 39 mm.   BPH (benign prostatic hyperplasia)    CHF (congestive heart failure) (HCC)    Coronary artery disease    a.) remote PCI of the RI in 1997; stent (unknown type) placed.   Depression    Diverticulosis    Eczema    GERD (gastroesophageal reflux disease)    Hyperlipidemia    Hypertension    Inguinal hernia, left    Long term current use of anticoagulant    a.) apixaban    Nephrolithiasis    NSVT (nonsustained ventricular tachycardia) (HCC)    Osteoarthrosis    Paroxysmal A-fib (HCC)    a.) CHA2DS2VASc = 5 (age x2, CHF, HTN, vascular disease history);  b.) rate/rhythm maintained on oral metoprolol  succinate; chronically anticoagulated with apixaban    Pharyngeal dysphagia    Presence of permanent cardiac pacemaker 2007   a.) Medtronic device placed in [place]  in 2007; b.) generator changed at [facility] on 11/18/2015   RBBB    SSS (sick sinus syndrome) (HCC) 2007   a.) s/p PPM placement in [place]  in 2007   Past Surgical History:  Procedure Laterality Date   BLEPHAROPLASTY Bilateral    CARDIAC CATHETERIZATION  09/25/1995   CARDIAC CATHETERIZATION  09/24/1998   CHOLECYSTECTOMY     COLONOSCOPY  09/24/2010   CORONARY ANGIOPLASTY  09/25/1995   ramus   ESOPHAGOGASTRODUODENOSCOPY N/A 02/11/2017   Procedure: ESOPHAGOGASTRODUODENOSCOPY (EGD);  Surgeon: Deveron Fly, MD;  Location: [facility] ENDOSCOPY;  Service: Endoscopy;  Laterality: N/A;   INGUINAL HERNIA REPAIR Left 1977   INSERT / REPLACE / REMOVE PACEMAKER  12/23/2005  Medtronic [id number] - placed in [place]    INSERTION OF MESH Left 06/08/2022   Procedure: INSERTION OF MESH;  Surgeon: Flynn Hylan, MD;  Location: [facility] ORS;  Service: General;  Laterality: Left;   MANDIBLE SURGERY  1966   PACEMAKER GENERATOR CHANGE N/A 11/18/2015   ROBOTIC ASSISTED LAPAROSCOPIC CHOLECYSTECTOMY  01/12/2022   TONSILLECTOMY     TOTAL HIP ARTHROPLASTY Right 2022   TOTAL SHOULDER REPLACEMENT      bilateral approx 2020 and 2021   Social History:   reports that he has never smoked. He has never used smokeless tobacco. He reports current alcohol use. He reports that he does not use drugs.  Family History  Problem Relation Age of Onset   Heart disease Mother    Hypertension Mother    Hyperlipidemia Mother    Heart attack Father    Heart disease Brother     Medications: Patient's Medications  New Prescriptions   No medications on file  Previous Medications   ACETAMINOPHEN  (TYLENOL ) 500 MG TABLET    Take 500 mg by mouth every 8 (eight) hours as needed.   ALBUTEROL  (VENTOLIN  HFA) 108 (90 BASE) MCG/ACT INHALER    Inhale 2 puffs into the lungs every 6 (six) hours as needed for wheezing or shortness of breath.   APIXABAN  (ELIQUIS ) 5 MG TABS TABLET    Take 1 tablet (5 mg total) by mouth 2 (two) times daily.   BISOPROLOL  (ZEBETA ) 5 MG TABLET    Take 1 tablet (5 mg total) by mouth daily.   [employer]S ZINC  50 MG TABS    Take 50 mg by mouth daily.   DOXYCYCLINE  (VIBRA -TABS) 100 MG TABLET    Take 1 tablet (100 mg total) by mouth 2 (two) times daily.   FINASTERIDE  (PROSCAR ) 5 MG TABLET    Take 1 tablet (5 mg total) by mouth daily.   FLUTICASONE  (FLONASE ) 50 MCG/ACT NASAL SPRAY    Place 2 sprays into both nostrils 2 (two) times daily.   FUROSEMIDE  (LASIX ) 40 MG TABLET    Take 3 tablets (120 mg total) by mouth daily. 3 tablets daily and may take 4 tablets if needed   LIDOCAINE  (LIDODERM ) 5 %    Place 1 patch onto the skin daily. Remove & Discard patch within 12 hours or as directed by MD   LOVASTATIN  (MEVACOR ) 20 MG TABLET    Take 1 tablet (20 mg total) by mouth at bedtime.   NITROGLYCERIN  (NITROSTAT ) 0.4 MG SL TABLET    Place 1 tablet (0.4 mg total) under the tongue every 5 (five) minutes as needed for chest pain.   POTASSIUM CHLORIDE  SA (KLOR-CON  M) 20 MEQ TABLET    Take 2 tablets (40 mEq total) by mouth daily. Take with lasix    SERTRALINE  (ZOLOFT ) 100 MG TABLET    Take 1 tablet (100 mg total) by  mouth daily.   TAMSULOSIN  (FLOMAX ) 0.4 MG CAPS CAPSULE    Take 2 capsules (0.8 mg total) by mouth daily after breakfast.   TRELEGY ELLIPTA  100-62.5-25 MCG/ACT AEPB    Inhale 1 puff into the lungs daily.  Modified Medications   No medications on file  Discontinued Medications   No medications on file    Physical Exam:  Vitals:   01/28/24 1057  BP: 114/82  Pulse: 89  Temp: 98 F (36.7 C)  SpO2: 96%  Weight: 166 lb (75.3 kg)  Height: 5\' 11"  (1.803 m)   Body mass index is 23.15 kg/m. Wt Readings from Last 3 Encounters:  01/28/24 166 lb (75.3 kg)  01/24/24 172 lb (78 kg)  12/12/23 168 lb (76.2 kg)    Physical Exam Eyes:     Extraocular Movements: Extraocular movements intact.     Conjunctiva/sclera:     Right eye: Right conjunctiva is injected. Exudate present.     Left eye: Left conjunctiva is injected. Exudate present.     Pupils: Pupils are equal, round, and reactive to light.     Labs reviewed: Basic Metabolic Panel: Recent Labs    04/11/23 1111 04/15/23 0800 06/05/23 1015 10/31/23 0750  NA 140 142  --  143  K 3.3* 3.8  --  3.7  CL 107 107  --  105  CO2 26 27  --  30  GLUCOSE 114* 100*  --  101*  BUN 16 18  --  12  CREATININE 1.02 0.96 1.00 0.88  CALCIUM 8.8* 9.1  --  8.9   Liver Function Tests: Recent Labs    04/15/23 0800 10/31/23 0750  AST 12 15  ALT 9 12  BILITOT 0.6 0.7  PROT 6.3 6.4   No results for input(s): "LIPASE", "AMYLASE" in the last 8760 hours. No results for input(s): "AMMONIA" in the last 8760 hours. CBC: Recent Labs    04/15/23 0800 09/09/23 0738 10/31/23 0750  WBC 5.4 6.2 5.3  NEUTROABS 3,267 3,720 2,979  HGB 13.8 15.4 15.5  HCT 41.0 45.6 46.5  MCV 97.2 98.7 99.8  PLT 141 168 158   Lipid Panel: No results for input(s): "CHOL", "HDL", "LDLCALC", "TRIG", "CHOLHDL", "LDLDIRECT" in the last 8760 hours. TSH: No results for input(s): "TSH" in the last 8760 hours. A1C: Lab Results  Component Value Date   HGBA1C 5.8 (H)  10/31/2023     Assessment/Plan Assessment and Plan Assessment & Plan Conjunctivitis Acute bilateral conjunctivitis High contagion risk.  - Prescribed erythromycin ophthalmic ointment, apply to lower lid of each eye TID for 1 week. - Advised strict hand hygiene and avoidance of eye contact. - Instructed to wash washcloths in hot water after each use.   Janeka Libman K. Denney Fisherman  [facility] [phone]

## 2024-01-30 ENCOUNTER — Ambulatory Visit (INDEPENDENT_AMBULATORY_CARE_PROVIDER_SITE_OTHER): Payer: Medicare Other | Admitting: Podiatry

## 2024-01-30 ENCOUNTER — Encounter: Payer: Self-pay | Admitting: Podiatry

## 2024-01-30 DIAGNOSIS — L84 Corns and callosities: Secondary | ICD-10-CM | POA: Diagnosis not present

## 2024-01-30 DIAGNOSIS — M79675 Pain in left toe(s): Secondary | ICD-10-CM | POA: Diagnosis not present

## 2024-01-30 DIAGNOSIS — M79674 Pain in right toe(s): Secondary | ICD-10-CM

## 2024-01-30 DIAGNOSIS — B351 Tinea unguium: Secondary | ICD-10-CM | POA: Diagnosis not present

## 2024-01-30 DIAGNOSIS — I739 Peripheral vascular disease, unspecified: Secondary | ICD-10-CM

## 2024-02-01 ENCOUNTER — Other Ambulatory Visit: Payer: Self-pay | Admitting: Student

## 2024-02-01 DIAGNOSIS — F419 Anxiety disorder, unspecified: Secondary | ICD-10-CM

## 2024-02-03 NOTE — Progress Notes (Signed)
  Subjective:  Patient ID: Kyle Morales, male    DOB: [birth year],  MRN: [id number]  Kyle Morales presents to clinic today for at risk foot care. Patient has h/o PAD and callus(es) right foot and painful mycotic toenails that are difficult to trim. Painful toenails interfere with ambulation. Aggravating factors include wearing enclosed shoe gear. Pain is relieved with periodic professional debridement. Painful calluses are aggravated when weightbearing with and without shoegear. Pain is relieved with periodic professional debridement.  Chief Complaint  Patient presents with   Nail Problem    "Toenails"   New problem(s): None.   PCP is Valrie Gehrig, MD.  Allergies  Allergen Reactions   Amoxicillin Rash and Itching   Bactrim [Sulfamethoxazole-Trimethoprim] Rash    Review of Systems: Negative except as noted in the HPI.  Objective: No changes noted in today's physical examination. There were no vitals filed for this visit. Kyle Morales is a pleasant 88 y.o. male WD, WN in NAD. AAO x 3.  Vascular Examination: CFT <3 seconds b/l. DP pulses faintly palpable b/l. PT pulses nonpalpable b/l. Digital hair absent. Skin temperature gradient warm to warm b/l. No pain with calf compression. No ischemia or gangrene. No cyanosis or clubbing noted b/l.    Neurological Examination: Sensation grossly intact b/l with 10 gram monofilament. Vibratory sensation intact b/l.   Dermatological Examination: No open wounds b/l. No interdigital macerations. Toenails 1-5 b/l thick, discolored, elongated with subungual debris and pain on dorsal palpation.  Pedal skin thin, shiny and atrophic b/l LE. Hyperkeratotic lesion(s) submet head 5 right foot.  No erythema, no edema, no drainage, no fluctuance.  Musculoskeletal Examination: Muscle strength 5/5 to all lower extremity muscle groups bilaterally. Hammertoe deformity noted 1-5 b/l.  Radiographs: None  Assessment/Plan: 1. Pain due to onychomycosis of  toenails of both feet   2. Callus   3. Peripheral vascular disease with claudication [facility])     Patient was evaluated and treated. All patient's and/or POA's questions/concerns addressed on today's visit. Toenails 1-5 debrided in length and girth without incident. Callus(es) submet head 5 right foot pared with sharp debridement without incident. Continue soft, supportive shoe gear daily. Report any pedal injuries to medical professional. Call office if there are any questions/concerns. -Shoe recommendations given for [employer] from [employer]. -Patient/POA to call should there be question/concern in the interim.   Return in about 9 weeks (around 04/02/2024).  Luella Sager, DPM      [place] LOCATION: [address] [place] [address]                   Office [phone]   [facility] LOCATION: [address] [place], [place] [address] Office [phone]

## 2024-02-04 ENCOUNTER — Telehealth: Payer: Self-pay | Admitting: Cardiovascular Disease

## 2024-02-04 NOTE — Telephone Encounter (Signed)
 Called patient, spoke with wife (okay per DPR).  Wife states they were in the grocery store today, he became very SOB while walking, had to sit down on her walker to rest. They had assistance in getting the groceries in the car. Patient wife states they came home checked his BP and it was normal range 129/78, HR was normal (did not provide this reading). Patient wife states she is concerned as SOB has worsened. Next Pulmonary visit is scheduled for June 6th, but they would like to discuss further with Dr.Gollan if able.   Scheduled for a visit next week 05/20.   Patient verbalized understanding of this date and time for appointment. Advised that they had 24 hour EMT assistance at the facility and if needed they would contact him.   Advised I would route to MD/RN to make aware.

## 2024-02-04 NOTE — Telephone Encounter (Signed)
 Pt c/o Shortness Of Breath: STAT if SOB developed within the last 24 hours or pt is noticeably SOB on the phone  1. Are you currently SOB (can you hear that pt is SOB on the phone)? No  2. How long have you been experiencing SOB? Off and on for two weeks   3. Are you SOB when sitting or when up moving around? Moving around   4. Are you currently experiencing any other symptoms? No

## 2024-02-09 ENCOUNTER — Ambulatory Visit: Payer: Self-pay | Admitting: Cardiology

## 2024-02-10 NOTE — Progress Notes (Signed)
 Cardiology Office Note  Date:  02/11/2024   ID:  Kyle Morales, Kyle Morales 11-Feb-1936, MRN [id number]  PCP:  Kyle Gehrig, MD   Chief Complaint  Patient presents with   Shortness of Breath    Patient c/o shortness of breath, racing heart beats, off balance and right leg swelling.      HPI:  Kyle Morales is a  88 year-old gentleman history of  paroxysmal atrial fibrillation dating back to 1992   coronary artery disease with stent placement at [facility] in 1997,  sinus node dysfunction pacemaker placement in [place]  in 2007 for diagnosis of heart block chronic diarrhea Pacemaker change out done at [facility],  Who presents for followup of his paroxysmal atrial fibrillation and coronary artery disease.  LOV with myself June 2024 Seen by EP July 2024  In follow-up today he reports having worsening shortness of breath, has to stop when walking Weight down 5 pounds Reports he will give out after exertion, better after a nap  Takes lasix  120 mg daily, potassium every other day Once a week takes extra lasix  40  Scheduled to see pulmonary Reports having unilateral trace leg swelling worse on the right than the left, relatively stable  Pacer download  AP-VS.  95 AHR, longest 5 hours, burden 3%, EGMs consistent with AFL.  Increase in AF burden noted in April per trend.  31 VHR, available EGMs consistent with NSVT, longest 9  beats.  1 device classified SVT, longest 7 sec.   Lives at [facility] Sedentary at baseline, uses a cane for ambulation  Prior history of chronic SOB, chronic cough Previously seen by pulmonary, prior PFTs reviewed On Trelegy, Atrovent   Blood pressure low, denies orthostasis symptoms  In the past has tried several different beta-blockers including atenolol , metoprolol , bystolic  and bisoprolol  Less fatigue on bisoprolol  5 daily  Chronic tremor  Lab work reviewed Stable BMP  EKG personally reviewed by myself on todays visit EKG Interpretation Date/Time:  Tuesday Feb 11 2024 11:31:27 EDT Ventricular Rate:  72 PR Interval:  280 QRS Duration:  118 QT Interval:  440 QTC Calculation: 481 R Axis:   -5  Text Interpretation: Atrial-paced rhythm with prolonged AV conduction Incomplete right bundle branch block ST & T wave abnormality, consider anterolateral ischemia Prolonged QT When compared with ECG of 26-Jul-2023 10:11, No significant change was found Confirmed by Kyle Morales [phone]) on 02/11/2024 11:49:25 AM   With past medical history reviewed  hip surgery 12/20/20 at [facility] Notes reviewed, EKGs indicating he developed accelerated junctional rhythm  ECG: 12/23/20 atrial tach, RBBB, single V paced complex Cardiology was consulted at [facility], they reported he had atrial tachycardia Initially placed on diltiazem and metoprolol , metoprolol  was held for low blood pressure  Echo at [facility]   2. Echo contrast utilized to enhance endocardial border definition.    3. The left ventricular systolic function is normal, LVEF is visually  estimated at 55-60%.    4. The right ventricle is not well visualized but probably normal in size,  with normal systolic function.   Went to [facility] 10 days for PT  Prior history of UTI, went to [facility] treated with ABX, enterococcus Faecalis in the urine culture  Echocardiogram March 14, 2017 Results discussed with him in detail Ejection fraction 50-55%, mildly dilated aortic root 39 mm  Left atrium severely dilated, mild to moderate MR   Previous history of atrial fibrillation in the setting of GI distress and diarrhea   cultures were negative. Managed by  GI   PMH:   has a past medical history of Aortic atherosclerosis (HCC), Aortic root dilation (HCC), BPH (benign prostatic hyperplasia), CHF (congestive heart failure) (HCC), Coronary artery disease, Depression, Diverticulosis, Eczema, GERD (gastroesophageal reflux disease), Hyperlipidemia, Hypertension, Inguinal hernia, left, Long term current use of anticoagulant, Nephrolithiasis,  NSVT (nonsustained ventricular tachycardia) (HCC), Osteoarthrosis, Paroxysmal A-fib (HCC), Pharyngeal dysphagia, Presence of permanent cardiac pacemaker (2007), RBBB, and SSS (sick sinus syndrome) (HCC) (2007).  PSH:    Past Surgical History:  Procedure Laterality Date   BLEPHAROPLASTY Bilateral    CARDIAC CATHETERIZATION  09/25/1995   CARDIAC CATHETERIZATION  09/24/1998   CHOLECYSTECTOMY     COLONOSCOPY  09/24/2010   CORONARY ANGIOPLASTY  09/25/1995   ramus   ESOPHAGOGASTRODUODENOSCOPY N/A 02/11/2017   Procedure: ESOPHAGOGASTRODUODENOSCOPY (EGD);  Surgeon: Kyle Fly, MD;  Location: [facility] ENDOSCOPY;  Service: Endoscopy;  Laterality: N/A;   INGUINAL HERNIA REPAIR Left 1977   INSERT / REPLACE / REMOVE PACEMAKER  12/23/2005   [employer] [id number] - placed in [place]    INSERTION OF MESH Left 06/08/2022   Procedure: INSERTION OF MESH;  Surgeon: Kyle Hylan, MD;  Location: [facility] ORS;  Service: General;  Laterality: Left;   MANDIBLE SURGERY  1966   PACEMAKER GENERATOR CHANGE N/A 11/18/2015   ROBOTIC ASSISTED LAPAROSCOPIC CHOLECYSTECTOMY  01/12/2022   TONSILLECTOMY     TOTAL HIP ARTHROPLASTY Right 2022   TOTAL SHOULDER REPLACEMENT     bilateral approx 2020 and 2021    Current Outpatient Medications  Medication Sig Dispense Refill   acetaminophen  (TYLENOL ) 500 MG tablet Take 500 mg by mouth every 8 (eight) hours as needed.     albuterol  (VENTOLIN  HFA) 108 (90 Base) MCG/ACT inhaler Inhale 2 puffs into the lungs every 6 (six) hours as needed for wheezing or shortness of breath. 8 g 3   apixaban  (ELIQUIS ) 5 MG TABS tablet Take 1 tablet (5 mg total) by mouth 2 (two) times daily. 180 tablet 3   bisoprolol  (ZEBETA ) 5 MG tablet Take 1 tablet (5 mg total) by mouth daily. 90 tablet 3   [employer] ZINC  50 MG TABS Take 50 mg by mouth daily.     erythromycin  ophthalmic ointment Place 1 Application into both eyes 3 (three) times daily. 3.5 g 0   finasteride  (PROSCAR ) 5 MG tablet Take 1 tablet (5  mg total) by mouth daily. 90 tablet 1   fluticasone  (FLONASE ) 50 MCG/ACT nasal spray Place 2 sprays into both nostrils 2 (two) times daily. 18 mL 3   furosemide  (LASIX ) 40 MG tablet Take 3 tablets (120 mg total) by mouth daily. 3 tablets daily and may take 4 tablets if needed 270 tablet 3   lidocaine  (LIDODERM ) 5 % Place 1 patch onto the skin daily. Remove & Discard patch within 12 hours or as directed by MD 30 patch 0   lovastatin  (MEVACOR ) 20 MG tablet Take 1 tablet (20 mg total) by mouth at bedtime. 90 tablet 3   nitroGLYCERIN  (NITROSTAT ) 0.4 MG SL tablet Place 1 tablet (0.4 mg total) under the tongue every 5 (five) minutes as needed for chest pain. 25 tablet 1   potassium chloride  SA (KLOR-CON  M) 20 MEQ tablet Take 2 tablets (40 mEq total) by mouth daily. Take with lasix      sertraline  (ZOLOFT ) 100 MG tablet TAKE 1 TABLET BY MOUTH EVERY DAY 90 tablet 1   tamsulosin  (FLOMAX ) 0.4 MG CAPS capsule Take 2 capsules (0.8 mg total) by mouth daily after breakfast. 180 capsule 3   TRELEGY  ELLIPTA 100-62.5-25 MCG/ACT AEPB Inhale 1 puff into the lungs daily.     No current facility-administered medications for this visit.    Allergies:   Amoxicillin and Bactrim [sulfamethoxazole-trimethoprim]   Social History:  The patient  reports that he has never smoked. He has never used smokeless tobacco. He reports current alcohol use. He reports that he does not use drugs.   Family History:   family history includes Heart attack in his father; Heart disease in his brother and mother; Hyperlipidemia in his mother; Hypertension in his mother.    Review of Systems: Review of Systems  HENT: Negative.    Respiratory:  Positive for cough and shortness of breath.   Cardiovascular: Negative.   Gastrointestinal: Negative.   Musculoskeletal: Negative.        Leg weakness  Neurological:  Positive for tremors.  Psychiatric/Behavioral: Negative.    All other systems reviewed and are negative.   PHYSICAL  EXAM: VS:  BP 100/64 (BP Location: Left Arm, Patient Position: Sitting, Cuff Size: Normal)   Pulse 72   Ht 5\' 11"  (1.803 m)   Wt 173 lb 2 oz (78.5 kg)   SpO2 93%   BMI 24.15 kg/m  , BMI Body mass index is 24.15 kg/m. Constitutional:  oriented to person, place, and time. No distress.  HENT:  Head: Grossly normal Eyes:  no discharge. No scleral icterus.  Neck: No JVD, no carotid bruits  Cardiovascular: Regular rate and rhythm, no murmurs appreciated, trace lower extremity edema right leg Pulmonary/Chest: Clear to auscultation bilaterally, no wheezes or rales Abdominal: Soft.  no distension.  no tenderness.  Musculoskeletal: Normal range of motion Neurological:  normal muscle tone. Coordination normal. No atrophy Skin: Skin warm and dry Psychiatric: normal affect, pleasant   Recent Labs: 10/31/2023: ALT 12; BUN 12; Creat 0.88; Hemoglobin 15.5; Platelets 158; Potassium 3.7; Sodium 143    Lipid Panel Lab Results  Component Value Date   CHOL 136 02/20/2017   HDL 32 (L) 02/20/2017   LDLCALC 70 02/20/2017   TRIG 172 (H) 02/20/2017     Wt Readings from Last 3 Encounters:  02/11/24 173 lb 2 oz (78.5 kg)  01/28/24 166 lb (75.3 kg)  01/24/24 172 lb (78 kg)     ASSESSMENT AND PLAN:  Atrial fibrillation, unspecified type (HCC) - Pacer downloads reviewed, Low A-fib burden, 3% On eliquis ,  Bisoprolol  5 daily Followed by EP  Nonsustained VT Monitored by EP on pacer downloads Rare nonsustained VT  Shortness of breath Chronic issue, likely multifactorial Followed by pulmonary Low normal ejection fraction 50 to 55%,  Deconditioned,  Recommend he increase Lasix  120 twice daily for 3 days then add Lasix  120 in the afternoon every other day in addition to his regular morning dose Potassium 20 daily Recheck BMP in 2 weeks time when he sees either Dr. Jann Melody or pulmonary  Pure hypercholesterolemia On lovastatin ,  Cholesterol typically at goal  Atherosclerosis of native  coronary artery of native heart  Could consider Myoview if no improvement in shortness of breath symptoms  Orthostatic hypotension Blood pressure low but stable, asymptomatic on bisoprolol  5 mg daily   Leg swelling Trace edema, more unilateral Extra Lasix  as above for worsening edema and shortness of breath with weight trending down  Hypothyroidism Followed by primary care   Orders Placed This Encounter  Procedures   EKG 12-Lead     Signed, Juanda Noon, M.D., Ph.D. 02/11/2024  [facility] [place], [place] [phone]

## 2024-02-11 ENCOUNTER — Ambulatory Visit: Attending: Cardiovascular Disease | Admitting: Cardiovascular Disease

## 2024-02-11 ENCOUNTER — Encounter: Payer: Self-pay | Admitting: Cardiovascular Disease

## 2024-02-11 VITALS — BP 100/64 | HR 72 | Ht 71.0 in | Wt 173.1 lb

## 2024-02-11 DIAGNOSIS — I509 Heart failure, unspecified: Secondary | ICD-10-CM

## 2024-02-11 DIAGNOSIS — I77819 Aortic ectasia, unspecified site: Secondary | ICD-10-CM

## 2024-02-11 DIAGNOSIS — Z950 Presence of cardiac pacemaker: Secondary | ICD-10-CM | POA: Diagnosis not present

## 2024-02-11 DIAGNOSIS — I480 Paroxysmal atrial fibrillation: Secondary | ICD-10-CM

## 2024-02-11 DIAGNOSIS — I25118 Atherosclerotic heart disease of native coronary artery with other forms of angina pectoris: Secondary | ICD-10-CM | POA: Diagnosis present

## 2024-02-11 DIAGNOSIS — I951 Orthostatic hypotension: Secondary | ICD-10-CM

## 2024-02-11 DIAGNOSIS — I495 Sick sinus syndrome: Secondary | ICD-10-CM

## 2024-02-11 DIAGNOSIS — I4729 Other ventricular tachycardia: Secondary | ICD-10-CM | POA: Diagnosis present

## 2024-02-11 DIAGNOSIS — I482 Chronic atrial fibrillation, unspecified: Secondary | ICD-10-CM

## 2024-02-11 DIAGNOSIS — R0602 Shortness of breath: Secondary | ICD-10-CM

## 2024-02-11 MED ORDER — POTASSIUM CHLORIDE CRYS ER 20 MEQ PO TBCR
20.0000 meq | EXTENDED_RELEASE_TABLET | Freq: Every day | ORAL | 3 refills | Status: AC
Start: 2024-02-11 — End: ?

## 2024-02-11 MED ORDER — POTASSIUM CHLORIDE CRYS ER 20 MEQ PO TBCR: 20.0000 meq | EXTENDED_RELEASE_TABLET | Freq: Every day | ORAL | 3 refills | Status: DC

## 2024-02-11 MED ORDER — FUROSEMIDE 40 MG PO TABS: 120.0000 mg | ORAL_TABLET | Freq: Every day | ORAL | 3 refills | Status: AC

## 2024-02-11 MED ORDER — FUROSEMIDE 40 MG PO TABS: 120.0000 mg | ORAL_TABLET | Freq: Every day | ORAL | 3 refills | Status: DC

## 2024-02-11 NOTE — Telephone Encounter (Signed)
 Patient seen in clinic today. All questions and concerns addressed at this time.

## 2024-02-11 NOTE — Patient Instructions (Addendum)
 Medication Instructions:  Lasix  120 mg twice a day for 3 days Then starting Friday, alternate 120 twice a day with 120 mg daily  Potassium 20 meq daily  BMP labs with pulmonary or Dr. Jann Melody in 2 weeks  If you need a refill on your cardiac medications before your next appointment, please call your pharmacy.   Lab work: No new labs needed  Testing/Procedures: No new testing needed  Follow-Up: At [facility], you and your health needs are our priority.  As part of our continuing mission to provide you with exceptional heart care, we have created designated Provider Care Teams.  These Care Teams include your primary Cardiologist (physician) and Advanced Practice Providers (APPs -  Physician Assistants and Nurse Practitioners) who all work together to provide you with the care you need, when you need it.  You will need a follow up appointment in 1 month  Providers on your designated Care Team:   Laneta Pintos, NP Varney Gentleman, PA-C Cadence Gennaro Khat, [place]  COVID-19 Vaccine Information can be found at: [email or web address] For questions related to vaccine distribution or appointments, please email [email or web address] or call [phone].

## 2024-02-17 NOTE — Progress Notes (Unsigned)
 Electrophysiology Office Note:   Date:  02/18/2024  ID:  Kyle Morales, DOB 1936/01/30, MRN [id number]  Primary Cardiologist: Belva Boyden, MD Electrophysiologist: Ardeen Kohler, MD      History of Present Illness:   Kyle Morales is a 88 y.o. male with h/o SND s/p PPM, CAD s/p PCI, parox AFib, NSVT and chronic diastolic heart failure seen today for follow visit for device management.   Discussed the use of AI scribe software for clinical note transcription with the patient, who gave verbal consent to proceed.  History of Present Illness Kyle Morales is an 88 year old male with atrial fibrillation and diastolic heart failure who presents with increased episodes of atrial fibrillation and worsening shortness of breath. He has a history of atrial fibrillation and diastolic heart failure. A recent pacemaker check indicated an increase in the time spent in atrial fibrillation. He experiences worsening shortness of breath, particularly when sitting and watching TV, and notes that physical activity exacerbates this symptom. The shortness of breath is worse than his usual baseline. He recalls a significant increase in atrial fibrillation episodes around mid-April, which coincided with a period of feeling unwell. He does not typically notice when he is in atrial fibrillation, as he does not experience palpitations or heart racing. Instead, he associates worsening symptoms with increased shortness of breath and fluid retention. He has a history of fluid retention and is currently taking furosemide  to manage this. He mentions taking more furosemide  recently, which he finds inconvenient due to frequent urination. He was previously on amiodarone  but was taken off the medication several years ago, as he was not experiencing significant atrial fibrillation at that time. He does not recall any adverse effects from the medication.  Review of systems complete and found to be negative unless listed in HPI.   EP  Information / Studies Reviewed:    EKG is not ordered today. EKG from 02/11/24 reviewed which showed AP with first degree AV block, PR .      Echo 10/11/22:   1. Left ventricular ejection fraction, by estimation, is 50 to 55%. The  left ventricle has low normal function. The left ventricle has no regional  wall motion abnormalities. There is moderate to severe left ventricular  hypertrophy mid ventricle  extending distally, severe in the apical region with near cavity  obliteration. Left ventricular diastolic parameters are indeterminate.   2. Right ventricular systolic function is normal. The right ventricular  size is normal. There is mildly elevated pulmonary artery systolic  pressure. The estimated right ventricular systolic pressure is 36.1 mmHg.   3. Left atrial size was severely dilated.   4. The mitral valve is normal in structure. Mild mitral valve  regurgitation. No evidence of mitral stenosis.   5. The aortic valve is tricuspid. Aortic valve regurgitation is not  visualized. No aortic stenosis is present.   6. There is borderline dilatation of the ascending aorta, measuring 37  mm.   7. The inferior vena cava is normal in size with greater than 50%  respiratory variability, suggesting right atrial pressure of 3 mmHg.    Physical Exam:   VS:  BP 104/70   Pulse 77   Ht 5\' 11"  (1.803 m)   Wt 169 lb 3.2 oz (76.7 kg)   SpO2 98%   BMI 23.60 kg/m    Wt Readings from Last 3 Encounters:  02/18/24 169 lb 3.2 oz (76.7 kg)  02/11/24 173 lb 2 oz (78.5 kg)  01/28/24  166 lb (75.3 kg)     GEN: Well nourished, well developed in no acute distress NECK: No JVD CARDIAC: Normal rate, regular rhythm RESPIRATORY:  Clear to auscultation without rales, wheezing or rhonchi  ABDOMEN: Soft, non-distended EXTREMITIES:  Trace edema; No deformity   ASSESSMENT AND PLAN:    #. Sinus node dysfunction s/p pacemaker, then progression to complete heart block:  - In clinic device  interrogation was performed, appropriate device function and stable lead parameters. - Continue remote monitoring.  #. Paroxysmal atrial fibrillation: Burden has increased, which appears to correlate with symptoms. Given his severe diastolic dysfunction, unlikely to tolerate lack of atrial contractility in AF very well. For this reason, we will initiate rhythm control.  #. Secondary hypercoagulable state due to atrial fibrillation:  -Continue Eliquis  5mg  BID.  -Continue bisoprolol  5 mg once daily. -Start amiodarone  200mg  once daily for 1 month then 100mg  daily thereafter. Repeat TSH at 3 month follow up visit. Most recent note from pulmonology states that there is no parenchymal disease or significant obstructive disease.   #. Chronic diastolic heart failure:  - Continue Lasix  and follow-up with primary cardiologist, Dr. Gollan.  Follow up with EP APP in 3 months to assess AF burden, tolerance of amiodarone , and to recheck TSH/LFTs.   Signed, Ardeen Kohler, MD

## 2024-02-18 ENCOUNTER — Ambulatory Visit: Attending: Cardiology | Admitting: Cardiology

## 2024-02-18 ENCOUNTER — Other Ambulatory Visit: Payer: Self-pay

## 2024-02-18 ENCOUNTER — Encounter: Payer: Self-pay | Admitting: Cardiology

## 2024-02-18 VITALS — BP 104/70 | HR 77 | Ht 71.0 in | Wt 169.2 lb

## 2024-02-18 DIAGNOSIS — I495 Sick sinus syndrome: Secondary | ICD-10-CM | POA: Diagnosis present

## 2024-02-18 DIAGNOSIS — I480 Paroxysmal atrial fibrillation: Secondary | ICD-10-CM | POA: Insufficient documentation

## 2024-02-18 DIAGNOSIS — D6869 Other thrombophilia: Secondary | ICD-10-CM | POA: Diagnosis present

## 2024-02-18 DIAGNOSIS — Z950 Presence of cardiac pacemaker: Secondary | ICD-10-CM | POA: Insufficient documentation

## 2024-02-18 DIAGNOSIS — I5032 Chronic diastolic (congestive) heart failure: Secondary | ICD-10-CM | POA: Insufficient documentation

## 2024-02-18 LAB — CUP PACEART INCLINIC DEVICE CHECK
Date Time Interrogation Session: 20250527102837
Implantable Lead Connection Status: 753985
Implantable Lead Connection Status: 753985
Implantable Lead Implant Date: 20070411
Implantable Lead Implant Date: 20070411
Implantable Lead Location: 753859
Implantable Lead Location: 753860
Implantable Lead Model: 5076
Implantable Lead Model: 5076
Implantable Pulse Generator Implant Date: 20170224

## 2024-02-18 MED ORDER — AMIODARONE HCL 100 MG PO TABS: ORAL_TABLET | ORAL | 2 refills | Status: DC

## 2024-02-18 NOTE — Patient Instructions (Signed)
 Medication Instructions:  Your physician has recommended you make the following change in your medication:   1) START taking amiodarone  to 200 mg (2 tablets) daily for one month, then decrease to 100 mg (1 tablet) daily thereafter  *If you need a refill on your cardiac medications before your next appointment, please call your pharmacy*  Lab Work: TODAY: TSH  If you have labs (blood work) drawn today and your tests are completely normal, you will receive your results only by: MyChart Message (if you have MyChart) OR A paper copy in the mail If you have any lab test that is abnormal or we need to change your treatment, we will call you to review the results.  Follow-Up: At [facility], you and your health needs are our priority.  As part of our continuing mission to provide you with exceptional heart care, our providers are all part of one team.  This team includes your primary Cardiologist (physician) and Advanced Practice Providers or APPs (Physician Assistants and Nurse Practitioners) who all work together to provide you with the care you need, when you need it.  Your next appointment:   3 months  Provider:   Suzann Riddle, NP

## 2024-02-19 ENCOUNTER — Ambulatory Visit: Payer: Self-pay | Admitting: Cardiology

## 2024-02-19 LAB — TSH: TSH: 3.83 u[IU]/mL (ref 0.450–4.500)

## 2024-02-28 ENCOUNTER — Ambulatory Visit: Admitting: Student in an Organized Health Care Education/Training Program

## 2024-02-28 ENCOUNTER — Encounter: Payer: Self-pay | Admitting: Student in an Organized Health Care Education/Training Program

## 2024-02-28 VITALS — BP 110/70 | HR 78 | Temp 97.3°F | Ht 71.0 in | Wt 173.0 lb

## 2024-02-28 DIAGNOSIS — R0602 Shortness of breath: Secondary | ICD-10-CM | POA: Diagnosis not present

## 2024-02-28 NOTE — Progress Notes (Signed)
 Assessment & Plan:   #Shortness of Breath   Patient is presenting for follow-up and reevaluation of his shortness of breath that is mostly exertional.  He has had PFTs in March 2024 that showed normal FEV1/FVC ratio of 0.78.  His lung volumes were mildly reduced at 77% predicted and his DLCO was moderately reduced at 49% of predicted.  I have again reviewed the patient's chest imaging and note the lung parenchyma to be within normal.  On close review of his most recent CT scan of the chest from February 2025, there again is no parenchymal lung disease but I do note some hypoinflation of specifically the lower lobes.  When looking in the sagittal and coronal windows, I do note some elevation of the right and left hemidiaphragms, more so on the right.  I also noted dilated loops of bowel which appeared to be pressing on his diaphragm (supra and peri-hepatic in location).  It is possible that this elevation is resulting in restriction to his respiratory effort and contributing to some of his exertional dyspnea.  Today, we again discussed the nature of lung function and its overall decline over the years.  He continues to be on Trelegy which I have asked him to continue using.   Overall, I suspect his shortness of breath is multifactorial and secondary to a restrictive pattern (elevated hemidiaphragm with bowels pressing on the diaphragm), decreased DLCO, and heart failure.  I recommend against further imaging (repeat CT chest showed the nodule to be smaller consistent with a benign process) or testing and have reassured the patient. I encouraged him to use incentive spirometry regularly (at least twice daily).  We also discussed management of central obesity and attempting to lose some central weight.  I also recommended a bowel regimen to be used daily to help with his bowel movements.  -Continue Trelegy Daily -Bowel regimen recommended -incentive spirometry twice daily -continue to follow with  cardiology  Return in about 6 months (around 08/29/2024).  I spent 32 minutes caring for this patient today, including preparing to see the patient, obtaining a medical history , reviewing a separately obtained history, performing a medically appropriate examination and/or evaluation, counseling and educating the patient/family/caregiver, ordering medications, tests, or procedures, documenting clinical information in the electronic health record, and independently interpreting results (not separately reported/billed) and communicating results to the patient/family/caregiver  Vergia Glasgow, MD [facility]  End of visit medications:  No orders of the defined types were placed in this encounter.    Current Outpatient Medications:    acetaminophen  (TYLENOL ) 500 MG tablet, Take 500 mg by mouth every 8 (eight) hours as needed., Disp: , Rfl:    albuterol  (VENTOLIN  HFA) 108 (90 Base) MCG/ACT inhaler, Inhale 2 puffs into the lungs every 6 (six) hours as needed for wheezing or shortness of breath., Disp: 8 g, Rfl: 3   amiodarone  (PACERONE ) 100 MG tablet, Take 2 tablets (200 mg total) by mouth daily for 30 days, THEN 1 tablet (100 mg total) daily., Disp: 180 tablet, Rfl: 2   apixaban  (ELIQUIS ) 5 MG TABS tablet, Take 1 tablet (5 mg total) by mouth 2 (two) times daily., Disp: 180 tablet, Rfl: 3   bisoprolol  (ZEBETA ) 5 MG tablet, Take 1 tablet (5 mg total) by mouth daily., Disp: 90 tablet, Rfl: 3   [employer] ZINC  50 MG TABS, Take 50 mg by mouth daily., Disp: , Rfl:    finasteride  (PROSCAR ) 5 MG tablet, Take 1 tablet (5 mg total)  by mouth daily., Disp: 90 tablet, Rfl: 1   fluticasone  (FLONASE ) 50 MCG/ACT nasal spray, Place 2 sprays into both nostrils 2 (two) times daily., Disp: 18 mL, Rfl: 3   furosemide  (LASIX ) 40 MG tablet, Take 3 tablets (120 mg total) by mouth daily. 3 tablets daily alternating with 3 tablets twice daily., Disp: 390 tablet, Rfl: 3   lidocaine  (LIDODERM ) 5 %, Place 1 patch  onto the skin daily. Remove & Discard patch within 12 hours or as directed by MD, Disp: 30 patch, Rfl: 0   lovastatin  (MEVACOR ) 20 MG tablet, Take 1 tablet (20 mg total) by mouth at bedtime., Disp: 90 tablet, Rfl: 3   nitroGLYCERIN  (NITROSTAT ) 0.4 MG SL tablet, Place 1 tablet (0.4 mg total) under the tongue every 5 (five) minutes as needed for chest pain., Disp: 25 tablet, Rfl: 1   potassium chloride  SA (KLOR-CON  M) 20 MEQ tablet, Take 1 tablet (20 mEq total) by mouth daily. Take with lasix , Disp: 90 tablet, Rfl: 3   sertraline  (ZOLOFT ) 100 MG tablet, TAKE 1 TABLET BY MOUTH EVERY DAY, Disp: 90 tablet, Rfl: 1   tamsulosin  (FLOMAX ) 0.4 MG CAPS capsule, Take 2 capsules (0.8 mg total) by mouth daily after breakfast., Disp: 180 capsule, Rfl: 3   TRELEGY ELLIPTA  100-62.5-25 MCG/ACT AEPB, Inhale 1 puff into the lungs daily., Disp: , Rfl:    erythromycin  ophthalmic ointment, Place 1 Application into both eyes 3 (three) times daily. (Patient not taking: Reported on 02/28/2024), Disp: 3.5 g, Rfl: 0   Subjective:   PATIENT ID: Kyle Morales GENDER: male DOB: 1936-02-13, MRN: [id number]  Chief Complaint  Patient presents with   Follow-up    DOE. No wheezing. Cough with clear sputum.    HPI  Mr. Cranston is a pleasant 88 year old male presenting for follow-up of shortness of breath.  I had met with Mr. Alberta in June 2024 for an evaluation of cough as well as shortness of breath.  At that time, his cough had been chronic and productive of clear sputum with associated exertional dyspnea.  He was started on inhalers without much improvement.   Since our last visit, he is followed closely with his PCP and has had multiple imaging studies of his chest.  There was a nodule that was noted in the right lower lobe on an abdominal CT, further evaluated with a chest CT as well as a PET/CT.  The repeat imaging showed the nodule to have shrunk in size and overall was suggestive of a benign etiology.  Today, she  presents for follow-up and is reporting continued shortness of breath.  He is okay at rest but does feel significant dyspnea when exerting himself.  Deep breaths also trigger a cough.  He was seen by SLP and was noted to have a normal swallowing pattern without any risk for recurrent aspiration. He also had a swallowing study in November 2023 showing silent aspiration.  Patient denies wheezing, chest pain, chest tightness, hemoptysis, night sweats, weight loss, fevers, and chills.    Patient also reports falls around 1-2 times a year. He follows with cardiology for CAD, Afib, and complete heart block s/p pacemaker placement. He's also been seen by ENT at [facility] where he is s/p left maxillectomy. He's also been evaluated for dysphagia and dysphonia by ENT.   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 No other exposures reported.  Ancillary information including prior medications, full medical/surgical/family/social histories, and PFTs (when available) are listed below and have been reviewed.   Review of Systems  Constitutional:  Negative for chills, fever and weight loss.  HENT:  Negative for hearing loss.   Respiratory:  Positive for cough and shortness of breath. Negative for sputum production.   Cardiovascular:  Negative for chest pain.  Skin:  Negative for rash.    Objective:   Vitals:   02/28/24 1059  BP: 110/70  Pulse: 78  Temp: (!) 97.3 F (36.3 C)  SpO2: 93%  Weight: 173 lb (78.5 kg)  Height: 5\' 11"  (1.803 m)   93% on RA  BMI Readings from Last 3 Encounters:  02/28/24 24.13 kg/m  02/18/24 23.60 kg/m  02/11/24 24.15 kg/m   Wt Readings from Last 3 Encounters:  02/28/24 173 lb (78.5 kg)  02/18/24 169 lb  3.2 oz (76.7 kg)  02/11/24 173 lb 2 oz (78.5 kg)    Physical Exam Constitutional:      Appearance: He is not ill-appearing.  HENT:     Mouth/Throat:     Mouth: Mucous membranes are moist.  Cardiovascular:     Rate and Rhythm: Normal rate and regular rhythm.     Pulses: Normal pulses.     Heart sounds: Normal heart sounds.  Pulmonary:     Effort: Pulmonary effort is normal.     Breath sounds: Normal breath sounds. No wheezing or rales.  Neurological:     General: No focal deficit present.     Mental Status: He is alert and oriented to person, place, and time. Mental status is at baseline.       Ancillary Information    Past Medical History:  Diagnosis Date   Aortic atherosclerosis (HCC)    Aortic root dilation (HCC)    a.) TTE [id number]: Ao root measured 39 mm.   BPH (benign prostatic hyperplasia)    CHF (congestive heart failure) (HCC)    Coronary artery disease    a.) remote PCI of the RI in 1997; stent (unknown type) placed.   Depression    Diverticulosis    Eczema    GERD (gastroesophageal reflux disease)    Hyperlipidemia    Hypertension    Inguinal hernia, left    Long term current use of anticoagulant    a.) apixaban    Nephrolithiasis    NSVT (nonsustained ventricular tachycardia) (HCC)    Osteoarthrosis    Paroxysmal A-fib (HCC)    a.) CHA2DS2VASc = 5 (age x2, CHF, HTN, vascular disease history);  b.) rate/rhythm maintained on oral metoprolol  succinate; chronically anticoagulated with apixaban    Pharyngeal dysphagia    Presence of permanent cardiac pacemaker 2007   a.) Medtronic device placed in [place]  in 2007; b.) generator changed at [facility] on 11/18/2015   RBBB    SSS (sick sinus syndrome) (HCC) 2007   a.) s/p PPM placement in [place]  in 2007     Family History  Problem Relation Age of Onset   Heart disease Mother    Hypertension Mother    Hyperlipidemia Mother    Heart attack Father    Heart disease Brother      Past Surgical History:   Procedure Laterality Date   BLEPHAROPLASTY Bilateral    CARDIAC CATHETERIZATION  09/25/1995   CARDIAC CATHETERIZATION  09/24/1998   CHOLECYSTECTOMY     COLONOSCOPY  09/24/2010   CORONARY ANGIOPLASTY  09/25/1995   ramus   ESOPHAGOGASTRODUODENOSCOPY N/A 02/11/2017   Procedure: ESOPHAGOGASTRODUODENOSCOPY (EGD);  Surgeon: Deveron Fly, MD;  Location: [facility] ENDOSCOPY;  Service: Endoscopy;  Laterality: N/A;   INGUINAL HERNIA REPAIR Left 1977   INSERT / REPLACE / REMOVE PACEMAKER  12/23/2005   Medtronic [id number] - placed in [place]    INSERTION OF MESH Left 06/08/2022   Procedure: INSERTION OF MESH;  Surgeon: Flynn Hylan, MD;  Location: [facility] ORS;  Service: General;  Laterality: Left;   MANDIBLE SURGERY  1966   PACEMAKER GENERATOR CHANGE N/A 11/18/2015   ROBOTIC ASSISTED LAPAROSCOPIC CHOLECYSTECTOMY  01/12/2022   TONSILLECTOMY     TOTAL HIP ARTHROPLASTY Right 2022   TOTAL SHOULDER REPLACEMENT     bilateral approx 2020 and 2021    Social History   Socioeconomic History   Marital status: Married    Spouse name: Not on file   Number of children: Not on file   Years of education: Not on file   Highest education level: Not on file  Occupational History   Not on file  Tobacco Use   Smoking status: Never   Smokeless tobacco: Never  Vaping Use   Vaping status: Never Used  Substance and Sexual Activity   Alcohol use: Yes    Comment: 2 beers monthly, mixed drink every 2 months   Drug use: No   Sexual activity: Not on file  Other Topics Concern   Not on file  Social History Narrative   Not on file   Social Drivers of Health   Financial Resource Strain: Low Risk  (01/08/2022)   Received from [facility], [facility]   Overall Financial Resource Strain (CARDIA)    Difficulty of Paying Living Expenses: Not hard at all  Food Insecurity: No Food Insecurity (01/08/2022)   Received from [facility], [facility]   Hunger Vital Sign    Worried About Running  Out of Food in the Last Year: Never true    Ran Out of Food in the Last Year: Never true  Transportation Needs: No Transportation Needs (01/08/2022)   Received from [facility], [facility]   [facility] - Transportation    Lack of Transportation (Medical): No    Lack of Transportation (Non-Medical): No  Physical Activity: Not on file  Stress: Not on file  Social Connections: Not on file  Intimate Partner Violence: Not on file     Allergies  Allergen Reactions   Amoxicillin Rash and Itching   Bactrim [Sulfamethoxazole-Trimethoprim] Rash     CBC    Component Value Date/Time   WBC 5.3 10/31/2023 0750   RBC 4.66 10/31/2023 0750   HGB 15.5 10/31/2023 0750   HGB 14.1 03/14/2021 1117   HCT 46.5 10/31/2023 0750   HCT 41.5 03/14/2021 1117   PLT 158 10/31/2023 0750   PLT 167 03/14/2021 1117   MCV 99.8 10/31/2023 0750   MCV 98 (H) 03/14/2021 1117   MCV 96 09/22/2014 1310   MCH 33.3 (H) 10/31/2023 0750   MCHC 33.3 10/31/2023 0750   RDW 12.8 10/31/2023 0750   RDW 12.9 03/14/2021 1117   RDW 13.7 09/22/2014 1310   LYMPHSABS 1,312 04/15/2023 0800   LYMPHSABS 1.1 03/14/2021 1117   LYMPHSABS 1.1 09/22/2014 1310   MONOABS 0.7 08/21/2021 1343   MONOABS 0.7 09/22/2014 1310   EOSABS 387 10/31/2023 0750   EOSABS 0.6 (H)  03/14/2021 1117   EOSABS 0.4 09/22/2014 1310   BASOSABS 32 10/31/2023 0750   BASOSABS 0.0 03/14/2021 1117   BASOSABS 0.1 09/22/2014 1310    Pulmonary Functions Testing Results:    Latest Ref Rng & Units 12/13/2022    1:47 PM  PFT Results  FVC-Pre L 2.89   FVC-Predicted Pre % 71   FVC-Post L 3.12   FVC-Predicted Post % 77   Pre FEV1/FVC % % 78   Post FEV1/FCV % % 77   FEV1-Pre L 2.24   FEV1-Predicted Pre % 79   FEV1-Post L 2.40   DLCO uncorrected ml/min/mmHg 12.14   DLCO UNC% % 49   DLVA Predicted % 58   TLC L 5.65   TLC % Predicted % 77   RV % Predicted % 87     Outpatient Medications Prior to Visit  Medication Sig Dispense Refill   acetaminophen   (TYLENOL ) 500 MG tablet Take 500 mg by mouth every 8 (eight) hours as needed.     albuterol  (VENTOLIN  HFA) 108 (90 Base) MCG/ACT inhaler Inhale 2 puffs into the lungs every 6 (six) hours as needed for wheezing or shortness of breath. 8 g 3   amiodarone  (PACERONE ) 100 MG tablet Take 2 tablets (200 mg total) by mouth daily for 30 days, THEN 1 tablet (100 mg total) daily. 180 tablet 2   apixaban  (ELIQUIS ) 5 MG TABS tablet Take 1 tablet (5 mg total) by mouth 2 (two) times daily. 180 tablet 3   bisoprolol  (ZEBETA ) 5 MG tablet Take 1 tablet (5 mg total) by mouth daily. 90 tablet 3   [employer]S ZINC  50 MG TABS Take 50 mg by mouth daily.     finasteride  (PROSCAR ) 5 MG tablet Take 1 tablet (5 mg total) by mouth daily. 90 tablet 1   fluticasone  (FLONASE ) 50 MCG/ACT nasal spray Place 2 sprays into both nostrils 2 (two) times daily. 18 mL 3   furosemide  (LASIX ) 40 MG tablet Take 3 tablets (120 mg total) by mouth daily. 3 tablets daily alternating with 3 tablets twice daily. 390 tablet 3   lidocaine  (LIDODERM ) 5 % Place 1 patch onto the skin daily. Remove & Discard patch within 12 hours or as directed by MD 30 patch 0   lovastatin  (MEVACOR ) 20 MG tablet Take 1 tablet (20 mg total) by mouth at bedtime. 90 tablet 3   nitroGLYCERIN  (NITROSTAT ) 0.4 MG SL tablet Place 1 tablet (0.4 mg total) under the tongue every 5 (five) minutes as needed for chest pain. 25 tablet 1   potassium chloride  SA (KLOR-CON  M) 20 MEQ tablet Take 1 tablet (20 mEq total) by mouth daily. Take with lasix  90 tablet 3   sertraline  (ZOLOFT ) 100 MG tablet TAKE 1 TABLET BY MOUTH EVERY DAY 90 tablet 1   tamsulosin  (FLOMAX ) 0.4 MG CAPS capsule Take 2 capsules (0.8 mg total) by mouth daily after breakfast. 180 capsule 3   TRELEGY ELLIPTA  100-62.5-25 MCG/ACT AEPB Inhale 1 puff into the lungs daily.     erythromycin  ophthalmic ointment Place 1 Application into both eyes 3 (three) times daily. (Patient not taking: Reported on 02/28/2024) 3.5 g 0   No  facility-administered medications prior to visit.

## 2024-03-02 ENCOUNTER — Other Ambulatory Visit: Payer: Self-pay | Admitting: Cardiovascular Disease

## 2024-03-02 NOTE — Telephone Encounter (Signed)
 Prescription refill request for Eliquis  received. Indication:AFIB Last office visit:5/25 Scr:0.88  2/25 Age: 88 Weight:78.5  kg  Prescription refilled

## 2024-03-11 NOTE — Progress Notes (Signed)
 Remote pacemaker transmission.

## 2024-03-12 NOTE — Progress Notes (Unsigned)
 Cardiology Clinic Note   Date: 03/12/2024 ID: AH BOTT, DOB July 24, 1936, MRN [id number]  [facility] Providers Cardiologist:  Belva Boyden, MD Electrophysiologist:  Ardeen Kohler, MD   Chief Complaint   Kyle Morales is a 88 y.o. male who presents to the clinic today for ***  Patient Profile   Kyle Morales is followed by Dr. Gollan and Dr. Daneil Dunker for the history outlined below.      Past medical history significant for: CAD. Stent placement to ramus 1997 performed at [facility]. Chronic diastolic heart failure. Echo 10/11/2022: EF 50 to 55%.  No RWMA.  Moderate to severe LVH mid ventricular extending distally, severe in the apical region with near cavity obliteration.  Indeterminate diastolic parameters.  Normal RV size/function.  Mildly elevated PA pressure, RVSP 36.1 mmHg.  Severe LAE.  Mild MR.  Borderline dilatation of ascending aorta 37 mm. PAF. NSVT. Heart block. S/p PPM placement 2007 performed in [place] . Generator change out February 2017 at [facility]. In-clinic device check 02/18/2024: Presenting rhythm AV paced V sensed.  Several brief episodes of NSVT and one-to-one atrial driven tachycardia.  AT AF burden 0.8%. Hypertension. Hyperlipidemia. GERD. COPD.  In summary, patient establish care with Dr. Gollan on 03/12/2013.  He has a history of A-fib dating back to 89.  He reported a history of CAD with stent placement in 1997 performed at [facility].  He had a permanent pacemaker placed in [place]  in 2007 for heart block.  He reported normal EF at that time.  Last EP evaluation in December 2013 at [facility] demonstrated 3.1% A-fib burden (increased from 1.4%).  Nuclear stress test October 2015 was a low risk study with no significant ischemia.  Echo in June 2018 demonstrated EF 50 to 55%, no RWMA, aortic root 39 mm, mild to moderate MR, severe LAE, technically difficult study unable to evaluate LV diastolic function.  Patient establish care with EP in May 2018 to follow  pacemaker.  Patient was last seen in the office by Dr. Gollan on 02/11/2024 for routine follow-up.  He reported worsening dyspnea and edema.  It was recommended he increase Lasix  220 mg twice a day for 3 days then add 120 mg of Lasix  in the afternoon every other day in addition to his regular morning dose.  Plan to consider Myoview if no improvement in shortness of breath.  Patient was seen by Dr. Daneil Dunker on 02/18/2024 for increased episodes of A-fib per pacemaker check.  He reported worsening shortness of breath when sitting and watching TV that worsened with physical activity.  Given severe diastolic dysfunction it was felt patient was unlikely to tolerate lack of atrial contractility and A-fib and rhythm control was initiated.  He was started on amiodarone .     History of Present Illness    Today, patient ***  CAD Stent placement to ramus 1997 performed at [facility].  Patient*** - Continue bisoprolol , as needed SL NTG.  Patient not on aspirin secondary to Eliquis .  Chronic diastolic heart failure Echo January 2024 demonstrated EF 50 to 55%, moderate to severe LVH mid ventricular extending distally, severe in the apical region with near cavity obliteration, indeterminate diastolic parameters, normal RV size/function, mildly elevated PA pressure, severe LAE, mild MR.  Patient*** Euvolemic and well compensated on exam. - Continue bisoprolol , Lasix .  PAF Onset dating back to 72.  Patient with recent increase in A-fib burden and started on amiodarone  late May 2025.  Denies spontaneous bleeding concerns.  Patient*** -Continue bisoprolol , amiodarone , Eliquis . Appropriate  Eliquis  dose.  Heart block S/p PPM placement 2007 in [place] , GEN change out February 2017 at [facility].  In clinic device check May 2025 as detailed in patient profile. - Continue to follow with EP.  ROS: All other systems reviewed and are otherwise negative except as noted in History of Present Illness.  EKGs/Labs Reviewed         10/31/2023: ALT 12; AST 15; BUN 12; Creat 0.88; Potassium 3.7; Sodium 143   10/31/2023: Hemoglobin 15.5; WBC 5.3   02/18/2024: TSH 3.830   No results found for requested labs within last 365 days.  ***  Risk Assessment/Calculations    {Does this patient have ATRIAL FIBRILLATION?:[phone]} No BP recorded.  {Refresh Note OR Click here to enter BP  :1}***        Physical Exam    VS:  There were no vitals taken for this visit. , BMI There is no height or weight on file to calculate BMI.  GEN: Well nourished, well developed, in no acute distress. Neck: No JVD or carotid bruits. Cardiac: *** RRR. *** No murmur. No rubs or gallops.   Respiratory:  Respirations regular and unlabored. Clear to auscultation without rales, wheezing or rhonchi. GI: Soft, nontender, nondistended. Extremities: Radials/DP/PT 2+ and equal bilaterally. No clubbing or cyanosis. No edema ***  Skin: Warm and dry, no rash. Neuro: Strength intact.  Assessment & Plan   ***  Disposition: ***     {Are you ordering a CV Procedure (e.g. stress test, cath, DCCV, TEE, etc)?   Press F2        :[id number]}   Signed, Lonell Rives. Kazi Reppond, DNP, NP-C

## 2024-03-16 ENCOUNTER — Encounter: Payer: Self-pay | Admitting: Student

## 2024-03-16 ENCOUNTER — Ambulatory Visit: Attending: Student | Admitting: Student

## 2024-03-16 VITALS — BP 110/82 | HR 96 | Ht 71.0 in | Wt 171.0 lb

## 2024-03-16 DIAGNOSIS — R0789 Other chest pain: Secondary | ICD-10-CM | POA: Insufficient documentation

## 2024-03-16 DIAGNOSIS — I459 Conduction disorder, unspecified: Secondary | ICD-10-CM | POA: Diagnosis present

## 2024-03-16 DIAGNOSIS — I480 Paroxysmal atrial fibrillation: Secondary | ICD-10-CM | POA: Diagnosis not present

## 2024-03-16 DIAGNOSIS — Z950 Presence of cardiac pacemaker: Secondary | ICD-10-CM | POA: Diagnosis present

## 2024-03-16 DIAGNOSIS — I5032 Chronic diastolic (congestive) heart failure: Secondary | ICD-10-CM | POA: Diagnosis present

## 2024-03-16 DIAGNOSIS — R0609 Other forms of dyspnea: Secondary | ICD-10-CM | POA: Insufficient documentation

## 2024-03-16 DIAGNOSIS — I2510 Atherosclerotic heart disease of native coronary artery without angina pectoris: Secondary | ICD-10-CM | POA: Diagnosis present

## 2024-03-16 NOTE — Patient Instructions (Signed)
 Medication Instructions:  Your physician recommends that you continue on your current medications as directed. Please refer to the Current Medication list given to you today.   *If you need a refill on your cardiac medications before your next appointment, please call your pharmacy*  Lab Work: No labs ordered today  If you have labs (blood work) drawn today and your tests are completely normal, you will receive your results only by: MyChart Message (if you have MyChart) OR A paper copy in the mail If you have any lab test that is abnormal or we need to change your treatment, we will call you to review the results.  Testing/Procedures: Your physician has requested that you have an echocardiogram. Echocardiography is a painless test that uses sound waves to create images of your heart. It provides your doctor with information about the size and shape of your heart and how well your heart's chambers and valves are working.   You may receive an ultrasound enhancing agent through an IV if needed to better visualize your heart during the echo. This procedure takes approximately one hour.  There are no restrictions for this procedure.  This will take place at [address] [facility] [address], [place] [address]  Please note: We ask at that you not bring children with you during ultrasound (echo/ vascular) testing. Due to room size and safety concerns, children are not allowed in the ultrasound rooms during exams. Our front office staff cannot provide observation of children in our lobby area while testing is being conducted. An adult accompanying a patient to their appointment will only be allowed in the ultrasound room at the discretion of the ultrasound technician under special circumstances. We apologize for any inconvenience.      Please report to Radiology at [facility] Main Entrance, medical mall, 30 mins prior to your test.  [address]  [place], [place]  How to Prepare for Your Cardiac PET/CT Stress Test:  Nothing to eat or drink, except water, 3 hours prior to arrival time.  NO caffeine/decaffeinated products, or chocolate 12 hours prior to arrival. (Please note decaffeinated beverages (teas/coffees) still contain caffeine).  If you have caffeine within 12 hours prior, the test will need to be rescheduled.  Medication instructions: Do not take erectile dysfunction medications for 72 hours prior to test (sildenafil, tadalafil) Do not take nitrates (isosorbide mononitrate, Ranexa) the day before or day of test Do not take tamsulosin  the day before or morning of test Hold theophylline containing medications for 12 hours. Hold Dipyridamole 48 hours prior to the test.  Diabetic Preparation: If able to eat breakfast prior to 3 hour fasting, you may take all medications, including your insulin. Do not worry if you miss your breakfast dose of insulin - start at your next meal. If you do not eat prior to 3 hour fast-Hold all diabetes (oral and insulin) medications. Patients who wear a continuous glucose monitor MUST remove the device prior to scanning.  You may take your remaining medications with water.  NO perfume, cologne or lotion on chest or abdomen area. FEMALES - Please avoid wearing dresses to this appointment.  Total time is 1 to 2 hours; you may want to bring reading material for the waiting time.  In preparation for your appointment, medication and supplies will be purchased.  Appointment availability is limited, so if you need to cancel or reschedule, please call the Radiology Department Scheduler at [phone] 24 hours in advance to  avoid a cancellation fee of $100.00  What to Expect When you Arrive:  Once you arrive and check in for your appointment, you will be taken to a preparation room within the Radiology Department.  A technologist or Nurse will obtain your medical history, verify that you are  correctly prepped for the exam, and explain the procedure.  Afterwards, an IV will be started in your arm and electrodes will be placed on your skin for EKG monitoring during the stress portion of the exam. Then you will be escorted to the PET/CT scanner.  There, staff will get you positioned on the scanner and obtain a blood pressure and EKG.  During the exam, you will continue to be connected to the EKG and blood pressure machines.  A small, safe amount of a radioactive tracer will be injected in your IV to obtain a series of pictures of your heart along with an injection of a stress agent.    After your Exam:  It is recommended that you eat a meal and drink a caffeinated beverage to counter act any effects of the stress agent.  Drink plenty of fluids for the remainder of the day and urinate frequently for the first couple of hours after the exam.  Your doctor will inform you of your test results within 7-10 business days.  For more information and frequently asked questions, please visit our website: [email or web address]  For questions about your test or how to prepare for your test, please call: Cardiac Imaging Nurse Navigators Office: [phone]   Follow-Up: At [facility], you and your health needs are our priority.  As part of our continuing mission to provide you with exceptional heart care, our providers are all part of one team.  This team includes your primary Cardiologist (physician) and Advanced Practice Providers or APPs (Physician Assistants and Nurse Practitioners) who all work together to provide you with the care you need, when you need it.  Your next appointment:   6 month(s)  Provider:   You may see Timothy Gollan, MD or one of the following Advanced Practice Providers on your designated Care Team:   Lonni Meager, NP Lesley Maffucci, PA-C Bernardino Bring, PA-C Cadence [place], PA-C Tylene Lunch, NP Barnie Hila, NP    We recommend signing up  for the patient portal called MyChart.  Sign up information is provided on this After Visit Summary.  MyChart is used to connect with patients for Virtual Visits (Telemedicine).  Patients are able to view lab/test results, encounter notes, upcoming appointments, etc.  Non-urgent messages can be sent to your provider as well.   To learn more about what you can do with MyChart, go to [email or web address].

## 2024-03-17 ENCOUNTER — Ambulatory Visit (INDEPENDENT_AMBULATORY_CARE_PROVIDER_SITE_OTHER)

## 2024-03-17 DIAGNOSIS — I459 Conduction disorder, unspecified: Secondary | ICD-10-CM

## 2024-03-20 ENCOUNTER — Telehealth: Payer: Self-pay | Admitting: Cardiology

## 2024-03-20 NOTE — Telephone Encounter (Signed)
 Spoke to patients wife to advise that the report transmission was received. Was appreciative of call back.

## 2024-03-20 NOTE — Telephone Encounter (Signed)
 Patient's spouse is calling to confirm the transmission sent today was received. Please advise.

## 2024-03-23 LAB — CUP PACEART REMOTE DEVICE CHECK
Battery Remaining Longevity: 19 mo
Battery Voltage: 2.91 V
Brady Statistic AP VP Percent: 0.73 %
Brady Statistic AP VS Percent: 98.23 %
Brady Statistic AS VP Percent: 0.05 %
Brady Statistic AS VS Percent: 1 %
Brady Statistic RA Percent Paced: 98.68 %
Brady Statistic RV Percent Paced: 0.87 %
Date Time Interrogation Session: 20250627134123
Implantable Lead Connection Status: 753985
Implantable Lead Connection Status: 753985
Implantable Lead Implant Date: 20070411
Implantable Lead Implant Date: 20070411
Implantable Lead Location: 753859
Implantable Lead Location: 753860
Implantable Lead Model: 5076
Implantable Lead Model: 5076
Implantable Pulse Generator Implant Date: 20170224
Lead Channel Impedance Value: 323 Ohm
Lead Channel Impedance Value: 342 Ohm
Lead Channel Impedance Value: 380 Ohm
Lead Channel Impedance Value: 456 Ohm
Lead Channel Pacing Threshold Amplitude: 0.75 V
Lead Channel Pacing Threshold Amplitude: 1.125 V
Lead Channel Pacing Threshold Pulse Width: 0.4 ms
Lead Channel Pacing Threshold Pulse Width: 0.4 ms
Lead Channel Sensing Intrinsic Amplitude: 1.625 mV
Lead Channel Sensing Intrinsic Amplitude: 1.625 mV
Lead Channel Sensing Intrinsic Amplitude: 5.375 mV
Lead Channel Sensing Intrinsic Amplitude: 5.375 mV
Lead Channel Setting Pacing Amplitude: 2 V
Lead Channel Setting Pacing Amplitude: 3.25 V
Lead Channel Setting Pacing Pulse Width: 0.4 ms
Lead Channel Setting Sensing Sensitivity: 0.9 mV
Zone Setting Status: 755011

## 2024-03-24 ENCOUNTER — Other Ambulatory Visit: Payer: Self-pay | Admitting: Student

## 2024-03-24 DIAGNOSIS — F419 Anxiety disorder, unspecified: Secondary | ICD-10-CM

## 2024-03-25 MED ORDER — SERTRALINE HCL 100 MG PO TABS: 100.0000 mg | ORAL_TABLET | Freq: Every day | ORAL | 1 refills | Status: DC

## 2024-03-25 NOTE — Addendum Note (Signed)
 Addended by: Elizer Bostic on: 03/25/2024 12:21 AM   Modules accepted: Orders

## 2024-03-29 ENCOUNTER — Ambulatory Visit: Payer: Self-pay | Admitting: Cardiology

## 2024-03-31 ENCOUNTER — Ambulatory Visit

## 2024-03-31 DIAGNOSIS — R0609 Other forms of dyspnea: Secondary | ICD-10-CM | POA: Insufficient documentation

## 2024-03-31 LAB — ECHOCARDIOGRAM COMPLETE
AR max vel: 3.38 cm2
AV Area VTI: 3.09 cm2
AV Area mean vel: 3.2 cm2
AV Mean grad: 2 mmHg
AV Peak grad: 4.3 mmHg
Ao pk vel: 1.04 m/s
S' Lateral: 3.11 cm

## 2024-04-01 ENCOUNTER — Ambulatory Visit: Payer: Self-pay | Admitting: Student

## 2024-04-01 NOTE — Telephone Encounter (Signed)
 Called and spoke with the patient to relay ECHO results per Barnie Hila, NP.  Patient appreciative of the call and expressed gratitude for the information.  Patient verbalized understanding of the results relayed with no questions or concerns expressed at this time.  Advised the patient about his next upcoming cardiology appointment Chantal Needle, NP on 05/20/24.

## 2024-04-01 NOTE — Telephone Encounter (Signed)
-----   Message from Barnie Hila sent at 04/01/2024 10:25 AM EDT ----- Please let patient know echo is improved from previous echo. Pumping function is normal. Normal pressure coming from the lungs into the heart. Mild backflow of blood through the mitral valve. Aortic  valve is stiff but not narrowed.   Thank you!  DW  ----- Message ----- From: Interface, [employer] One Seven Sent: 03/31/2024   7:17 PM EDT To: Barnie Hila, NP

## 2024-04-10 ENCOUNTER — Ambulatory Visit (INDEPENDENT_AMBULATORY_CARE_PROVIDER_SITE_OTHER): Admitting: Podiatry

## 2024-04-10 ENCOUNTER — Encounter: Payer: Self-pay | Admitting: Podiatry

## 2024-04-10 DIAGNOSIS — I739 Peripheral vascular disease, unspecified: Secondary | ICD-10-CM

## 2024-04-10 DIAGNOSIS — M79675 Pain in left toe(s): Secondary | ICD-10-CM

## 2024-04-10 DIAGNOSIS — M79674 Pain in right toe(s): Secondary | ICD-10-CM | POA: Diagnosis not present

## 2024-04-10 DIAGNOSIS — B351 Tinea unguium: Secondary | ICD-10-CM

## 2024-04-16 ENCOUNTER — Ambulatory Visit
Admission: RE | Admit: 2024-04-16 | Discharge: 2024-04-16 | Disposition: A | Discharge status: Home / Self Care | Source: Ambulatory Visit | Attending: Student | Admitting: Student

## 2024-04-16 DIAGNOSIS — R0609 Other forms of dyspnea: Secondary | ICD-10-CM | POA: Diagnosis not present

## 2024-04-16 DIAGNOSIS — R911 Solitary pulmonary nodule: Secondary | ICD-10-CM | POA: Diagnosis not present

## 2024-04-16 LAB — NM PET CT CARDIAC PERFUSION MULTI W/ABSOLUTE BLOODFLOW
LV dias vol: 108 mL (ref 62–150)
LV sys vol: 48 mL (ref 4.2–5.8)
Nuc Rest EF: 50 %
Nuc Stress EF: 56 %
Peak HR: 70 {beats}/min
Rest HR: 75 {beats}/min
Rest Nuclear Isotope Dose: 20 mCi
SRS: 5
SSS: 2
Stress Nuclear Isotope Dose: 20 mCi
TID: 1.09

## 2024-04-16 MED ORDER — RUBIDIUM RB82 GENERATOR (RUBYFILL)
25.0000 | PACK | Freq: Once | INTRAVENOUS | Status: AC
  Administered 2024-04-16: 20.04 via INTRAVENOUS

## 2024-04-16 MED ORDER — REGADENOSON 0.4 MG/5ML IV SOLN
INTRAVENOUS | Status: AC
  Filled 2024-04-16: qty 5

## 2024-04-16 MED ORDER — RUBIDIUM RB82 GENERATOR (RUBYFILL)
25.0000 | PACK | Freq: Once | INTRAVENOUS | Status: AC
  Administered 2024-04-16: 20 via INTRAVENOUS

## 2024-04-16 MED ORDER — REGADENOSON 0.4 MG/5ML IV SOLN
0.4000 mg | Freq: Once | INTRAVENOUS | Status: AC
  Administered 2024-04-16: 0.4 mg via INTRAVENOUS
  Filled 2024-04-16: qty 5

## 2024-04-16 NOTE — Progress Notes (Signed)
 Patient presents for a cardiac PET stress test and tolerated procedure without incident. Patient maintained acceptable vital signs throughout the test and was offered caffeine after test.  Patient ambulated out of department with a steady gait.

## 2024-04-17 ENCOUNTER — Encounter: Payer: Self-pay | Admitting: Podiatry

## 2024-04-17 NOTE — Progress Notes (Signed)
  Subjective:  Patient ID: Kyle Morales, male    DOB: [birth year],  MRN: [id number]  Kyle Morales presents to clinic today for at risk foot care. Patient has h/o PAD and painful thick toenails that are difficult to trim. Pain interferes with ambulation. Aggravating factors include wearing enclosed shoe gear. Pain is relieved with periodic professional debridement.  Chief Complaint  Patient presents with   RFC    Rm1 RFC Not diabetic/ Dr. Abdul Jan 24, 2024   New problem(s): None.   PCP is Abdul Fine, MD.  Allergies  Allergen Reactions   Amoxicillin Rash and Itching   Bactrim [Sulfamethoxazole-Trimethoprim] Rash    Review of Systems: Negative except as noted in the HPI.  Objective: There were no vitals filed for this visit. Kyle Morales is a pleasant 88 y.o. male WD, WN in NAD. AAO x 3.  Vascular Examination: CFT <3 seconds b/l. DP pulses 1/4 b/l. PT pulses 0/4 b/l. Digital hair absent. Skin temperature gradient warm to warm b/l. No pain with calf compression. No ischemia or gangrene. No cyanosis or clubbing noted b/l.    Neurological Examination: Sensation grossly intact b/l with 10 gram monofilament. Vibratory sensation intact b/l.   Dermatological Examination: Pedal skin warm and supple b/l. No open wounds b/l. No interdigital macerations. Toenails 1-5 b/l thick, discolored, elongated with subungual debris and pain on dorsal palpation.  No hyperkeratotic nor porokeratotic lesions present on today's visit.  Musculoskeletal Examination: Muscle strength 5/5 to all lower extremity muscle groups bilaterally. No pain, crepitus or joint limitation noted with ROM b/l LE. Hammertoe(s) 1-5 b/l.Kyle Morales Patient ambulates with cane assistance.  Radiographs: None  Last HgA1c:      Latest Ref Rng & Units 10/31/2023    7:50 AM  Hemoglobin A1C  Hemoglobin-A1c <5.7 % of total Hgb 5.8    Assessment/Plan: 1. Pain due to onychomycosis of toenails of both feet   2. Peripheral vascular  disease with claudication [facility])    Patient was evaluated and treated. All patient's and/or POA's questions/concerns addressed on today's visit. Mycotic toenails 1-5 debrided in length and girth without incident. Continue soft, supportive shoe gear daily. Report any pedal injuries to medical professional. Call office if there are any quesitons/concerns. -Patient/POA to call should there be question/concern in the interim.   Return in about 9 weeks (around 06/12/2024).  Kyle Morales, DPM      [place] LOCATION: [address] [place] [address]                   Office [phone]   [facility] LOCATION: [address] [place], [place] [address] Office [phone]

## 2024-04-20 ENCOUNTER — Telehealth: Payer: Self-pay | Admitting: Emergency Medicine

## 2024-04-20 NOTE — Telephone Encounter (Signed)
 Called and spoke with the patient and his wife regarding the results for the PET stress test as interpreted by Barnie Hila, NP. Patient verbalized understanding with all questions and concerns addressed at this time.

## 2024-04-28 ENCOUNTER — Ambulatory Visit (INDEPENDENT_AMBULATORY_CARE_PROVIDER_SITE_OTHER): Payer: Medicare Other

## 2024-04-28 DIAGNOSIS — I495 Sick sinus syndrome: Secondary | ICD-10-CM

## 2024-04-29 ENCOUNTER — Encounter: Payer: Self-pay | Admitting: Student

## 2024-04-29 ENCOUNTER — Ambulatory Visit: Admitting: Student

## 2024-04-29 VITALS — BP 118/76 | HR 82 | Temp 97.4°F | Ht 71.0 in | Wt 166.0 lb

## 2024-04-29 DIAGNOSIS — I5032 Chronic diastolic (congestive) heart failure: Secondary | ICD-10-CM

## 2024-04-29 DIAGNOSIS — I10 Essential (primary) hypertension: Secondary | ICD-10-CM

## 2024-04-29 DIAGNOSIS — E559 Vitamin D deficiency, unspecified: Secondary | ICD-10-CM

## 2024-04-29 DIAGNOSIS — R0609 Other forms of dyspnea: Secondary | ICD-10-CM

## 2024-04-29 DIAGNOSIS — E785 Hyperlipidemia, unspecified: Secondary | ICD-10-CM

## 2024-04-29 DIAGNOSIS — R911 Solitary pulmonary nodule: Secondary | ICD-10-CM

## 2024-04-29 DIAGNOSIS — R7303 Prediabetes: Secondary | ICD-10-CM | POA: Diagnosis not present

## 2024-04-29 DIAGNOSIS — I482 Chronic atrial fibrillation, unspecified: Secondary | ICD-10-CM

## 2024-04-29 DIAGNOSIS — G629 Polyneuropathy, unspecified: Secondary | ICD-10-CM

## 2024-04-29 LAB — CUP PACEART REMOTE DEVICE CHECK
Battery Remaining Longevity: 17 mo
Battery Voltage: 2.91 V
Brady Statistic AP VP Percent: 0.68 %
Brady Statistic AP VS Percent: 98.87 %
Brady Statistic AS VP Percent: 0.01 %
Brady Statistic AS VS Percent: 0.44 %
Brady Statistic RA Percent Paced: 99.33 %
Brady Statistic RV Percent Paced: 0.77 %
Date Time Interrogation Session: 20250805090505
Implantable Lead Connection Status: 753985
Implantable Lead Connection Status: 753985
Implantable Lead Implant Date: 20070411
Implantable Lead Implant Date: 20070411
Implantable Lead Location: 753859
Implantable Lead Location: 753860
Implantable Lead Model: 5076
Implantable Lead Model: 5076
Implantable Pulse Generator Implant Date: 20170224
Lead Channel Impedance Value: 342 Ohm
Lead Channel Impedance Value: 342 Ohm
Lead Channel Impedance Value: 399 Ohm
Lead Channel Impedance Value: 456 Ohm
Lead Channel Pacing Threshold Amplitude: 0.75 V
Lead Channel Pacing Threshold Amplitude: 1.5 V
Lead Channel Pacing Threshold Pulse Width: 0.4 ms
Lead Channel Pacing Threshold Pulse Width: 0.4 ms
Lead Channel Sensing Intrinsic Amplitude: 1.125 mV
Lead Channel Sensing Intrinsic Amplitude: 1.125 mV
Lead Channel Sensing Intrinsic Amplitude: 8 mV
Lead Channel Sensing Intrinsic Amplitude: 8 mV
Lead Channel Setting Pacing Amplitude: 2 V
Lead Channel Setting Pacing Amplitude: 3 V
Lead Channel Setting Pacing Pulse Width: 0.4 ms
Lead Channel Setting Sensing Sensitivity: 0.9 mV
Zone Setting Status: 755011

## 2024-04-29 NOTE — Progress Notes (Unsigned)
 [facility]: [facility] Provider: ABDUL  Code Status: Full Code Goals of Care:     04/29/2024    2:29 PM  Advanced Directives  Does Patient Have a Medical Advance Directive? Yes  Type of Advance Directive Living will  Does patient want to make changes to medical advance directive? No - Patient declined     Chief Complaint  Patient presents with   Medical Management of Chronic Issues    Medical Management of Chronic Issues. 3 Month follow up    HPI: Patient is a 88 y.o. male seen today for medical management of chronic diseases.   Discussed the use of AI scribe software for clinical note transcription with the patient, who gave verbal consent to proceed.  History of Present Illness   Kyle Morales is an 88 year old male who presents with balance issues and a persistent cough.  He experiences balance issues, describing it as feeling like a 'drunk sailor stumbling around.' No dizziness is noted. He takes furosemide , 120 mg in the morning and 40-80 mg in the afternoon, but is hesitant to take the full dose in the afternoon due to frequent urination at night.  He has a persistent cough, which he attributes to Atrovent , although he finds it helpful. No recent viral infections or fevers are reported. He uses Trelegy daily. Pulmonary function tests in March 2024 showed a normal FEV/FVC ratio, reduced lung volumes, and moderately reduced DCLO. A right lower lobe 9 mm nodule was noted in January 2025, with a follow-up CT scan recommended for January or February 2026.  He has a history of back pain, which has improved following injections from a spine specialist. He occasionally uses Tylenol  and monocaine patches for pain management, taking two Tylenol  twice a day, and sometimes an additional dose if physically active.  He has a history of alcohol use, drinking heavily during his time in [employer] but has significantly reduced his intake since 1977. He now consumes  alcohol very infrequently.  He takes bisoprolol  5 mg for atrial fibrillation, finasteride  for urinary symptoms, noting that it causes unwanted hair growth, and lovastatin  for cholesterol management.         Past Medical History:  Diagnosis Date   Aortic atherosclerosis (HCC)    Aortic root dilation (HCC)    a.) TTE [id number]: Ao root measured 39 mm.   BPH (benign prostatic hyperplasia)    CHF (congestive heart failure) (HCC)    Coronary artery disease    a.) remote PCI of the RI in 1997; stent (unknown type) placed.   Depression    Diverticulosis    Eczema    GERD (gastroesophageal reflux disease)    Hyperlipidemia    Hypertension    Inguinal hernia, left    Long term current use of anticoagulant    a.) apixaban    Nephrolithiasis    NSVT (nonsustained ventricular tachycardia) (HCC)    Osteoarthrosis    Paroxysmal A-fib (HCC)    a.) CHA2DS2VASc = 5 (age x2, CHF, HTN, vascular disease history);  b.) rate/rhythm maintained on oral metoprolol  succinate; chronically anticoagulated with apixaban    Pharyngeal dysphagia    Presence of permanent cardiac pacemaker 2007   a.) Medtronic device placed in [place]  in 2007; b.) generator changed at [facility] on 11/18/2015   RBBB    SSS (sick sinus syndrome) (HCC) 2007   a.) s/p PPM placement in [place]  in 2007    Past Surgical History:  Procedure  Laterality Date   BLEPHAROPLASTY Bilateral    CARDIAC CATHETERIZATION  09/25/1995   CARDIAC CATHETERIZATION  09/24/1998   CHOLECYSTECTOMY     COLONOSCOPY  09/24/2010   CORONARY ANGIOPLASTY  09/25/1995   ramus   ESOPHAGOGASTRODUODENOSCOPY N/A 02/11/2017   Procedure: ESOPHAGOGASTRODUODENOSCOPY (EGD);  Surgeon: Gaylyn Gladis PENNER, MD;  Location: [facility] ENDOSCOPY;  Service: Endoscopy;  Laterality: N/A;   INGUINAL HERNIA REPAIR Left 1977   INSERT / REPLACE / REMOVE PACEMAKER  12/23/2005   Medtronic [id number] - placed in [place]    INSERTION OF MESH Left 06/08/2022   Procedure: INSERTION OF MESH;   Surgeon: Lane Shope, MD;  Location: [facility] ORS;  Service: General;  Laterality: Left;   MANDIBLE SURGERY  1966   PACEMAKER GENERATOR CHANGE N/A 11/18/2015   ROBOTIC ASSISTED LAPAROSCOPIC CHOLECYSTECTOMY  01/12/2022   TONSILLECTOMY     TOTAL HIP ARTHROPLASTY Right 2022   TOTAL SHOULDER REPLACEMENT     bilateral approx 2020 and 2021    Allergies  Allergen Reactions   Amoxicillin Rash and Itching   Bactrim [Sulfamethoxazole-Trimethoprim] Rash    Outpatient Encounter Medications as of 04/29/2024  Medication Sig   acetaminophen  (TYLENOL ) 500 MG tablet Take 500 mg by mouth every 8 (eight) hours as needed.   albuterol  (VENTOLIN  HFA) 108 (90 Base) MCG/ACT inhaler Inhale 2 puffs into the lungs every 6 (six) hours as needed for wheezing or shortness of breath.   amiodarone  (PACERONE ) 100 MG tablet Take 2 tablets (200 mg total) by mouth daily for 30 days, THEN 1 tablet (100 mg total) daily.   bisoprolol  (ZEBETA ) 5 MG tablet Take 1 tablet (5 mg total) by mouth daily.   [employer] ZINC  50 MG TABS Take 50 mg by mouth daily.   ELIQUIS  5 MG TABS tablet TAKE 1 TABLET TWICE A DAY   finasteride  (PROSCAR ) 5 MG tablet Take 1 tablet (5 mg total) by mouth daily.   fluticasone  (FLONASE ) 50 MCG/ACT nasal spray Place 2 sprays into both nostrils 2 (two) times daily.   furosemide  (LASIX ) 40 MG tablet Take 3 tablets (120 mg total) by mouth daily. 3 tablets daily alternating with 3 tablets twice daily.   lidocaine  (LIDODERM ) 5 % Place 1 patch onto the skin daily. Remove & Discard patch within 12 hours or as directed by MD   lovastatin  (MEVACOR ) 20 MG tablet Take 1 tablet (20 mg total) by mouth at bedtime.   nitroGLYCERIN  (NITROSTAT ) 0.4 MG SL tablet Place 1 tablet (0.4 mg total) under the tongue every 5 (five) minutes as needed for chest pain.   potassium chloride  SA (KLOR-CON  M) 20 MEQ tablet Take 1 tablet (20 mEq total) by mouth daily. Take with lasix    sertraline  (ZOLOFT ) 100 MG tablet Take 1 tablet (100 mg total) by  mouth daily.   tamsulosin  (FLOMAX ) 0.4 MG CAPS capsule Take 2 capsules (0.8 mg total) by mouth daily after breakfast.   TRELEGY ELLIPTA  100-62.5-25 MCG/ACT AEPB Inhale 1 puff into the lungs daily.   erythromycin  ophthalmic ointment Place 1 Application into both eyes 3 (three) times daily. (Patient not taking: Reported on 04/29/2024)   No facility-administered encounter medications on file as of 04/29/2024.    Review of Systems:  Review of Systems  Health Maintenance  Topic Date Due   COVID-19 Vaccine (11 - 2024-25 season) 02/28/2024   INFLUENZA VACCINE  04/24/2024   Medicare Annual Wellness (AWV)  01/01/2025   DTaP/Tdap/Td (2 - Td or Tdap) 02/28/2033   Pneumococcal Vaccine: 50+ Years  Completed   Zoster  Vaccines- Shingrix  Completed   Hepatitis B Vaccines  Aged Out   HPV VACCINES  Aged Out   Meningococcal B Vaccine  Aged Out    Physical Exam: Vitals:   04/29/24 1425  BP: 118/76  Pulse: 82  Temp: (!) 97.4 F (36.3 C)  SpO2: 95%  Weight: 166 lb (75.3 kg)  Height: 5' 11 (1.803 m)   Body mass index is 23.15 kg/m. Physical Exam Physical Exam   CHEST: Lungs clear to auscultation. EXTREMITIES: Good pulses bilaterally. NEUROLOGICAL: Decreased sensation in left foot, better on right. Unsteady balance with arms outstretched. Balance maintained with eyes closed.      Labs reviewed: Basic Metabolic Panel: Recent Labs    06/05/23 1015 10/31/23 0750 02/18/24 0953 04/30/24 0735  NA  --  143  --  143  K  --  3.7  --  3.3*  CL  --  105  --  104  CO2  --  30  --  33*  GLUCOSE  --  101*  --  105*  BUN  --  12  --  18  CREATININE 1.00 0.88  --  1.03  CALCIUM  --  8.9  --  9.2  TSH  --   --  3.830 6.77*   Liver Function Tests: Recent Labs    10/31/23 0750 04/30/24 0735  AST 15 21  ALT 12 15  BILITOT 0.7 0.9  PROT 6.4 6.6   No results for input(s): LIPASE, AMYLASE in the last 8760 hours. No results for input(s): AMMONIA in the last 8760 hours. CBC: Recent Labs     09/09/23 0738 10/31/23 0750 04/30/24 0735  WBC 6.2 5.3 5.5  NEUTROABS 3,720 2,979 3,157  HGB 15.4 15.5 14.7  HCT 45.6 46.5 44.1  MCV 98.7 99.8 103.0*  PLT 168 158 162   Lipid Panel: Recent Labs    04/30/24 0735  CHOL 129  HDL 40  LDLCALC 68  TRIG 128  CHOLHDL 3.2   Lab Results  Component Value Date   HGBA1C 5.8 (H) 10/31/2023    Procedures since last visit: CUP PACEART REMOTE DEVICE CHECK Result Date: 04/29/2024 PPM scheduled remote reviewed. Normal device function.  Presenting rhythm: AP-VS 3 NSVT events.  V-rates 167-170bpm. Longest x 19 beats.  All V>A. Next remote 91 days. AB, CVRS  NM PET CT CARDIAC PERFUSION MULTI W/ABSOLUTE BLOODFLOW Result Date: 04/16/2024   LV perfusion is normal.   Rest left ventricular function is normal. Rest EF: 50%. Stress left ventricular function is normal. Stress EF: 56%. End diastolic cavity size is normal. End systolic cavity size is normal.   Myocardial blood flow reserve is not reported in this patient due to technical or patient-specific concerns that affect accuracy.   Coronary calcium assessment not performed due to prior revascularization.   The study is normal. The study is low risk.   Electronically signed by: Lonni Hanson, MD. CLINICAL DATA:  This over-read does not include interpretation of cardiac or coronary anatomy or pathology. The Cardiac PET CT interpretation by the cardiologist is attached. COMPARISON:  10/18/2023 chest CT FINDINGS: Right hemidiaphragm elevation No pleural fluid. Minimal motion degradation Right greater than left lung base volume loss and scarring. 9 mm pleural-based pulmonary nodule is suboptimally evaluated secondary to motion including on 27/4 but likely decreased compared to 10/18/2023 diagnostic chest CT. Aortic athe.  Rosclerosis. Pacer/ICD No imaged thoracic adenopathy. Bilobar hepatic cysts. Irregular hepatic capsule. Normal imaged portions of the spleen, stomach, pancreas, adrenal glands. Mild  bilateral gynecomastia Old right rib fractures. Thoracic spondylosis and moderate to marked S shaped thoracic spine curvature. IMPRESSION: No acute findings in the imaged extracardiac chest. Motion degradation Right lower lobe 9 mm pulmonary nodule was detailed on 10/18/2023 CT. Better evaluated on that exam. Please see that report for follow-up recommendations. Irregular hepatic capsule suggests cirrhosis. Electronically Signed   By: Rockey Kilts M.D.   On: 04/16/2024 15:38  Results   RADIOLOGY CT chest: Right lower lobe 9 mm nodule noted. No acute findings. (10/2023) PET CT scan: Heart scan. (04/16/2024) Kidney stone assessment: Nonobstructive kidney stone. (04/08/2024)  DIAGNOSTIC PFTs: Normal FEV1/FVC ratio. Lung volumes reduced to 77% predicted. DLCO moderately reduced at 0.9% predicted. (11/2022) Perfusion study: Normal perfusion of the left ventricle. Ejection fraction 50% at rest, 56% with stress. End-diastolic cavity normal size. Myocardial blood flow reserve not reported. Coronary calcium assessment not performed due to revascularization.      Assessment/Plan     Peripheral neuropathy Ongoing balance difficulties likely due to neuropathy. Decreased sensation in the feet, more pronounced on the left side. Neuropathy may be related to age, past alcohol use, or vitamin deficiencies. No diabetes present. - Order full range of labs including B12 and vitamin D  levels - Consider neurologist referral if labs are inconclusive  Chronic cough in the setting of restrictive lung disease Chronic cough persisting for several months. Pulmonology confirmed no COPD. Recent PFTs showed a restrictive pattern, likely age-related. No recent viral infections or pneumonia symptoms. - Continue Trelegy as per pulmonology recommendation - Use incentive spirometry - Order labs to check for electrolyte imbalances - Consider chest x-ray if labs are inconclusive  Atrial fibrillation Atrial fibrillation  managed with bisoprolol . Heart rate well controlled around 70-80 bpm. Discussion about potential shortness of breath related to beta blocker use. - Continue bisoprolol  5 mg daily - Monitor heart rate and symptoms  Heart failure with preserved ejection fraction Heart failure with preserved ejection fraction. Recent perfusion study showed normal left ventricle perfusion and ejection fraction. No acute findings on imaging. - Order BNP and electrolyte labs to assess fluid status  Lower extremity edema Lower extremity edema present, managed with furosemide . Reports hesitancy in taking full prescribed dose due to nocturia. - Continue furosemide  with adjusted dosing as tolerated  Chronic back pain Chronic back pain managed with Tylenol  and occasional use of monocaine patches. Pain is generally well controlled with current regimen. - Continue current pain management regimen with Tylenol  and monocaine patches as needed  Benign prostatic hyperplasia with lower urinary tract symptoms Benign prostatic hyperplasia managed with finasteride . Reports increased hair growth as a side effect. - Continue finasteride  for urinary symptoms        Labs/tests ordered:  * No order type specified * Next appt:  08/05/2024

## 2024-04-29 NOTE — Patient Instructions (Signed)
 VISIT SUMMARY:  During your visit, we discussed your balance issues, chronic cough, and other health concerns. We reviewed your symptoms, current medications, and recent test results to develop a comprehensive plan to address your health needs.  YOUR PLAN:  -PERIPHERAL NEUROPATHY CONTRIBUTING TO BALANCE IMPAIRMENT: Peripheral neuropathy is a condition where the nerves outside of your brain and spinal cord are damaged, leading to symptoms like decreased sensation and balance issues. We will order a full range of labs, including B12 and vitamin D  levels, to check for any deficiencies. If the lab results show any abnormalities, we may refer you to a neurologist. We also discussed the potential use of medications like gabapentin  or Lyrica to help manage neuropathic pain.  -CHRONIC COUGH IN THE SETTING OF RESTRICTIVE LUNG DISEASE: A chronic cough can be a symptom of restrictive lung disease, which means your lungs are not able to expand fully. We will continue your current treatment with Trelegy and encourage the use of incentive spirometry to help improve lung function. We will also order a full range of labs to check for any abnormalities. If the lab results are normal and your symptoms persist, we may consider a chest x-ray.  -ATRIAL FIBRILLATION AND HEART FAILURE WITH PRESERVED EJECTION FRACTION: Atrial fibrillation is an irregular and often rapid heart rate that can lead to poor blood flow. Your heart rate is well controlled with bisoprolol , and we will continue this medication as prescribed. We will monitor your heart rate and symptoms to ensure they remain stable.  -LOWER EXTREMITY EDEMA: Edema is swelling caused by excess fluid trapped in your body's tissues. Your edema is being managed with furosemide , but we are mindful of the nocturia (nighttime urination) caused by the afternoon doses. We will continue to monitor this.  -CHRONIC BACK PAIN: Chronic back pain is long-term pain in your back that  can affect your daily activities. Your pain is currently managed with Tylenol  and monocaine patches, and we will continue this regimen as needed.  -BOWEL HABIT CHANGES: Changes in bowel habits can sometimes affect other aspects of your health. You have regular bowel movements but sometimes experience sudden and loose stools. We will maintain a regular bowel regimen to help manage this.  INSTRUCTIONS:  Please follow up with the lab tests we discussed, including B12 and vitamin D  levels. Continue taking your medications as prescribed and use the incentive spirometer daily. If your symptoms persist or worsen, please contact our office. We may consider additional imaging or referrals based on your lab results.

## 2024-04-30 ENCOUNTER — Encounter: Payer: Self-pay | Admitting: Student

## 2024-04-30 LAB — BRAIN NATRIURETIC PEPTIDE: Brain Natriuretic Peptide: 199 pg/mL — ABNORMAL HIGH (ref <100)

## 2024-05-01 ENCOUNTER — Ambulatory Visit: Payer: Self-pay | Admitting: Student

## 2024-05-01 DIAGNOSIS — E039 Hypothyroidism, unspecified: Secondary | ICD-10-CM

## 2024-05-01 DIAGNOSIS — E538 Deficiency of other specified B group vitamins: Secondary | ICD-10-CM

## 2024-05-01 DIAGNOSIS — E559 Vitamin D deficiency, unspecified: Secondary | ICD-10-CM

## 2024-05-01 LAB — CBC WITH DIFFERENTIAL/PLATELET
Absolute Lymphocytes: 1392 {cells}/uL (ref 850–3900)
Absolute Monocytes: 462 {cells}/uL (ref 200–950)
Basophils Absolute: 28 {cells}/uL (ref 0–200)
Basophils Relative: 0.5 %
Eosinophils Absolute: 462 {cells}/uL (ref 15–500)
Eosinophils Relative: 8.4 %
HCT: 44.1 % (ref 38.5–50.0)
Hemoglobin: 14.7 g/dL (ref 13.2–17.1)
MCH: 34.3 pg — ABNORMAL HIGH (ref 27.0–33.0)
MCHC: 33.3 g/dL (ref 32.0–36.0)
MCV: 103 fL — ABNORMAL HIGH (ref 80.0–100.0)
MPV: 8.9 fL (ref 7.5–12.5)
Monocytes Relative: 8.4 %
Neutro Abs: 3157 {cells}/uL (ref 1500–7800)
Neutrophils Relative %: 57.4 %
Platelets: 162 Thousand/uL (ref 140–400)
RBC: 4.28 Million/uL (ref 4.20–5.80)
RDW: 12.5 % (ref 11.0–15.0)
Total Lymphocyte: 25.3 %
WBC: 5.5 Thousand/uL (ref 3.8–10.8)

## 2024-05-01 LAB — COMPLETE METABOLIC PANEL WITHOUT GFR
AG Ratio: 1.9 (calc) (ref 1.0–2.5)
ALT: 15 U/L (ref 9–46)
AST: 21 U/L (ref 10–35)
Albumin: 4.3 g/dL (ref 3.6–5.1)
Alkaline phosphatase (APISO): 49 U/L (ref 35–144)
BUN: 18 mg/dL (ref 7–25)
CO2: 33 mmol/L — ABNORMAL HIGH (ref 20–32)
Calcium: 9.2 mg/dL (ref 8.6–10.3)
Chloride: 104 mmol/L (ref 98–110)
Creat: 1.03 mg/dL (ref 0.70–1.22)
Globulin: 2.3 g/dL (ref 1.9–3.7)
Glucose, Bld: 105 mg/dL — ABNORMAL HIGH (ref 65–99)
Potassium: 3.3 mmol/L — ABNORMAL LOW (ref 3.5–5.3)
Sodium: 143 mmol/L (ref 135–146)
Total Bilirubin: 0.9 mg/dL (ref 0.2–1.2)
Total Protein: 6.6 g/dL (ref 6.1–8.1)

## 2024-05-01 LAB — HEMOGLOBIN A1C
Hgb A1c MFr Bld: 5.7 % — ABNORMAL HIGH (ref <5.7)
Mean Plasma Glucose: 117 mg/dL
eAG (mmol/L): 6.5 mmol/L

## 2024-05-01 LAB — LIPID PANEL
Cholesterol: 129 mg/dL (ref <200)
HDL: 40 mg/dL (ref >=40)
LDL Cholesterol (Calc): 68 mg/dL
Non-HDL Cholesterol (Calc): 89 mg/dL (ref <130)
Total CHOL/HDL Ratio: 3.2 (calc) (ref <5.0)
Triglycerides: 128 mg/dL (ref <150)

## 2024-05-01 LAB — VITAMIN D 25 HYDROXY (VIT D DEFICIENCY, FRACTURES): Vit D, 25-Hydroxy: 22 ng/mL — ABNORMAL LOW (ref 30–100)

## 2024-05-01 LAB — TSH: TSH: 6.77 m[IU]/L — ABNORMAL HIGH (ref 0.40–4.50)

## 2024-05-01 LAB — VITAMIN B12: Vitamin B-12: 242 pg/mL (ref 200–1100)

## 2024-05-03 ENCOUNTER — Ambulatory Visit: Payer: Self-pay | Admitting: Cardiology

## 2024-05-05 MED ORDER — LEVOTHYROXINE SODIUM 50 MCG PO TABS
50.0000 ug | ORAL_TABLET | Freq: Every day | ORAL | 3 refills | Status: AC
Start: 2024-05-05 — End: ?

## 2024-05-05 MED ORDER — VITAMIN D (ERGOCALCIFEROL) 1.25 MG (50000 UNIT) PO CAPS: 50000.0000 [IU] | ORAL_CAPSULE | ORAL | 0 refills | Status: DC

## 2024-05-13 ENCOUNTER — Encounter: Payer: Self-pay | Admitting: Student

## 2024-05-13 ENCOUNTER — Ambulatory Visit: Admitting: Student

## 2024-05-13 VITALS — BP 118/62 | HR 85 | Temp 97.9°F | Ht 71.0 in | Wt 174.4 lb

## 2024-05-13 DIAGNOSIS — R7989 Other specified abnormal findings of blood chemistry: Secondary | ICD-10-CM | POA: Diagnosis not present

## 2024-05-13 DIAGNOSIS — E876 Hypokalemia: Secondary | ICD-10-CM

## 2024-05-13 DIAGNOSIS — E538 Deficiency of other specified B group vitamins: Secondary | ICD-10-CM

## 2024-05-13 DIAGNOSIS — E559 Vitamin D deficiency, unspecified: Secondary | ICD-10-CM

## 2024-05-13 DIAGNOSIS — R7303 Prediabetes: Secondary | ICD-10-CM

## 2024-05-13 NOTE — Patient Instructions (Signed)
 VISIT SUMMARY:  During your visit, we discussed your low energy levels and concerns about medication interactions. We reviewed your recent lab results, which showed low levels of vitamin D  and B12. We also discussed your history of coronary artery disease, recent kidney scan findings, and your experience with neuropathy and balance issues. Additionally, we addressed your prediabetes and thyroid function.  YOUR PLAN:  -VITAMIN B12 DEFICIENCY WITH ASSOCIATED PERIPHERAL NEUROPATHY: Your vitamin B12 level is low, which can cause symptoms like low energy and balance issues. You should take a daily vitamin B12 supplement of 1000 micrograms. We will recheck your vitamin B12 levels in September.  -VITAMIN D  DEFICIENCY: Your vitamin D  level is low, which can contribute to fatigue. You should take a vitamin D  supplement once a week with a meal that contains fat for better absorption. We will recheck your vitamin D  levels in September.  -HYPOKALEMIA: Your potassium level is low. Continue taking your potassium supplement 4-5 times a week as previously instructed. We will recheck your potassium levels on September 22.  -PREDIABETES: Your A1c level indicates prediabetes, which means your blood sugar levels are higher than normal but not high enough to be classified as diabetes. Continue with your dietary changes to manage your blood sugar levels.  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 We are tapering off amiodarone  and will recheck your thyroid function on September 22. We will hold off on starting levothyroxine  until we re-evaluate your thyroid function.  INSTRUCTIONS:  Please follow up for rechecking your vitamin B12, vitamin D , potassium, and thyroid levels in September. Continue with your current dietary changes and medication regimen as discussed.

## 2024-05-14 ENCOUNTER — Encounter: Payer: Self-pay | Admitting: Student

## 2024-05-14 NOTE — Progress Notes (Signed)
 [facility]  Provider: ABDUL  Code Status: Full Code Goals of Care:     04/29/2024    2:29 PM  Advanced Directives  Does Patient Have a Medical Advance Directive? Yes  Type of Advance Directive Living will  Does patient want to make changes to medical advance directive? No - Patient declined     Chief Complaint  Patient presents with   Discuss Lab Results.     Discuss Lab Results.     HPI: Patient is a 88 y.o. male seen today for an acute visit  Discussed the use of AI scribe software for clinical note transcription with the patient, who gave verbal consent to proceed.  History of Present Illness   Kyle Morales is an 88 year old male with coronary artery disease who presents with low energy and concerns about medication interactions. He is accompanied by his wife.  He has been experiencing low energy levels for some time. Recent lab tests revealed low vitamin D  levels at 22 ng/mL and low vitamin B12 levels at 242 pg/mL.  He has a history of coronary artery disease and is currently taking Eliquis  twice a day. He has not yet started levothyroxine  due to concerns about potential medication interactions.  A recent kidney scan revealed multiple non-enhancing renal cysts, the largest measuring 4.8 cm, and non-obstructing renal calculi measuring 0.9 cm. There is no hydronephrosis or suspicious enhancing mass. The scan also noted a 1.4 cm hyperattenuating structure within a blind-ending right-sided small bowel loop, which was highlighted by his urologist. He has a history of two abdominal surgeries, including gallbladder removal, which may have contributed to the complex anatomy noted in the scan. He reports no current symptoms of bowel obstruction or inflammation.  He has been experiencing neuropathy, which he associates with his low B12 levels. He reports poor balance and relies on support even at home. His A1c is 5.7, indicating prediabetes, and he has  made dietary changes to manage his sugar intake, such as switching to low-sugar beverages. No dizziness but has poor balance and relies on support for walking. He reports that he has not had thyroid treatment with iodine.        Past Medical History:  Diagnosis Date   Aortic atherosclerosis (HCC)    Aortic root dilation (HCC)    a.) TTE [id number]: Ao root measured 39 mm.   BPH (benign prostatic hyperplasia)    CHF (congestive heart failure) (HCC)    Coronary artery disease    a.) remote PCI of the RI in 1997; stent (unknown type) placed.   Depression    Diverticulosis    Eczema    GERD (gastroesophageal reflux disease)    Hyperlipidemia    Hypertension    Inguinal hernia, left    Long term current use of anticoagulant    a.) apixaban    Nephrolithiasis    NSVT (nonsustained ventricular tachycardia) (HCC)    Osteoarthrosis    Paroxysmal A-fib (HCC)    a.) CHA2DS2VASc = 5 (age x2, CHF, HTN, vascular disease history);  b.) rate/rhythm maintained on oral metoprolol  succinate; chronically anticoagulated with apixaban    Pharyngeal dysphagia    Presence of permanent cardiac pacemaker 2007   a.) Medtronic device placed in [place]  in 2007; b.) generator changed at [facility] on 11/18/2015   RBBB    SSS (sick sinus syndrome) (HCC) 2007   a.) s/p PPM placement in [place]  in 2007    Past Surgical History:  Procedure Laterality Date   BLEPHAROPLASTY Bilateral    CARDIAC CATHETERIZATION  09/25/1995   CARDIAC CATHETERIZATION  09/24/1998   CHOLECYSTECTOMY     COLONOSCOPY  09/24/2010   CORONARY ANGIOPLASTY  09/25/1995   ramus   ESOPHAGOGASTRODUODENOSCOPY N/A 02/11/2017   Procedure: ESOPHAGOGASTRODUODENOSCOPY (EGD);  Surgeon: Gaylyn Gladis PENNER, MD;  Location: [facility] ENDOSCOPY;  Service: Endoscopy;  Laterality: N/A;   INGUINAL HERNIA REPAIR Left 1977   INSERT / REPLACE / REMOVE PACEMAKER  12/23/2005   Medtronic [id number] - placed in [place]    INSERTION OF MESH Left 06/08/2022   Procedure:  INSERTION OF MESH;  Surgeon: Lane Shope, MD;  Location: [facility] ORS;  Service: General;  Laterality: Left;   MANDIBLE SURGERY  1966   PACEMAKER GENERATOR CHANGE N/A 11/18/2015   ROBOTIC ASSISTED LAPAROSCOPIC CHOLECYSTECTOMY  01/12/2022   TONSILLECTOMY     TOTAL HIP ARTHROPLASTY Right 2022   TOTAL SHOULDER REPLACEMENT     bilateral approx 2020 and 2021    Allergies  Allergen Reactions   Amoxicillin Rash and Itching   Bactrim [Sulfamethoxazole-Trimethoprim] Rash    Outpatient Encounter Medications as of 05/13/2024  Medication Sig   acetaminophen  (TYLENOL ) 500 MG tablet Take 500 mg by mouth every 8 (eight) hours as needed.   albuterol  (VENTOLIN  HFA) 108 (90 Base) MCG/ACT inhaler Inhale 2 puffs into the lungs every 6 (six) hours as needed for wheezing or shortness of breath.   amiodarone  (PACERONE ) 100 MG tablet Take 2 tablets (200 mg total) by mouth daily for 30 days, THEN 1 tablet (100 mg total) daily.   bisoprolol  (ZEBETA ) 5 MG tablet Take 1 tablet (5 mg total) by mouth daily.   [employer] ZINC  50 MG TABS Take 50 mg by mouth daily.   ELIQUIS  5 MG TABS tablet TAKE 1 TABLET TWICE A DAY   finasteride  (PROSCAR ) 5 MG tablet Take 1 tablet (5 mg total) by mouth daily.   fluticasone  (FLONASE ) 50 MCG/ACT nasal spray Place 2 sprays into both nostrils 2 (two) times daily.   furosemide  (LASIX ) 40 MG tablet Take 3 tablets (120 mg total) by mouth daily. 3 tablets daily alternating with 3 tablets twice daily.   lidocaine  (LIDODERM ) 5 % Place 1 patch onto the skin daily. Remove & Discard patch within 12 hours or as directed by MD   lovastatin  (MEVACOR ) 20 MG tablet Take 1 tablet (20 mg total) by mouth at bedtime.   nitroGLYCERIN  (NITROSTAT ) 0.4 MG SL tablet Place 1 tablet (0.4 mg total) under the tongue every 5 (five) minutes as needed for chest pain.   potassium chloride  SA (KLOR-CON  M) 20 MEQ tablet Take 1 tablet (20 mEq total) by mouth daily. Take with lasix    sertraline  (ZOLOFT ) 100 MG tablet Take 1  tablet (100 mg total) by mouth daily.   tamsulosin  (FLOMAX ) 0.4 MG CAPS capsule Take 2 capsules (0.8 mg total) by mouth daily after breakfast.   TRELEGY ELLIPTA  100-62.5-25 MCG/ACT AEPB Inhale 1 puff into the lungs daily.   levothyroxine  (SYNTHROID ) 50 MCG tablet Take 1 tablet (50 mcg total) by mouth daily. (Patient not taking: Reported on 05/13/2024)   Vitamin D , Ergocalciferol , (DRISDOL ) 1.25 MG (50000 UNIT) CAPS capsule Take 1 capsule (50,000 Units total) by mouth every 7 (seven) days. (Patient not taking: Reported on 05/13/2024)   No facility-administered encounter medications on file as of 05/13/2024.    Review of Systems:  Review of Systems  Health Maintenance  Topic Date Due   COVID-19 Vaccine (11 - 2024-25 season) 02/28/2024  INFLUENZA VACCINE  04/24/2024   Medicare Annual Wellness (AWV)  01/01/2025   DTaP/Tdap/Td (2 - Td or Tdap) 02/28/2033   Pneumococcal Vaccine: 50+ Years  Completed   Zoster Vaccines- Shingrix  Completed   HPV VACCINES  Aged Out   Meningococcal B Vaccine  Aged Out    Physical Exam: Vitals:   05/13/24 1600  BP: 118/62  Pulse: 85  Temp: 97.9 F (36.6 C)  SpO2: 92%  Weight: 174 lb 6.4 oz (79.1 kg)  Height: 5' 11 (1.803 m)   Body mass index is 24.32 kg/m. Physical Exam    Labs reviewed: Basic Metabolic Panel: Recent Labs    06/05/23 1015 10/31/23 0750 02/18/24 0953 04/30/24 0735  NA  --  143  --  143  K  --  3.7  --  3.3*  CL  --  105  --  104  CO2  --  30  --  33*  GLUCOSE  --  101*  --  105*  BUN  --  12  --  18  CREATININE 1.00 0.88  --  1.03  CALCIUM  --  8.9  --  9.2  TSH  --   --  3.830 6.77*   Liver Function Tests: Recent Labs    10/31/23 0750 04/30/24 0735  AST 15 21  ALT 12 15  BILITOT 0.7 0.9  PROT 6.4 6.6   No results for input(s): LIPASE, AMYLASE in the last 8760 hours. No results for input(s): AMMONIA in the last 8760 hours. CBC: Recent Labs    09/09/23 0738 10/31/23 0750 04/30/24 0735  WBC 6.2 5.3  5.5  NEUTROABS 3,720 2,979 3,157  HGB 15.4 15.5 14.7  HCT 45.6 46.5 44.1  MCV 98.7 99.8 103.0*  PLT 168 158 162   Lipid Panel: Recent Labs    04/30/24 0735  CHOL 129  HDL 40  LDLCALC 68  TRIG 128  CHOLHDL 3.2   Lab Results  Component Value Date   HGBA1C 5.7 (H) 04/30/2024    Procedures since last visit: CUP PACEART REMOTE DEVICE CHECK Result Date: 04/29/2024 PPM scheduled remote reviewed. Normal device function.  Presenting rhythm: AP-VS 3 NSVT events.  V-rates 167-170bpm. Longest x 19 beats.  All V>A. Next remote 91 days. AB, CVRS  NM PET CT CARDIAC PERFUSION MULTI W/ABSOLUTE BLOODFLOW Result Date: 04/16/2024   LV perfusion is normal.   Rest left ventricular function is normal. Rest EF: 50%. Stress left ventricular function is normal. Stress EF: 56%. End diastolic cavity size is normal. End systolic cavity size is normal.   Myocardial blood flow reserve is not reported in this patient due to technical or patient-specific concerns that affect accuracy.   Coronary calcium assessment not performed due to prior revascularization.   The study is normal. The study is low risk.   Electronically signed by: Lonni Hanson, MD. CLINICAL DATA:  This over-read does not include interpretation of cardiac or coronary anatomy or pathology. The Cardiac PET CT interpretation by the cardiologist is attached. COMPARISON:  10/18/2023 chest CT FINDINGS: Right hemidiaphragm elevation No pleural fluid. Minimal motion degradation Right greater than left lung base volume loss and scarring. 9 mm pleural-based pulmonary nodule is suboptimally evaluated secondary to motion including on 27/4 but likely decreased compared to 10/18/2023 diagnostic chest CT. Aortic athe.  Rosclerosis. Pacer/ICD No imaged thoracic adenopathy. Bilobar hepatic cysts. Irregular hepatic capsule. Normal imaged portions of the spleen, stomach, pancreas, adrenal glands. Mild bilateral gynecomastia Old right rib fractures. Thoracic spondylosis  and moderate to marked S shaped thoracic spine curvature. IMPRESSION: No acute findings in the imaged extracardiac chest. Motion degradation Right lower lobe 9 mm pulmonary nodule was detailed on 10/18/2023 CT. Better evaluated on that exam. Please see that report for follow-up recommendations. Irregular hepatic capsule suggests cirrhosis. Electronically Signed   By: Rockey Kilts M.D.   On: 04/16/2024 15:38  Results   LABS Vitamin D : 22 ng/mL Vitamin B12: 242 pg/mL Hemoglobin A1c: 5.7% Mean Corpuscular Volume (MCV): Elevated Thyroid-Stimulating Hormone (TSH): 6.7 IU/mL  RADIOLOGY Renal mass protocol CT: Helical CT of the abdomen without IV contrast. Lower chest by basilar adductor system scarring, coronary artery calcifications, pacemaker in right atrium and ventricle. Normal liver contour, multiple fluid-attenuating hypodensities, largest 2.8 cm. Fluid-attenuating hypodensity of hepatic dome with peripheral calcifications. Additional subcentimeter hypodensities too small to characterize. No biliary dilation, gallbladder surgically absent. Spleen normal size and contour. Pancreas with diffuse fatty parenchymal infiltration, no focal lesions or dilation. Normal adrenal glands. Right kidney homogeneous enhancement, non-obstructing renal calculi 0.9 cm, multiple non-enhancing renal cysts, largest 3.5 cm. Left kidney homogeneous enhancement, punctate renal calculi, multiple non-enhancing renal cysts up to 4.8 cm. Cecum in right upper quadrant, complex anatomy of small bowel loops in right hemioabdomen with 1.4 cm hyperattenuating structure. No bowel obstruction or inflammation. No free air, ascites, fluid collection, or lymphadenopathy. Patent hepatic and portal veins, reflux of contrast in intrahepatic IVC and right hepatic vein, normal caliber aorta, calcified atherosclerosis of abdominal aorta and branched vessels. Multilevel chronic appearing right-sided rib fractures, thoracolumbar levoscoliosis.  (04/08/2024)       Assessment/Plan    Vitamin B12 deficiency with associated peripheral neuropathy Vitamin B12 level is low at 242, below the desired level of 400, with associated peripheral neuropathy potentially contributing to balance issues due to impaired proprioception. No anemia is present, but elevated mean corpuscular volume supports low B12. Symptoms include low energy and potential memory changes. Improvement in B12 levels may lead to symptom improvement. - Take vitamin B12 supplement daily at 1000 micrograms. - Recheck vitamin B12 levels in September.  Vitamin D  deficiency Vitamin D  level is low at 22, below the desired level of 30, contributing to fatigue and other symptoms. Vitamin D  is a fat-soluble vitamin, requiring intake with dietary fat for optimal absorption. - Take vitamin D  supplement once a week. - Ensure vitamin D  is taken with a meal containing fat for better absorption. - Recheck vitamin D  levels in September.  Hypokalemia Potassium level is low. He has increased potassium supplementation to 4-5 times a week as per previous instructions from another provider. - Recheck potassium levels on September 22.  Prediabetes A1c is 5.7, consistent with prediabetes. No medication required at this time. He has made dietary changes, including switching to low sugar beverages. - Continue dietary modifications to manage blood sugar levels.  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 Amiodarone  is known to affect thyroid function and is being tapered off. Decision made to hold off on starting levothyroxine  until re-evaluation of thyroid function after discontinuation of amiodarone . - Recheck thyroid function on September 22. - Hold off on starting levothyroxine  until re-evaluation. - If thyroid levels remain elevated after re-evaluation, consider starting levothyroxine  with specific timing  instructions for administration.       Labs/tests ordered:  * No order type specified * Next appt:  06/17/2024  I spent greater than 40 minutes for the care of this patient in face to face time,  chart review, clinical documentation, patient education.

## 2024-05-19 NOTE — Progress Notes (Unsigned)
 Electrophysiology Clinic Note    Date:  05/20/2024  Patient ID:  Kyle Morales, Kyle Morales 1936-02-15, MRN [id number] PCP:  Abdul Fine, MD  Cardiologist:  Evalene Lunger, MD   Cardiology APP:  Loistine Sober, NP  Electrophysiologist:  Fonda Kitty, MD  Electrophysiology APP:  Yash Cacciola, NP     Discussed the use of AI scribe software for clinical note transcription with the patient, who gave verbal consent to proceed.   Patient Profile    Chief Complaint: AFib follow-up  History of Present Illness: Kyle Morales is a 88 y.o. male with PMH notable for SND s/p PPM, afib, CAD s/p PCI, parox AFib, NSVT, fatigue; seen today for Fonda Kitty, MD for routine electrophysiology followup.   He last saw Dr. Kitty 01/2024 where he had increased AFib burden, amiodarone  restarted.   He recently saw PCP where lab work showed hypokalemia and hypothyroid. K supplement increased and planned to re-assess thyroid function at next appt.  On follow-up today, he stopped amiodarone  earlier this week on recommendation from his PCP. He continues to have SOB with minimal exertion and fatigue, feeling exhausted after walking short distances. He also has balance difficulties with his legs, no dizziness or presyncope. He is not aware of any AF episodes. He checks BP at home and most readings are 120s/80s He cotninues to take eliquis  without bleeding concerns.       Arrhythmia/Device History Medtronic dual chamber PPM, imp 2007 ([place] ), dx CHB Gen change 10/2015 at [facility]   AAD History: Amiodarone       ROS:  Please see the history of present illness. All other systems are reviewed and otherwise negative.    Physical Exam    VS:  BP 108/60   Pulse 71   Ht 5' 11 (1.803 m)   Wt 176 lb (79.8 kg)   SpO2 95%   BMI 24.55 kg/m  BMI: Body mass index is 24.55 kg/m.      Wt Readings from Last 3 Encounters:  05/20/24 176 lb (79.8 kg)  05/13/24 174 lb 6.4 oz (79.1 kg)  04/29/24  166 lb (75.3 kg)     GEN- The patient is well appearing, alert and oriented x 3 today.   Lungs- Clear to ausculation bilaterally, normal work of breathing.  Heart- Regular rate and rhythm, no murmurs, rubs or gallops Extremities- No peripheral edema, warm, dry Skin-  device pocket well-healed, no tethering   Device interrogation done today and reviewed by myself:  Battery 17 months Lead thresholds, impedence, sensing stable  Brief nonsustained VT episodes noted No changes made today   Studies Reviewed   Previous EP, cardiology notes.    EKG is ordered. Personal review of EKG from today shows:    EKG Interpretation Date/Time:  Wednesday May 20 2024 09:49:44 EDT Ventricular Rate:  71 PR Interval:  322 QRS Duration:  118 QT Interval:  444 QTC Calculation: 482 R Axis:   -4  Text Interpretation: Atrial-paced rhythm with prolonged AV conduction Incomplete right bundle branch block Confirmed by Makila Colombe [phone]) on 05/20/2024 9:53:38 AM    Echo 10/11/22:   1. Left ventricular ejection fraction, by estimation, is 50 to 55%. The left ventricle has low normal function. The left ventricle has no regional wall motion abnormalities. There is moderate to severe left ventricular hypertrophy mid ventricle extending distally, severe in the apical region with near cavity obliteration. Left ventricular diastolic parameters are indeterminate.   2. Right ventricular systolic function is normal. The  right ventricular size is normal. There is mildly elevated pulmonary artery systolic pressure. The estimated right ventricular systolic pressure is 36.1 mmHg.   3. Left atrial size was severely dilated.   4. The mitral valve is normal in structure. Mild mitral valve regurgitation. No evidence of mitral stenosis.   5. The aortic valve is tricuspid. Aortic valve regurgitation is not visualized. No aortic stenosis is present.   6. There is borderline dilatation of the ascending aorta, measuring 37 mm.    7. The inferior vena cava is normal in size with greater than 50% respiratory variability, suggesting right atrial pressure of 3 mmHg.    Assessment and Plan     #) parox AFib #) amiodarone  monitoring #) DOE Recent chest CT without ILD Low AF burden via PPM PCP recently recommended he stop amiodarone  We discussed continuing and treating hypothyroid vs stopping and re-assessing AF at follow-up At this time, patient would like to stop amio and reassess   #) Hypercoag d/t  afib CHA2DS2-VASc Score = at least 4 [CHF History: 1, HTN History: 1, Diabetes History: 0, Stroke History: 0, Vascular Disease History: 0, Age Score: 2, Gender Score: 0].  Therefore, the patient's annual risk of stroke is 4.8 %.    Stroke ppx - 5mg  eliquis  BID, appropriately dosed No bleeding concerns    #) SND s/p PPM PPM functioning well, see paceart for details       Current medicines are reviewed at length with the patient today.   The patient does not have concerns regarding his medicines.  The following changes were made today:  none  Labs/ tests ordered today include:  Orders Placed This Encounter  Procedures   EKG 12-Lead     Disposition: Follow up with Dr. Kennyth or EP APP in 3-4 months  , continue remote device monitoring   Signed, Chantal Needle, NP  05/20/24  11:51 AM  Electrophysiology [facility]

## 2024-05-20 ENCOUNTER — Encounter: Payer: Self-pay | Admitting: Cardiology

## 2024-05-20 ENCOUNTER — Ambulatory Visit: Attending: Cardiology | Admitting: Cardiology

## 2024-05-20 VITALS — BP 108/60 | HR 71 | Ht 71.0 in | Wt 176.0 lb

## 2024-05-20 DIAGNOSIS — R0602 Shortness of breath: Secondary | ICD-10-CM | POA: Diagnosis present

## 2024-05-20 DIAGNOSIS — Z79899 Other long term (current) drug therapy: Secondary | ICD-10-CM | POA: Insufficient documentation

## 2024-05-20 DIAGNOSIS — Z950 Presence of cardiac pacemaker: Secondary | ICD-10-CM | POA: Insufficient documentation

## 2024-05-20 DIAGNOSIS — Z5181 Encounter for therapeutic drug level monitoring: Secondary | ICD-10-CM | POA: Diagnosis present

## 2024-05-20 DIAGNOSIS — I480 Paroxysmal atrial fibrillation: Secondary | ICD-10-CM | POA: Insufficient documentation

## 2024-05-20 DIAGNOSIS — I495 Sick sinus syndrome: Secondary | ICD-10-CM | POA: Diagnosis not present

## 2024-05-20 LAB — CUP PACEART INCLINIC DEVICE CHECK
Date Time Interrogation Session: 20250827115404
Implantable Lead Connection Status: 753985
Implantable Lead Connection Status: 753985
Implantable Lead Implant Date: 20070411
Implantable Lead Implant Date: 20070411
Implantable Lead Location: 753859
Implantable Lead Location: 753860
Implantable Lead Model: 5076
Implantable Lead Model: 5076
Implantable Pulse Generator Implant Date: 20170224

## 2024-05-20 NOTE — Patient Instructions (Signed)
 Medication Instructions:   Your physician recommends that you continue on your current medications as directed. Please refer to the Current Medication list given to you today.    *If you need a refill on your cardiac medications before your next appointment, please call your pharmacy*  Lab Work:  No labs ordered today   If you have labs (blood work) drawn today and your tests are completely normal, you will receive your results only by: MyChart Message (if you have MyChart) OR A paper copy in the mail If you have any lab test that is abnormal or we need to change your treatment, we will call you to review the results.  Testing/Procedures:  No test ordered today   Follow-Up: At [facility], you and your health needs are our priority.  As part of our continuing mission to provide you with exceptional heart care, our providers are all part of one team.  This team includes your primary Cardiologist (physician) and Advanced Practice Providers or APPs (Physician Assistants and Nurse Practitioners) who all work together to provide you with the care you need, when you need it.  Your next appointment:   3-4  month(s)  Provider:   Suzann Riddle, NP or Dr. Kennyth    We recommend signing up for the patient portal called MyChart.  Sign up information is provided on this After Visit Summary.  MyChart is used to connect with patients for Virtual Visits (Telemedicine).  Patients are able to view lab/test results, encounter notes, upcoming appointments, etc.  Non-urgent messages can be sent to your provider as well.   To learn more about what you can do with MyChart, go to [email or web address].   Other Instructions Please monitor blood pressure 1-2 hours after taking medications.

## 2024-05-24 ENCOUNTER — Ambulatory Visit: Payer: Self-pay | Admitting: Cardiology

## 2024-06-15 ENCOUNTER — Encounter: Payer: Self-pay | Admitting: Podiatry

## 2024-06-15 ENCOUNTER — Ambulatory Visit (INDEPENDENT_AMBULATORY_CARE_PROVIDER_SITE_OTHER): Admitting: Podiatry

## 2024-06-15 DIAGNOSIS — M79675 Pain in left toe(s): Secondary | ICD-10-CM

## 2024-06-15 DIAGNOSIS — I739 Peripheral vascular disease, unspecified: Secondary | ICD-10-CM | POA: Diagnosis not present

## 2024-06-15 DIAGNOSIS — B351 Tinea unguium: Secondary | ICD-10-CM

## 2024-06-15 DIAGNOSIS — M79674 Pain in right toe(s): Secondary | ICD-10-CM | POA: Diagnosis not present

## 2024-06-15 LAB — TSH: TSH: 3.76 m[IU]/L (ref 0.40–4.50)

## 2024-06-17 ENCOUNTER — Encounter: Admitting: Student

## 2024-06-17 NOTE — Progress Notes (Signed)
  Subjective:  Patient ID: Kyle Morales, male    DOB: [birth year],  MRN: [id number]  Kyle Morales presents to clinic today for at risk foot care. Patient has h/o PAD and painful mycotic toenails of both feet that are difficult to trim. Pain interferes with daily activities and wearing enclosed shoe gear comfortably.  Chief Complaint  Patient presents with   RFC    RFC Non diabetic toenail trim. LOV with PCP 01/24/24.   New problem(s): None.   PCP is Abdul Fine, MD.  Allergies  Allergen Reactions   Amoxicillin Rash and Itching   Bactrim [Sulfamethoxazole-Trimethoprim] Rash    Review of Systems: Negative except as noted in the HPI.  Objective: No changes noted in today's physical examination. There were no vitals filed for this visit. Kyle Morales is a pleasant 88 y.o. male WD, WN in NAD. AAO x 3.  Vascular Examination: CFT <3 seconds b/l. DP pulses 1/4 b/l. PT pulses 0/4 b/l. Digital hair absent. Skin temperature gradient warm to warm b/l. No pain with calf compression. No ischemia or gangrene. No cyanosis or clubbing noted b/l.    Neurological Examination: Sensation grossly intact b/l with 10 gram monofilament. Vibratory sensation intact b/l.   Dermatological Examination: Pedal skin warm and supple b/l. No open wounds b/l. No interdigital macerations. Toenails 1-5 b/l thick, discolored, elongated with subungual debris and pain on dorsal palpation.  No corns, calluses nor porokeratotic lesions noted.  Musculoskeletal Examination: Muscle strength 5/5 to all lower extremity muscle groups bilaterally. No pain, crepitus or joint limitation noted with ROM b/l LE. Hammertoe(s) 1-5 b/l. Patient ambulates with cane assistance.  Radiographs: None  Assessment/Plan: 1. Pain due to onychomycosis of toenails of both feet   2. Peripheral vascular disease with claudication   Consent given for treatment. Patient examined. All patient's and/or POA's questions/concerns addressed on  today's visit. Toenails 1-5 debrided in length and girth without incident. Continue soft, supportive shoe gear daily. Report any pedal injuries to medical professional. Call office if there are any questions/concerns. -Patient/POA to call should there be question/concern in the interim.   Return in about 3 months (around 09/14/2024).  Kyle Morales, DPM      [facility] LOCATION: [address] [place] [address]                   Office [phone]   [facility] LOCATION: [address] [place], [place] [address] Office [phone]

## 2024-06-18 ENCOUNTER — Non-Acute Institutional Stay: Admitting: Nurse Practitioner

## 2024-06-18 ENCOUNTER — Encounter: Payer: Self-pay | Admitting: Nurse Practitioner

## 2024-06-18 VITALS — BP 110/62 | HR 63 | Temp 97.9°F | Ht 71.0 in | Wt 173.6 lb

## 2024-06-18 DIAGNOSIS — R0609 Other forms of dyspnea: Secondary | ICD-10-CM

## 2024-06-18 DIAGNOSIS — E538 Deficiency of other specified B group vitamins: Secondary | ICD-10-CM | POA: Diagnosis not present

## 2024-06-18 DIAGNOSIS — E039 Hypothyroidism, unspecified: Secondary | ICD-10-CM

## 2024-06-18 DIAGNOSIS — E559 Vitamin D deficiency, unspecified: Secondary | ICD-10-CM

## 2024-06-18 DIAGNOSIS — E876 Hypokalemia: Secondary | ICD-10-CM

## 2024-06-18 DIAGNOSIS — I5032 Chronic diastolic (congestive) heart failure: Secondary | ICD-10-CM

## 2024-06-18 NOTE — Progress Notes (Signed)
 Careteam: Patient Care Team: Abdul Fine, MD as PCP - General (Family Medicine) Perla Evalene PARAS, MD as PCP - Cardiology (Cardiology) Kennyth Chew, MD as PCP - Electrophysiology (Cardiology) Perla Evalene PARAS, MD as Consulting Physician (Cardiology) Maree Genell SAILOR, MD as Referring Physician (Otolaryngology) Francisca Redell BROCKS, MD as Consulting Physician (Urology) Alpha Chloe SAUNDERS, MD as Referring Physician (Dermatology) Pa, [facility]) Isadora Hose, MD as Consulting Physician (Pulmonary Disease) Riddle, Suzann, NP as Nurse Practitioner (Clinical Cardiac Electrophysiology) Loistine Sober, NP as Nurse Practitioner (Cardiology) PLACE OF SERVICE:  [facility]   Advanced Directive information    Allergies  Allergen Reactions   Amoxicillin Rash and Itching   Bactrim [Sulfamethoxazole-Trimethoprim] Rash    Chief Complaint  Patient presents with   Medical Management of Chronic Issues    Medical Management of Chronic Issues. To discuss Lab Results.      HPI: Patient is a 88 y.o. male seen in today at [facility].  Discussed the use of AI scribe software for clinical note transcription with the patient, who gave verbal consent to proceed.  History of Present Illness Kyle Morales is an 88 year old male with a history of elevated thyroid levels, heart failure, and chronic back pain who presents for a follow-up to discuss lab results. He is accompanied by his wife.  He is here to discuss lab results, particularly concerning his thyroid function. He had previously experienced low energy levels and was concerned about his medication regimen. In August, he started a vitamin B12 supplement due to low b12 level. He is currently taking 1000 micrograms of vitamin B12 daily.  He is taking a weekly vitamin D  supplement of 50,000 units after low vit D level noted and a potassium supplement three to four times a week due to hypokalemia  His  thyroid-stimulating hormone (TSH) was mildly elevated, but after stopping amiodarone , his thyroid levels have normalized. He continues to take Synthroid  50 micrograms daily.  He experiences shortness of breath, particularly when walking short distances or even while sitting. He describes feeling 'out of breath' without exertion. He has a history of heart failure. He has been evaluated by a pulmonologist who ruled out COPD and recommended continuing Trelegy. He has not noticed any worsening of his breathing since his last pulmonology or cardiology visit   He has chronic back pain, which has worsened recently. He previously received injections at the [facility], which provided only temporary relief. He has not engaged in physical therapy in the past year due to Medicare coverage limitations. His activity level is low, and he has not started a walking routine despite intentions to do so.   No issues with bowel movements, although he occasionally experiences urgency about once a week. His weight has been stable, with a slight decrease noted recently.   He received COVID and flu vaccinations on September 18th.   Review of Systems:  Review of Systems  Constitutional:  Positive for malaise/fatigue. Negative for chills, fever and weight loss.  HENT:  Negative for tinnitus.   Respiratory:  Positive for shortness of breath. Negative for cough and sputum production.   Cardiovascular:  Negative for chest pain, palpitations and leg swelling.  Gastrointestinal:  Negative for abdominal pain, constipation, diarrhea and heartburn.  Genitourinary:  Negative for dysuria, frequency and urgency.  Musculoskeletal:  Negative for back pain, falls, joint pain and myalgias.  Skin: Negative.   Neurological:  Negative for dizziness and headaches.  Psychiatric/Behavioral:  Negative  for depression and memory loss. The patient does not have insomnia.     Past Medical History:  Diagnosis Date   Aortic  atherosclerosis    Aortic root dilation    a.) TTE [id number]: Ao root measured 39 mm.   BPH (benign prostatic hyperplasia)    CHF (congestive heart failure) (HCC)    Coronary artery disease    a.) remote PCI of the RI in 1997; stent (unknown type) placed.   Depression    Diverticulosis    Eczema    GERD (gastroesophageal reflux disease)    Hyperlipidemia    Hypertension    Inguinal hernia, left    Long term current use of anticoagulant    a.) apixaban    Nephrolithiasis    NSVT (nonsustained ventricular tachycardia) (HCC)    Osteoarthrosis    Paroxysmal A-fib (HCC)    a.) CHA2DS2VASc = 5 (age x2, CHF, HTN, vascular disease history);  b.) rate/rhythm maintained on oral metoprolol  succinate; chronically anticoagulated with apixaban    Pharyngeal dysphagia    Presence of permanent cardiac pacemaker 2007   a.) Medtronic device placed in [place]  in 2007; b.) generator changed at [facility] on 11/18/2015   RBBB    SSS (sick sinus syndrome) (HCC) 2007   a.) s/p PPM placement in [place]  in 2007   Past Surgical History:  Procedure Laterality Date   BLEPHAROPLASTY Bilateral    CARDIAC CATHETERIZATION  09/25/1995   CARDIAC CATHETERIZATION  09/24/1998   CHOLECYSTECTOMY     COLONOSCOPY  09/24/2010   CORONARY ANGIOPLASTY  09/25/1995   ramus   ESOPHAGOGASTRODUODENOSCOPY N/A 02/11/2017   Procedure: ESOPHAGOGASTRODUODENOSCOPY (EGD);  Surgeon: Gaylyn Gladis PENNER, MD;  Location: [facility] ENDOSCOPY;  Service: Endoscopy;  Laterality: N/A;   INGUINAL HERNIA REPAIR Left 1977   INSERT / REPLACE / REMOVE PACEMAKER  12/23/2005   Medtronic [id number] - placed in [place]    INSERTION OF MESH Left 06/08/2022   Procedure: INSERTION OF MESH;  Surgeon: Lane Shope, MD;  Location: [facility] ORS;  Service: General;  Laterality: Left;   MANDIBLE SURGERY  1966   PACEMAKER GENERATOR CHANGE N/A 11/18/2015   ROBOTIC ASSISTED LAPAROSCOPIC CHOLECYSTECTOMY  01/12/2022   TONSILLECTOMY     TOTAL HIP ARTHROPLASTY Right 2022    TOTAL SHOULDER REPLACEMENT     bilateral approx 2020 and 2021   Social History:   reports that he has never smoked. He has never used smokeless tobacco. He reports current alcohol use. He reports that he does not use drugs.  Family History  Problem Relation Age of Onset   Heart disease Mother    Hypertension Mother    Hyperlipidemia Mother    Heart attack Father    Heart disease Brother     Medications: Patient's Medications  New Prescriptions   No medications on file  Previous Medications   ACETAMINOPHEN  (TYLENOL ) 500 MG TABLET    Take 500 mg by mouth every 8 (eight) hours as needed.   ALBUTEROL  (VENTOLIN  HFA) 108 (90 BASE) MCG/ACT INHALER    Inhale 2 puffs into the lungs every 6 (six) hours as needed for wheezing or shortness of breath.   BISOPROLOL  (ZEBETA ) 5 MG TABLET    Take 1 tablet (5 mg total) by mouth daily.   [facility] ZINC  50 MG TABS    Take 50 mg by mouth daily.   ELIQUIS  5 MG TABS TABLET    TAKE 1 TABLET TWICE A DAY   FINASTERIDE  (PROSCAR ) 5 MG TABLET    Take 1 tablet (5 mg total)  by mouth daily.   FLUTICASONE  (FLONASE ) 50 MCG/ACT NASAL SPRAY    Place 2 sprays into both nostrils 2 (two) times daily.   FUROSEMIDE  (LASIX ) 40 MG TABLET    Take 3 tablets (120 mg total) by mouth daily. 3 tablets daily alternating with 3 tablets twice daily.   LEVOTHYROXINE  (SYNTHROID ) 50 MCG TABLET    Take 1 tablet (50 mcg total) by mouth daily.   LIDOCAINE  (LIDODERM ) 5 %    Place 1 patch onto the skin daily. Remove & Discard patch within 12 hours or as directed by MD   LOVASTATIN  (MEVACOR ) 20 MG TABLET    Take 1 tablet (20 mg total) by mouth at bedtime.   NITROGLYCERIN  (NITROSTAT ) 0.4 MG SL TABLET    Place 1 tablet (0.4 mg total) under the tongue every 5 (five) minutes as needed for chest pain.   POTASSIUM CHLORIDE  SA (KLOR-CON  M) 20 MEQ TABLET    Take 1 tablet (20 mEq total) by mouth daily. Take with lasix    SERTRALINE  (ZOLOFT ) 100 MG TABLET    Take 1 tablet (100 mg total) by mouth daily.    TAMSULOSIN  (FLOMAX ) 0.4 MG CAPS CAPSULE    Take 2 capsules (0.8 mg total) by mouth daily after breakfast.   TRELEGY ELLIPTA  100-62.5-25 MCG/ACT AEPB    Inhale 1 puff into the lungs daily.   VITAMIN D , ERGOCALCIFEROL , (DRISDOL ) 1.25 MG (50000 UNIT) CAPS CAPSULE    Take 1 capsule (50,000 Units total) by mouth every 7 (seven) days.  Modified Medications   No medications on file  Discontinued Medications   No medications on file    Physical Exam:  Vitals:   06/18/24 1100  BP: 110/62  Pulse: 63  Temp: 97.9 F (36.6 C)  SpO2: 94%  Weight: 173 lb 9.6 oz (78.7 kg)  Height: 5' 11 (1.803 m)   Body mass index is 24.21 kg/m. Wt Readings from Last 3 Encounters:  06/18/24 173 lb 9.6 oz (78.7 kg)  05/20/24 176 lb (79.8 kg)  05/13/24 174 lb 6.4 oz (79.1 kg)    Physical Exam Constitutional:      General: He is not in acute distress.    Appearance: He is well-developed. He is not diaphoretic.  HENT:     Head: Normocephalic and atraumatic.     Right Ear: External ear normal.     Left Ear: External ear normal.     Mouth/Throat:     Pharynx: No oropharyngeal exudate.  Eyes:     Conjunctiva/sclera: Conjunctivae normal.     Pupils: Pupils are equal, round, and reactive to light.  Cardiovascular:     Rate and Rhythm: Normal rate and regular rhythm.     Heart sounds: Normal heart sounds.  Pulmonary:     Effort: Pulmonary effort is normal.     Breath sounds: Normal breath sounds.  Abdominal:     General: Bowel sounds are normal.     Palpations: Abdomen is soft.  Musculoskeletal:        General: No tenderness.     Cervical back: Normal range of motion and neck supple.     Right lower leg: No edema.     Left lower leg: No edema.  Skin:    General: Skin is warm and dry.  Neurological:     Mental Status: He is alert and oriented to person, place, and time.     Labs reviewed: Basic Metabolic Panel: Recent Labs    10/31/23 0750 02/18/24 0953 04/30/24 0735 06/15/24  0730  NA 143   --  143  --   K 3.7  --  3.3*  --   CL 105  --  104  --   CO2 30  --  33*  --   GLUCOSE 101*  --  105*  --   BUN 12  --  18  --   CREATININE 0.88  --  1.03  --   CALCIUM 8.9  --  9.2  --   TSH  --  3.830 6.77* 3.76   Liver Function Tests: Recent Labs    10/31/23 0750 04/30/24 0735  AST 15 21  ALT 12 15  BILITOT 0.7 0.9  PROT 6.4 6.6   No results for input(s): LIPASE, AMYLASE in the last 8760 hours. No results for input(s): AMMONIA in the last 8760 hours. CBC: Recent Labs    09/09/23 0738 10/31/23 0750 04/30/24 0735  WBC 6.2 5.3 5.5  NEUTROABS 3,720 2,979 3,157  HGB 15.4 15.5 14.7  HCT 45.6 46.5 44.1  MCV 98.7 99.8 103.0*  PLT 168 158 162   Lipid Panel: Recent Labs    04/30/24 0735  CHOL 129  HDL 40  LDLCALC 68  TRIG 128  CHOLHDL 3.2   TSH: Recent Labs    02/18/24 0953 04/30/24 0735 06/15/24 0730  TSH 3.830 6.77* 3.76   A1C: Lab Results  Component Value Date   HGBA1C 5.7 (H) 04/30/2024     Assessment/Plan Assessment and Plan Assessment & Plan Shortness of breath Multifactorial etiology including obesity, potential heart failure, and deconditioning. COPD ruled out. - Continue Trelegy as prescribed. - Encourage physical activity to improve lung conditioning.  CHF Contributing to shortness of breath. No recent symptom worsening. - euvolemic, continue current treatment.  - Follow up with cardiologist on June 20, 2024.  Hypothyroidism TSH normalized after stopping amiodarone . Thyroid function stable on current Synthroid  dose. - Continue Synthroid  50 micrograms daily. - Monitor thyroid function as per routine follow-up.  Hypokalemia Previously low potassium, currently on supplementation. - Continue potassium supplement three to four times a week. - BMP to be recheck next week  Vitamin D  deficiency Managed with supplementation. - Continue vitamin D  50,000 units weekly.  Vitamin B12 deficiency Managed with supplementation. B12  aids energy and cognitive function. No recent recheck as it is too soon. - Continue vitamin B12 1000 micrograms daily.    Next appt: 08/05/2024 as scheduled.  Loranda Mastel K. Caro BODILY  [facility] [phone]

## 2024-06-22 LAB — BASIC METABOLIC PANEL WITHOUT GFR
BUN: 11 mg/dL (ref 7–25)
CO2: 34 mmol/L — ABNORMAL HIGH (ref 20–32)
Calcium: 9.1 mg/dL (ref 8.6–10.3)
Chloride: 103 mmol/L (ref 98–110)
Creat: 0.97 mg/dL (ref 0.70–1.22)
Glucose, Bld: 88 mg/dL (ref 65–99)
Potassium: 3.3 mmol/L — ABNORMAL LOW (ref 3.5–5.3)
Sodium: 143 mmol/L (ref 135–146)

## 2024-06-22 NOTE — Progress Notes (Signed)
 Remote PPM Transmission

## 2024-06-23 ENCOUNTER — Ambulatory Visit: Payer: Self-pay | Admitting: Nurse Practitioner

## 2024-06-23 DIAGNOSIS — E876 Hypokalemia: Secondary | ICD-10-CM

## 2024-06-23 NOTE — Progress Notes (Signed)
 Patient stated that he ONLY takes the Potassium 20meq every 3 days. Stated that he will increase it to DAILY and recheck 10/13. Order placed and printed and placed in [facility] tray.

## 2024-06-23 NOTE — Telephone Encounter (Signed)
 Patient notified and agree. Results routed back to [place].   Donzell LABOR Brenly Trawick, CMA 06/23/2024 12:37 PM EDT Back to Top    Patient stated that he ONLY takes the Potassium 20meq every 3 days. Stated that he will increase it to DAILY and recheck 10/13. Order placed and printed and placed in [facility] tray.    Harlene MARLA An, NP 06/23/2024 11:35 AM EDT     Potassium remains low- just to confirm he is taking potassium 20 meq daily? If so to increase potassium to 40 meq daily and lets follow up BMP in 2 weeks at [facility].

## 2024-06-26 ENCOUNTER — Other Ambulatory Visit: Payer: Self-pay | Admitting: Student

## 2024-06-26 DIAGNOSIS — M545 Low back pain, unspecified: Secondary | ICD-10-CM

## 2024-06-26 NOTE — Telephone Encounter (Signed)
 Copied from CRM [id number]. Topic: Clinical - Medication Refill >> Jun 26, 2024  3:21 PM Chiquita SQUIBB wrote: Medication:  lidocaine  lidocaine  (LIDODERM ) 5 %   Has the patient contacted their pharmacy? Yes (Agent: If no, request that the patient contact the pharmacy for the refill. If patient does not wish to contact the pharmacy document the reason why and proceed with request.) (Agent: If yes, when and what did the pharmacy advise?)  This is the patient's preferred pharmacy:  [id number] [address] [facility] - [address] [place] [address] Phone: [phone] Fax: [phone]   Is this the correct pharmacy for this prescription? Yes If no, delete pharmacy and type the correct one.   Has the prescription been filled recently? No  Is the patient out of the medication? Yes  Has the patient been seen for an appointment in the last year OR does the patient have an upcoming appointment? Yes  Can we respond through MyChart? No  Agent: Please be advised that Rx refills may take up to 3 business days. We ask that you follow-up with your pharmacy.

## 2024-06-29 MED ORDER — LIDOCAINE 5 % EX PTCH: 1.0000 | MEDICATED_PATCH | CUTANEOUS | 0 refills | Status: DC

## 2024-07-01 ENCOUNTER — Other Ambulatory Visit: Payer: Self-pay | Admitting: *Deleted

## 2024-07-01 DIAGNOSIS — G8929 Other chronic pain: Secondary | ICD-10-CM

## 2024-07-01 MED ORDER — LIDOCAINE 5 % EX PTCH: 1.0000 | MEDICATED_PATCH | CUTANEOUS | 1 refills | Status: AC

## 2024-07-01 NOTE — Telephone Encounter (Signed)
 Patient requested refill in [facility] with Wife. Oked per Dr. Shary.

## 2024-07-03 ENCOUNTER — Ambulatory Visit: Payer: Self-pay | Admitting: *Deleted

## 2024-07-03 ENCOUNTER — Telehealth: Payer: Self-pay

## 2024-07-03 NOTE — Telephone Encounter (Signed)
 Patients wife called stating patient has a chronic cough worsening this week. Cough is productive although no discoloration, patients wife describes mucous as Opaque.   Patient denies any associated symptoms such as fever, runny nose, or sore throat.   We have no available appointments at [facility] or [facility]. Patient is taking and using all medications (inhalers) as prescribed.   Please advise

## 2024-07-03 NOTE — Telephone Encounter (Signed)
 Pls take Mucinex 600 mg twice a day x 3 days for the cough. Pls make an appointment to urgent care to be evaluated.

## 2024-07-03 NOTE — Telephone Encounter (Signed)
 Duplicate documentation

## 2024-07-03 NOTE — Telephone Encounter (Signed)
 FYI Only or Action Required?: Action required by provider: request for appointment and update on patient condition.  Patient was last seen in primary care on 06/18/2024 by Caro Harlene POUR, NP.  Called Nurse Triage reporting Cough.  Symptoms began a week ago.  Interventions attempted: Rest, hydration, or home remedies.  Symptoms are: gradually worsening.  Triage Disposition: See Physician Within 24 Hours  Patient/caregiver understands and will follow disposition?: No, wishes to speak with PCP   Please advise regarding appt scheduling for cough and  requesting medication . Hx chronic cough and SOB chronic. Patient wife denies chest pain no difficulty breathing for patient at this time. CAL assisting with scheduling .          Copied from CRM [id number]. Topic: Clinical - Red Word Triage >> Jul 03, 2024 11:10 AM Graeme ORN wrote: Red Word that prompted transfer to Nurse Triage: productive cough - opaque not bloody - shortness of breath   ----------------------------------------------------------------------- From previous Reason for Contact - Scheduling: Patient/patient representative is calling to schedule an appointment. Refer to attachments for appointment information. Reason for Disposition  [1] Known COPD or other severe lung disease (i.e., bronchiectasis, cystic fibrosis, lung surgery) AND [2] symptoms getting worse (i.e., increased sputum purulence or amount, increased breathing difficulty  Answer Assessment - Initial Assessment Questions Patient wife requesting appt at [facility] and non available only available appt is in [place]. Patient wife reports she does not drive and needs appt . Recommended UC if appt can not be scheduled and if sx worsen. CAL contacted to assist with scheduling.       1. ONSET: When did the cough begin?     This week  2. SEVERITY: How bad is the cough today?      Getting worse 3. SPUTUM: Describe the color of your sputum (e.g.,  none, dry cough; clear, white, yellow, green)     Denies productive cough  4. HEMOPTYSIS: Are you coughing up any blood? If Yes, ask: How much? (e.g., flecks, streaks, tablespoons, etc.)     Reports as opaque 5. DIFFICULTY BREATHING: Are you having difficulty breathing? If Yes, ask: How bad is it? (e.g., mild, moderate, severe)      SOB is chronic but not severe today  6. FEVER: Do you have a fever? If Yes, ask: What is your temperature, how was it measured, and when did it start?     no 7. CARDIAC HISTORY: Do you have any history of heart disease? (e.g., heart attack, congestive heart failure)      See hx  8. LUNG HISTORY: Do you have any history of lung disease?  (e.g., pulmonary embolus, asthma, emphysema)     See hx  9. PE RISK FACTORS: Do you have a history of blood clots? (or: recent major surgery, recent prolonged travel, bedridden)     Na  10. OTHER SYMPTOMS: Do you have any other symptoms? (e.g., runny nose, wheezing, chest pain)       Cough worsening no productive cough, denies SOB worsening   11. PREGNANCY: Is there any chance you are pregnant? When was your last menstrual period?       na 12. TRAVEL: Have you traveled out of the country in the last month? (e.g., travel history, exposures)       na  Protocols used: Cough - Acute Productive-A-AH

## 2024-07-03 NOTE — Telephone Encounter (Signed)
 Spoke with patients spouse and relayed Monina's message.

## 2024-07-07 LAB — BASIC METABOLIC PANEL WITHOUT GFR
BUN: 17 mg/dL (ref 7–25)
CO2: 29 mmol/L (ref 20–32)
Calcium: 9.5 mg/dL (ref 8.6–10.3)
Chloride: 102 mmol/L (ref 98–110)
Creat: 1.14 mg/dL (ref 0.70–1.22)
Glucose, Bld: 101 mg/dL — ABNORMAL HIGH (ref 65–99)
Potassium: 3.5 mmol/L (ref 3.5–5.3)
Sodium: 140 mmol/L (ref 135–146)

## 2024-07-17 ENCOUNTER — Encounter: Payer: Self-pay | Admitting: Orthopedic Surgery

## 2024-07-17 ENCOUNTER — Non-Acute Institutional Stay: Admitting: Orthopedic Surgery

## 2024-07-17 VITALS — BP 126/78 | HR 73 | Temp 97.6°F | Ht 71.0 in | Wt 173.0 lb

## 2024-07-17 DIAGNOSIS — R0609 Other forms of dyspnea: Secondary | ICD-10-CM | POA: Diagnosis not present

## 2024-07-17 DIAGNOSIS — E039 Hypothyroidism, unspecified: Secondary | ICD-10-CM

## 2024-07-17 DIAGNOSIS — R7303 Prediabetes: Secondary | ICD-10-CM

## 2024-07-17 DIAGNOSIS — I5032 Chronic diastolic (congestive) heart failure: Secondary | ICD-10-CM

## 2024-07-17 DIAGNOSIS — I482 Chronic atrial fibrillation, unspecified: Secondary | ICD-10-CM | POA: Diagnosis not present

## 2024-07-17 MED ORDER — TRELEGY ELLIPTA 100-62.5-25 MCG/ACT IN AEPB: 1.0000 | INHALATION_SPRAY | Freq: Every day | RESPIRATORY_TRACT | 1 refills | Status: AC

## 2024-07-17 NOTE — Patient Instructions (Addendum)
 Recommend drug holiday with Trelegy to see if it is helping   Recommend exercise for about 10 minutes 1-2 times weekly   Recommend scheduling with Dr. Laurence if shortness of breath or fatigue do not improve

## 2024-07-19 NOTE — Progress Notes (Signed)
 Location:  Other Nursing Home Room Number: [facility] Place of Service:  Clinic ([phone]) Provider:  Greig FORBES Cluster, NP   Caro Harlene POUR, NP  Patient Care Team: Caro Harlene POUR, NP as PCP - General (Geriatric Medicine) Perla Evalene PARAS, MD as PCP - Cardiology (Cardiology) Kennyth Chew, MD as PCP - Electrophysiology (Cardiology) Perla Evalene PARAS, MD as Consulting Physician (Cardiology) Maree Genell SAILOR, MD as Referring Physician (Otolaryngology) Francisca Redell BROCKS, MD as Consulting Physician (Urology) Alpha Chloe SAUNDERS, MD as Referring Physician (Dermatology) Pa, [facility]) Isadora Hose, MD as Consulting Physician (Pulmonary Disease) Riddle, Suzann, NP as Nurse Practitioner (Clinical Cardiac Electrophysiology) Loistine Sober, NP as Nurse Practitioner (Cardiology)  Extended Emergency Contact Information Primary Emergency Contact: Osolin-Wilmott,Marriana Address: [address]          [place], [place] [address] [place] Home Phone: [phone] Relation: Spouse  Code Status:  Full code Goals of care: Advanced Directive information    07/17/2024   10:19 AM  Advanced Directives  Does Patient Have a Medical Advance Directive? Yes  Type of Advance Directive Living will  Does patient want to make changes to medical advance directive? No - Patient declined     Chief Complaint  Patient presents with   Medical Management of Chronic Issues    Medical Management of Chronic Issues. 1 Month follow up    HPI:  Pt is a 88 y.o. male seen today for medical management of chronic diseases.    Discussed the use of AI scribe software for clinical note transcription with the patient, who gave verbal consent to proceed.  History of Present Illness   Kyle Morales is an 88 year old male with atrial fibrillation who presents with shortness of breath. He is accompanied by his wife, Kyle Morales.  He experiences shortness of breath with minimal  exertion, such as walking to the car, and sometimes even while sitting. He reports that the pulmonary doctor told him he does not have COPD and recommended Trelegy, which he has been using without noticeable improvement. He did not use Trelegy on the morning of the visit.  He has a history of atrial fibrillation, confirmed by an EKG, and is currently on bisoprolol  (Zebeta ) since April. He notes increased drowsiness since starting this medication. Recent cardiac perfusion test noted LVEF 50% and no acute findings.   He experiences episodes of exhaustion and tiredness, feeling ready to go back to bed. He is on levothyroxine  for thyroid management. Recent TSH 3.76.   He has a history of chronic cough, which has improved significantly with Mucinex, as recommended by a nurse practitioner. He is not currently taking Mucinex.        Past Medical History:  Diagnosis Date   Aortic atherosclerosis    Aortic root dilation    a.) TTE [id number]: Ao root measured 39 mm.   BPH (benign prostatic hyperplasia)    CHF (congestive heart failure) (HCC)    Coronary artery disease    a.) remote PCI of the RI in 1997; stent (unknown type) placed.   Depression    Diverticulosis    Eczema    GERD (gastroesophageal reflux disease)    Hyperlipidemia    Hypertension    Inguinal hernia, left    Long term current use of anticoagulant    a.) apixaban    Nephrolithiasis    NSVT (nonsustained ventricular tachycardia) (HCC)    Osteoarthrosis    Paroxysmal A-fib (HCC)    a.) CHA2DS2VASc =  37 (age x2, CHF, HTN, vascular disease history);  b.) rate/rhythm maintained on oral metoprolol  succinate; chronically anticoagulated with apixaban    Pharyngeal dysphagia    Presence of permanent cardiac pacemaker 2007   a.) Medtronic device placed in [place]  in 2007; b.) generator changed at [facility] on 11/18/2015   RBBB    SSS (sick sinus syndrome) (HCC) 2007   a.) s/p PPM placement in [place]  in 2007   Past Surgical History:   Procedure Laterality Date   BLEPHAROPLASTY Bilateral    CARDIAC CATHETERIZATION  09/25/1995   CARDIAC CATHETERIZATION  09/24/1998   CHOLECYSTECTOMY     COLONOSCOPY  09/24/2010   CORONARY ANGIOPLASTY  09/25/1995   ramus   ESOPHAGOGASTRODUODENOSCOPY N/A 02/11/2017   Procedure: ESOPHAGOGASTRODUODENOSCOPY (EGD);  Surgeon: Gaylyn Gladis PENNER, MD;  Location: [facility] ENDOSCOPY;  Service: Endoscopy;  Laterality: N/A;   INGUINAL HERNIA REPAIR Left 1977   INSERT / REPLACE / REMOVE PACEMAKER  12/23/2005   Medtronic [id number] - placed in [place]    INSERTION OF MESH Left 06/08/2022   Procedure: INSERTION OF MESH;  Surgeon: Lane Shope, MD;  Location: [facility] ORS;  Service: General;  Laterality: Left;   MANDIBLE SURGERY  1966   PACEMAKER GENERATOR CHANGE N/A 11/18/2015   ROBOTIC ASSISTED LAPAROSCOPIC CHOLECYSTECTOMY  01/12/2022   TONSILLECTOMY     TOTAL HIP ARTHROPLASTY Right 2022   TOTAL SHOULDER REPLACEMENT     bilateral approx 2020 and 2021    Allergies  Allergen Reactions   Amoxicillin Rash and Itching   Bactrim [Sulfamethoxazole-Trimethoprim] Rash    Outpatient Encounter Medications as of 07/17/2024  Medication Sig   acetaminophen  (TYLENOL ) 500 MG tablet Take 500 mg by mouth every 8 (eight) hours as needed.   albuterol  (VENTOLIN  HFA) 108 (90 Base) MCG/ACT inhaler Inhale 2 puffs into the lungs every 6 (six) hours as needed for wheezing or shortness of breath.   bisoprolol  (ZEBETA ) 5 MG tablet Take 1 tablet (5 mg total) by mouth daily.   [employer] ZINC  50 MG TABS Take 50 mg by mouth daily.   ELIQUIS  5 MG TABS tablet TAKE 1 TABLET TWICE A DAY   finasteride  (PROSCAR ) 5 MG tablet Take 1 tablet (5 mg total) by mouth daily.   fluticasone  (FLONASE ) 50 MCG/ACT nasal spray Place 2 sprays into both nostrils 2 (two) times daily.   furosemide  (LASIX ) 40 MG tablet Take 3 tablets (120 mg total) by mouth daily. 3 tablets daily alternating with 3 tablets twice daily.   levothyroxine  (SYNTHROID ) 50 MCG  tablet Take 1 tablet (50 mcg total) by mouth daily.   lidocaine  (LIDODERM ) 5 % Place 1 patch onto the skin daily. Remove & Discard patch within 12 hours or as directed by MD   lovastatin  (MEVACOR ) 20 MG tablet Take 1 tablet (20 mg total) by mouth at bedtime.   nitroGLYCERIN  (NITROSTAT ) 0.4 MG SL tablet Place 1 tablet (0.4 mg total) under the tongue every 5 (five) minutes as needed for chest pain.   potassium chloride  SA (KLOR-CON  M) 20 MEQ tablet Take 1 tablet (20 mEq total) by mouth daily. Take with lasix    sertraline  (ZOLOFT ) 100 MG tablet Take 1 tablet (100 mg total) by mouth daily.   tamsulosin  (FLOMAX ) 0.4 MG CAPS capsule Take 2 capsules (0.8 mg total) by mouth daily after breakfast.   Vitamin D , Ergocalciferol , (DRISDOL ) 1.25 MG (50000 UNIT) CAPS capsule Take 1 capsule (50,000 Units total) by mouth every 7 (seven) days.   [DISCONTINUED] TRELEGY ELLIPTA  100-62.5-25 MCG/ACT AEPB Inhale 1 puff into the  lungs daily.   TRELEGY ELLIPTA  100-62.5-25 MCG/ACT AEPB Inhale 1 puff into the lungs daily.   No facility-administered encounter medications on file as of 07/17/2024.    Review of Systems  Immunization History  Administered Date(s) Administered   Fluad Quad(high Dose 65+) 07/25/2021, 07/17/2022   H1N1 09/02/2008   INFLUENZA, HIGH DOSE SEASONAL PF 06/11/2024   Influenza, Seasonal, Injecte, Preservative Fre 08/01/2006, 09/11/2007, 06/10/2008, 07/28/2009, 06/08/2010, 07/12/2011, 08/05/2012, 07/07/2013   Influenza,inj,Quad PF,6+ Mos 08/05/2012, 06/14/2015, 06/22/2016, 08/05/2017, 07/01/2018, 06/09/2019, 06/02/2020   Influenza-Unspecified 06/13/2014, 07/25/2021, 07/17/2022, 07/18/2023   Moderna Covid-19 Fall Seasonal Vaccine 75yrs & older 08/03/2022, 01/01/2023   Moderna Covid-19 Vaccine Bivalent Booster 75yrs & up 06/15/2021, 02/20/2022   Moderna Sars-Covid-2 Vaccination 11/06/2019, 12/04/2019, 08/09/2020, 02/09/2021, 08/03/2022   Pfizer(Comirnaty)Fall Seasonal Vaccine 12 years and older  06/11/2024   Pneumococcal Conjugate-13 06/14/2015   Pneumococcal Polysaccharide-23 08/01/2006, 08/05/2012   RSV,unspecified 07/28/2022   Respiratory Syncytial Virus Vaccine,Recomb Aduvanted(Arexvy) 07/28/2022   Tdap 03/01/2023   Unspecified SARS-COV-2 Vaccination 06/21/2023, 01/03/2024   Zoster Recombinant(Shingrix) 08/07/2019, 10/09/2019   Pertinent  Health Maintenance Due  Topic Date Due   Influenza Vaccine  Completed      05/09/2023   10:05 AM 01/02/2024    9:07 AM 04/29/2024    2:28 PM 05/13/2024    4:02 PM 06/18/2024   11:02 AM  Fall Risk  Falls in the past year? 1 1 0 0 0  Was there an injury with Fall? 0 1 0 0 0  Fall Risk Category Calculator 2 3 0 0 0  Patient at Risk for Falls Due to History of fall(s);Impaired balance/gait History of fall(s);Impaired mobility No Fall Risks;Impaired balance/gait No Fall Risks No Fall Risks  Fall risk Follow up Falls evaluation completed Falls evaluation completed;Education provided;Falls prevention discussed Falls evaluation completed Falls evaluation completed Falls evaluation completed   Functional Status Survey:    Vitals:   07/17/24 1015  BP: 126/78  Pulse: 73  Temp: 97.6 F (36.4 C)  SpO2: 94%  Weight: 173 lb (78.5 kg)  Height: 5' 11 (1.803 m)   Body mass index is 24.13 kg/m. Physical Exam Vitals reviewed.  Constitutional:      General: He is not in acute distress. HENT:     Head: Normocephalic.  Eyes:     General:        Right eye: No discharge.        Left eye: No discharge.  Cardiovascular:     Rate and Rhythm: Normal rate and regular rhythm.     Pulses: Normal pulses.     Heart sounds: Normal heart sounds.  Pulmonary:     Effort: Pulmonary effort is normal.     Breath sounds: Normal breath sounds.  Abdominal:     General: Bowel sounds are normal.     Palpations: Abdomen is soft.  Musculoskeletal:     Cervical back: Neck supple.     Right lower leg: No edema.     Left lower leg: No edema.  Skin:     General: Skin is warm.     Capillary Refill: Capillary refill takes less than 2 seconds.  Neurological:     General: No focal deficit present.     Mental Status: He is alert and oriented to person, place, and time.     Gait: Gait abnormal.  Psychiatric:        Mood and Affect: Mood normal.     Labs reviewed: Recent Labs    04/30/24 0735 06/22/24 0800 07/06/24  1131  NA 143 143 140  K 3.3* 3.3* 3.5  CL 104 103 102  CO2 33* 34* 29  GLUCOSE 105* 88 101*  BUN 18 11 17   CREATININE 1.03 0.97 1.14  CALCIUM 9.2 9.1 9.5   Recent Labs    10/31/23 0750 04/30/24 0735  AST 15 21  ALT 12 15  BILITOT 0.7 0.9  PROT 6.4 6.6   Recent Labs    09/09/23 0738 10/31/23 0750 04/30/24 0735  WBC 6.2 5.3 5.5  NEUTROABS 3,720 2,979 3,157  HGB 15.4 15.5 14.7  HCT 45.6 46.5 44.1  MCV 98.7 99.8 103.0*  PLT 168 158 162   Lab Results  Component Value Date   TSH 3.76 06/15/2024   Lab Results  Component Value Date   HGBA1C 5.7 (H) 04/30/2024   Lab Results  Component Value Date   CHOL 129 04/30/2024   HDL 40 04/30/2024   LDLCALC 68 04/30/2024   TRIG 128 04/30/2024   CHOLHDL 3.2 04/30/2024    Significant Diagnostic Results in last 30 days:  No results found.  Assessment/Plan 1. Dyspnea on exertion (Primary) - recent pulmonary evaluation negative for COPD - started Trelegy> unclear if helping  - advised to continue Trelegy  - discussed physical deconditioning> advised to walk 10 minutes BID and slowly increase  - consider PT consult   2. Chronic atrial fibrillation (HCC) - HR< 100 with bisoprolol  - cont Eliquis  for clot prevention  - TSH stable - CBC with Differential/Platelet; Future - Complete Metabolic Panel with eGFR  3. Hypothyroidism, unspecified type - cont levothyroxine  - TSH  4. Prediabetes - A1c 5.7 04/30/2024 - diet controlled - Hemoglobin A1c  5. Chronic diastolic CHF (congestive heart failure) (HCC) - followed by cardiology - BNP was 199 08/07 -  LVEF 60-65% 02/2024 - weight stable - cont furosemide   Family/ staff Communication: plan discussed with patient   Labs/tests ordered:  routine labs next visit

## 2024-07-22 NOTE — Progress Notes (Signed)
 Remote pacemaker transmission.

## 2024-07-22 NOTE — Addendum Note (Signed)
 Addended by: VICCI SELLER A on: 07/22/2024 09:08 AM   Modules accepted: Orders, Level of Service

## 2024-07-23 ENCOUNTER — Other Ambulatory Visit: Payer: Self-pay

## 2024-07-23 DIAGNOSIS — E559 Vitamin D deficiency, unspecified: Secondary | ICD-10-CM

## 2024-07-23 MED ORDER — VITAMIN D (ERGOCALCIFEROL) 1.25 MG (50000 UNIT) PO CAPS: 50000.0000 [IU] | ORAL_CAPSULE | ORAL | 3 refills | Status: AC

## 2024-07-28 ENCOUNTER — Ambulatory Visit: Payer: Medicare Other

## 2024-07-28 DIAGNOSIS — I480 Paroxysmal atrial fibrillation: Secondary | ICD-10-CM

## 2024-07-28 LAB — CUP PACEART REMOTE DEVICE CHECK
Battery Remaining Longevity: 15 mo
Battery Voltage: 2.9 V
Brady Statistic AP VP Percent: 0.93 %
Brady Statistic AP VS Percent: 97.66 %
Brady Statistic AS VP Percent: 0.03 %
Brady Statistic AS VS Percent: 1.37 %
Brady Statistic RA Percent Paced: 98.06 %
Brady Statistic RV Percent Paced: 1.09 %
Date Time Interrogation Session: 20251104105119
Implantable Lead Connection Status: 753985
Implantable Lead Connection Status: 753985
Implantable Lead Implant Date: 20070411
Implantable Lead Implant Date: 20070411
Implantable Lead Location: 753859
Implantable Lead Location: 753860
Implantable Lead Model: 5076
Implantable Lead Model: 5076
Implantable Pulse Generator Implant Date: 20170224
Lead Channel Impedance Value: 361 Ohm
Lead Channel Impedance Value: 361 Ohm
Lead Channel Impedance Value: 418 Ohm
Lead Channel Impedance Value: 475 Ohm
Lead Channel Pacing Threshold Amplitude: 0.875 V
Lead Channel Pacing Threshold Amplitude: 1.125 V
Lead Channel Pacing Threshold Pulse Width: 0.4 ms
Lead Channel Pacing Threshold Pulse Width: 0.4 ms
Lead Channel Sensing Intrinsic Amplitude: 1.25 mV
Lead Channel Sensing Intrinsic Amplitude: 1.25 mV
Lead Channel Sensing Intrinsic Amplitude: 8.25 mV
Lead Channel Sensing Intrinsic Amplitude: 8.25 mV
Lead Channel Setting Pacing Amplitude: 2 V
Lead Channel Setting Pacing Amplitude: 2.75 V
Lead Channel Setting Pacing Pulse Width: 0.4 ms
Lead Channel Setting Sensing Sensitivity: 0.9 mV
Zone Setting Status: 755011

## 2024-07-29 ENCOUNTER — Ambulatory Visit: Payer: Self-pay | Admitting: Cardiology

## 2024-07-31 NOTE — Progress Notes (Signed)
 Remote PPM Transmission

## 2024-08-05 ENCOUNTER — Encounter: Admitting: Student

## 2024-08-06 ENCOUNTER — Encounter: Admitting: Nurse Practitioner

## 2024-08-27 ENCOUNTER — Ambulatory Visit: Admitting: Podiatry

## 2024-08-27 DIAGNOSIS — Z91198 Patient's noncompliance with other medical treatment and regimen for other reason: Secondary | ICD-10-CM

## 2024-08-27 NOTE — Progress Notes (Signed)
 1. Failure to attend appointment with reason given    Appointment canceled and rescheduled by patient.

## 2024-08-31 ENCOUNTER — Ambulatory Visit: Payer: Self-pay

## 2024-08-31 ENCOUNTER — Encounter: Payer: Self-pay | Admitting: Orthopedic Surgery

## 2024-08-31 ENCOUNTER — Non-Acute Institutional Stay: Admitting: Orthopedic Surgery

## 2024-08-31 VITALS — BP 118/78 | HR 77 | Temp 98.0°F | Resp 22 | Ht 71.0 in | Wt 162.6 lb

## 2024-08-31 DIAGNOSIS — J441 Chronic obstructive pulmonary disease with (acute) exacerbation: Secondary | ICD-10-CM

## 2024-08-31 DIAGNOSIS — R634 Abnormal weight loss: Secondary | ICD-10-CM | POA: Diagnosis not present

## 2024-08-31 DIAGNOSIS — J069 Acute upper respiratory infection, unspecified: Secondary | ICD-10-CM

## 2024-08-31 MED ORDER — AZITHROMYCIN 250 MG PO TABS: ORAL_TABLET | ORAL | 0 refills | Status: AC

## 2024-08-31 MED ORDER — PREDNISONE 20 MG PO TABS: ORAL_TABLET | ORAL | 0 refills | Status: AC

## 2024-08-31 NOTE — Telephone Encounter (Signed)
 FYI Only or Action Required?: FYI only for provider: appointment scheduled on 09/01/24.  Patient was last seen in primary care on 07/17/2024 by Kyle Greig BRAVO, NP.  Called Nurse Triage reporting Shortness of Breath and Cough.  Symptoms began several days ago.  Interventions attempted: OTC medications: Coricidin.  Symptoms are: gradually worsening.  Triage Disposition: See Physician Within 24 Hours (overriding See HCP Within 4 Hours (Or PCP Triage))  Patient/caregiver understands and will follow disposition?: Yes           Copied from CRM [id number]. Topic: Clinical - Red Word Triage >> Aug 31, 2024  8:35 AM Kyle Morales wrote: Red Word that prompted transfer to Nurse Triage: Worsening cold - Shortness of breath - Chest congestion. Pt has history Heart problems.    ----------------------------------------------------------------------- From previous Reason for Contact - Scheduling: Patient/patient representative is calling to schedule an appointment. Refer to attachments for appointment information. Reason for Disposition  [1] MILD difficulty breathing (e.g., minimal/no SOB at rest, SOB with walking, pulse < 100) AND [2] NEW-onset or WORSE than normal  Answer Assessment - Initial Assessment Questions Wife unable to schedule patient for appointments today due to they can not drive and can only attend [facility] clinic. Advised strict UC or ED precautions and she states there is a 24/7 EMT team on campus that she will call if the patient worsens.  1. RESPIRATORY STATUS: Describe your breathing? (e.g., wheezing, shortness of breath, unable to speak, severe coughing)      SOB, productive cough (yellow mucous, no blood).  2. ONSET: When did this breathing problem begin?      08/27/24.  3. PATTERN Does the difficult breathing come and go, or has it been constant since it started?      Comes and goes. SOB noted when coughing spells or when exerting himself.  4. SEVERITY: How bad  is your breathing? (e.g., mild, moderate, severe)      Mild.  5. RECURRENT SYMPTOM: Have you had difficulty breathing before? If Yes, ask: When was the last time? and What happened that time?      Yes, wife states this is the first time this year but he has a history of getting this and having bronchitis or pneumonia.  6. CARDIAC HISTORY: Do you have any history of heart disease? (e.g., heart attack, angina, bypass surgery, angioplasty)      Afib, HTN, CHF, NSVT.  7. LUNG HISTORY: Do you have any history of lung disease?  (e.g., pulmonary embolus, asthma, emphysema)     COPD.  8. CAUSE: What do you think is causing the breathing problem?      Wife states she got him sick, she was sick first with cold like symptoms.  9. OTHER SYMPTOMS: Do you have any other symptoms? (e.g., chest pain, cough, dizziness, fever, runny nose)     No fever, wheezing. Runny nose.  10. O2 SATURATION MONITOR:  Do you use an oxygen saturation monitor (pulse oximeter) at home? If Yes, ask: What is your reading (oxygen level) today? What is your usual oxygen saturation reading? (e.g., 95%)       Yes, low 90s and states his baseline is mid 90s.  Patient has been treating at home with Coricidin, rest, fluids.  Protocols used: Breathing Difficulty-A-AH

## 2024-08-31 NOTE — Patient Instructions (Addendum)
 Prednisone  for wheezing  Antibiotic for suspected upper respiratory infection   Rest and fluids   Continue bland foods  Increase foods with fiber in diet

## 2024-08-31 NOTE — Telephone Encounter (Signed)
 Appointment scheduled with Greig Cluster, NP on 08/31/2024

## 2024-08-31 NOTE — Progress Notes (Signed)
 Location:  Other Nursing Home Room Number: [facility] Place of Service:  Clinic ([id number]) Provider:  Greig FORBES Cluster, NP   Caro Harlene POUR, NP  Patient Care Team: Caro Harlene POUR, NP as PCP - General (Geriatric Medicine) Perla Evalene PARAS, MD as PCP - Cardiology (Cardiology) Kennyth Chew, MD as PCP - Electrophysiology (Cardiology) Perla Evalene PARAS, MD as Consulting Physician (Cardiology) Maree Genell SAILOR, MD as Referring Physician (Otolaryngology) Francisca Redell BROCKS, MD as Consulting Physician (Urology) Alpha Chloe SAUNDERS, MD as Referring Physician (Dermatology) Pa, [facility]) Isadora Hose, MD as Consulting Physician (Pulmonary Disease) Riddle, Suzann, NP as Nurse Practitioner (Clinical Cardiac Electrophysiology) Loistine Sober, NP as Nurse Practitioner (Cardiology)  Extended Emergency Contact Information Primary Emergency Contact: Osolin-Murrell,Marriana Address: [address]          [place], [place] [address] [place] Home Phone: [phone] Relation: Spouse  Code Status:  Full code Goals of care: Advanced Directive information    07/17/2024   10:19 AM  Advanced Directives  Does Patient Have a Medical Advance Directive? Yes  Type of Advance Directive Living will  Does patient want to make changes to medical advance directive? No - Patient declined     Chief Complaint  Patient presents with   Acute Visit    Wheezing, sob    HPI:  Pt is a 88 y.o. male seen today for acute visit due to increased wheezing and shortness of breath.   Discussed the use of AI scribe software for clinical note transcription with the patient, who gave verbal consent to proceed.  History of Present Illness   Kyle Morales is an 88 year old male who presents with cough and wheezing.  He began feeling unwell towards the end of last week. His wife reports having a small cold recently. He developed a persistent productive cough severe enough to  disrupt his sleep. He is also wheezing at rest ands exertion. These symptoms have persisted for several days.  H/o CHF and COPD. He has been taking Coricidin without any relief. He has not been using his albuterol  but taking Trelegy daily. Weight unchanged. No ankle edema. He is not taking diuretic.   He reports a decreased appetite, having consumed very little food recently, including only a small amount of oatmeal yesterday and nothing today. H/o IBS. He has also been experiencing diarrhea over the past few days. His wife mentions that he has been following a bland diet, including foods like oatmeal.        Past Medical History:  Diagnosis Date   Aortic atherosclerosis    Aortic root dilation    a.) TTE [id number]: Ao root measured 39 mm.   BPH (benign prostatic hyperplasia)    CHF (congestive heart failure) (HCC)    Coronary artery disease    a.) remote PCI of the RI in 1997; stent (unknown type) placed.   Depression    Diverticulosis    Eczema    GERD (gastroesophageal reflux disease)    Hyperlipidemia    Hypertension    Inguinal hernia, left    Long term current use of anticoagulant    a.) apixaban    Nephrolithiasis    NSVT (nonsustained ventricular tachycardia) (HCC)    Osteoarthrosis    Paroxysmal A-fib (HCC)    a.) CHA2DS2VASc = 5 (age x2, CHF, HTN, vascular disease history);  b.) rate/rhythm maintained on oral metoprolol  succinate; chronically anticoagulated with apixaban    Pharyngeal dysphagia    Presence of permanent  cardiac pacemaker 2007   a.) [employer] device placed in [place]  in 2007; b.) generator changed at [facility] on 11/18/2015   RBBB    SSS (sick sinus syndrome) (HCC) 2007   a.) s/p PPM placement in [place]  in 2007   Past Surgical History:  Procedure Laterality Date   BLEPHAROPLASTY Bilateral    CARDIAC CATHETERIZATION  09/25/1995   CARDIAC CATHETERIZATION  09/24/1998   CHOLECYSTECTOMY     COLONOSCOPY  09/24/2010   CORONARY ANGIOPLASTY  09/25/1995   ramus    ESOPHAGOGASTRODUODENOSCOPY N/A 02/11/2017   Procedure: ESOPHAGOGASTRODUODENOSCOPY (EGD);  Surgeon: Gaylyn Gladis PENNER, MD;  Location: [facility];  Service: Endoscopy;  Laterality: N/A;   INGUINAL HERNIA REPAIR Left 1977   INSERT / REPLACE / REMOVE PACEMAKER  12/23/2005   Medtronic [id number] - placed in [place]    INSERTION OF MESH Left 06/08/2022   Procedure: INSERTION OF MESH;  Surgeon: Lane Shope, MD;  Location: [facility] ORS;  Service: General;  Laterality: Left;   MANDIBLE SURGERY  1966   PACEMAKER GENERATOR CHANGE N/A 11/18/2015   ROBOTIC ASSISTED LAPAROSCOPIC CHOLECYSTECTOMY  01/12/2022   TONSILLECTOMY     TOTAL HIP ARTHROPLASTY Right 2022   TOTAL SHOULDER REPLACEMENT     bilateral approx 2020 and 2021    Allergies  Allergen Reactions   Amoxicillin Rash and Itching   Bactrim [Sulfamethoxazole-Trimethoprim] Rash    Outpatient Encounter Medications as of 08/31/2024  Medication Sig   azithromycin  (ZITHROMAX ) 250 MG tablet Take 2 tablets on day 1, then 1 tablet daily on days 2 through 5   predniSONE  (DELTASONE ) 20 MG tablet Take 2 tablets (40 mg total) by mouth daily with breakfast for 5 days, THEN 1 tablet (20 mg total) daily with breakfast for 5 days.   acetaminophen  (TYLENOL ) 500 MG tablet Take 500 mg by mouth every 8 (eight) hours as needed.   albuterol  (VENTOLIN  HFA) 108 (90 Base) MCG/ACT inhaler Inhale 2 puffs into the lungs every 6 (six) hours as needed for wheezing or shortness of breath.   bisoprolol  (ZEBETA ) 5 MG tablet Take 1 tablet (5 mg total) by mouth daily.   [employer] ZINC  50 MG TABS Take 50 mg by mouth daily.   ELIQUIS  5 MG TABS tablet TAKE 1 TABLET TWICE A DAY   finasteride  (PROSCAR ) 5 MG tablet Take 1 tablet (5 mg total) by mouth daily.   fluticasone  (FLONASE ) 50 MCG/ACT nasal spray Place 2 sprays into both nostrils 2 (two) times daily.   furosemide  (LASIX ) 40 MG tablet Take 3 tablets (120 mg total) by mouth daily. 3 tablets daily alternating with 3 tablets  twice daily.   levothyroxine  (SYNTHROID ) 50 MCG tablet Take 1 tablet (50 mcg total) by mouth daily.   lidocaine  (LIDODERM ) 5 % Place 1 patch onto the skin daily. Remove & Discard patch within 12 hours or as directed by MD   lovastatin  (MEVACOR ) 20 MG tablet Take 1 tablet (20 mg total) by mouth at bedtime.   nitroGLYCERIN  (NITROSTAT ) 0.4 MG SL tablet Place 1 tablet (0.4 mg total) under the tongue every 5 (five) minutes as needed for chest pain.   potassium chloride  SA (KLOR-CON  M) 20 MEQ tablet Take 1 tablet (20 mEq total) by mouth daily. Take with lasix    sertraline  (ZOLOFT ) 100 MG tablet Take 1 tablet (100 mg total) by mouth daily.   tamsulosin  (FLOMAX ) 0.4 MG CAPS capsule Take 2 capsules (0.8 mg total) by mouth daily after breakfast.   TRELEGY ELLIPTA  100-62.5-25 MCG/ACT AEPB Inhale 1 puff into the  lungs daily.   Vitamin D , Ergocalciferol , (DRISDOL ) 1.25 MG (50000 UNIT) CAPS capsule Take 1 capsule (50,000 Units total) by mouth every 7 (seven) days.   No facility-administered encounter medications on file as of 08/31/2024.    Review of Systems  Constitutional:  Positive for fatigue. Negative for fever.  HENT:  Positive for rhinorrhea. Negative for sore throat and trouble swallowing.   Eyes:  Negative for visual disturbance.  Respiratory:  Positive for cough, shortness of breath and wheezing.   Cardiovascular:  Negative for chest pain and leg swelling.  Gastrointestinal:  Positive for diarrhea. Negative for abdominal distention, abdominal pain, nausea and vomiting.  Genitourinary:  Negative for dysuria.  Musculoskeletal:  Positive for gait problem. Negative for myalgias.  Skin:  Negative for wound.  Neurological:  Positive for weakness. Negative for dizziness and headaches.  Psychiatric/Behavioral:  Negative for dysphoric mood. The patient is not nervous/anxious.     Immunization History  Administered Date(s) Administered   Fluad Quad(high Dose 65+) 07/25/2021, 07/17/2022   H1N1  09/02/2008   INFLUENZA, HIGH DOSE SEASONAL PF 06/11/2024   Influenza, Seasonal, Injecte, Preservative Fre 08/01/2006, 09/11/2007, 06/10/2008, 07/28/2009, 06/08/2010, 07/12/2011, 08/05/2012, 07/07/2013   Influenza,inj,Quad PF,6+ Mos 08/05/2012, 06/14/2015, 06/22/2016, 08/05/2017, 07/01/2018, 06/09/2019, 06/02/2020   Influenza-Unspecified 06/13/2014, 07/25/2021, 07/17/2022, 07/18/2023   Moderna Covid-19 Fall Seasonal Vaccine 62yrs & older 08/03/2022, 01/01/2023   Moderna Covid-19 Vaccine Bivalent Booster 2yrs & up 06/15/2021, 02/20/2022   Moderna Sars-Covid-2 Vaccination 11/06/2019, 12/04/2019, 08/09/2020, 02/09/2021, 08/03/2022   Pfizer(Comirnaty)Fall Seasonal Vaccine 12 years and older 06/11/2024   Pneumococcal Conjugate-13 06/14/2015   Pneumococcal Polysaccharide-23 08/01/2006, 08/05/2012   RSV,unspecified 07/28/2022   Respiratory Syncytial Virus Vaccine,Recomb Aduvanted(Arexvy) 07/28/2022   Tdap 03/01/2023   Unspecified SARS-COV-2 Vaccination 06/21/2023, 01/03/2024   Zoster Recombinant(Shingrix) 08/07/2019, 10/09/2019   Pertinent  Health Maintenance Due  Topic Date Due   Influenza Vaccine  Completed      05/09/2023   10:05 AM 01/02/2024    9:07 AM 04/29/2024    2:28 PM 05/13/2024    4:02 PM 06/18/2024   11:02 AM  Fall Risk  Falls in the past year? 1 1 0 0 0  Was there an injury with Fall? 0  1  0  0  0   Fall Risk Category Calculator 2 3 0 0 0  Patient at Risk for Falls Due to History of fall(s);Impaired balance/gait History of fall(s);Impaired mobility No Fall Risks;Impaired balance/gait No Fall Risks No Fall Risks  Fall risk Follow up Falls evaluation completed Falls evaluation completed;Education provided;Falls prevention discussed Falls evaluation completed Falls evaluation completed Falls evaluation completed     Data saved with a previous flowsheet row definition   Functional Status Survey:    Vitals:   08/31/24 1506  BP: 118/78  Pulse: 77  Resp: (!) 22  Temp: 98 F  (36.7 C)  SpO2: 99%  Weight: 162 lb 9.6 oz (73.8 kg)  Height: 5' 11 (1.803 m)   Body mass index is 22.68 kg/m. Physical Exam Vitals reviewed.  Constitutional:      General: He is not in acute distress. HENT:     Head: Normocephalic.  Eyes:     General:        Right eye: No discharge.        Left eye: No discharge.  Cardiovascular:     Rate and Rhythm: Normal rate and regular rhythm.     Pulses: Normal pulses.     Heart sounds: Normal heart sounds.  Pulmonary:  Effort: Pulmonary effort is normal. No respiratory distress.     Breath sounds: Examination of the right-upper field reveals wheezing and rhonchi. Examination of the left-upper field reveals wheezing and rhonchi. Examination of the right-middle field reveals wheezing. Examination of the left-middle field reveals wheezing. Wheezing and rhonchi present. No rales.  Abdominal:     General: Bowel sounds are normal.     Palpations: Abdomen is soft.  Musculoskeletal:     Cervical back: Neck supple.     Right lower leg: No edema.     Left lower leg: No edema.  Skin:    General: Skin is warm.     Capillary Refill: Capillary refill takes less than 2 seconds.  Neurological:     General: No focal deficit present.     Mental Status: He is alert and oriented to person, place, and time.     Gait: Gait abnormal.     Comments: cane  Psychiatric:        Mood and Affect: Mood normal.     Labs reviewed: Recent Labs    04/30/24 0735 06/22/24 0800 07/06/24 1131  NA 143 143 140  K 3.3* 3.3* 3.5  CL 104 103 102  CO2 33* 34* 29  GLUCOSE 105* 88 101*  BUN 18 11 17   CREATININE 1.03 0.97 1.14  CALCIUM 9.2 9.1 9.5   Recent Labs    10/31/23 0750 04/30/24 0735  AST 15 21  ALT 12 15  BILITOT 0.7 0.9  PROT 6.4 6.6   Recent Labs    09/09/23 0738 10/31/23 0750 04/30/24 0735  WBC 6.2 5.3 5.5  NEUTROABS 3,720 2,979 3,157  HGB 15.4 15.5 14.7  HCT 45.6 46.5 44.1  MCV 98.7 99.8 103.0*  PLT 168 158 162   Lab Results   Component Value Date   TSH 3.76 06/15/2024   Lab Results  Component Value Date   HGBA1C 5.7 (H) 04/30/2024   Lab Results  Component Value Date   CHOL 129 04/30/2024   HDL 40 04/30/2024   LDLCALC 68 04/30/2024   TRIG 128 04/30/2024   CHOLHDL 3.2 04/30/2024    Significant Diagnostic Results in last 30 days:  No results found.  Assessment/Plan 1. COPD with acute exacerbation (HCC) (Primary) - onset 3-4 days  - wheezing noted to upper/middle lobes, increased RR, O2 sats normal - cont Trelegy and albuterol  prn - start prednisone  taper  - predniSONE  (DELTASONE ) 20 MG tablet; Take 2 tablets (40 mg total) by mouth daily with breakfast for 5 days, THEN 1 tablet (20 mg total) daily with breakfast for 5 days.  Dispense: 15 tablet; Refill: 0  2. Upper respiratory tract infection, unspecified type - onset 3-4 days  - afebrile, h/o bronchitis/COPD, productive cough - rapid covid/flu negative - rhonchi to upper lobes  - azithromycin  (ZITHROMAX ) 250 MG tablet; Take 2 tablets on day 1, then 1 tablet daily on days 2 through 5  Dispense: 6 tablet; Refill: 0  3. Weight loss - about 10 lb weight loss - h/o IBS  - admits to poor appetite and IBS  - discussed bland foods  - discussed eating more fiber to bulk up stool   Family/ staff Communication: plan discussed with patient and wife  Labs/tests ordered:  none

## 2024-09-01 ENCOUNTER — Encounter: Admitting: Nurse Practitioner

## 2024-09-01 ENCOUNTER — Encounter: Admitting: Cardiology

## 2024-09-04 ENCOUNTER — Emergency Department
Admission: EM | Admit: 2024-09-04 | Discharge: 2024-09-04 | Disposition: A | Discharge status: Home / Self Care | Attending: Emergency Medicine | Admitting: Emergency Medicine

## 2024-09-04 ENCOUNTER — Telehealth: Payer: Self-pay | Admitting: Cardiovascular Disease

## 2024-09-04 ENCOUNTER — Other Ambulatory Visit: Payer: Self-pay

## 2024-09-04 ENCOUNTER — Emergency Department

## 2024-09-04 DIAGNOSIS — J40 Bronchitis, not specified as acute or chronic: Secondary | ICD-10-CM | POA: Diagnosis not present

## 2024-09-04 DIAGNOSIS — I509 Heart failure, unspecified: Secondary | ICD-10-CM | POA: Diagnosis not present

## 2024-09-04 DIAGNOSIS — I4891 Unspecified atrial fibrillation: Secondary | ICD-10-CM | POA: Insufficient documentation

## 2024-09-04 DIAGNOSIS — R059 Cough, unspecified: Secondary | ICD-10-CM | POA: Diagnosis present

## 2024-09-04 DIAGNOSIS — R079 Chest pain, unspecified: Secondary | ICD-10-CM

## 2024-09-04 DIAGNOSIS — I110 Hypertensive heart disease with heart failure: Secondary | ICD-10-CM | POA: Diagnosis not present

## 2024-09-04 DIAGNOSIS — Z7901 Long term (current) use of anticoagulants: Secondary | ICD-10-CM | POA: Insufficient documentation

## 2024-09-04 DIAGNOSIS — I2510 Atherosclerotic heart disease of native coronary artery without angina pectoris: Secondary | ICD-10-CM | POA: Diagnosis not present

## 2024-09-04 LAB — BASIC METABOLIC PANEL WITH GFR
Anion gap: 11 (ref 5–15)
BUN: 22 mg/dL (ref 8–23)
CO2: 26 mmol/L (ref 22–32)
Calcium: 9.2 mg/dL (ref 8.9–10.3)
Chloride: 102 mmol/L (ref 98–111)
Creatinine, Ser: 0.95 mg/dL (ref 0.61–1.24)
GFR, Estimated: >60 mL/min (ref >60)
Glucose, Bld: 114 mg/dL — ABNORMAL HIGH (ref 70–99)
Potassium: 3.2 mmol/L — ABNORMAL LOW (ref 3.5–5.1)
Sodium: 140 mmol/L (ref 135–145)

## 2024-09-04 LAB — RESP PANEL BY RT-PCR (RSV, FLU A&B, COVID)  RVPGX2
Influenza A by PCR: NEGATIVE
Influenza B by PCR: NEGATIVE
Resp Syncytial Virus by PCR: NEGATIVE
SARS Coronavirus 2 by RT PCR: NEGATIVE

## 2024-09-04 LAB — CBC
HCT: 43.5 % (ref 39.0–52.0)
Hemoglobin: 15.1 g/dL (ref 13.0–17.0)
MCH: 34 pg (ref 26.0–34.0)
MCHC: 34.7 g/dL (ref 30.0–36.0)
MCV: 98 fL (ref 80.0–100.0)
Platelets: 189 K/uL (ref 150–400)
RBC: 4.44 MIL/uL (ref 4.22–5.81)
RDW: 12.9 % (ref 11.5–15.5)
WBC: 9.6 K/uL (ref 4.0–10.5)
nRBC: 0 % (ref 0.0–0.2)

## 2024-09-04 LAB — TROPONIN T, HIGH SENSITIVITY
Troponin T High Sensitivity: 35 ng/L — ABNORMAL HIGH (ref 0–19)
Troponin T High Sensitivity: 33 ng/L — ABNORMAL HIGH (ref 0–19)

## 2024-09-04 MED ORDER — POTASSIUM CHLORIDE CRYS ER 20 MEQ PO TBCR: 40.0000 meq | EXTENDED_RELEASE_TABLET | Freq: Once | ORAL | Status: DC

## 2024-09-04 MED ORDER — IPRATROPIUM-ALBUTEROL 0.5-2.5 (3) MG/3ML IN SOLN
3.0000 mL | Freq: Once | RESPIRATORY_TRACT | Status: AC
  Administered 2024-09-04: 3 mL via RESPIRATORY_TRACT
  Filled 2024-09-04: qty 3

## 2024-09-04 NOTE — ED Notes (Signed)
 Pt given DC instructions. Pt verbalized understanding of follow up care. Pt take from ED in wheelchair by this RN.

## 2024-09-04 NOTE — ED Provider Notes (Signed)
 [facility] Provider Note    Event Date/Time   First MD Initiated Contact with Patient 09/04/24 1503     (approximate)   History   Chief Complaint Cough and Chest Pressure   HPI  Kyle Morales is a 88 y.o. male with past medical history of hypertension, hyperlipidemia, CAD, CHF, atrial fibrillation on Eliquis , and sick sinus syndrome status post pacemaker who presents to the ED complaining of chest pain.  Patient reports that he has been dealing with pressure around the center of his chest constantly since last night.  This has been associated with some dyspnea on exertion but he denies any difficulty breathing at rest.  He has been dealing with a cough productive of clear sputum for about the past 2 weeks, wife had been ill with similar symptoms.  He denies any fevers and has not had any pain or swelling in his legs.  His chest discomfort is not associated with exertion and he denies any active chest discomfort at the time my assessment.      Physical Exam   Triage Vital Signs: ED Triage Vitals  Encounter Vitals Group     BP --      Girls Systolic BP Percentile --      Girls Diastolic BP Percentile --      Boys Systolic BP Percentile --      Boys Diastolic BP Percentile --      Pulse --      Resp --      Temp --      Temp src --      SpO2 09/04/24 1510 99 %     Weight 09/04/24 1508 159 lb (72.1 kg)     Height 09/04/24 1508 5' 11 (1.803 m)     Head Circumference --      Peak Flow --      Pain Score 09/04/24 1509 1     Pain Loc --      Pain Education --      Exclude from Growth Chart --     Most recent vital signs: Vitals:   09/04/24 1510 09/04/24 1552  BP: (!) 111/92   Pulse: 92   Resp: (!) 25   Temp: 98.8 F (37.1 C)   SpO2: 99% 100%    Constitutional: Alert and oriented. Eyes: Conjunctivae are normal. Head: Atraumatic. Nose: No congestion/rhinnorhea. Mouth/Throat: Mucous membranes are moist.  Cardiovascular: Normal rate,  regular rhythm. Grossly normal heart sounds.  2+ radial pulses bilaterally. Respiratory: Normal respiratory effort.  No retractions. Lungs CTAB.  No chest wall tenderness to palpation. Gastrointestinal: Soft and nontender. No distention. Musculoskeletal: No lower extremity tenderness nor edema.  Neurologic:  Normal speech and language. No gross focal neurologic deficits are appreciated.    ED Results / Procedures / Treatments   Labs (all labs ordered are listed, but only abnormal results are displayed) Labs Reviewed  BASIC METABOLIC PANEL WITH GFR - Abnormal; Notable for the following components:      Result Value   Potassium 3.2 (*)    Glucose, Bld 114 (*)    All other components within normal limits  TROPONIN T, HIGH SENSITIVITY - Abnormal; Notable for the following components:   Troponin T High Sensitivity 35 (*)    All other components within normal limits  TROPONIN T, HIGH SENSITIVITY - Abnormal; Notable for the following components:   Troponin T High Sensitivity 33 (*)    All other components within normal limits  RESP PANEL BY RT-PCR (RSV, FLU A&B, COVID)  [id number]  CBC     EKG  ED ECG REPORT I, Carlin Palin, the attending physician, personally viewed and interpreted this ECG.   Date: 09/04/2024  EKG Time: 15:10  Rate: 97  Rhythm: Ventricular paced rhythm  Axis: LAD  Intervals:nonspecific intraventricular conduction delay  ST&T Change: None   ED ECG REPORT I, Carlin Palin, the attending physician, personally viewed and interpreted this ECG.   Date: 09/04/2024  EKG Time: 15:16  Rate: 84  Rhythm: normal sinus rhythm  Axis: Normal  Intervals:first-degree A-V block  and right bundle branch block  ST&T Change: Inferolateral T wave inversions, similar to previous  RADIOLOGY Chest x-ray reviewed and interpreted by me with no infiltrate, edema, or effusion.  PROCEDURES:  Critical Care performed: No  Procedures   MEDICATIONS ORDERED IN ED: Medications   ipratropium-albuterol  (DUONEB) 0.5-2.5 (3) MG/3ML nebulizer solution 3 mL (3 mLs Nebulization Given 09/04/24 1524)     IMPRESSION / MDM / ASSESSMENT AND PLAN / ED COURSE  I reviewed the triage vital signs and the nursing notes.                              88 y.o. male with past medical history of hypertension, hyperlipidemia, CAD, CHF, atrial fibrillation on Eliquis , and sick sinus syndrome status post pacemaker who presents to the ED complaining of 2 weeks of cough now with dyspnea on exertion and chest pressure.  Patient's presentation is most consistent with acute presentation with potential threat to life or bodily function.  Differential diagnosis includes, but is not limited to, ACS, PE, pneumonia, pneumothorax, musculoskeletal pain, GERD, anxiety.  Patient nontoxic-appearing and in no acute distress, vital signs are unremarkable.  Symptoms seem atypical for ACS, more likely to be related to infectious etiology given his recent cough.  Initial EKG read by machine as concerning for acute MI, however it appears to be a ventricular paced rhythm.  Repeat EKG without pacing shows normal sinus rhythm without changes concerning for ischemia.  Chest x-ray and labs are pending at this time, will also check testing for COVID and flu.  We will treat symptomatically with breathing treatment and reassess.  Chest x-ray is unremarkable, labs without significant anemia, leukocytosis, electrolyte abnormality, or AKI.  Troponin mildly elevated but stable on recheck, patient continues to deny any chest pain here in the ED and states that he feels much better following breathing treatment.  Suspect bronchitis, COVID and flu testing is negative.  Patient appropriate for discharge home with outpatient follow-up, was counseled to use albuterol  at home as needed and to return to the ED for new or worsening symptoms.  Patient and spouse agree with plan.      FINAL CLINICAL IMPRESSION(S) / ED DIAGNOSES    Final diagnoses:  Bronchitis  Chest pain, unspecified type     Rx / DC Orders   ED Discharge Orders          Ordered    Ambulatory referral to Cardiology        09/04/24 1849             Note:  This document was prepared using Dragon voice recognition software and may include unintentional dictation errors.   Palin Carlin, MD 09/04/24 [phone]

## 2024-09-04 NOTE — Telephone Encounter (Signed)
 Wife Tony) called to report to Dr. Gollan that patient has been in Afib and is being transported to [facility].

## 2024-09-04 NOTE — ED Triage Notes (Signed)
 Pt BIB EMS from [facility] with complaints of a cough x3 weeks and some chest pressure that started 2 days ago. Pt does have hx of A-fib. Pt has a pacemaker. [employer] is the musician. Pt given 324mg  ASA by EMS.

## 2024-09-06 NOTE — Progress Notes (Unsigned)
 Electrophysiology Clinic Note    Date:  09/07/2024  Patient ID:  Kyle Morales, Kyle Morales 03-May-1936, MRN [id number] PCP:  Caro Harlene POUR, NP  Cardiologist:  Evalene Lunger, MD   Cardiology APP:  Loistine Sober, NP  Electrophysiologist:  Fonda Kitty, MD  Electrophysiology APP:  Estha Few, NP     Discussed the use of AI scribe software for clinical note transcription with the patient, who gave verbal consent to proceed.   Patient Profile    Chief Complaint: AFib follow-up  History of Present Illness: DEVEION Morales is a 88 y.o. male with PMH notable for SND s/p PPM, afib, CAD s/p PCI, parox AFib, NSVT, fatigue; seen today for Fonda Kitty, MD for routine electrophysiology followup.   He last saw Dr. Kitty 01/2024 where he had increased AFib burden, amiodarone  restarted. He saw PCP in follow-up where amiodarone  was again stopped.  I last saw him 04/2024 where he c/o SOB, fatigue. No afib at that time so amio was not restarted.   He was treated by PCP earlier this month for COPD w exacerbation, and seen in ER for chest pain found to be bronchitis, trops flat and symptoms improved w breathing treatment.   On follow-up today, he is quite symptomatic of his AF episodes, beginning in the past week. Historically, he is asymptomatic of his AF the entire time of his diagnosis until this past week. He remains on prednisone  taper for COPD and uses albuterol  inhaler PRN, needing multiple times over the past few weeks.    He continues to take eliquis  BID, no bleeding concerns.      Arrhythmia/Device History Medtronic dual chamber PPM, imp 2007 ([place] ), dx CHB Gen change 10/2015 at [facility]   AAD History: Amiodarone  - stopped d/t no AFib and COPD     ROS:  Please see the history of present illness. All other systems are reviewed and otherwise negative.    Physical Exam    VS:  BP 112/70 (BP Location: Left Arm, Patient Position: Sitting, Cuff Size: Normal)   Pulse 82    Ht 6' (1.829 m)   Wt 165 lb (74.8 kg)   SpO2 96%   BMI 22.38 kg/m  BMI: Body mass index is 22.38 kg/m.      Wt Readings from Last 3 Encounters:  09/07/24 165 lb (74.8 kg)  09/04/24 159 lb (72.1 kg)  08/31/24 162 lb 9.6 oz (73.8 kg)     GEN- The patient is chronically- appearing, alert and oriented x 3 today.   Lungs- hyperresonant throughout, increased work of breathing.  Heart- Regular rate and rhythm, no murmurs, rubs or gallops Extremities- No peripheral edema, warm, dry Skin-  device pocket well-healed, no tethering   Brief check performed without iterative lead testing/measurements Battery 15months Several brief AF episodes over past 5 days, historically low burden w parox AF prior to this    Studies Reviewed   Previous EP, cardiology notes.    EKG is ordered. Personal review of EKG from today shows:   EKG Interpretation Date/Time:  Monday September 07 2024 14:01:37 EST Ventricular Rate:  82 PR Interval:  280 QRS Duration:  112 QT Interval:  414 QTC Calculation: 483 R Axis:   12  Text Interpretation: Sinus rhythm with 1st degree A-V block RSR' or QR pattern in V1 suggests right ventricular conduction delay Confirmed by Ellsworth Waldschmidt [phone]) on 09/07/2024 2:07:37 PM    NM Cardiac PET, 04/16/2024   LV perfusion is normal.  Rest left ventricular function is normal. Rest EF: 50%. Stress left ventricular function is normal. Stress EF: 56%. End diastolic cavity size is normal. End systolic cavity size is normal.   Myocardial blood flow reserve is not reported in this patient due to technical or patient-specific concerns that affect accuracy.   Coronary calcium assessment not performed due to prior revascularization.   The study is normal. The study is low risk.  TTE, 03/31/2024  1. Left ventricular ejection fraction, by estimation, is 60 to 65%. The left ventricle has normal function. The left ventricle has no regional wall motion abnormalities. There is moderate  concentric left ventricular hypertrophy of the apical segment. Left ventricular diastolic parameters are indeterminate.   2. Right ventricular systolic function is normal. The right ventricular size is normal. There is normal pulmonary artery systolic pressure. The estimated right ventricular systolic pressure is 35.9 mmHg.   3. Left atrial size was severely dilated.   4. The mitral valve is normal in structure. Mild mitral valve regurgitation. No evidence of mitral stenosis.   5. The aortic valve is tricuspid. Aortic valve regurgitation is not visualized. Aortic valve sclerosis is present, with no evidence of aortic valve stenosis.   6. There is borderline dilatation of the ascending aorta, measuring 39 mm.   7. The inferior vena cava is normal in size with greater than 50% respiratory variability, suggesting right atrial pressure of 3 mmHg.   TTE, 10/11/22:   1. Left ventricular ejection fraction, by estimation, is 50 to 55%. The left ventricle has low normal function. The left ventricle has no regional wall motion abnormalities. There is moderate to severe left ventricular hypertrophy mid ventricle extending distally, severe in the apical region with near cavity obliteration. Left ventricular diastolic parameters are indeterminate.   2. Right ventricular systolic function is normal. The right ventricular size is normal. There is mildly elevated pulmonary artery systolic pressure. The estimated right ventricular systolic pressure is 36.1 mmHg.   3. Left atrial size was severely dilated.   4. The mitral valve is normal in structure. Mild mitral valve regurgitation. No evidence of mitral stenosis.   5. The aortic valve is tricuspid. Aortic valve regurgitation is not visualized. No aortic stenosis is present.   6. There is borderline dilatation of the ascending aorta, measuring 37 mm.   7. The inferior vena cava is normal in size with greater than 50% respiratory variability, suggesting right atrial  pressure of 3 mmHg.    Assessment and Plan     #) parox AFib #) COPD exacerbation #) bronchitis Increased AFib episodes over past 5 days, likely due to bronchitis and COPD exacerbation.  Do not favor restarting amiodarone  due to COPD/bronchitis  Do not favor initiating Tikosyn due to prolonged QTc. - Monitor AFib burden as lung issues resolve. Consider multaq if continues to have elevated AF burden Continue 5mg  bisoprolol    #) Hypercoag d/t  afib CHA2DS2-VASc Score = at least 4 [CHF History: 1, HTN History: 1, Diabetes History: 0, Stroke History: 0, Vascular Disease History: 0, Age Score: 2, Gender Score: 0].  Therefore, the patient's annual risk of stroke is 4.8 %.    Stroke ppx - 5mg  eliquis  BID, appropriately dosed No bleeding concerns  #) SND s/p PPM Full device check not completed Battery 15months Lead measurements stable Increased AF as above  #) Hypokalemia Increase K supplement to 40meq daily Repeat BMP in about 2 weeks    Current medicines are reviewed at length with the patient today.  The patient does not have concerns regarding his medicines.  The following changes were made today:  none  Labs/ tests ordered today include:  Orders Placed This Encounter  Procedures   Basic metabolic panel with GFR   EKG 87-Ozji     Disposition: Follow up with Dr. Kennyth or EP APP in 2-3 months , continue remote device monitoring   Signed, Chantal Needle, NP  09/07/2024  3:07 PM  Electrophysiology [facility]

## 2024-09-07 ENCOUNTER — Ambulatory Visit: Attending: Cardiology | Admitting: Cardiology

## 2024-09-07 ENCOUNTER — Encounter: Payer: Self-pay | Admitting: Cardiology

## 2024-09-07 ENCOUNTER — Non-Acute Institutional Stay: Admitting: Orthopedic Surgery

## 2024-09-07 ENCOUNTER — Encounter: Payer: Self-pay | Admitting: Orthopedic Surgery

## 2024-09-07 VITALS — BP 124/82 | HR 92 | Temp 97.2°F | Ht 71.0 in | Wt 164.4 lb

## 2024-09-07 VITALS — BP 112/70 | HR 82 | Ht 72.0 in | Wt 165.0 lb

## 2024-09-07 DIAGNOSIS — I509 Heart failure, unspecified: Secondary | ICD-10-CM | POA: Diagnosis present

## 2024-09-07 DIAGNOSIS — Z950 Presence of cardiac pacemaker: Secondary | ICD-10-CM | POA: Diagnosis not present

## 2024-09-07 DIAGNOSIS — J449 Chronic obstructive pulmonary disease, unspecified: Secondary | ICD-10-CM

## 2024-09-07 DIAGNOSIS — I480 Paroxysmal atrial fibrillation: Secondary | ICD-10-CM

## 2024-09-07 DIAGNOSIS — D6869 Other thrombophilia: Secondary | ICD-10-CM

## 2024-09-07 DIAGNOSIS — I495 Sick sinus syndrome: Secondary | ICD-10-CM

## 2024-09-07 DIAGNOSIS — I482 Chronic atrial fibrillation, unspecified: Secondary | ICD-10-CM

## 2024-09-07 DIAGNOSIS — E876 Hypokalemia: Secondary | ICD-10-CM | POA: Diagnosis not present

## 2024-09-07 DIAGNOSIS — J441 Chronic obstructive pulmonary disease with (acute) exacerbation: Secondary | ICD-10-CM

## 2024-09-07 DIAGNOSIS — J40 Bronchitis, not specified as acute or chronic: Secondary | ICD-10-CM | POA: Diagnosis not present

## 2024-09-07 DIAGNOSIS — Z7901 Long term (current) use of anticoagulants: Secondary | ICD-10-CM | POA: Diagnosis not present

## 2024-09-07 MED ORDER — POTASSIUM CHLORIDE CRYS ER 20 MEQ PO TBCR: 20.0000 meq | EXTENDED_RELEASE_TABLET | Freq: Two times a day (BID) | ORAL | 3 refills | Status: DC

## 2024-09-07 MED ORDER — POTASSIUM CHLORIDE CRYS ER 20 MEQ PO TBCR: 40.0000 meq | EXTENDED_RELEASE_TABLET | Freq: Once | ORAL | 3 refills | Status: AC

## 2024-09-07 NOTE — Progress Notes (Signed)
 Location:  Other Nursing Home Room Number: [facility] Place of Service:  Clinic ([phone]) Provider:  Greig FORBES Cluster, NP   Caro Harlene POUR, NP  Patient Care Team: Caro Harlene POUR, NP as PCP - General (Geriatric Medicine) Perla Evalene PARAS, MD as PCP - Cardiology (Cardiology) Kennyth Chew, MD as PCP - Electrophysiology (Cardiology) Perla Evalene PARAS, MD as Consulting Physician (Cardiology) Maree Genell SAILOR, MD as Referring Physician (Otolaryngology) Francisca Redell BROCKS, MD as Consulting Physician (Urology) Alpha Chloe SAUNDERS, MD as Referring Physician (Dermatology) Pa, [facility]) Isadora Hose, MD as Consulting Physician (Pulmonary Disease) Riddle, Suzann, NP as Nurse Practitioner (Clinical Cardiac Electrophysiology) Loistine Sober, NP as Nurse Practitioner (Cardiology)  Extended Emergency Contact Information Primary Emergency Contact: Osolin-Lecuyer,Marriana Address: [address]          [place], [place] [address] [place] Home Phone: [phone] Relation: Spouse  Code Status:  Full code  Goals of care: Advanced Directive information    09/07/2024    3:20 PM  Advanced Directives  Does Patient Have a Medical Advance Directive? Yes  Type of Advance Directive Living will  Does patient want to make changes to medical advance directive? No - Patient declined     Chief Complaint  Patient presents with   Hospitalization Follow-up    Hospital Follow up    HPI:  Pt is a 88 y.o. male seen today for s/p f/u ED visit due to bronchitis.   Discussed the use of AI scribe software for clinical note transcription with the patient, who gave verbal consent to proceed.  History of Present Illness   Morales SHALL is an 88 year old male with congestive heart failure, COPD, and atrial fibrillation who presents with respiratory symptoms and medication management. He is accompanied by his wife.  He presents with respiratory symptoms including  wheezing and shortness of breath. His cough is less frequent but deep when it occurs. He was started on prednisone  and an antibiotic for a possible upper respiratory tract infection last Monday. He reports that the emergency room doctor told him he had bronchitis on Friday, and he did not receive any new prescriptions at that visit.  He uses Trelegy once daily and albuterol  three to four times a day as needed for shortness of breath. He received a breathing treatment in the emergency room but did not notice any difference.  He was seen by cardiology this afternoon due to elevated HR in the ED. He was advised to continue current medication and visit pulmonary to discuss worsening COPD. He has a history of atrial fibrillation and is currently on bisoprolol  and Eliquis . His heart rate has been well-controlled, generally under 100, but occasionally rises to 103-104. He has been on prednisone  for a total of seven days, initially two tablets for five days, then one tablet for two days.  He sleeps between twelve and fifteen hours a day, with five hours of sleep at night. He experienced gastrointestinal side effects from the Z-Pak, which affected his intestinal system.        Past Medical History:  Diagnosis Date   Aortic atherosclerosis    Aortic root dilation    a.) TTE [id number]: Ao root measured 39 mm.   BPH (benign prostatic hyperplasia)    CHF (congestive heart failure) (HCC)    Coronary artery disease    a.) remote PCI of the RI in 1997; stent (unknown type) placed.   Depression    Diverticulosis  Eczema    GERD (gastroesophageal reflux disease)    Hyperlipidemia    Hypertension    Inguinal hernia, left    Long term current use of anticoagulant    a.) apixaban    Nephrolithiasis    NSVT (nonsustained ventricular tachycardia) (HCC)    Osteoarthrosis    Paroxysmal A-fib (HCC)    a.) CHA2DS2VASc = 5 (age x2, CHF, HTN, vascular disease history);  b.) rate/rhythm maintained on oral  metoprolol  succinate; chronically anticoagulated with apixaban    Pharyngeal dysphagia    Presence of permanent cardiac pacemaker 2007   a.) Medtronic device placed in [place]  in 2007; b.) generator changed at [facility] on 11/18/2015   RBBB    SSS (sick sinus syndrome) (HCC) 2007   a.) s/p PPM placement in [place]  in 2007   Past Surgical History:  Procedure Laterality Date   BLEPHAROPLASTY Bilateral    CARDIAC CATHETERIZATION  09/25/1995   CARDIAC CATHETERIZATION  09/24/1998   CHOLECYSTECTOMY     COLONOSCOPY  09/24/2010   CORONARY ANGIOPLASTY  09/25/1995   ramus   ESOPHAGOGASTRODUODENOSCOPY N/A 02/11/2017   Procedure: ESOPHAGOGASTRODUODENOSCOPY (EGD);  Surgeon: Gaylyn Gladis PENNER, MD;  Location: [facility]COPY;  Service: Endoscopy;  Laterality: N/A;   INGUINAL HERNIA REPAIR Left 1977   INSERT / REPLACE / REMOVE PACEMAKER  12/23/2005   Medtronic [id number] - placed in [place]    INSERTION OF MESH Left 06/08/2022   Procedure: INSERTION OF MESH;  Surgeon: Lane Shope, MD;  Location: [facility] ORS;  Service: General;  Laterality: Left;   MANDIBLE SURGERY  1966   PACEMAKER GENERATOR CHANGE N/A 11/18/2015   ROBOTIC ASSISTED LAPAROSCOPIC CHOLECYSTECTOMY  01/12/2022   TONSILLECTOMY     TOTAL HIP ARTHROPLASTY Right 2022   TOTAL SHOULDER REPLACEMENT     bilateral approx 2020 and 2021    Allergies[1]  Outpatient Encounter Medications as of 09/07/2024  Medication Sig   acetaminophen  (TYLENOL ) 500 MG tablet Take 500 mg by mouth every 8 (eight) hours as needed.   albuterol  (VENTOLIN  HFA) 108 (90 Base) MCG/ACT inhaler Inhale 2 puffs into the lungs every 6 (six) hours as needed for wheezing or shortness of breath.   bisoprolol  (ZEBETA ) 5 MG tablet Take 1 tablet (5 mg total) by mouth daily.   [employer] ZINC  50 MG TABS Take 50 mg by mouth daily.   ELIQUIS  5 MG TABS tablet TAKE 1 TABLET TWICE A DAY   finasteride  (PROSCAR ) 5 MG tablet Take 1 tablet (5 mg total) by mouth daily.   fluticasone  (FLONASE ) 50  MCG/ACT nasal spray Place 2 sprays into both nostrils 2 (two) times daily.   furosemide  (LASIX ) 40 MG tablet Take 3 tablets (120 mg total) by mouth daily. 3 tablets daily alternating with 3 tablets twice daily.   levothyroxine  (SYNTHROID ) 50 MCG tablet Take 1 tablet (50 mcg total) by mouth daily.   lidocaine  (LIDODERM ) 5 % Place 1 patch onto the skin daily. Remove & Discard patch within 12 hours or as directed by MD   lovastatin  (MEVACOR ) 20 MG tablet Take 1 tablet (20 mg total) by mouth at bedtime.   nitroGLYCERIN  (NITROSTAT ) 0.4 MG SL tablet Place 1 tablet (0.4 mg total) under the tongue every 5 (five) minutes as needed for chest pain.   potassium chloride  SA (KLOR-CON  M) 20 MEQ tablet Take 2 tablets (40 mEq total) by mouth once.   predniSONE  (DELTASONE ) 20 MG tablet Take 2 tablets (40 mg total) by mouth daily with breakfast for 5 days, THEN 1 tablet (20 mg total) daily  with breakfast for 5 days.   sertraline  (ZOLOFT ) 100 MG tablet Take 1 tablet (100 mg total) by mouth daily.   tamsulosin  (FLOMAX ) 0.4 MG CAPS capsule Take 2 capsules (0.8 mg total) by mouth daily after breakfast.   TRELEGY ELLIPTA  100-62.5-25 MCG/ACT AEPB Inhale 1 puff into the lungs daily.   Vitamin D , Ergocalciferol , (DRISDOL ) 1.25 MG (50000 UNIT) CAPS capsule Take 1 capsule (50,000 Units total) by mouth every 7 (seven) days.   [DISCONTINUED] potassium chloride  SA (KLOR-CON  M) 20 MEQ tablet Take 1 tablet (20 mEq total) by mouth daily. Take with lasix    [DISCONTINUED] potassium chloride  SA (KLOR-CON  M) 20 MEQ tablet Take 1 tablet (20 mEq total) by mouth 2 (two) times daily.   No facility-administered encounter medications on file as of 09/07/2024.    Review of Systems  Constitutional:  Negative for fatigue and fever.  HENT:  Negative for sore throat and trouble swallowing.   Eyes:  Negative for visual disturbance.  Respiratory:  Positive for cough and shortness of breath. Negative for wheezing.   Cardiovascular:  Negative  for chest pain and leg swelling.  Gastrointestinal:  Negative for abdominal distention and abdominal pain.  Genitourinary:  Negative for dysuria.  Musculoskeletal:  Positive for gait problem.  Skin:  Negative for wound.  Neurological:  Negative for dizziness and headaches.  Psychiatric/Behavioral:  Negative for dysphoric mood. The patient is not nervous/anxious.     Immunization History  Administered Date(s) Administered   Fluad Quad(high Dose 65+) 07/25/2021, 07/17/2022   H1N1 09/02/2008   INFLUENZA, HIGH DOSE SEASONAL PF 06/11/2024   Influenza, Seasonal, Injecte, Preservative Fre 08/01/2006, 09/11/2007, 06/10/2008, 07/28/2009, 06/08/2010, 07/12/2011, 08/05/2012, 07/07/2013   Influenza,inj,Quad PF,6+ Mos 08/05/2012, 06/14/2015, 06/22/2016, 08/05/2017, 07/01/2018, 06/09/2019, 06/02/2020   Influenza-Unspecified 06/13/2014, 07/25/2021, 07/17/2022, 07/18/2023   Moderna Covid-19 Fall Seasonal Vaccine 16yrs & older 08/03/2022, 01/01/2023   Moderna Covid-19 Vaccine Bivalent Booster 58yrs & up 06/15/2021, 02/20/2022   Moderna Sars-Covid-2 Vaccination 11/06/2019, 12/04/2019, 08/09/2020, 02/09/2021, 08/03/2022   Pfizer(Comirnaty)Fall Seasonal Vaccine 12 years and older 06/11/2024   Pneumococcal Conjugate-13 06/14/2015   Pneumococcal Polysaccharide-23 08/01/2006, 08/05/2012   RSV,unspecified 07/28/2022   Respiratory Syncytial Virus Vaccine,Recomb Aduvanted(Arexvy) 07/28/2022   Tdap 03/01/2023   Unspecified SARS-COV-2 Vaccination 06/21/2023, 01/03/2024   Zoster Recombinant(Shingrix) 08/07/2019, 10/09/2019   Pertinent  Health Maintenance Due  Topic Date Due   Influenza Vaccine  Completed      04/29/2024    2:28 PM 05/13/2024    4:02 PM 06/18/2024   11:02 AM 08/31/2024    3:41 PM 09/07/2024    3:20 PM  Fall Risk  Falls in the past year? 0 0 0 1 0  Was there an injury with Fall? 0  0  0  0 0  Fall Risk Category Calculator 0 0 0 1 0  Patient at Risk for Falls Due to No Fall Risks;Impaired  balance/gait No Fall Risks No Fall Risks Impaired balance/gait Impaired balance/gait  Fall risk Follow up Falls evaluation completed Falls evaluation completed Falls evaluation completed Falls evaluation completed Falls evaluation completed     Data saved with a previous flowsheet row definition   Functional Status Survey:    Vitals:   09/07/24 1518  BP: 124/82  Pulse: 92  Temp: (!) 97.2 F (36.2 C)  SpO2: 98%  Weight: 164 lb 6.4 oz (74.6 kg)  Height: 5' 11 (1.803 m)   Body mass index is 22.93 kg/m. Physical Exam Vitals reviewed.  Constitutional:      General: He is not  in acute distress. HENT:     Head: Normocephalic.  Eyes:     General:        Right eye: No discharge.        Left eye: No discharge.  Cardiovascular:     Rate and Rhythm: Normal rate and regular rhythm.     Pulses: Normal pulses.     Heart sounds: Normal heart sounds.  Pulmonary:     Effort: Pulmonary effort is normal.     Breath sounds: Normal breath sounds.  Abdominal:     General: Bowel sounds are normal.     Palpations: Abdomen is soft.  Musculoskeletal:     Cervical back: Neck supple.     Right lower leg: No edema.     Left lower leg: No edema.  Skin:    General: Skin is warm.     Capillary Refill: Capillary refill takes less than 2 seconds.  Neurological:     General: No focal deficit present.     Mental Status: He is alert and oriented to person, place, and time.  Psychiatric:        Mood and Affect: Mood normal.     Labs reviewed: Recent Labs    06/22/24 0800 07/06/24 1131 09/04/24 1519  NA 143 140 140  K 3.3* 3.5 3.2*  Kyle 103 102 102  CO2 34* 29 26  GLUCOSE 88 101* 114*  BUN 11 17 22   CREATININE 0.97 1.14 0.95  CALCIUM 9.1 9.5 9.2   Recent Labs    10/31/23 0750 04/30/24 0735  AST 15 21  ALT 12 15  BILITOT 0.7 0.9  PROT 6.4 6.6   Recent Labs    09/09/23 0738 10/31/23 0750 04/30/24 0735 09/04/24 1519  WBC 6.2 5.3 5.5 9.6  NEUTROABS 3,720 2,979 3,157  --    HGB 15.4 15.5 14.7 15.1  HCT 45.6 46.5 44.1 43.5  MCV 98.7 99.8 103.0* 98.0  PLT 168 158 162 189   Lab Results  Component Value Date   TSH 3.76 06/15/2024   Lab Results  Component Value Date   HGBA1C 5.7 (H) 04/30/2024   Lab Results  Component Value Date   CHOL 129 04/30/2024   HDL 40 04/30/2024   LDLCALC 68 04/30/2024   TRIG 128 04/30/2024   CHOLHDL 3.2 04/30/2024    Significant Diagnostic Results in last 30 days:  DG Chest 2 View Result Date: 09/04/2024 EXAM: 2 VIEW(S) XRAY OF THE CHEST 09/04/2024 04:04:00 PM COMPARISON: 09/05/2023 CLINICAL HISTORY: chest pressure FINDINGS: LUNGS AND PLEURA: Patchy right basilar atelectasis. Mildly elevated right hemidiaphragm as before. No pleural effusion. No pneumothorax. HEART AND MEDIASTINUM: Stable left dual lead pacemaker. Aortic atherosclerosis. BONES AND SOFT TISSUES: Bilateral shoulder arthroplasty hardware partially visualized. No acute osseous abnormality. IMPRESSION: 1. No acute cardiopulmonary process. Electronically signed by: Dayne Hassell MD 09/04/2024 04:29 PM EST RP Workstation: [id number]    Assessment/Plan 1. Bronchitis (Primary) - 12/12 ED visit> CXR no acute cardiopulmonary process  - 12/08 started on prednisone  taper and azithrymycin - improved today - no wheezing on exam  2. COPD with acute exacerbation (HCC) - see above - 12/15 cardiology recommended pulmonary evaluation - cont Trelegy and albuterol  prn  3. Chronic atrial fibrillation (HCC) - followed by cardiology - 12/12 Ed visit> NSR - TSH 3.76 06/15/2024 - cont bisoprolol  and Eliquis    4. Current use of long term anticoagulation - cont Eliquis      Family/ staff Communication: plan discussed with patient and wife   Labs/tests  ordered:  none       [1]  Allergies Allergen Reactions   Amoxicillin Rash and Itching   Bactrim [Sulfamethoxazole-Trimethoprim] Rash

## 2024-09-07 NOTE — Patient Instructions (Signed)
 Medication Instructions:  Your physician recommends the following medication changes.  INCREASE: Potassium to TWO tabs once a day.  *If you need a refill on your cardiac medications before your next appointment, please call your pharmacy*  Lab Work: Your provider would like for you to return in 2 weeks to have the following labs drawn: BMP.   Please go to [facility] [address] ([facility] [address], [place] [address] You do not need an appointment.  They are open from 8 am- 4:30 pm.  Lunch from 1:00 pm- 2:00 pm You not need to be fasting.   You may also go to one of the following [facility]:  [address] [place], [place] [address] Phone: [phone] Lab hours: Mon-Fri 8 am- 5 pm    Lunch 12 pm- 1 pm  [address] [place],  [place]  [address]  [place]  Phone: [phone] Lab hours: 7 am- 4 pm Lunch 12 pm-1 pm   [address] [place],  [place]  [address]  [place]  Phone: [phone] Lab hours: Mon-Fri 8 am- 5 pm    Lunch 12 pm- 1 pm    Testing/Procedures: No test ordered today   Follow-Up: At [facility], you and your health needs are our priority.  As part of our continuing mission to provide you with exceptional heart care, our providers are all part of one team.  This team includes your primary Cardiologist (physician) and Advanced Practice Providers or APPs (Physician Assistants and Nurse Practitioners) who all work together to provide you with the care you need, when you need it.  Your next appointment:   2 month(s)  Provider:   Suzann Riddle, NP

## 2024-09-07 NOTE — Patient Instructions (Addendum)
 Call [facility] >  lung doctor Dr. Isadora about worsening shortness of breath> [phone]  Continue albuterol  as needed for shortness of breath or wheezing  Continue Trelegy for breathing issues   Continue bisoprolol  and Eliquis  for atrial fibrillation

## 2024-09-16 ENCOUNTER — Other Ambulatory Visit: Payer: Self-pay | Admitting: *Deleted

## 2024-09-16 DIAGNOSIS — F419 Anxiety disorder, unspecified: Secondary | ICD-10-CM

## 2024-09-16 MED ORDER — SERTRALINE HCL 100 MG PO TABS: 100.0000 mg | ORAL_TABLET | Freq: Every day | ORAL | 1 refills | Status: AC

## 2024-09-16 NOTE — Telephone Encounter (Signed)
[facility] requested refill.  

## 2024-09-17 ENCOUNTER — Other Ambulatory Visit: Payer: Self-pay | Admitting: Cardiovascular Disease

## 2024-09-18 ENCOUNTER — Other Ambulatory Visit: Payer: Self-pay

## 2024-09-18 DIAGNOSIS — N401 Enlarged prostate with lower urinary tract symptoms: Secondary | ICD-10-CM

## 2024-09-18 MED ORDER — FINASTERIDE 5 MG PO TABS: 5.0000 mg | ORAL_TABLET | Freq: Every day | ORAL | 1 refills | Status: AC

## 2024-09-22 ENCOUNTER — Other Ambulatory Visit: Payer: Self-pay

## 2024-09-22 DIAGNOSIS — R062 Wheezing: Secondary | ICD-10-CM

## 2024-09-22 MED ORDER — ALBUTEROL SULFATE HFA 108 (90 BASE) MCG/ACT IN AERS: 2.0000 | INHALATION_SPRAY | Freq: Four times a day (QID) | RESPIRATORY_TRACT | 3 refills | Status: AC | PRN

## 2024-09-22 NOTE — Telephone Encounter (Signed)
 Rx refill sent to the pharamcy

## 2024-09-28 ENCOUNTER — Telehealth: Payer: Self-pay

## 2024-09-28 NOTE — Telephone Encounter (Signed)
 I spoke with Ms. Kyle Morales, Spouse to [name] as a gentle reminder that [name] is due for a lab recheck. Patient's wife confirmed that they plan on coming to the lab this week.

## 2024-10-01 NOTE — Progress Notes (Signed)
 "  Cardiology Clinic Note   Date: 10/02/2024 ID: Kyle Morales, DOB 1935-11-03, MRN [id number]   HeartCare Providers Cardiologist:  Evalene Lunger, MD Cardiology APP:  Loistine Sober, NP  Electrophysiologist:  Fonda Kitty, MD  Electrophysiology APP:  Riddle, Suzann, NP     Chief Complaint   Kyle Morales is a 89 y.o. male who presents to the clinic today for routine follow-up.  Patient Profile   Kyle Morales is followed by Dr. Gollan and Dr. Kitty for the history outlined below.       Past medical history significant for: CAD. Stent placement to ramus 1997 performed at [facility]. Cardiac PET/CT 04/16/2024: Normal, low risk study. Chronic diastolic heart failure. Echo 03/31/2024: EF 60 to 65%.  No RWMA.  Moderate concentric LVH of the apical segment.  Indeterminate diastolic parameters.  Normal RV size/function.  Normal PA pressure, RVSP 35.9 mmHg.  Severe LAE.  Mild MR.  Aortic valve sclerosis without stenosis.  Borderline dilatation of ascending aorta 39 mm. PAF. NSVT. Heart block. S/p PPM placement 2007 performed in [place] . Generator change out February 2017 at [facility]. Remote device check 07/28/2024: Normal device function.  Battery and lead parameters stable with stable capture and sensing.  Appropriate device programming. Hypertension. Hyperlipidemia. GERD.  In summary, patient establish care with Dr. Gollan on 03/12/2013.  He has a history of A-fib dating back to 59.  He reported a history of CAD with stent placement in 1997 performed at [facility].  He had a permanent pacemaker placed in [place]  in 2007 for heart block.  He reported normal EF at that time.  Last EP evaluation in December 2013 at [facility] demonstrated 3.1% A-fib burden (increased from 1.4%).  Nuclear stress test October 2015 was a low risk study with no significant ischemia.  Echo in June 2018 demonstrated EF 50 to 55%, no RWMA, aortic root 39 mm, mild to moderate MR, severe LAE, technically difficult study  unable to evaluate LV diastolic function.  Patient establish care with EP in May 2018 to follow pacemaker.  Patient was seen in the office by Dr. Gollan on 02/11/2024 for routine follow-up.  He reported worsening dyspnea and edema.  It was recommended he increase Lasix  120 mg twice a day for 3 days then add 120 mg of Lasix  in the afternoon every other day in addition to his regular morning dose.  Plan to consider Myoview if no improvement in shortness of breath.  Patient was seen by Dr. Kitty on 02/18/2024 for increased episodes of A-fib per pacemaker check.  He reported worsening shortness of breath when sitting and watching TV that worsened with physical activity.  Given severe diastolic dysfunction it was felt patient was unlikely to tolerate lack of atrial contractility and rhythm control was initiated.  He was started on amiodarone .  Upon follow-up on 03/16/2024 patient reported continued DOE with minimal exertion with associated left-sided chest pressure.  He reported lower extremity edema improved since medication changes.  He underwent PET stress which was a normal, low risk study.  Follow-up on EP follow-up in August 2025 patient reported stopping amiodarone  at the recommendation of his PCP.  Continuing amiodarone  and treating hypothyroid was discussed with patient opted to stop amiodarone  and reassess.  Patient was last seen in the office by Suzann Riddle, NP on 09/07/2024 for routine follow-up.  He reported symptomatic A-fib episodes x 1 week.  He had been seen by PCP in the ED for COPD exacerbation.  In the ED he was  found to have bronchitis with improvement of symptoms after breathing treatment.  At the time of his office visit he remained on a prednisone  taper and was using albuterol  inhaler multiple times over the past weeks.  He was in sinus rhythm at the time of his visit.  Given the patient's COPD/bronchitis amiodarone  was not restarted.     History of Present Illness    Today, patient  initially presents alone.  He is joined by his wife later in the visit.  He reports doing well overall. Patient denies orthopnea or PND.  He has chronic R>L lower extremity edema.  He reported occasional shortness of breath at rest and some minimal DOE that has been longstanding it does not worry me.  No chest pain, pressure.  He has a history of A-fib dating back to 25.  Up until a few months ago he had no cardiac awareness of arrhythmia.  He now has episodes where he feels left-sided chest tightness and a sensation of his heart racing.  When he checks his heart rate with a pulse ox he can be as high as 105 bpm.  He tries to control his breathing heart rate will improve into the 70s to 80s and chest tightness will resolve.  He reports he has a medication that was given to him a long time ago and he cannot take if his heart rate remains  > 100 bpm for longer than a few seconds.  He has not filled this medication for quite some time and has a supply of it at home.  He has taken it twice in the last 3 to 4 months.  Upon review of his chart he believes the medication may be Bystolic  but he is uncertain.  Discussed contacting the office to provide the name of the medication so he can be given further instructions about taking it as needed.  He wonders why this has changed as he was otherwise unaware of going in and out of A-fib.  He also shares that he has increased fatigue over the last month.  He states the smallest amount of exertion will tire him out.  He has been recovering from bronchitis and states it is finally getting better.  He states he sleeps well through the night other than getting up 2 times to urinate.  He shares that he is a big napper and has always taken a 20 to 30 minutes once a day for most of his life.  However, in the last month or so he has increased his naps to 20 to 30 minutes naps he typically feels better after that.  He endorses poor balance.  He states in the mornings he has to sit  on the side of the bed and perform stretching and static exercises before getting up for his balance will be terrible.  He is walking with a cane for stability.    ROS: All other systems reviewed and are otherwise negative except as noted in History of Present Illness.  EKGs/Labs Reviewed       EKG is not performed today.  04/30/2024: ALT 15; AST 21 09/04/2024: BUN 22; Creatinine, Ser 0.95; Potassium 3.2; Sodium 140   09/04/2024: Hemoglobin 15.1; WBC 9.6   06/15/2024: TSH 3.76   04/30/2024: Brain Natriuretic Peptide 199    Risk Assessment/Calculations     CHA2DS2-VASc Score = 4   This indicates a 4.8% annual risk of stroke. The patient's score is based upon: CHF History: 1 HTN History: 1 Diabetes History:  0 Stroke History: 0 Vascular Disease History: 0 Age Score: 2 Gender Score: 0             Physical Exam    VS:  BP 118/76 (BP Location: Left Arm, Patient Position: Sitting, Cuff Size: Normal)   Pulse 93   Resp (!) 22   Ht 5' 11 (1.803 m)   Wt 176 lb 9.6 oz (80.1 kg)   SpO2 96%   BMI 24.63 kg/m  , BMI Body mass index is 24.63 kg/m.  GEN: Well nourished, well developed, in no acute distress. Neck: No JVD or carotid bruits. Cardiac:  RRR.  No murmur. No rubs or gallops.   Respiratory:  Respirations regular and unlabored. Clear to auscultation without rales, wheezing or rhonchi. GI: Soft, nontender, nondistended. Extremities: Radials/DP/PT 2+ and equal bilaterally. No clubbing or cyanosis.  Mild, nonpitting R>L lower extremity edema. Skin: Warm and dry, no rash. Neuro: Strength intact.  Assessment & Plan   CAD Stent placement to ramus 1997 performed at [facility].  Normal, low risk cardiac PET/CT July 2025.  Patient reports he typically does not have chest pain or pressure.  He will experience chest tightness with palpitations (see below).  EKG from 12/15 reviewed and does not show acute changes. - Continue bisoprolol , lovastatin , as needed SL NTG.  On aspirin  secondary to Eliquis .   Chronic diastolic heart failure Echo July 2025 demonstrated normal LV/RV function, normal PA pressure, severe LAE, mild MR, aortic valve sclerosis without stenosis, borderline dilatation of ascending aorta 39 mm.  Patient reports chronic mild shortness of breath at rest and occasional dyspnea with heavier exertion that has been a longstanding issue for him and is unchanged.  Euvolemic and well compensated on exam.  Mild, nonpitting R>L lower extremity edema. - Continue bisoprolol , Lasix .   PAF Onset dating back to 69.  Patient with recent increase in A-fib burden and started on amiodarone  late May 2025.  Amiodarone  was discontinued by PCP and not restarted secondary to patient's COPD and history of bronchitis. Denies spontaneous bleeding concerns. Patient reports for decades he has had no cardiac awareness of A-fib.  In recent months he has noted episodes of left-sided chest tightness and a sensation of his heart racing.  He will check his heart rate with a pulse ox which demonstrates HR 105 bpm.  Episodes will last minutes.  When his heart rate returns to his normal 70 to 80 bpm chest tightness resolves.  Patient states he has a medication that he received a long time ago that he takes if his heart rate remains elevated.  He has not filled this medication in the pharmacy for quite some time and does not recall the name.  His chart was reviewed and is possible the medication is Bystolic .  He is instructed to contact the office with the name of the medication so he can receive further instructions on taking it.  RRR on exam today.  Of note, during visit patient reported an episode of chest tightness and sensation of racing.  Upon examination his rhythm remained regular.  The episode was very brief lasting only seconds. -Continue bisoprolol , Eliquis . Appropriate Eliquis  dose.   Heart block S/p PPM placement 2007 in [place] , GEN change out February 2017 at [facility].  Remote device check  November 2025 showed normal device function. Patient denies lightheadedness, dizziness, presyncope, syncope. - Continue to follow with EP.  Poor balance Patient reports in the mornings he sits on the side of his bed and performs stretching  and static exercises before getting up to help improve his balance.  He states if he does not do the exercises his balance is terrible.  However, he also reports balance is typically not good and he now walks with a cane for stability. - Refer to physical therapy for balance improvement.  Disposition: Refer to physical therapy for balance.  Return to general cardiology in 6 months or sooner as needed.         Signed, Barnie HERO. Saafir Abdullah, DNP, NP-C  "

## 2024-10-02 ENCOUNTER — Other Ambulatory Visit: Payer: Self-pay | Admitting: Emergency Medicine

## 2024-10-02 ENCOUNTER — Ambulatory Visit: Attending: Student | Admitting: Student

## 2024-10-02 ENCOUNTER — Encounter: Payer: Self-pay | Admitting: Student

## 2024-10-02 VITALS — BP 118/76 | HR 93 | Resp 22 | Ht 71.0 in | Wt 176.6 lb

## 2024-10-02 DIAGNOSIS — I5032 Chronic diastolic (congestive) heart failure: Secondary | ICD-10-CM | POA: Diagnosis present

## 2024-10-02 DIAGNOSIS — R2689 Other abnormalities of gait and mobility: Secondary | ICD-10-CM | POA: Diagnosis not present

## 2024-10-02 DIAGNOSIS — E876 Hypokalemia: Secondary | ICD-10-CM

## 2024-10-02 DIAGNOSIS — Z950 Presence of cardiac pacemaker: Secondary | ICD-10-CM | POA: Insufficient documentation

## 2024-10-02 DIAGNOSIS — I480 Paroxysmal atrial fibrillation: Secondary | ICD-10-CM | POA: Insufficient documentation

## 2024-10-02 DIAGNOSIS — I2510 Atherosclerotic heart disease of native coronary artery without angina pectoris: Secondary | ICD-10-CM | POA: Insufficient documentation

## 2024-10-02 DIAGNOSIS — I459 Conduction disorder, unspecified: Secondary | ICD-10-CM | POA: Diagnosis present

## 2024-10-02 DIAGNOSIS — I509 Heart failure, unspecified: Secondary | ICD-10-CM

## 2024-10-02 NOTE — Patient Instructions (Signed)
 Medication Instructions:   Your physician recommends that you continue on your current medications as directed. Please refer to the Current Medication list given to you today.    *If you need a refill on your cardiac medications before your next appointment, please call your pharmacy*  Lab Work:  None ordered at this time   If you have labs (blood work) drawn today and your tests are completely normal, you will receive your results only by:  MyChart Message (if you have MyChart) OR  A paper copy in the mail If you have any lab test that is abnormal or we need to change your treatment, we will call you to review the results.  Testing/Procedures:  None ordered at this time   Referrals:  Your cardiologist has referred you to Physical Therapy with [facility] at [facility]  We have attached their office location and phone number below.  Please allow them 3-5 business days to reach out to you to make an appointment.  If you have not heard from their office within that time, please call them to schedule your appointment.    Follow-Up:  At [facility] you and your health needs are our priority.  As part of our continuing mission to provide you with exceptional heart care, our providers are all part of one team.  This team includes your primary Cardiologist (physician) and Advanced Practice Providers or APPs (Physician Assistants and Nurse Practitioners) who all work together to provide you with the care you need, when you need it.  Your next appointment:   5 - 6 month(s)  Provider:    Evalene Lunger, MD or Barnie Hila, NP    We recommend signing up for the patient portal called MyChart.  Sign up information is provided on this After Visit Summary.  MyChart is used to connect with patients for Virtual Visits (Telemedicine).  Patients are able to view lab/test results, encounter notes, upcoming appointments, etc.  Non-urgent messages can be sent to your provider as well.    To learn more about what you can do with MyChart, go to [email or web address].

## 2024-10-03 ENCOUNTER — Ambulatory Visit: Payer: Self-pay | Admitting: Cardiology

## 2024-10-03 LAB — BASIC METABOLIC PANEL WITH GFR
BUN/Creatinine Ratio: 15 (ref 10–24)
BUN: 16 mg/dL (ref 8–27)
CO2: 24 mmol/L (ref 20–29)
Calcium: 9 mg/dL (ref 8.6–10.2)
Chloride: 102 mmol/L (ref 96–106)
Creatinine, Ser: 1.04 mg/dL (ref 0.76–1.27)
Glucose: 103 mg/dL — ABNORMAL HIGH (ref 70–99)
Potassium: 3.4 mmol/L — ABNORMAL LOW (ref 3.5–5.2)
Sodium: 145 mmol/L — ABNORMAL HIGH (ref 134–144)
eGFR: 70 mL/min/1.73

## 2024-10-05 ENCOUNTER — Telehealth: Payer: Self-pay

## 2024-10-05 NOTE — Telephone Encounter (Signed)
 Labs corrected. Marked out and initialed.

## 2024-10-05 NOTE — Telephone Encounter (Signed)
 Copied from CRM #[id number]. Topic: Clinical - Lab/Test Results >> Oct 02, 2024  3:07 PM Graeme ORN wrote: Reason for CRM: Patient wife called. States They are at [facility]. Patient has upcoming appt there 1/19 for blood work. She says he went to cardiologists today and had routine bloodwork. Wants to leave message for Amy to check labs that were done today to make sure none are duplicated at next appt. Thank You

## 2024-10-05 NOTE — Telephone Encounter (Signed)
 Gil Greig BRAVO, NP to Renella Keen, CMA  Hicks Feick, [place] LABOR, CMA (Selected Message)    10/05/24  9:48 AM Donzell, he now just needs A1c for labwork. Can you fix his lab sheet?

## 2024-10-05 NOTE — Telephone Encounter (Signed)
-----   Message from Suzann Riddle, NP sent at 10/03/2024  1:29 PM EST ----- Potassium is improved, but still remains low. Our goal is 4-5.  Please confirm he is taking 40meq (2 tabs) daily). If so, let's increase to 40 BID.

## 2024-10-05 NOTE — Telephone Encounter (Signed)
 I left a voicemail for Mr. Deckman asking that he call back for lab results and a medication change.  As per Suzann Riddle, NP, Potassium is improved, but still remains low. Our goal is 4-5.  Please confirm he is taking 40meq (2 tabs) daily). If so, let's increase to 40 BID.

## 2024-10-06 NOTE — Telephone Encounter (Signed)
-----   Message from Suzann Riddle, NP sent at 10/03/2024  1:29 PM EST ----- Potassium is improved, but still remains low. Our goal is 4-5.  Please confirm he is taking 40meq (2 tabs) daily). If so, let's increase to 40 BID.

## 2024-10-06 NOTE — Telephone Encounter (Signed)
 Second attempt to reach Mr. Dredge in regards to lab results and a medication change. Voice mail left on home phone.

## 2024-10-06 NOTE — Telephone Encounter (Signed)
 Patient wife is returning a call to be called back after 1pm  [phone]

## 2024-10-06 NOTE — Telephone Encounter (Signed)
 I spoke with Kyle Morales in regards to lab results and a medication change. As per Suzann Riddle, NP, Potassium is improved, but still remains low. Our goal is 4-5. Please confirm he is taking 40meq (2 tabs) daily). If so, let's increase to 40 BID. Patient states that his Potassium is 20 meq but will increase to taking 40 meq twice a day. Patient verbalized understanding and all questions answered at this time. Suzann Riddle, NP notified.

## 2024-10-13 LAB — HEMOGLOBIN A1C
Hgb A1c MFr Bld: 5.7 % — ABNORMAL HIGH
Mean Plasma Glucose: 117 mg/dL
eAG (mmol/L): 6.5 mmol/L

## 2024-10-15 ENCOUNTER — Ambulatory Visit: Payer: Self-pay | Admitting: Orthopedic Surgery

## 2024-10-16 ENCOUNTER — Non-Acute Institutional Stay: Payer: Self-pay | Admitting: Orthopedic Surgery

## 2024-10-16 ENCOUNTER — Encounter: Payer: Self-pay | Admitting: Orthopedic Surgery

## 2024-10-16 VITALS — BP 118/68 | HR 81 | Temp 97.2°F | Ht 71.0 in | Wt 166.0 lb

## 2024-10-16 DIAGNOSIS — N401 Enlarged prostate with lower urinary tract symptoms: Secondary | ICD-10-CM

## 2024-10-16 DIAGNOSIS — Z950 Presence of cardiac pacemaker: Secondary | ICD-10-CM | POA: Diagnosis not present

## 2024-10-16 DIAGNOSIS — J449 Chronic obstructive pulmonary disease, unspecified: Secondary | ICD-10-CM

## 2024-10-16 DIAGNOSIS — R7303 Prediabetes: Secondary | ICD-10-CM | POA: Diagnosis not present

## 2024-10-16 DIAGNOSIS — I5032 Chronic diastolic (congestive) heart failure: Secondary | ICD-10-CM

## 2024-10-16 DIAGNOSIS — I482 Chronic atrial fibrillation, unspecified: Secondary | ICD-10-CM | POA: Diagnosis not present

## 2024-10-16 DIAGNOSIS — R2681 Unsteadiness on feet: Secondary | ICD-10-CM

## 2024-10-16 NOTE — Progress Notes (Signed)
 " Location:  Other Nursing Home Room Number: [facility] Place of Service:  Clinic [phone]) Provider:  Greig FORBES Cluster, NP   Cluster Greig FORBES, NP  Patient Care Team: Cluster Greig FORBES, NP as PCP - General (Adult Health Nurse Practitioner) Perla Evalene PARAS, MD as PCP - Cardiology (Cardiology) Kennyth Chew, MD as PCP - Electrophysiology (Cardiology) Perla Evalene PARAS, MD as Consulting Physician (Cardiology) Maree Genell SAILOR, MD as Referring Physician (Otolaryngology) Francisca Redell BROCKS, MD as Consulting Physician (Urology) Alpha Chloe SAUNDERS, MD as Referring Physician (Dermatology) Pa, [facility]) Isadora Hose, MD as Consulting Physician (Pulmonary Disease) Riddle, Suzann, NP as Nurse Practitioner (Clinical Cardiac Electrophysiology) Loistine Sober, NP as Nurse Practitioner (Cardiology)  Extended Emergency Contact Information Primary Emergency Contact: Osolin-Manka,Marriana Address: [address]          [place], [place] [address] [place] Home Phone: [phone] Relation: Spouse  Code Status:  Full code  Goals of care: Advanced Directive information    09/07/2024    3:20 PM  Advanced Directives  Does Patient Have a Medical Advance Directive? Yes  Type of Advance Directive Living will  Does patient want to make changes to medical advance directive? No - Patient declined     Chief Complaint  Patient presents with   Medical Management of Chronic Issues    Medical Management of Chronic Issues. 3 Month follow up    HPI:  Pt is a 89 y.o. male seen today for medical management of chronic diseases.    Discussed the use of AI scribe software for clinical note transcription with the patient, who gave verbal consent to proceed.  History of Present Illness   Kyle Morales is an 89 year old male with prediabetes and balance issues who presents for follow-up on his urinary symptoms and overall health management.  He experiences difficulty initiating  urination, describing it as sometimes painful before starting, but once started, urination is fine. No frequent urination or incomplete bladder emptying. He is currently taking finasteride  and tamsulosin  and reports maintaining good hydration.  He has a history of prediabetes with an A1c of 5.7, which has remained stable for a significant period. He was previously on high doses of steroids for allergies, which initially elevated his blood sugar levels. He uses an inhaler almost daily, which contains a steroid that may slightly increase blood sugar levels.  He experiences balance issues and is currently undergoing physical therapy. No recent falls or injuries.  He has a history of atrial fibrillation and heart failure, with a heart rate typically under 100, though it can rise to 103. He has a pacemaker and is scheduled for a check-up next month. His cardiology follow-up is scheduled for six months from now.  He reports a cough with mostly opaque phlegm, sometimes yellow, but not producing any concerning phlegm. He was previously treated with antibiotics and prednisone  in December.  He mentions a tremor that affects his left hand, which interferes with activities like sewing. He notes that changing positions can alleviate the tremor, but it is starting to affect his right hand as well.     Past Medical History:  Diagnosis Date   Aortic atherosclerosis    Aortic root dilation    a.) TTE [id number]: Ao root measured 39 mm.   BPH (benign prostatic hyperplasia)    CHF (congestive heart failure) (HCC)    Coronary artery disease    a.) remote PCI of the RI in 1997; stent (unknown type)  placed.   Depression    Diverticulosis    Eczema    GERD (gastroesophageal reflux disease)    Hyperlipidemia    Hypertension    Inguinal hernia, left    Long term current use of anticoagulant    a.) apixaban    Nephrolithiasis    NSVT (nonsustained ventricular tachycardia) (HCC)    Osteoarthrosis    Paroxysmal  A-fib (HCC)    a.) CHA2DS2VASc = 5 (age x2, CHF, HTN, vascular disease history);  b.) rate/rhythm maintained on oral metoprolol  succinate; chronically anticoagulated with apixaban    Pharyngeal dysphagia    Presence of permanent cardiac pacemaker 2007   a.) Medtronic device placed in [place]  in 2007; b.) generator changed at [facility] on 11/18/2015   RBBB    SSS (sick sinus syndrome) (HCC) 2007   a.) s/p PPM placement in [place]  in 2007   Past Surgical History:  Procedure Laterality Date   BLEPHAROPLASTY Bilateral    CARDIAC CATHETERIZATION  09/25/1995   CARDIAC CATHETERIZATION  09/24/1998   CHOLECYSTECTOMY     COLONOSCOPY  09/24/2010   CORONARY ANGIOPLASTY  09/25/1995   ramus   ESOPHAGOGASTRODUODENOSCOPY N/A 02/11/2017   Procedure: ESOPHAGOGASTRODUODENOSCOPY (EGD);  Surgeon: Gaylyn Gladis PENNER, MD;  [facility] ENDOSCOPY;  Service: Endoscopy;  Laterality: N/A;   INGUINAL HERNIA REPAIR Left 1977   INSERT / REPLACE / REMOVE PACEMAKER  12/23/2005   Medtronic [id number] - placed in [place]    INSERTION OF MESH Left 06/08/2022   Procedure: INSERTION OF MESH;  Surgeon: Lane Shope, MD;  Location: [facility] ORS;  Service: General;  Laterality: Left;   MANDIBLE SURGERY  1966   PACEMAKER GENERATOR CHANGE N/A 11/18/2015   ROBOTIC ASSISTED LAPAROSCOPIC CHOLECYSTECTOMY  01/12/2022   TONSILLECTOMY     TOTAL HIP ARTHROPLASTY Right 2022   TOTAL SHOULDER REPLACEMENT     bilateral approx 2020 and 2021    Allergies[1]  Outpatient Encounter Medications as of 10/16/2024  Medication Sig   acetaminophen  (TYLENOL ) 500 MG tablet Take 500 mg by mouth every 8 (eight) hours as needed.   albuterol  (VENTOLIN  HFA) 108 (90 Base) MCG/ACT inhaler Inhale 2 puffs into the lungs every 6 (six) hours as needed for wheezing or shortness of breath.   bisoprolol  (ZEBETA ) 5 MG tablet Take 1 tablet (5 mg total) by mouth daily.   [employer] ZINC  50 MG TABS Take 50 mg by mouth daily.   ELIQUIS  5 MG TABS tablet TAKE 1 TABLET  TWICE A DAY   finasteride  (PROSCAR ) 5 MG tablet Take 1 tablet (5 mg total) by mouth daily.   fluticasone  (FLONASE ) 50 MCG/ACT nasal spray Place 2 sprays into both nostrils 2 (two) times daily.   furosemide  (LASIX ) 40 MG tablet Take 3 tablets (120 mg total) by mouth daily. 3 tablets daily alternating with 3 tablets twice daily.   levothyroxine  (SYNTHROID ) 50 MCG tablet Take 1 tablet (50 mcg total) by mouth daily.   lidocaine  (LIDODERM ) 5 % Place 1 patch onto the skin daily. Remove & Discard patch within 12 hours or as directed by MD   lovastatin  (MEVACOR ) 20 MG tablet TAKE 1 TABLET AT BEDTIME   nitroGLYCERIN  (NITROSTAT ) 0.4 MG SL tablet Place 1 tablet (0.4 mg total) under the tongue every 5 (five) minutes as needed for chest pain.   potassium chloride  SA (KLOR-CON  M) 20 MEQ tablet Take 2 tablets (40 mEq total) by mouth once.   sertraline  (ZOLOFT ) 100 MG tablet Take 1 tablet (100 mg total) by mouth daily.   tamsulosin  (FLOMAX ) 0.4 MG  CAPS capsule Take 2 capsules (0.8 mg total) by mouth daily after breakfast.   TRELEGY ELLIPTA  100-62.5-25 MCG/ACT AEPB Inhale 1 puff into the lungs daily.   Vitamin D , Ergocalciferol , (DRISDOL ) 1.25 MG (50000 UNIT) CAPS capsule Take 1 capsule (50,000 Units total) by mouth every 7 (seven) days.   No facility-administered encounter medications on file as of 10/16/2024.    Review of Systems  Constitutional: Negative.   HENT: Negative.    Eyes:  Negative for visual disturbance.  Respiratory:  Positive for cough. Negative for shortness of breath and wheezing.   Cardiovascular:  Negative for chest pain and leg swelling.  Gastrointestinal:  Negative for abdominal distention and abdominal pain.  Endocrine: Negative for polyuria.  Genitourinary:  Positive for dysuria. Negative for difficulty urinating, flank pain, frequency and hematuria.  Musculoskeletal:  Positive for gait problem.  Skin:  Negative for wound.  Neurological:  Positive for tremors and weakness. Negative  for dizziness and headaches.  Psychiatric/Behavioral:  Negative for confusion, dysphoric mood and sleep disturbance. The patient is not nervous/anxious.     Immunization History  Administered Date(s) Administered   Fluad Quad(high Dose 65+) 07/25/2021, 07/17/2022   H1N1 09/02/2008   INFLUENZA, HIGH DOSE SEASONAL PF 06/11/2024   Influenza, Seasonal, Injecte, Preservative Fre 08/01/2006, 09/11/2007, 06/10/2008, 07/28/2009, 06/08/2010, 07/12/2011, 08/05/2012, 07/07/2013   Influenza,inj,Quad PF,6+ Mos 08/05/2012, 06/14/2015, 06/22/2016, 08/05/2017, 07/01/2018, 06/09/2019, 06/02/2020   Influenza-Unspecified 06/13/2014, 07/25/2021, 07/17/2022, 07/18/2023   Moderna Covid-19 Fall Seasonal Vaccine 23yrs & older 08/03/2022, 01/01/2023   Moderna Covid-19 Vaccine Bivalent Booster 62yrs & up 06/15/2021, 02/20/2022   Moderna Sars-Covid-2 Vaccination 11/06/2019, 12/04/2019, 08/09/2020, 02/09/2021, 08/03/2022   Pfizer(Comirnaty)Fall Seasonal Vaccine 12 years and older 06/11/2024   Pneumococcal Conjugate-13 06/14/2015   Pneumococcal Polysaccharide-23 08/01/2006, 08/05/2012   RSV,unspecified 07/28/2022   Respiratory Syncytial Virus Vaccine,Recomb Aduvanted(Arexvy) 07/28/2022   Tdap 03/01/2023   Unspecified SARS-COV-2 Vaccination 06/21/2023, 01/03/2024   Zoster Recombinant(Shingrix) 08/07/2019, 10/09/2019   Pertinent  Health Maintenance Due  Topic Date Due   Influenza Vaccine  Completed      05/13/2024    4:02 PM 06/18/2024   11:02 AM 08/31/2024    3:41 PM 09/07/2024    3:20 PM 10/16/2024   10:39 AM  Fall Risk  Falls in the past year? 0 0 1 0 0  Was there an injury with Fall? 0  0  0 0 0  Fall Risk Category Calculator 0 0 1 0 0  Patient at Risk for Falls Due to No Fall Risks No Fall Risks Impaired balance/gait Impaired balance/gait No Fall Risks  Fall risk Follow up Falls evaluation completed Falls evaluation completed Falls evaluation completed Falls evaluation completed Falls evaluation completed      Data saved with a previous flowsheet row definition   Functional Status Survey:    Vitals:   10/16/24 1037  BP: 118/68  Pulse: 81  Temp: (!) 97.2 F (36.2 C)  SpO2: 95%  Weight: 166 lb (75.3 kg)  Height: 5' 11 (1.803 m)   Body mass index is 23.15 kg/m. Physical Exam Vitals reviewed.  Constitutional:      General: He is not in acute distress. HENT:     Head: Normocephalic.  Eyes:     General:        Right eye: No discharge.        Left eye: No discharge.  Cardiovascular:     Rate and Rhythm: Normal rate and regular rhythm.     Pulses: Normal pulses.     Heart  sounds: Normal heart sounds.  Pulmonary:     Effort: No respiratory distress.     Breath sounds: Normal breath sounds. No wheezing or rales.  Abdominal:     General: Bowel sounds are normal. There is no distension.     Palpations: Abdomen is soft.     Tenderness: There is no abdominal tenderness.  Musculoskeletal:     Cervical back: Neck supple.     Right lower leg: No edema.     Left lower leg: No edema.  Skin:    General: Skin is warm.     Capillary Refill: Capillary refill takes less than 2 seconds.  Neurological:     General: No focal deficit present.     Mental Status: He is alert and oriented to person, place, and time.     Gait: Gait abnormal.     Comments: Left hand tremor  Psychiatric:        Mood and Affect: Mood normal.     Labs reviewed: Recent Labs    07/06/24 1131 09/04/24 1519 10/02/24 1015  NA 140 140 145*  K 3.5 3.2* 3.4*  CL 102 102 102  CO2 29 26 24   GLUCOSE 101* 114* 103*  BUN 17 22 16   CREATININE 1.14 0.95 1.04  CALCIUM 9.5 9.2 9.0   Recent Labs    10/31/23 0750 04/30/24 0735  AST 15 21  ALT 12 15  BILITOT 0.7 0.9  PROT 6.4 6.6   Recent Labs    10/31/23 0750 04/30/24 0735 09/04/24 1519  WBC 5.3 5.5 9.6  NEUTROABS 2,979 3,157  --   HGB 15.5 14.7 15.1  HCT 46.5 44.1 43.5  MCV 99.8 103.0* 98.0  PLT 158 162 189   Lab Results  Component Value Date    TSH 3.76 06/15/2024   Lab Results  Component Value Date   HGBA1C 5.7 (H) 10/12/2024   Lab Results  Component Value Date   CHOL 129 04/30/2024   HDL 40 04/30/2024   LDLCALC 68 04/30/2024   TRIG 128 04/30/2024   CHOLHDL 3.2 04/30/2024    Significant Diagnostic Results in last 30 days:  No results found.  Assessment/Plan 1. Chronic obstructive pulmonary disease, unspecified COPD type (HCC) (Primary) - no recent exacerbations - cont Trelegy and albuterol  prn  2. Chronic atrial fibrillation (HCC) - followed by cardiology - HR< 100 with bisoprolol   - cont Eliquis  for clot prevention  - CBC with Differential/Platelet  3. Unstable gait - no recent falls  -  furniture grabbing per wife - cont PT/OT  4. Benign prostatic hyperplasia with lower urinary tract symptoms, symptom details unspecified - mild discomfort initiating stream - cont finasteride  and tamsulosin    5. Cardiac pacemaker in situ  6. Prediabetes - A1c 5.7 - diet controlled - Basic Metabolic Panel with eGFR - Hemoglobin A1c  7. Chronic diastolic CHF (congestive heart failure) (HCC) - compensated - cont furosdmide/KCL   Family/ staff Communication: plan discussed with patient and nurse  Labs/tests ordered:  cbc/diff, bmp, A1c 04/12/2025 @ [facility]      [1]  Allergies Allergen Reactions   Amoxicillin Rash and Itching   Bactrim [Sulfamethoxazole-Trimethoprim] Rash   "

## 2024-10-16 NOTE — Patient Instructions (Addendum)
 Lab visit 07/20 @ 7:30 AM

## 2024-10-25 ENCOUNTER — Emergency Department
Admission: EM | Admit: 2024-10-25 | Discharge: 2024-10-25 | Disposition: A | Discharge status: Home / Self Care | Attending: Emergency Medicine | Admitting: Emergency Medicine

## 2024-10-25 ENCOUNTER — Other Ambulatory Visit: Payer: Self-pay

## 2024-10-25 ENCOUNTER — Emergency Department

## 2024-10-25 DIAGNOSIS — J449 Chronic obstructive pulmonary disease, unspecified: Secondary | ICD-10-CM | POA: Insufficient documentation

## 2024-10-25 DIAGNOSIS — N3000 Acute cystitis without hematuria: Secondary | ICD-10-CM | POA: Diagnosis not present

## 2024-10-25 DIAGNOSIS — R339 Retention of urine, unspecified: Secondary | ICD-10-CM | POA: Insufficient documentation

## 2024-10-25 DIAGNOSIS — R1030 Lower abdominal pain, unspecified: Secondary | ICD-10-CM | POA: Diagnosis present

## 2024-10-25 LAB — CBC
HCT: 43.6 % (ref 39.0–52.0)
Hemoglobin: 14.7 g/dL (ref 13.0–17.0)
MCH: 34 pg (ref 26.0–34.0)
MCHC: 33.7 g/dL (ref 30.0–36.0)
MCV: 100.9 fL — ABNORMAL HIGH (ref 80.0–100.0)
Platelets: 141 10*3/uL — ABNORMAL LOW (ref 150–400)
RBC: 4.32 MIL/uL (ref 4.22–5.81)
RDW: 12.7 % (ref 11.5–15.5)
WBC: 8.1 10*3/uL (ref 4.0–10.5)
nRBC: 0 % (ref 0.0–0.2)

## 2024-10-25 LAB — URINALYSIS, ROUTINE W REFLEX MICROSCOPIC
Bilirubin Urine: NEGATIVE
Glucose, UA: NEGATIVE mg/dL
Ketones, ur: NEGATIVE mg/dL
Nitrite: NEGATIVE
Protein, ur: 30 mg/dL — AB
Specific Gravity, Urine: 1.006 (ref 1.005–1.030)
Squamous Epithelial / HPF: 0 /HPF (ref 0–5)
pH: 6 (ref 5.0–8.0)

## 2024-10-25 LAB — TROPONIN T, HIGH SENSITIVITY
Troponin T High Sensitivity: 22 ng/L — ABNORMAL HIGH (ref 0–19)
Troponin T High Sensitivity: 21 ng/L — ABNORMAL HIGH (ref 0–19)

## 2024-10-25 LAB — BASIC METABOLIC PANEL WITH GFR
Anion gap: 14 (ref 5–15)
BUN: 13 mg/dL (ref 8–23)
CO2: 23 mmol/L (ref 22–32)
Calcium: 9.2 mg/dL (ref 8.9–10.3)
Chloride: 104 mmol/L (ref 98–111)
Creatinine, Ser: 0.95 mg/dL (ref 0.61–1.24)
GFR, Estimated: >60 mL/min
Glucose, Bld: 122 mg/dL — ABNORMAL HIGH (ref 70–99)
Potassium: 3.7 mmol/L (ref 3.5–5.1)
Sodium: 140 mmol/L (ref 135–145)

## 2024-10-25 LAB — HEPATIC FUNCTION PANEL
ALT: 14 U/L (ref 0–44)
AST: 23 U/L (ref 15–41)
Albumin: 4.4 g/dL (ref 3.5–5.0)
Alkaline Phosphatase: 61 U/L (ref 38–126)
Bilirubin, Direct: 0.2 mg/dL (ref 0.0–0.2)
Indirect Bilirubin: 0.4 mg/dL (ref 0.3–0.9)
Total Bilirubin: 0.6 mg/dL (ref 0.0–1.2)
Total Protein: 7 g/dL (ref 6.5–8.1)

## 2024-10-25 MED ORDER — CEFUROXIME AXETIL 500 MG PO TABS: 500.0000 mg | ORAL_TABLET | Freq: Two times a day (BID) | ORAL | 0 refills | Status: AC

## 2024-10-25 MED ORDER — KETOROLAC TROMETHAMINE 15 MG/ML IJ SOLN
15.0000 mg | Freq: Once | INTRAMUSCULAR | Status: AC
  Administered 2024-10-25: 15 mg via INTRAVENOUS
  Filled 2024-10-25: qty 1

## 2024-10-25 MED ORDER — OXYCODONE HCL 5 MG PO TABS
5.0000 mg | ORAL_TABLET | Freq: Once | ORAL | Status: AC
  Administered 2024-10-25: 5 mg via ORAL
  Filled 2024-10-25: qty 1

## 2024-10-25 MED ORDER — SODIUM CHLORIDE 0.9 % IV SOLN
2.0000 g | Freq: Once | INTRAVENOUS | Status: AC
  Administered 2024-10-25: 2 g via INTRAVENOUS
  Filled 2024-10-25: qty 20

## 2024-10-25 NOTE — ED Provider Notes (Addendum)
 "  [facility] Provider Note    Event Date/Time   First MD Initiated Contact with Patient 10/25/24 1215     (approximate)   History   Flank Pain   HPI  Kyle Morales is a 89 y.o. male with COPD who comes in with groin and penile pain since 3 AM with difficulties urinating.  Patient reports having history of this previously.  Patient reports that he has had a history of kidney stones but this feels similarly.  He reports pain in the lower left side into his penis.  He denied any chest pain, shortness of breath or any other concerns.  Orts that he cannot get his urine out.  Unclear why troponin was added on from triage as patient did not report any chest pain or shortness of breath.  Physical Exam   Triage Vital Signs: ED Triage Vitals  Encounter Vitals Group     BP 10/25/24 1004 138/82     Girls Systolic BP Percentile --      Girls Diastolic BP Percentile --      Boys Systolic BP Percentile --      Boys Diastolic BP Percentile --      Pulse Rate 10/25/24 1004 73     Resp 10/25/24 1004 18     Temp 10/25/24 1004 98 F (36.7 C)     Temp src --      SpO2 10/25/24 1004 94 %     Weight 10/25/24 1003 166 lb (75.3 kg)     Height 10/25/24 1003 5' 11 (1.803 m)     Head Circumference --      Peak Flow --      Pain Score 10/25/24 1003 8     Pain Loc --      Pain Education --      Exclude from Growth Chart --     Most recent vital signs: Vitals:   10/25/24 1004  BP: 138/82  Pulse: 73  Resp: 18  Temp: 98 F (36.7 C)  SpO2: 94%     General: Awake, no distress.  CV:  Good peripheral perfusion.  Resp:  Normal effort.  Abd:  No distention.  Testicles with no swelling or tenderness noted.  Normal penile shaft.  He is got some suprapubic tenderness with pain reported in his penis without any redness or warmth noted.  No redness skin changes underneath his testicles. Other:     ED Results / Procedures / Treatments   Labs (all labs ordered are  listed, but only abnormal results are displayed) Labs Reviewed  CBC - Abnormal; Notable for the following components:      Result Value   MCV 100.9 (*)    Platelets 141 (*)    All other components within normal limits  BASIC METABOLIC PANEL WITH GFR - Abnormal; Notable for the following components:   Glucose, Bld 122 (*)    All other components within normal limits  TROPONIN T, HIGH SENSITIVITY - Abnormal; Notable for the following components:   Troponin T High Sensitivity 22 (*)    All other components within normal limits  HEPATIC FUNCTION PANEL  URINALYSIS, ROUTINE W REFLEX MICROSCOPIC      RADIOLOGY I have reviewed the ct personally and interpreted positive distended bladder but no obvious kidney stone   PROCEDURES:  Critical Care performed: No  Procedures   MEDICATIONS ORDERED IN ED: Medications - No data to display   IMPRESSION / MDM / ASSESSMENT AND PLAN /  ED COURSE  I reviewed the triage vital signs and the nursing notes.   Patient's presentation is most consistent with acute presentation with potential threat to life or bodily function.   Patient came in with penile pain some suprapubic fullness.  CT imaging ordered from triage to make sure no evidence of kidney stone.  Unclear why a troponin was added on as patient denied any chest pain or shortness of breath but the initial was slightly elevated so I did get a second 1 and it was flat from 22-21 so given no chest pain no indication for further workup of this.    IMPRESSION: 1. No acute findings. No hydronephrosis or ureteral calculus. 2. Several nonobstructive renal calculi bilaterally, with the largest measuring 12 x 10 mm in the right kidney. Ureters are normal in caliber and free of calculi.  However CT imaging did show that his bladder was slightly distended and the bladder scan was not working and he was having some significant pain so I recommended putting a Foley in.  With Foley placement he had  significant relief in symptoms with 500 cc of urine out.  Discussed with him since CT results of no kidney stones I am concerned the patient probably has UTI leading to some mild urinary retention.  We discussed taking the Foley out but given he had relief of symptoms with Foley placement and a good amount of urine output they were okay with leaving Foley in until he can follow-up outpatient with urology.  He does have a history of enlarged prostate that he is on treatment for and follows with urology at [facility] but they do report seeing Dr. Francisca once here and would like to follow-up with him instead.  I will give a dose of ceftriaxone  to cover for UTI.  Will send for urine culture.  He has no new back pain or leg weakness to suggest cord compression he does report history of enlarged prostate suspect related to this versus UTI low suspicion cord compression  No prior cultures so will start on cefuroxime .  2:29 PM repeat assessment patient's abdomen soft and nontender testicles are reexamined he is got no pain.  He has resolution of symptoms he feels comfortable with discharge home and following up outpatient with urology.  Foley care was taught to wife and leg bag provided.     FINAL CLINICAL IMPRESSION(S) / ED DIAGNOSES   Final diagnoses:  Acute cystitis without hematuria  Urinary retention     Rx / DC Orders   ED Discharge Orders          Ordered    cefUROXime  (CEFTIN ) 500 MG tablet  2 times daily with meals        10/25/24 1428             Note:  This document was prepared using Dragon voice recognition software and may include unintentional dictation errors.   Ernest Ronal BRAVO, MD 10/25/24 1428    Ernest Ronal BRAVO, MD 10/25/24 1430  "

## 2024-10-25 NOTE — ED Triage Notes (Signed)
 Pt comes via EMS from home with c/o pain in penis and groin area since 3am. Pt states trouble urinating. Pt stats hx of this.   VSS

## 2024-10-25 NOTE — Discharge Instructions (Addendum)
 We are starting him on antibiotics due to concerns for UTI.  I think that this caused him to have some urinary retention which is why we placed a Foley catheter.  Please call urology to make a follow-up appointment within the next 1 to 2 weeks to try to have the Foley catheter removed.  Return to the ER for fevers, worsening symptoms or any other concerns  IMPRESSION: 1. No acute findings. No hydronephrosis or ureteral calculus. 2. Several nonobstructive renal calculi bilaterally, with the largest measuring 12 x 10 mm in the right kidney. Ureters are normal in caliber and free of calculi.

## 2024-10-27 ENCOUNTER — Telehealth: Payer: Self-pay

## 2024-10-27 ENCOUNTER — Ambulatory Visit: Payer: Medicare Other

## 2024-10-28 LAB — CUP PACEART REMOTE DEVICE CHECK
Battery Remaining Longevity: 10 mo
Battery Voltage: 2.88 V
Brady Statistic AP VP Percent: 3.79 %
Brady Statistic AP VS Percent: 79.63 %
Brady Statistic AS VP Percent: 1.22 %
Brady Statistic AS VS Percent: 15.36 %
Brady Statistic RA Percent Paced: 81.85 %
Brady Statistic RV Percent Paced: 5.14 %
Date Time Interrogation Session: 20260203123035
Implantable Lead Connection Status: 753985
Implantable Lead Connection Status: 753985
Implantable Lead Implant Date: 20070411
Implantable Lead Implant Date: 20070411
Implantable Lead Location: 753859
Implantable Lead Location: 753860
Implantable Lead Model: 5076
Implantable Lead Model: 5076
Implantable Pulse Generator Implant Date: 20170224
Lead Channel Impedance Value: 342 Ohm
Lead Channel Impedance Value: 361 Ohm
Lead Channel Impedance Value: 418 Ohm
Lead Channel Impedance Value: 456 Ohm
Lead Channel Pacing Threshold Amplitude: 0.875 V
Lead Channel Pacing Threshold Amplitude: 1.25 V
Lead Channel Pacing Threshold Pulse Width: 0.4 ms
Lead Channel Pacing Threshold Pulse Width: 0.4 ms
Lead Channel Sensing Intrinsic Amplitude: 1 mV
Lead Channel Sensing Intrinsic Amplitude: 1 mV
Lead Channel Sensing Intrinsic Amplitude: 7.125 mV
Lead Channel Sensing Intrinsic Amplitude: 7.125 mV
Lead Channel Setting Pacing Amplitude: 2 V
Lead Channel Setting Pacing Amplitude: 2.5 V
Lead Channel Setting Pacing Pulse Width: 0.4 ms
Lead Channel Setting Sensing Sensitivity: 0.9 mV
Zone Setting Status: 755011

## 2024-10-28 LAB — URINE CULTURE: Culture: 80000 — AB

## 2024-10-29 NOTE — Progress Notes (Addendum)
 ED Antimicrobial Stewardship Positive Culture Follow Up   Kyle Morales is an 89 y.o. male who presented to [facility] on 10/25/2024 with a chief complaint of  Chief Complaint  Patient presents with   Flank Pain    Recent Results (from the past 720 hours)  Urine Culture     Status: Abnormal   Collection Time: 10/25/24 10:06 AM   Specimen: Urine, Clean Catch  Result Value Ref Range Status   Specimen Description   Final    URINE, CLEAN CATCH Performed at [facility], [address], [place], [place] [address]    Special Requests   Final    NONE Performed at [facility], [address] [place], [place] [address]    Culture 80,000 COLONIES/mL ENTEROCOCCUS FAECALIS (A)  Final   Report Status 10/28/2024 FINAL  Final   Organism ID, Bacteria ENTEROCOCCUS FAECALIS (A)  Final      Susceptibility   Enterococcus faecalis - MIC*    AMPICILLIN <=2 SENSITIVE Sensitive     NITROFURANTOIN <=16 SENSITIVE Sensitive     VANCOMYCIN 1 SENSITIVE Sensitive     * 80,000 COLONIES/mL ENTEROCOCCUS FAECALIS    [x]  Treated with cefuroxime  500 mg tab, organism resistant to prescribed antimicrobial  Patient discharged from [facility] ED on 2/1. Urine cultures came back positive for Enterococcus faecalis, which is resistant to cefuroxime . Discussed with ED provider, and planned to change antibiotics to nitrofurantoin. Spoke with patient, but patient wanted to touch base with PCP Darra Cluster, NP) first before changing antibiotics. Amy Cluster was contacted via secure chat regarding the ED urine culture update and subsequent ED provider plan. PCP agreeable to ED provider's plan.  Afterwards, followed up with patient who was now agreeable to new antibiotic plan to stop cefuroxime  and start nitrofurantoin. Called in prescription to [facility] [place] on [address]  New antibiotic prescription: recommended nitrofurantoin 100 mg BID x 5 days  ED Provider: Dr. Jacolyn Leonor [name] Viviana,  PharmD Pharmacy Resident  10/29/2024 12:56 PM

## 2024-11-10 ENCOUNTER — Ambulatory Visit: Admitting: Physician Assistant

## 2024-11-12 ENCOUNTER — Ambulatory Visit: Admitting: Cardiology

## 2024-11-23 ENCOUNTER — Ambulatory Visit: Admitting: Podiatry

## 2025-01-26 ENCOUNTER — Ambulatory Visit: Payer: Medicare Other

## 2025-03-18 ENCOUNTER — Ambulatory Visit: Admitting: Student

## 2025-04-16 ENCOUNTER — Encounter: Admitting: Orthopedic Surgery

## 2025-04-27 ENCOUNTER — Ambulatory Visit: Payer: Medicare Other

## 2025-07-27 ENCOUNTER — Ambulatory Visit: Payer: Medicare Other

## 2025-10-26 ENCOUNTER — Ambulatory Visit: Payer: Medicare Other

## 2026-01-25 ENCOUNTER — Ambulatory Visit: Payer: Medicare Other

## 2026-04-26 ENCOUNTER — Ambulatory Visit: Payer: Medicare Other
